# Patient Record
Sex: Male | Born: 1965 | Race: White | Marital: Single | Smoking: Former smoker
Health system: Southern US, Community
[De-identification: ages and names within clinical notes are randomized; demographics above are authoritative.]

## PROBLEM LIST (undated history)

## (undated) DIAGNOSIS — IMO0002 Reserved for concepts with insufficient information to code with codable children: Secondary | ICD-10-CM

## (undated) DIAGNOSIS — G43909 Migraine, unspecified, not intractable, without status migrainosus: Secondary | ICD-10-CM

## (undated) DIAGNOSIS — I10 Essential (primary) hypertension: Secondary | ICD-10-CM

## (undated) DIAGNOSIS — Z5189 Encounter for other specified aftercare: Secondary | ICD-10-CM

## (undated) DIAGNOSIS — K219 Gastro-esophageal reflux disease without esophagitis: Secondary | ICD-10-CM

## (undated) DIAGNOSIS — R55 Syncope and collapse: Secondary | ICD-10-CM

## (undated) DIAGNOSIS — Z973 Presence of spectacles and contact lenses: Secondary | ICD-10-CM

## (undated) DIAGNOSIS — E785 Hyperlipidemia, unspecified: Secondary | ICD-10-CM

## (undated) DIAGNOSIS — M1990 Unspecified osteoarthritis, unspecified site: Secondary | ICD-10-CM

## (undated) DIAGNOSIS — T7840XA Allergy, unspecified, initial encounter: Secondary | ICD-10-CM

## (undated) HISTORY — PX: BACK SURGERY: SHX140

## (undated) HISTORY — DX: Hyperlipidemia, unspecified: E78.5

## (undated) HISTORY — DX: Unspecified osteoarthritis, unspecified site: M19.90

## (undated) HISTORY — DX: Migraine, unspecified, not intractable, without status migrainosus: G43.909

## (undated) HISTORY — DX: Gastro-esophageal reflux disease without esophagitis: K21.9

## (undated) HISTORY — PX: COLONOSCOPY: SHX174

## (undated) HISTORY — DX: Encounter for other specified aftercare: Z51.89

## (undated) HISTORY — DX: Essential (primary) hypertension: I10

## (undated) HISTORY — DX: Syncope and collapse: R55

## (undated) HISTORY — PX: SPINE SURGERY: SHX786

## (undated) HISTORY — DX: Reserved for concepts with insufficient information to code with codable children: IMO0002

## (undated) HISTORY — PX: APPENDECTOMY: SHX54

## (undated) HISTORY — PX: KNEE SURGERY: SHX244

## (undated) HISTORY — DX: Allergy, unspecified, initial encounter: T78.40XA

## (undated) HISTORY — PX: RETINAL DETACHMENT SURGERY: SHX105

## (undated) HISTORY — PX: TOTAL SHOULDER ARTHROPLASTY: SHX126

## (undated) HISTORY — PX: EYE SURGERY: SHX253

## (undated) HISTORY — PX: JOINT REPLACEMENT: SHX530

## (undated) HISTORY — PX: HERNIA REPAIR: SHX51

## (undated) HISTORY — PX: SHOULDER SURGERY: SHX246

## (undated) HISTORY — PX: BRAIN SURGERY: SHX531

## (undated) HISTORY — PX: OTHER SURGICAL HISTORY: SHX169

---

## 2013-07-01 DIAGNOSIS — F909 Attention-deficit hyperactivity disorder, unspecified type: Secondary | ICD-10-CM | POA: Diagnosis not present

## 2013-07-01 DIAGNOSIS — Z981 Arthrodesis status: Secondary | ICD-10-CM | POA: Diagnosis not present

## 2013-07-01 DIAGNOSIS — I10 Essential (primary) hypertension: Secondary | ICD-10-CM | POA: Diagnosis not present

## 2013-07-01 DIAGNOSIS — G43909 Migraine, unspecified, not intractable, without status migrainosus: Secondary | ICD-10-CM | POA: Diagnosis present

## 2013-07-01 DIAGNOSIS — M47817 Spondylosis without myelopathy or radiculopathy, lumbosacral region: Secondary | ICD-10-CM | POA: Diagnosis present

## 2013-07-01 DIAGNOSIS — M545 Low back pain: Secondary | ICD-10-CM | POA: Diagnosis present

## 2013-07-01 DIAGNOSIS — M5126 Other intervertebral disc displacement, lumbar region: Secondary | ICD-10-CM | POA: Diagnosis present

## 2013-07-01 DIAGNOSIS — K219 Gastro-esophageal reflux disease without esophagitis: Secondary | ICD-10-CM | POA: Diagnosis present

## 2013-07-01 DIAGNOSIS — G8929 Other chronic pain: Secondary | ICD-10-CM | POA: Diagnosis not present

## 2013-10-04 DIAGNOSIS — R209 Unspecified disturbances of skin sensation: Secondary | ICD-10-CM | POA: Diagnosis not present

## 2013-10-04 DIAGNOSIS — M545 Low back pain: Secondary | ICD-10-CM | POA: Diagnosis not present

## 2013-10-04 DIAGNOSIS — M25519 Pain in unspecified shoulder: Secondary | ICD-10-CM | POA: Diagnosis not present

## 2013-10-04 DIAGNOSIS — IMO0001 Reserved for inherently not codable concepts without codable children: Secondary | ICD-10-CM | POA: Diagnosis not present

## 2013-10-04 DIAGNOSIS — R5381 Other malaise: Secondary | ICD-10-CM | POA: Diagnosis not present

## 2013-10-04 DIAGNOSIS — R6889 Other general symptoms and signs: Secondary | ICD-10-CM | POA: Diagnosis not present

## 2013-10-04 DIAGNOSIS — R262 Difficulty in walking, not elsewhere classified: Secondary | ICD-10-CM | POA: Diagnosis not present

## 2013-10-04 DIAGNOSIS — R439 Unspecified disturbances of smell and taste: Secondary | ICD-10-CM | POA: Diagnosis not present

## 2013-10-07 DIAGNOSIS — M25519 Pain in unspecified shoulder: Secondary | ICD-10-CM | POA: Diagnosis not present

## 2013-10-07 DIAGNOSIS — M545 Low back pain: Secondary | ICD-10-CM | POA: Diagnosis not present

## 2013-10-07 DIAGNOSIS — IMO0001 Reserved for inherently not codable concepts without codable children: Secondary | ICD-10-CM | POA: Diagnosis not present

## 2013-10-07 DIAGNOSIS — R262 Difficulty in walking, not elsewhere classified: Secondary | ICD-10-CM | POA: Diagnosis not present

## 2013-10-07 DIAGNOSIS — R5381 Other malaise: Secondary | ICD-10-CM | POA: Diagnosis not present

## 2013-10-07 DIAGNOSIS — R6889 Other general symptoms and signs: Secondary | ICD-10-CM | POA: Diagnosis not present

## 2013-10-09 DIAGNOSIS — R6889 Other general symptoms and signs: Secondary | ICD-10-CM | POA: Diagnosis not present

## 2013-10-09 DIAGNOSIS — M545 Low back pain: Secondary | ICD-10-CM | POA: Diagnosis not present

## 2013-10-09 DIAGNOSIS — IMO0001 Reserved for inherently not codable concepts without codable children: Secondary | ICD-10-CM | POA: Diagnosis not present

## 2013-10-09 DIAGNOSIS — R5381 Other malaise: Secondary | ICD-10-CM | POA: Diagnosis not present

## 2013-10-09 DIAGNOSIS — R262 Difficulty in walking, not elsewhere classified: Secondary | ICD-10-CM | POA: Diagnosis not present

## 2013-10-09 DIAGNOSIS — M25519 Pain in unspecified shoulder: Secondary | ICD-10-CM | POA: Diagnosis not present

## 2013-10-10 DIAGNOSIS — M545 Low back pain: Secondary | ICD-10-CM | POA: Diagnosis not present

## 2013-10-10 DIAGNOSIS — R262 Difficulty in walking, not elsewhere classified: Secondary | ICD-10-CM | POA: Diagnosis not present

## 2013-10-10 DIAGNOSIS — IMO0001 Reserved for inherently not codable concepts without codable children: Secondary | ICD-10-CM | POA: Diagnosis not present

## 2013-10-10 DIAGNOSIS — R5381 Other malaise: Secondary | ICD-10-CM | POA: Diagnosis not present

## 2013-10-10 DIAGNOSIS — R6889 Other general symptoms and signs: Secondary | ICD-10-CM | POA: Diagnosis not present

## 2013-10-10 DIAGNOSIS — M25519 Pain in unspecified shoulder: Secondary | ICD-10-CM | POA: Diagnosis not present

## 2013-10-24 DIAGNOSIS — T50902A Poisoning by unspecified drugs, medicaments and biological substances, intentional self-harm, initial encounter: Secondary | ICD-10-CM | POA: Diagnosis not present

## 2013-10-24 DIAGNOSIS — T50901A Poisoning by unspecified drugs, medicaments and biological substances, accidental (unintentional), initial encounter: Secondary | ICD-10-CM | POA: Diagnosis not present

## 2013-10-24 DIAGNOSIS — F329 Major depressive disorder, single episode, unspecified: Secondary | ICD-10-CM | POA: Diagnosis not present

## 2013-10-24 DIAGNOSIS — T50991A Poisoning by other drugs, medicaments and biological substances, accidental (unintentional), initial encounter: Secondary | ICD-10-CM | POA: Diagnosis not present

## 2013-10-24 DIAGNOSIS — Y9289 Other specified places as the place of occurrence of the external cause: Secondary | ICD-10-CM | POA: Diagnosis not present

## 2013-10-25 DIAGNOSIS — F3289 Other specified depressive episodes: Secondary | ICD-10-CM | POA: Diagnosis not present

## 2013-10-25 DIAGNOSIS — F329 Major depressive disorder, single episode, unspecified: Secondary | ICD-10-CM | POA: Diagnosis not present

## 2013-10-25 DIAGNOSIS — T50901A Poisoning by unspecified drugs, medicaments and biological substances, accidental (unintentional), initial encounter: Secondary | ICD-10-CM | POA: Diagnosis not present

## 2013-10-25 DIAGNOSIS — T6592XA Toxic effect of unspecified substance, intentional self-harm, initial encounter: Secondary | ICD-10-CM | POA: Diagnosis not present

## 2013-10-26 DIAGNOSIS — F329 Major depressive disorder, single episode, unspecified: Secondary | ICD-10-CM | POA: Diagnosis not present

## 2013-10-27 DIAGNOSIS — F329 Major depressive disorder, single episode, unspecified: Secondary | ICD-10-CM | POA: Diagnosis not present

## 2013-10-28 DIAGNOSIS — F329 Major depressive disorder, single episode, unspecified: Secondary | ICD-10-CM | POA: Diagnosis not present

## 2013-10-29 DIAGNOSIS — F319 Bipolar disorder, unspecified: Secondary | ICD-10-CM | POA: Diagnosis not present

## 2013-10-29 DIAGNOSIS — F332 Major depressive disorder, recurrent severe without psychotic features: Secondary | ICD-10-CM | POA: Diagnosis not present

## 2013-11-12 ENCOUNTER — Ambulatory Visit (INDEPENDENT_AMBULATORY_CARE_PROVIDER_SITE_OTHER): Payer: Medicare Other | Admitting: Licensed Clinical Social Worker

## 2013-11-12 DIAGNOSIS — F3189 Other bipolar disorder: Secondary | ICD-10-CM

## 2013-11-19 ENCOUNTER — Ambulatory Visit: Payer: Self-pay | Admitting: Licensed Clinical Social Worker

## 2013-11-26 ENCOUNTER — Ambulatory Visit (INDEPENDENT_AMBULATORY_CARE_PROVIDER_SITE_OTHER): Payer: Medicare Other | Admitting: Licensed Clinical Social Worker

## 2013-11-26 DIAGNOSIS — B9789 Other viral agents as the cause of diseases classified elsewhere: Secondary | ICD-10-CM | POA: Diagnosis not present

## 2013-11-26 DIAGNOSIS — F3189 Other bipolar disorder: Secondary | ICD-10-CM

## 2013-11-26 DIAGNOSIS — J019 Acute sinusitis, unspecified: Secondary | ICD-10-CM | POA: Diagnosis not present

## 2013-12-03 ENCOUNTER — Ambulatory Visit (INDEPENDENT_AMBULATORY_CARE_PROVIDER_SITE_OTHER): Payer: Medicare Other | Admitting: Licensed Clinical Social Worker

## 2013-12-03 DIAGNOSIS — F3189 Other bipolar disorder: Secondary | ICD-10-CM | POA: Diagnosis not present

## 2013-12-12 ENCOUNTER — Ambulatory Visit: Payer: BC Managed Care – PPO | Admitting: Licensed Clinical Social Worker

## 2013-12-12 ENCOUNTER — Ambulatory Visit: Payer: BC Managed Care – PPO | Admitting: Family

## 2013-12-13 DIAGNOSIS — M25519 Pain in unspecified shoulder: Secondary | ICD-10-CM | POA: Diagnosis not present

## 2013-12-17 DIAGNOSIS — L570 Actinic keratosis: Secondary | ICD-10-CM | POA: Diagnosis not present

## 2013-12-17 DIAGNOSIS — D235 Other benign neoplasm of skin of trunk: Secondary | ICD-10-CM | POA: Diagnosis not present

## 2013-12-17 DIAGNOSIS — L578 Other skin changes due to chronic exposure to nonionizing radiation: Secondary | ICD-10-CM | POA: Diagnosis not present

## 2013-12-17 DIAGNOSIS — L708 Other acne: Secondary | ICD-10-CM | POA: Diagnosis not present

## 2013-12-18 ENCOUNTER — Ambulatory Visit: Payer: BC Managed Care – PPO | Admitting: Family

## 2013-12-26 DIAGNOSIS — M1990 Unspecified osteoarthritis, unspecified site: Secondary | ICD-10-CM | POA: Diagnosis not present

## 2013-12-26 DIAGNOSIS — M549 Dorsalgia, unspecified: Secondary | ICD-10-CM | POA: Diagnosis not present

## 2014-01-05 DIAGNOSIS — M549 Dorsalgia, unspecified: Secondary | ICD-10-CM | POA: Diagnosis not present

## 2014-01-05 DIAGNOSIS — M1990 Unspecified osteoarthritis, unspecified site: Secondary | ICD-10-CM | POA: Diagnosis not present

## 2014-01-06 DIAGNOSIS — R5381 Other malaise: Secondary | ICD-10-CM | POA: Diagnosis not present

## 2014-01-06 DIAGNOSIS — R5383 Other fatigue: Secondary | ICD-10-CM | POA: Diagnosis not present

## 2014-01-06 DIAGNOSIS — Z125 Encounter for screening for malignant neoplasm of prostate: Secondary | ICD-10-CM | POA: Diagnosis not present

## 2014-01-06 DIAGNOSIS — R197 Diarrhea, unspecified: Secondary | ICD-10-CM | POA: Diagnosis not present

## 2014-01-06 DIAGNOSIS — E291 Testicular hypofunction: Secondary | ICD-10-CM | POA: Diagnosis not present

## 2014-01-06 DIAGNOSIS — E559 Vitamin D deficiency, unspecified: Secondary | ICD-10-CM | POA: Diagnosis not present

## 2014-01-06 DIAGNOSIS — Z0000 Encounter for general adult medical examination without abnormal findings: Secondary | ICD-10-CM | POA: Diagnosis not present

## 2014-01-11 DIAGNOSIS — M47817 Spondylosis without myelopathy or radiculopathy, lumbosacral region: Secondary | ICD-10-CM | POA: Diagnosis not present

## 2014-01-11 DIAGNOSIS — M4802 Spinal stenosis, cervical region: Secondary | ICD-10-CM | POA: Diagnosis not present

## 2014-01-11 DIAGNOSIS — M48061 Spinal stenosis, lumbar region without neurogenic claudication: Secondary | ICD-10-CM | POA: Diagnosis not present

## 2014-01-13 ENCOUNTER — Ambulatory Visit: Payer: BC Managed Care – PPO | Admitting: Family

## 2014-01-15 DIAGNOSIS — M5126 Other intervertebral disc displacement, lumbar region: Secondary | ICD-10-CM | POA: Diagnosis not present

## 2014-01-15 DIAGNOSIS — M48061 Spinal stenosis, lumbar region without neurogenic claudication: Secondary | ICD-10-CM | POA: Diagnosis not present

## 2014-01-22 DIAGNOSIS — IMO0002 Reserved for concepts with insufficient information to code with codable children: Secondary | ICD-10-CM | POA: Diagnosis not present

## 2014-01-22 DIAGNOSIS — M545 Low back pain, unspecified: Secondary | ICD-10-CM | POA: Diagnosis not present

## 2014-01-22 DIAGNOSIS — M961 Postlaminectomy syndrome, not elsewhere classified: Secondary | ICD-10-CM | POA: Diagnosis not present

## 2014-01-23 DIAGNOSIS — M25519 Pain in unspecified shoulder: Secondary | ICD-10-CM | POA: Diagnosis not present

## 2014-01-30 DIAGNOSIS — Z01810 Encounter for preprocedural cardiovascular examination: Secondary | ICD-10-CM | POA: Diagnosis not present

## 2014-01-30 DIAGNOSIS — M5126 Other intervertebral disc displacement, lumbar region: Secondary | ICD-10-CM | POA: Diagnosis not present

## 2014-01-30 DIAGNOSIS — Z9889 Other specified postprocedural states: Secondary | ICD-10-CM | POA: Diagnosis not present

## 2014-02-04 DIAGNOSIS — M5126 Other intervertebral disc displacement, lumbar region: Secondary | ICD-10-CM | POA: Diagnosis not present

## 2014-02-04 DIAGNOSIS — Z9889 Other specified postprocedural states: Secondary | ICD-10-CM | POA: Diagnosis not present

## 2014-02-04 DIAGNOSIS — M545 Low back pain, unspecified: Secondary | ICD-10-CM | POA: Diagnosis not present

## 2014-02-04 DIAGNOSIS — Z01810 Encounter for preprocedural cardiovascular examination: Secondary | ICD-10-CM | POA: Diagnosis not present

## 2014-02-04 DIAGNOSIS — M48061 Spinal stenosis, lumbar region without neurogenic claudication: Secondary | ICD-10-CM | POA: Diagnosis not present

## 2014-02-05 DIAGNOSIS — Z9889 Other specified postprocedural states: Secondary | ICD-10-CM | POA: Diagnosis not present

## 2014-02-05 DIAGNOSIS — Z01810 Encounter for preprocedural cardiovascular examination: Secondary | ICD-10-CM | POA: Diagnosis not present

## 2014-02-05 DIAGNOSIS — M5126 Other intervertebral disc displacement, lumbar region: Secondary | ICD-10-CM | POA: Diagnosis not present

## 2014-02-07 DIAGNOSIS — M545 Low back pain, unspecified: Secondary | ICD-10-CM | POA: Diagnosis not present

## 2014-02-07 DIAGNOSIS — M961 Postlaminectomy syndrome, not elsewhere classified: Secondary | ICD-10-CM | POA: Diagnosis not present

## 2014-02-07 DIAGNOSIS — M5126 Other intervertebral disc displacement, lumbar region: Secondary | ICD-10-CM | POA: Diagnosis not present

## 2014-02-07 DIAGNOSIS — IMO0002 Reserved for concepts with insufficient information to code with codable children: Secondary | ICD-10-CM | POA: Diagnosis not present

## 2014-02-21 DIAGNOSIS — M545 Low back pain, unspecified: Secondary | ICD-10-CM | POA: Diagnosis not present

## 2014-02-21 DIAGNOSIS — R45851 Suicidal ideations: Secondary | ICD-10-CM | POA: Diagnosis not present

## 2014-02-21 DIAGNOSIS — G8929 Other chronic pain: Secondary | ICD-10-CM | POA: Diagnosis not present

## 2014-02-21 DIAGNOSIS — IMO0002 Reserved for concepts with insufficient information to code with codable children: Secondary | ICD-10-CM | POA: Diagnosis not present

## 2014-02-21 DIAGNOSIS — M961 Postlaminectomy syndrome, not elsewhere classified: Secondary | ICD-10-CM | POA: Diagnosis not present

## 2014-02-21 DIAGNOSIS — M5126 Other intervertebral disc displacement, lumbar region: Secondary | ICD-10-CM | POA: Diagnosis not present

## 2014-03-12 DIAGNOSIS — M19019 Primary osteoarthritis, unspecified shoulder: Secondary | ICD-10-CM | POA: Diagnosis not present

## 2014-03-12 DIAGNOSIS — Z01812 Encounter for preprocedural laboratory examination: Secondary | ICD-10-CM | POA: Diagnosis not present

## 2014-03-17 DIAGNOSIS — T84019A Broken internal joint prosthesis, unspecified site, initial encounter: Secondary | ICD-10-CM | POA: Diagnosis not present

## 2014-03-17 DIAGNOSIS — Z471 Aftercare following joint replacement surgery: Secondary | ICD-10-CM | POA: Diagnosis not present

## 2014-03-17 DIAGNOSIS — Z96619 Presence of unspecified artificial shoulder joint: Secondary | ICD-10-CM | POA: Diagnosis not present

## 2014-03-17 DIAGNOSIS — T84039A Mechanical loosening of unspecified internal prosthetic joint, initial encounter: Secondary | ICD-10-CM | POA: Diagnosis present

## 2014-03-17 DIAGNOSIS — M19019 Primary osteoarthritis, unspecified shoulder: Secondary | ICD-10-CM | POA: Diagnosis present

## 2014-03-17 DIAGNOSIS — F17200 Nicotine dependence, unspecified, uncomplicated: Secondary | ICD-10-CM | POA: Diagnosis present

## 2014-03-17 DIAGNOSIS — T847XXA Infection and inflammatory reaction due to other internal orthopedic prosthetic devices, implants and grafts, initial encounter: Secondary | ICD-10-CM | POA: Diagnosis not present

## 2014-03-17 DIAGNOSIS — G8918 Other acute postprocedural pain: Secondary | ICD-10-CM | POA: Diagnosis not present

## 2014-03-17 DIAGNOSIS — M25519 Pain in unspecified shoulder: Secondary | ICD-10-CM | POA: Diagnosis not present

## 2014-03-20 DIAGNOSIS — M19019 Primary osteoarthritis, unspecified shoulder: Secondary | ICD-10-CM | POA: Diagnosis not present

## 2014-03-20 DIAGNOSIS — Z471 Aftercare following joint replacement surgery: Secondary | ICD-10-CM | POA: Diagnosis not present

## 2014-03-20 DIAGNOSIS — Z96619 Presence of unspecified artificial shoulder joint: Secondary | ICD-10-CM | POA: Diagnosis not present

## 2014-03-20 DIAGNOSIS — M25519 Pain in unspecified shoulder: Secondary | ICD-10-CM | POA: Diagnosis not present

## 2014-03-21 DIAGNOSIS — M19019 Primary osteoarthritis, unspecified shoulder: Secondary | ICD-10-CM | POA: Diagnosis not present

## 2014-03-21 DIAGNOSIS — Z471 Aftercare following joint replacement surgery: Secondary | ICD-10-CM | POA: Diagnosis not present

## 2014-03-21 DIAGNOSIS — Z96619 Presence of unspecified artificial shoulder joint: Secondary | ICD-10-CM | POA: Diagnosis not present

## 2014-03-21 DIAGNOSIS — M25519 Pain in unspecified shoulder: Secondary | ICD-10-CM | POA: Diagnosis not present

## 2014-03-25 DIAGNOSIS — M19019 Primary osteoarthritis, unspecified shoulder: Secondary | ICD-10-CM | POA: Diagnosis not present

## 2014-04-07 DIAGNOSIS — M961 Postlaminectomy syndrome, not elsewhere classified: Secondary | ICD-10-CM | POA: Diagnosis not present

## 2014-04-07 DIAGNOSIS — G894 Chronic pain syndrome: Secondary | ICD-10-CM | POA: Diagnosis not present

## 2014-04-07 DIAGNOSIS — M1710 Unilateral primary osteoarthritis, unspecified knee: Secondary | ICD-10-CM | POA: Diagnosis not present

## 2014-04-07 DIAGNOSIS — M25519 Pain in unspecified shoulder: Secondary | ICD-10-CM | POA: Diagnosis not present

## 2014-04-07 DIAGNOSIS — Z79899 Other long term (current) drug therapy: Secondary | ICD-10-CM | POA: Diagnosis not present

## 2014-04-16 DIAGNOSIS — M129 Arthropathy, unspecified: Secondary | ICD-10-CM | POA: Diagnosis not present

## 2014-04-16 DIAGNOSIS — M25519 Pain in unspecified shoulder: Secondary | ICD-10-CM | POA: Diagnosis not present

## 2014-04-16 DIAGNOSIS — M1710 Unilateral primary osteoarthritis, unspecified knee: Secondary | ICD-10-CM | POA: Diagnosis not present

## 2014-04-16 DIAGNOSIS — M961 Postlaminectomy syndrome, not elsewhere classified: Secondary | ICD-10-CM | POA: Diagnosis not present

## 2014-04-29 DIAGNOSIS — M19019 Primary osteoarthritis, unspecified shoulder: Secondary | ICD-10-CM | POA: Diagnosis not present

## 2014-05-01 DIAGNOSIS — M545 Low back pain, unspecified: Secondary | ICD-10-CM | POA: Diagnosis not present

## 2014-05-01 DIAGNOSIS — M48061 Spinal stenosis, lumbar region without neurogenic claudication: Secondary | ICD-10-CM | POA: Diagnosis not present

## 2014-05-01 DIAGNOSIS — M47817 Spondylosis without myelopathy or radiculopathy, lumbosacral region: Secondary | ICD-10-CM | POA: Diagnosis not present

## 2014-05-08 DIAGNOSIS — M5126 Other intervertebral disc displacement, lumbar region: Secondary | ICD-10-CM | POA: Diagnosis not present

## 2014-05-08 DIAGNOSIS — Z981 Arthrodesis status: Secondary | ICD-10-CM | POA: Diagnosis not present

## 2014-05-08 DIAGNOSIS — Z01818 Encounter for other preprocedural examination: Secondary | ICD-10-CM | POA: Diagnosis not present

## 2014-05-13 DIAGNOSIS — I10 Essential (primary) hypertension: Secondary | ICD-10-CM | POA: Diagnosis present

## 2014-05-13 DIAGNOSIS — Z01818 Encounter for other preprocedural examination: Secondary | ICD-10-CM | POA: Diagnosis not present

## 2014-05-13 DIAGNOSIS — Z981 Arthrodesis status: Secondary | ICD-10-CM | POA: Diagnosis not present

## 2014-05-13 DIAGNOSIS — M5126 Other intervertebral disc displacement, lumbar region: Secondary | ICD-10-CM | POA: Diagnosis not present

## 2014-05-13 DIAGNOSIS — K219 Gastro-esophageal reflux disease without esophagitis: Secondary | ICD-10-CM | POA: Diagnosis present

## 2014-05-13 DIAGNOSIS — M51379 Other intervertebral disc degeneration, lumbosacral region without mention of lumbar back pain or lower extremity pain: Secondary | ICD-10-CM | POA: Diagnosis present

## 2014-05-13 DIAGNOSIS — M4120 Other idiopathic scoliosis, site unspecified: Secondary | ICD-10-CM | POA: Diagnosis not present

## 2014-05-13 DIAGNOSIS — Z472 Encounter for removal of internal fixation device: Secondary | ICD-10-CM | POA: Diagnosis not present

## 2014-05-13 DIAGNOSIS — R110 Nausea: Secondary | ICD-10-CM | POA: Diagnosis not present

## 2014-05-13 DIAGNOSIS — H7190 Unspecified cholesteatoma, unspecified ear: Secondary | ICD-10-CM | POA: Diagnosis not present

## 2014-05-13 DIAGNOSIS — F909 Attention-deficit hyperactivity disorder, unspecified type: Secondary | ICD-10-CM | POA: Diagnosis present

## 2014-05-13 DIAGNOSIS — R339 Retention of urine, unspecified: Secondary | ICD-10-CM | POA: Diagnosis not present

## 2014-05-13 DIAGNOSIS — M5137 Other intervertebral disc degeneration, lumbosacral region: Secondary | ICD-10-CM | POA: Diagnosis not present

## 2014-05-13 DIAGNOSIS — H9209 Otalgia, unspecified ear: Secondary | ICD-10-CM | POA: Diagnosis not present

## 2014-05-13 DIAGNOSIS — M47817 Spondylosis without myelopathy or radiculopathy, lumbosacral region: Secondary | ICD-10-CM | POA: Diagnosis not present

## 2014-05-13 DIAGNOSIS — M4000 Postural kyphosis, site unspecified: Secondary | ICD-10-CM | POA: Diagnosis not present

## 2014-05-13 DIAGNOSIS — Z87891 Personal history of nicotine dependence: Secondary | ICD-10-CM | POA: Diagnosis not present

## 2014-05-13 DIAGNOSIS — M48061 Spinal stenosis, lumbar region without neurogenic claudication: Secondary | ICD-10-CM | POA: Diagnosis not present

## 2014-06-09 DIAGNOSIS — IMO0002 Reserved for concepts with insufficient information to code with codable children: Secondary | ICD-10-CM | POA: Diagnosis not present

## 2014-06-09 DIAGNOSIS — G894 Chronic pain syndrome: Secondary | ICD-10-CM | POA: Diagnosis not present

## 2014-06-09 DIAGNOSIS — M961 Postlaminectomy syndrome, not elsewhere classified: Secondary | ICD-10-CM | POA: Diagnosis not present

## 2014-06-17 DIAGNOSIS — M19019 Primary osteoarthritis, unspecified shoulder: Secondary | ICD-10-CM | POA: Diagnosis not present

## 2014-06-25 DIAGNOSIS — M5137 Other intervertebral disc degeneration, lumbosacral region: Secondary | ICD-10-CM | POA: Diagnosis not present

## 2014-06-25 DIAGNOSIS — M47817 Spondylosis without myelopathy or radiculopathy, lumbosacral region: Secondary | ICD-10-CM | POA: Diagnosis not present

## 2014-06-25 DIAGNOSIS — Z981 Arthrodesis status: Secondary | ICD-10-CM | POA: Diagnosis not present

## 2014-07-02 DIAGNOSIS — M545 Low back pain, unspecified: Secondary | ICD-10-CM | POA: Diagnosis not present

## 2014-07-02 DIAGNOSIS — F909 Attention-deficit hyperactivity disorder, unspecified type: Secondary | ICD-10-CM | POA: Diagnosis not present

## 2014-07-02 DIAGNOSIS — M539 Dorsopathy, unspecified: Secondary | ICD-10-CM | POA: Diagnosis not present

## 2014-07-02 DIAGNOSIS — Z96619 Presence of unspecified artificial shoulder joint: Secondary | ICD-10-CM | POA: Diagnosis not present

## 2014-07-07 DIAGNOSIS — M25519 Pain in unspecified shoulder: Secondary | ICD-10-CM | POA: Diagnosis not present

## 2014-07-07 DIAGNOSIS — M961 Postlaminectomy syndrome, not elsewhere classified: Secondary | ICD-10-CM | POA: Diagnosis not present

## 2014-07-07 DIAGNOSIS — IMO0002 Reserved for concepts with insufficient information to code with codable children: Secondary | ICD-10-CM | POA: Diagnosis not present

## 2014-07-07 DIAGNOSIS — M1710 Unilateral primary osteoarthritis, unspecified knee: Secondary | ICD-10-CM | POA: Diagnosis not present

## 2014-07-31 DIAGNOSIS — M2240 Chondromalacia patellae, unspecified knee: Secondary | ICD-10-CM | POA: Insufficient documentation

## 2014-07-31 DIAGNOSIS — M25469 Effusion, unspecified knee: Secondary | ICD-10-CM | POA: Diagnosis not present

## 2014-07-31 DIAGNOSIS — M25569 Pain in unspecified knee: Secondary | ICD-10-CM | POA: Diagnosis not present

## 2014-08-01 DIAGNOSIS — M5126 Other intervertebral disc displacement, lumbar region: Secondary | ICD-10-CM | POA: Diagnosis not present

## 2014-08-04 DIAGNOSIS — G894 Chronic pain syndrome: Secondary | ICD-10-CM | POA: Diagnosis not present

## 2014-08-04 DIAGNOSIS — M1710 Unilateral primary osteoarthritis, unspecified knee: Secondary | ICD-10-CM | POA: Diagnosis not present

## 2014-08-04 DIAGNOSIS — M961 Postlaminectomy syndrome, not elsewhere classified: Secondary | ICD-10-CM | POA: Diagnosis not present

## 2014-08-04 DIAGNOSIS — M25519 Pain in unspecified shoulder: Secondary | ICD-10-CM | POA: Diagnosis not present

## 2014-08-06 DIAGNOSIS — M5126 Other intervertebral disc displacement, lumbar region: Secondary | ICD-10-CM | POA: Diagnosis not present

## 2014-08-07 DIAGNOSIS — M7540 Impingement syndrome of unspecified shoulder: Secondary | ICD-10-CM | POA: Insufficient documentation

## 2014-08-07 DIAGNOSIS — M25819 Other specified joint disorders, unspecified shoulder: Secondary | ICD-10-CM | POA: Diagnosis not present

## 2014-08-07 DIAGNOSIS — M25519 Pain in unspecified shoulder: Secondary | ICD-10-CM | POA: Diagnosis not present

## 2014-08-07 DIAGNOSIS — M259 Joint disorder, unspecified: Secondary | ICD-10-CM | POA: Diagnosis not present

## 2014-08-08 DIAGNOSIS — M5126 Other intervertebral disc displacement, lumbar region: Secondary | ICD-10-CM | POA: Diagnosis not present

## 2014-08-11 DIAGNOSIS — M5126 Other intervertebral disc displacement, lumbar region: Secondary | ICD-10-CM | POA: Diagnosis not present

## 2014-08-13 DIAGNOSIS — M2240 Chondromalacia patellae, unspecified knee: Secondary | ICD-10-CM | POA: Diagnosis not present

## 2014-08-13 DIAGNOSIS — M961 Postlaminectomy syndrome, not elsewhere classified: Secondary | ICD-10-CM | POA: Diagnosis not present

## 2014-08-13 DIAGNOSIS — M25819 Other specified joint disorders, unspecified shoulder: Secondary | ICD-10-CM | POA: Diagnosis not present

## 2014-08-13 DIAGNOSIS — G894 Chronic pain syndrome: Secondary | ICD-10-CM | POA: Diagnosis not present

## 2014-08-13 DIAGNOSIS — M5126 Other intervertebral disc displacement, lumbar region: Secondary | ICD-10-CM | POA: Diagnosis not present

## 2014-08-18 DIAGNOSIS — M5126 Other intervertebral disc displacement, lumbar region: Secondary | ICD-10-CM | POA: Diagnosis not present

## 2014-08-18 DIAGNOSIS — M25819 Other specified joint disorders, unspecified shoulder: Secondary | ICD-10-CM | POA: Diagnosis not present

## 2014-08-18 DIAGNOSIS — M2240 Chondromalacia patellae, unspecified knee: Secondary | ICD-10-CM | POA: Diagnosis not present

## 2014-08-21 DIAGNOSIS — M7540 Impingement syndrome of unspecified shoulder: Secondary | ICD-10-CM | POA: Diagnosis not present

## 2014-08-21 DIAGNOSIS — M5126 Other intervertebral disc displacement, lumbar region: Secondary | ICD-10-CM | POA: Diagnosis not present

## 2014-08-21 DIAGNOSIS — M2240 Chondromalacia patellae, unspecified knee: Secondary | ICD-10-CM | POA: Diagnosis not present

## 2014-08-25 DIAGNOSIS — M2240 Chondromalacia patellae, unspecified knee: Secondary | ICD-10-CM | POA: Diagnosis not present

## 2014-08-25 DIAGNOSIS — M5126 Other intervertebral disc displacement, lumbar region: Secondary | ICD-10-CM | POA: Diagnosis not present

## 2014-08-25 DIAGNOSIS — M7540 Impingement syndrome of unspecified shoulder: Secondary | ICD-10-CM | POA: Diagnosis not present

## 2014-08-27 DIAGNOSIS — M25511 Pain in right shoulder: Secondary | ICD-10-CM | POA: Diagnosis not present

## 2014-08-27 DIAGNOSIS — M961 Postlaminectomy syndrome, not elsewhere classified: Secondary | ICD-10-CM | POA: Diagnosis not present

## 2014-08-27 DIAGNOSIS — Z79891 Long term (current) use of opiate analgesic: Secondary | ICD-10-CM | POA: Diagnosis not present

## 2014-08-27 DIAGNOSIS — M2240 Chondromalacia patellae, unspecified knee: Secondary | ICD-10-CM | POA: Diagnosis not present

## 2014-08-27 DIAGNOSIS — G894 Chronic pain syndrome: Secondary | ICD-10-CM | POA: Diagnosis not present

## 2014-08-27 DIAGNOSIS — M5126 Other intervertebral disc displacement, lumbar region: Secondary | ICD-10-CM | POA: Diagnosis not present

## 2014-08-27 DIAGNOSIS — M7540 Impingement syndrome of unspecified shoulder: Secondary | ICD-10-CM | POA: Diagnosis not present

## 2014-09-02 DIAGNOSIS — M19012 Primary osteoarthritis, left shoulder: Secondary | ICD-10-CM | POA: Diagnosis not present

## 2014-09-03 DIAGNOSIS — M5126 Other intervertebral disc displacement, lumbar region: Secondary | ICD-10-CM | POA: Diagnosis not present

## 2014-09-03 DIAGNOSIS — M47816 Spondylosis without myelopathy or radiculopathy, lumbar region: Secondary | ICD-10-CM | POA: Diagnosis not present

## 2014-09-10 DIAGNOSIS — Z981 Arthrodesis status: Secondary | ICD-10-CM | POA: Diagnosis not present

## 2014-09-10 DIAGNOSIS — M47896 Other spondylosis, lumbar region: Secondary | ICD-10-CM | POA: Diagnosis not present

## 2014-09-10 DIAGNOSIS — M5126 Other intervertebral disc displacement, lumbar region: Secondary | ICD-10-CM | POA: Diagnosis not present

## 2014-09-16 DIAGNOSIS — G894 Chronic pain syndrome: Secondary | ICD-10-CM | POA: Diagnosis not present

## 2014-09-16 DIAGNOSIS — M461 Sacroiliitis, not elsewhere classified: Secondary | ICD-10-CM | POA: Diagnosis not present

## 2014-09-16 DIAGNOSIS — M961 Postlaminectomy syndrome, not elsewhere classified: Secondary | ICD-10-CM | POA: Diagnosis not present

## 2014-09-16 DIAGNOSIS — M5126 Other intervertebral disc displacement, lumbar region: Secondary | ICD-10-CM | POA: Diagnosis not present

## 2014-09-16 DIAGNOSIS — M25511 Pain in right shoulder: Secondary | ICD-10-CM | POA: Diagnosis not present

## 2014-09-16 DIAGNOSIS — M2240 Chondromalacia patellae, unspecified knee: Secondary | ICD-10-CM | POA: Diagnosis not present

## 2014-09-16 DIAGNOSIS — M7540 Impingement syndrome of unspecified shoulder: Secondary | ICD-10-CM | POA: Diagnosis not present

## 2014-09-19 DIAGNOSIS — M2240 Chondromalacia patellae, unspecified knee: Secondary | ICD-10-CM | POA: Diagnosis not present

## 2014-09-19 DIAGNOSIS — M7540 Impingement syndrome of unspecified shoulder: Secondary | ICD-10-CM | POA: Diagnosis not present

## 2014-09-19 DIAGNOSIS — M5126 Other intervertebral disc displacement, lumbar region: Secondary | ICD-10-CM | POA: Diagnosis not present

## 2014-09-22 DIAGNOSIS — M5126 Other intervertebral disc displacement, lumbar region: Secondary | ICD-10-CM | POA: Diagnosis not present

## 2014-09-22 DIAGNOSIS — M7540 Impingement syndrome of unspecified shoulder: Secondary | ICD-10-CM | POA: Diagnosis not present

## 2014-09-22 DIAGNOSIS — M2240 Chondromalacia patellae, unspecified knee: Secondary | ICD-10-CM | POA: Diagnosis not present

## 2014-09-25 DIAGNOSIS — M5126 Other intervertebral disc displacement, lumbar region: Secondary | ICD-10-CM | POA: Diagnosis not present

## 2014-09-25 DIAGNOSIS — M7540 Impingement syndrome of unspecified shoulder: Secondary | ICD-10-CM | POA: Diagnosis not present

## 2014-09-25 DIAGNOSIS — M2240 Chondromalacia patellae, unspecified knee: Secondary | ICD-10-CM | POA: Diagnosis not present

## 2014-09-29 DIAGNOSIS — G894 Chronic pain syndrome: Secondary | ICD-10-CM | POA: Diagnosis not present

## 2014-09-29 DIAGNOSIS — M961 Postlaminectomy syndrome, not elsewhere classified: Secondary | ICD-10-CM | POA: Diagnosis not present

## 2014-09-29 DIAGNOSIS — Z79891 Long term (current) use of opiate analgesic: Secondary | ICD-10-CM | POA: Diagnosis not present

## 2014-09-29 DIAGNOSIS — M461 Sacroiliitis, not elsewhere classified: Secondary | ICD-10-CM | POA: Diagnosis not present

## 2014-09-30 DIAGNOSIS — M7540 Impingement syndrome of unspecified shoulder: Secondary | ICD-10-CM | POA: Diagnosis not present

## 2014-09-30 DIAGNOSIS — M5126 Other intervertebral disc displacement, lumbar region: Secondary | ICD-10-CM | POA: Diagnosis not present

## 2014-09-30 DIAGNOSIS — M2240 Chondromalacia patellae, unspecified knee: Secondary | ICD-10-CM | POA: Diagnosis not present

## 2014-10-08 DIAGNOSIS — M5126 Other intervertebral disc displacement, lumbar region: Secondary | ICD-10-CM | POA: Diagnosis not present

## 2014-10-08 DIAGNOSIS — M2240 Chondromalacia patellae, unspecified knee: Secondary | ICD-10-CM | POA: Diagnosis not present

## 2014-10-08 DIAGNOSIS — M7540 Impingement syndrome of unspecified shoulder: Secondary | ICD-10-CM | POA: Diagnosis not present

## 2014-10-10 DIAGNOSIS — M2240 Chondromalacia patellae, unspecified knee: Secondary | ICD-10-CM | POA: Diagnosis not present

## 2014-10-10 DIAGNOSIS — M5126 Other intervertebral disc displacement, lumbar region: Secondary | ICD-10-CM | POA: Diagnosis not present

## 2014-10-10 DIAGNOSIS — M7540 Impingement syndrome of unspecified shoulder: Secondary | ICD-10-CM | POA: Diagnosis not present

## 2014-10-11 DIAGNOSIS — H9209 Otalgia, unspecified ear: Secondary | ICD-10-CM | POA: Diagnosis not present

## 2014-10-20 DIAGNOSIS — M2240 Chondromalacia patellae, unspecified knee: Secondary | ICD-10-CM | POA: Diagnosis not present

## 2014-10-20 DIAGNOSIS — M7540 Impingement syndrome of unspecified shoulder: Secondary | ICD-10-CM | POA: Diagnosis not present

## 2014-10-20 DIAGNOSIS — M5126 Other intervertebral disc displacement, lumbar region: Secondary | ICD-10-CM | POA: Diagnosis not present

## 2014-10-22 DIAGNOSIS — M2240 Chondromalacia patellae, unspecified knee: Secondary | ICD-10-CM | POA: Diagnosis not present

## 2014-10-22 DIAGNOSIS — M7540 Impingement syndrome of unspecified shoulder: Secondary | ICD-10-CM | POA: Diagnosis not present

## 2014-10-22 DIAGNOSIS — M5126 Other intervertebral disc displacement, lumbar region: Secondary | ICD-10-CM | POA: Diagnosis not present

## 2014-10-27 DIAGNOSIS — M7540 Impingement syndrome of unspecified shoulder: Secondary | ICD-10-CM | POA: Diagnosis not present

## 2014-10-27 DIAGNOSIS — M5126 Other intervertebral disc displacement, lumbar region: Secondary | ICD-10-CM | POA: Diagnosis not present

## 2014-10-27 DIAGNOSIS — M2240 Chondromalacia patellae, unspecified knee: Secondary | ICD-10-CM | POA: Diagnosis not present

## 2014-10-28 DIAGNOSIS — G894 Chronic pain syndrome: Secondary | ICD-10-CM | POA: Diagnosis not present

## 2014-10-28 DIAGNOSIS — Z79891 Long term (current) use of opiate analgesic: Secondary | ICD-10-CM | POA: Diagnosis not present

## 2014-10-28 DIAGNOSIS — M961 Postlaminectomy syndrome, not elsewhere classified: Secondary | ICD-10-CM | POA: Diagnosis not present

## 2014-10-28 DIAGNOSIS — M461 Sacroiliitis, not elsewhere classified: Secondary | ICD-10-CM | POA: Diagnosis not present

## 2014-10-29 DIAGNOSIS — M2240 Chondromalacia patellae, unspecified knee: Secondary | ICD-10-CM | POA: Diagnosis not present

## 2014-10-29 DIAGNOSIS — M7540 Impingement syndrome of unspecified shoulder: Secondary | ICD-10-CM | POA: Diagnosis not present

## 2014-10-29 DIAGNOSIS — M5126 Other intervertebral disc displacement, lumbar region: Secondary | ICD-10-CM | POA: Diagnosis not present

## 2014-11-05 DIAGNOSIS — M5126 Other intervertebral disc displacement, lumbar region: Secondary | ICD-10-CM | POA: Diagnosis not present

## 2014-11-05 DIAGNOSIS — M2240 Chondromalacia patellae, unspecified knee: Secondary | ICD-10-CM | POA: Diagnosis not present

## 2014-11-05 DIAGNOSIS — M7540 Impingement syndrome of unspecified shoulder: Secondary | ICD-10-CM | POA: Diagnosis not present

## 2014-11-07 DIAGNOSIS — M7540 Impingement syndrome of unspecified shoulder: Secondary | ICD-10-CM | POA: Diagnosis not present

## 2014-11-07 DIAGNOSIS — M5126 Other intervertebral disc displacement, lumbar region: Secondary | ICD-10-CM | POA: Diagnosis not present

## 2014-11-07 DIAGNOSIS — M2240 Chondromalacia patellae, unspecified knee: Secondary | ICD-10-CM | POA: Diagnosis not present

## 2014-11-12 DIAGNOSIS — G894 Chronic pain syndrome: Secondary | ICD-10-CM | POA: Diagnosis not present

## 2014-11-12 DIAGNOSIS — Z79891 Long term (current) use of opiate analgesic: Secondary | ICD-10-CM | POA: Diagnosis not present

## 2014-11-12 DIAGNOSIS — M961 Postlaminectomy syndrome, not elsewhere classified: Secondary | ICD-10-CM | POA: Diagnosis not present

## 2014-11-12 DIAGNOSIS — M546 Pain in thoracic spine: Secondary | ICD-10-CM | POA: Diagnosis not present

## 2014-11-17 DIAGNOSIS — M2240 Chondromalacia patellae, unspecified knee: Secondary | ICD-10-CM | POA: Diagnosis not present

## 2014-11-17 DIAGNOSIS — M5126 Other intervertebral disc displacement, lumbar region: Secondary | ICD-10-CM | POA: Diagnosis not present

## 2014-11-17 DIAGNOSIS — M7540 Impingement syndrome of unspecified shoulder: Secondary | ICD-10-CM | POA: Diagnosis not present

## 2014-11-27 DIAGNOSIS — Z79891 Long term (current) use of opiate analgesic: Secondary | ICD-10-CM | POA: Diagnosis not present

## 2014-11-27 DIAGNOSIS — M546 Pain in thoracic spine: Secondary | ICD-10-CM | POA: Diagnosis not present

## 2014-11-27 DIAGNOSIS — G894 Chronic pain syndrome: Secondary | ICD-10-CM | POA: Diagnosis not present

## 2014-11-27 DIAGNOSIS — M961 Postlaminectomy syndrome, not elsewhere classified: Secondary | ICD-10-CM | POA: Diagnosis not present

## 2014-12-08 ENCOUNTER — Other Ambulatory Visit: Payer: Self-pay | Admitting: Anesthesiology

## 2014-12-08 DIAGNOSIS — M961 Postlaminectomy syndrome, not elsewhere classified: Secondary | ICD-10-CM | POA: Diagnosis not present

## 2014-12-08 DIAGNOSIS — M461 Sacroiliitis, not elsewhere classified: Secondary | ICD-10-CM | POA: Diagnosis not present

## 2014-12-08 DIAGNOSIS — L729 Follicular cyst of the skin and subcutaneous tissue, unspecified: Secondary | ICD-10-CM

## 2014-12-08 DIAGNOSIS — G894 Chronic pain syndrome: Secondary | ICD-10-CM | POA: Diagnosis not present

## 2014-12-08 DIAGNOSIS — Z79891 Long term (current) use of opiate analgesic: Secondary | ICD-10-CM | POA: Diagnosis not present

## 2014-12-09 ENCOUNTER — Ambulatory Visit
Admission: RE | Admit: 2014-12-09 | Discharge: 2014-12-09 | Disposition: A | Discharge status: Home / Self Care | Payer: Medicare Other | Source: Ambulatory Visit | Attending: Anesthesiology | Admitting: Anesthesiology

## 2014-12-09 DIAGNOSIS — L729 Follicular cyst of the skin and subcutaneous tissue, unspecified: Secondary | ICD-10-CM

## 2014-12-09 DIAGNOSIS — M799 Soft tissue disorder, unspecified: Secondary | ICD-10-CM | POA: Diagnosis not present

## 2014-12-18 DIAGNOSIS — G894 Chronic pain syndrome: Secondary | ICD-10-CM | POA: Diagnosis not present

## 2014-12-18 DIAGNOSIS — M961 Postlaminectomy syndrome, not elsewhere classified: Secondary | ICD-10-CM | POA: Diagnosis not present

## 2014-12-18 DIAGNOSIS — M461 Sacroiliitis, not elsewhere classified: Secondary | ICD-10-CM | POA: Diagnosis not present

## 2014-12-18 DIAGNOSIS — Z79891 Long term (current) use of opiate analgesic: Secondary | ICD-10-CM | POA: Diagnosis not present

## 2014-12-25 DIAGNOSIS — M7540 Impingement syndrome of unspecified shoulder: Secondary | ICD-10-CM | POA: Diagnosis not present

## 2014-12-25 DIAGNOSIS — M2240 Chondromalacia patellae, unspecified knee: Secondary | ICD-10-CM | POA: Diagnosis not present

## 2014-12-25 DIAGNOSIS — M5126 Other intervertebral disc displacement, lumbar region: Secondary | ICD-10-CM | POA: Diagnosis not present

## 2014-12-30 DIAGNOSIS — M7540 Impingement syndrome of unspecified shoulder: Secondary | ICD-10-CM | POA: Diagnosis not present

## 2014-12-30 DIAGNOSIS — M2240 Chondromalacia patellae, unspecified knee: Secondary | ICD-10-CM | POA: Diagnosis not present

## 2014-12-30 DIAGNOSIS — M5126 Other intervertebral disc displacement, lumbar region: Secondary | ICD-10-CM | POA: Diagnosis not present

## 2015-01-13 DIAGNOSIS — M461 Sacroiliitis, not elsewhere classified: Secondary | ICD-10-CM | POA: Diagnosis not present

## 2015-01-13 DIAGNOSIS — M961 Postlaminectomy syndrome, not elsewhere classified: Secondary | ICD-10-CM | POA: Diagnosis not present

## 2015-01-13 DIAGNOSIS — G894 Chronic pain syndrome: Secondary | ICD-10-CM | POA: Diagnosis not present

## 2015-01-13 DIAGNOSIS — Z79891 Long term (current) use of opiate analgesic: Secondary | ICD-10-CM | POA: Diagnosis not present

## 2015-01-19 DIAGNOSIS — G894 Chronic pain syndrome: Secondary | ICD-10-CM | POA: Diagnosis not present

## 2015-01-19 DIAGNOSIS — M961 Postlaminectomy syndrome, not elsewhere classified: Secondary | ICD-10-CM | POA: Diagnosis not present

## 2015-01-19 DIAGNOSIS — Z79891 Long term (current) use of opiate analgesic: Secondary | ICD-10-CM | POA: Diagnosis not present

## 2015-01-19 DIAGNOSIS — M461 Sacroiliitis, not elsewhere classified: Secondary | ICD-10-CM | POA: Diagnosis not present

## 2015-02-02 DIAGNOSIS — M961 Postlaminectomy syndrome, not elsewhere classified: Secondary | ICD-10-CM | POA: Diagnosis not present

## 2015-02-02 DIAGNOSIS — G894 Chronic pain syndrome: Secondary | ICD-10-CM | POA: Diagnosis not present

## 2015-02-02 DIAGNOSIS — M461 Sacroiliitis, not elsewhere classified: Secondary | ICD-10-CM | POA: Diagnosis not present

## 2015-02-02 DIAGNOSIS — Z79891 Long term (current) use of opiate analgesic: Secondary | ICD-10-CM | POA: Diagnosis not present

## 2015-02-16 ENCOUNTER — Ambulatory Visit (INDEPENDENT_AMBULATORY_CARE_PROVIDER_SITE_OTHER): Payer: Medicare Other | Admitting: Family

## 2015-02-16 ENCOUNTER — Ambulatory Visit (INDEPENDENT_AMBULATORY_CARE_PROVIDER_SITE_OTHER)
Admission: RE | Admit: 2015-02-16 | Discharge: 2015-02-16 | Disposition: A | Discharge status: Home / Self Care | Payer: Medicare Other | Source: Ambulatory Visit | Attending: Family | Admitting: Family

## 2015-02-16 ENCOUNTER — Encounter: Payer: Self-pay | Admitting: Family

## 2015-02-16 VITALS — BP 164/100 | HR 76 | Temp 98.2°F | Resp 18 | Wt 185.0 lb

## 2015-02-16 DIAGNOSIS — M545 Low back pain, unspecified: Secondary | ICD-10-CM | POA: Insufficient documentation

## 2015-02-16 DIAGNOSIS — S299XXA Unspecified injury of thorax, initial encounter: Secondary | ICD-10-CM | POA: Diagnosis not present

## 2015-02-16 DIAGNOSIS — M5441 Lumbago with sciatica, right side: Secondary | ICD-10-CM

## 2015-02-16 DIAGNOSIS — I10 Essential (primary) hypertension: Secondary | ICD-10-CM | POA: Insufficient documentation

## 2015-02-16 DIAGNOSIS — R0781 Pleurodynia: Secondary | ICD-10-CM | POA: Diagnosis not present

## 2015-02-16 MED ORDER — METOPROLOL TARTRATE 25 MG PO TABS: 25.0000 mg | ORAL_TABLET | Freq: Two times a day (BID) | ORAL | Status: DC

## 2015-02-16 NOTE — Progress Notes (Signed)
Pre visit review using our clinic review tool, if applicable. No additional management support is needed unless otherwise documented below in the visit note. 

## 2015-02-16 NOTE — Patient Instructions (Addendum)
Thank you for choosing [facility].  Summary/Instructions:  Your prescription(s) have been submitted to your pharmacy or been printed and provided for you. Please take as directed and contact our office if you believe you are having problem(s) with the medication(s) or have any questions.  Please stop by radiology on the basement level of the building for your x-rays. Your results will be released to [facility] (or called to you) after review, usually within 72 hours after test completion. If any treatments or changes are necessary, you will be notified at that same time.  If your symptoms worsen or fail to improve, please contact our office for further instruction, or in case of emergency go directly to the emergency room at the closest medical facility.   Rib Contusion A rib contusion (bruise) can occur by a blow to the chest or by a fall against a hard object. Usually these will be much better in a couple weeks. If X-rays were taken today and there are no broken bones (fractures), the diagnosis of bruising is made. However, broken ribs may not show up for several days, or may be discovered later on a routine X-ray when signs of healing show up. If this happens to you, it does not mean that something was missed on the X-ray, but simply that it did not show up on the first X-rays. Earlier diagnosis will not usually change the treatment. HOME CARE INSTRUCTIONS   Avoid strenuous activity. Be careful during activities and avoid bumping the injured ribs. Activities that pull on the injured ribs and cause pain should be avoided, if possible.  For the first day or two, an ice pack used every 20 minutes while awake may be helpful. Put ice in a plastic bag and put a towel between the bag and the skin.  Eat a normal, well-balanced diet. Drink plenty of fluids to avoid constipation.  Take deep breaths several times a day to keep lungs free of infection. Try to cough several times a day. Splint the  injured area with a pillow while coughing to ease pain. Coughing can help prevent pneumonia.  Wear a rib belt or binder only if told to do so by your caregiver. If you are wearing a rib belt or binder, you must do the breathing exercises as directed by your caregiver. If not used properly, rib belts or binders restrict breathing which can lead to pneumonia.  Only take over-the-counter or prescription medicines for pain, discomfort, or fever as directed by your caregiver. SEEK MEDICAL CARE IF:   You or your child has an oral temperature above 102 F (38.9 C).  Your baby is older than 3 months with a rectal temperature of 100.5 F (38.1 C) or higher for more than 1 day.  You develop a cough, with thick or bloody sputum. SEEK IMMEDIATE MEDICAL CARE IF:   You have difficulty breathing.  You feel sick to your stomach (nausea), have vomiting or belly (abdominal) pain.  You have worsening pain, not controlled with medications, or there is a change in the location of the pain.  You develop sweating or radiation of the pain into the arms, jaw or shoulders, or become light headed or faint.  You or your child has an oral temperature above 102 F (38.9 C), not controlled by medicine.  Your or your baby is older than 3 months with a rectal temperature of 102 F (38.9 C) or higher.  Your baby is 62 months old or younger with a rectal temperature  of 100.4 F (38 C) or higher. MAKE SURE YOU:   Understand these instructions.  Will watch your condition.  Will get help right away if you are not doing well or get worse. Document Released: 08/02/2001 Document Revised: 03/04/2013 Document Reviewed: 06/25/2008 [facility] Patient Information 2015 [place], [place]. This information is not intended to replace advice given to you by your health care provider. Make sure you discuss any questions you have with your health care provider.

## 2015-02-16 NOTE — Assessment & Plan Note (Signed)
Previously diagnosed with hypertension. Blood pressure is elevated today at 164/100. Start metoprolol. Follow-up in one month or sooner to determine effectiveness.
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
## 2015-02-16 NOTE — Assessment & Plan Note (Signed)
Stable and currently being managed by Dr. Hardin Negus of pain management. Continue previously prescribed Robaxin and morphine at their current dosages.
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
## 2015-02-17 ENCOUNTER — Telehealth: Payer: Self-pay | Admitting: Family

## 2015-02-17 NOTE — Telephone Encounter (Signed)
Pt aware of results 

## 2015-02-17 NOTE — Telephone Encounter (Signed)
Please inform the patient that his x-ray results for his ribs showed no fractures.

## 2015-03-02 DIAGNOSIS — G894 Chronic pain syndrome: Secondary | ICD-10-CM | POA: Diagnosis not present

## 2015-03-02 DIAGNOSIS — Z79891 Long term (current) use of opiate analgesic: Secondary | ICD-10-CM | POA: Diagnosis not present

## 2015-03-02 DIAGNOSIS — M461 Sacroiliitis, not elsewhere classified: Secondary | ICD-10-CM | POA: Diagnosis not present

## 2015-03-02 DIAGNOSIS — M961 Postlaminectomy syndrome, not elsewhere classified: Secondary | ICD-10-CM | POA: Diagnosis not present

## 2015-03-19 ENCOUNTER — Ambulatory Visit (INDEPENDENT_AMBULATORY_CARE_PROVIDER_SITE_OTHER): Payer: Medicare Other | Admitting: Family

## 2015-03-19 ENCOUNTER — Encounter: Payer: Self-pay | Admitting: Family

## 2015-03-19 VITALS — BP 132/88 | HR 76 | Temp 99.2°F | Resp 18 | Ht 71.0 in | Wt 184.8 lb

## 2015-03-19 DIAGNOSIS — F5221 Male erectile disorder: Secondary | ICD-10-CM

## 2015-03-19 DIAGNOSIS — N529 Male erectile dysfunction, unspecified: Secondary | ICD-10-CM | POA: Insufficient documentation

## 2015-03-19 DIAGNOSIS — I10 Essential (primary) hypertension: Secondary | ICD-10-CM

## 2015-03-19 DIAGNOSIS — R4586 Emotional lability: Secondary | ICD-10-CM | POA: Insufficient documentation

## 2015-03-19 DIAGNOSIS — F39 Unspecified mood [affective] disorder: Secondary | ICD-10-CM

## 2015-03-19 MED ORDER — SILDENAFIL CITRATE 50 MG PO TABS: 50.0000 mg | ORAL_TABLET | Freq: Every day | ORAL | Status: DC | PRN

## 2015-03-19 MED ORDER — AMLODIPINE BESYLATE 5 MG PO TABS: 5.0000 mg | ORAL_TABLET | Freq: Every day | ORAL | Status: DC

## 2015-03-19 NOTE — Patient Instructions (Addendum)
Thank you for choosing [facility].  Summary/Instructions:  Your prescription(s) have been submitted to your pharmacy or been printed and provided for you. Please take as directed and contact our office if you believe you are having problem(s) with the medication(s) or have any questions.  If your symptoms worsen or fail to improve, please contact our office for further instruction, or in case of emergency go directly to the emergency room at the closest medical facility.    Amlodipine tablets What is this medicine? AMLODIPINE (am LOE di peen) is a calcium-channel blocker. It affects the amount of calcium found in your heart and muscle cells. This relaxes your blood vessels, which can reduce the amount of work the heart has to do. This medicine is used to lower high blood pressure. It is also used to prevent chest pain. This medicine may be used for other purposes; ask your health care provider or pharmacist if you have questions. COMMON BRAND NAME(S): Norvasc What should I tell my health care provider before I take this medicine? They need to know if you have any of these conditions: -heart problems like heart failure or aortic stenosis -liver disease -an unusual or allergic reaction to amlodipine, other medicines, foods, dyes, or preservatives -pregnant or trying to get pregnant -breast-feeding How should I use this medicine? Take this medicine by mouth with a glass of water. Follow the directions on the prescription label. Take your medicine at regular intervals. Do not take more medicine than directed. Talk to your pediatrician regarding the use of this medicine in children. Special care may be needed. This medicine has been used in children as young as 6. Persons over 22 years old may have a stronger reaction to this medicine and need smaller doses. Overdosage: If you think you have taken too much of this medicine contact a poison control center or emergency room at once. NOTE:  This medicine is only for you. Do not share this medicine with others. What if I miss a dose? If you miss a dose, take it as soon as you can. If it is almost time for your next dose, take only that dose. Do not take double or extra doses. What may interact with this medicine? -herbal or dietary supplements -local or general anesthetics -medicines for high blood pressure -medicines for prostate problems -rifampin This list may not describe all possible interactions. Give your health care provider a list of all the medicines, herbs, non-prescription drugs, or dietary supplements you use. Also tell them if you smoke, drink alcohol, or use illegal drugs. Some items may interact with your medicine. What should I watch for while using this medicine? Visit your doctor or health care professional for regular check ups. Check your blood pressure and pulse rate regularly. Ask your health care professional what your blood pressure and pulse rate should be, and when you should contact him or her. This medicine may make you feel confused, dizzy or lightheaded. Do not drive, use machinery, or do anything that needs mental alertness until you know how this medicine affects you. To reduce the risk of dizzy or fainting spells, do not sit or stand up quickly, especially if you are an older patient. Avoid alcoholic drinks; they can make you more dizzy. Do not suddenly stop taking amlodipine. Ask your doctor or health care professional how you can gradually reduce the dose. What side effects may I notice from receiving this medicine? Side effects that you should report to your doctor or health care  professional as soon as possible: -allergic reactions like skin rash, itching or hives, swelling of the face, lips, or tongue -breathing problems -changes in vision or hearing -chest pain -fast, irregular heartbeat -swelling of legs or ankles Side effects that usually do not require medical attention (report to your  doctor or health care professional if they continue or are bothersome): -dry mouth -facial flushing -nausea, vomiting -stomach gas, pain -tired, weak -trouble sleeping This list may not describe all possible side effects. Call your doctor for medical advice about side effects. You may report side effects to FDA at 1-800-FDA-1088. Where should I keep my medicine? Keep out of the reach of children. Store at room temperature between 59 and 86 degrees F (15 and 30 degrees C). Protect from light. Keep container tightly closed. Throw away any unused medicine after the expiration date. NOTE: This sheet is a summary. It may not cover all possible information. If you have questions about this medicine, talk to your doctor, pharmacist, or health care provider.  2015, Elsevier/Gold Standard. (2012-10-05 11:40:58)   Sildenafil tablets (Viagra) What is this medicine? SILDENAFIL (sil DEN a fil) is used to treat erection problems in men. This medicine may be used for other purposes; ask your health care provider or pharmacist if you have questions. COMMON BRAND NAME(S): Viagra What should I tell my health care provider before I take this medicine? They need to know if you have any of these conditions: -bleeding disorders -eye or vision problems, including a rare inherited eye disease called retinitis pigmentosa -anatomical deformation of the penis, Peyronie's disease, or history of priapism (painful and prolonged erection) -heart disease, angina, a history of heart attack, irregular heart beats, or other heart problems -high or low blood pressure -history of blood diseases, like sickle cell anemia or leukemia -history of stomach bleeding -kidney disease -liver disease -stroke -an unusual or allergic reaction to sildenafil, other medicines, foods, dyes, or preservatives -pregnant or trying to get pregnant -breast-feeding How should I use this medicine? Take this medicine by mouth with a glass of  water. Follow the directions on the prescription label. The dose is usually taken 1 hour before sexual activity. You should not take the dose more than once per day. Do not take your medicine more often than directed. Talk to your pediatrician regarding the use of this medicine in children. This medicine is not used in children for this condition. Overdosage: If you think you have taken too much of this medicine contact a poison control center or emergency room at once. NOTE: This medicine is only for you. Do not share this medicine with others. What if I miss a dose? This does not apply. Do not take double or extra doses. What may interact with this medicine? Do not take this medicine with any of the following medications: -cisapride -methscopolamine nitrate -nitrates like amyl nitrite, isosorbide dinitrate, isosorbide mononitrate, nitroglycerin -nitroprusside -other medicines for erectile dysfunction like avanafil, tadalafil, vardenafil -other sildenafil products (Revatio) This medicine may also interact with the following medications: -certain drugs for high blood pressure -certain drugs for the treatment of HIV infection or AIDS -certain drugs used for fungal or yeast infections, like fluconazole, itraconazole, ketoconazole, and voriconazole -cimetidine -erythromycin -rifampin This list may not describe all possible interactions. Give your health care provider a list of all the medicines, herbs, non-prescription drugs, or dietary supplements you use. Also tell them if you smoke, drink alcohol, or use illegal drugs. Some items may interact with your medicine. What should  I watch for while using this medicine? If you notice any changes in your vision while taking this drug, call your doctor or health care professional as soon as possible. Stop using this medicine and call your health care provider right away if you have a loss of sight in one or both eyes. Contact your doctor or health care  professional right away if you have an erection that lasts longer than 4 hours or if it becomes painful. This may be a sign of a serious problem and must be treated right away to prevent permanent damage. If you experience symptoms of nausea, dizziness, chest pain or arm pain upon initiation of sexual activity after taking this medicine, you should refrain from further activity and call your doctor or health care professional as soon as possible. Do not drink alcohol to excess (examples, 5 glasses of wine or 5 shots of whiskey) when taking this medicine. When taken in excess, alcohol can increase your chances of getting a headache or getting dizzy, increasing your heart rate or lowering your blood pressure. Using this medicine does not protect you or your partner against HIV infection (the virus that causes AIDS) or other sexually transmitted diseases. What side effects may I notice from receiving this medicine? Side effects that you should report to your doctor or health care professional as soon as possible: -allergic reactions like skin rash, itching or hives, swelling of the face, lips, or tongue -breathing problems -changes in hearing -changes in vision -chest pain -fast, irregular heartbeat -prolonged or painful erection -seizures Side effects that usually do not require medical attention (report to your doctor or health care professional if they continue or are bothersome): -back pain -dizziness -flushing -headache -indigestion -muscle aches -nausea -stuffy or runny nose This list may not describe all possible side effects. Call your doctor for medical advice about side effects. You may report side effects to FDA at 1-800-FDA-1088. Where should I keep my medicine? Keep out of reach of children. Store at room temperature between 15 and 30 degrees C (59 and 86 degrees F). Throw away any unused medicine after the expiration date. NOTE: This sheet is a summary. It may not cover all  possible information. If you have questions about this medicine, talk to your doctor, pharmacist, or health care provider.  2015, Elsevier/Gold Standard. (2012-11-07 12:43:54)

## 2015-03-19 NOTE — Progress Notes (Signed)
Pre visit review using our clinic review tool, if applicable. No additional management support is needed unless otherwise documented below in the visit note. 

## 2015-03-19 NOTE — Assessment & Plan Note (Signed)
Recently expressed inability to obtain an erection. He would like to try Viagra. Discussed risks and benefits and possible causes of erectile dysfunction. Start Viagra. Obtain lab work with physical.

## 2015-03-19 NOTE — Assessment & Plan Note (Signed)
Family has noticed no changes in patient's behavior. Patient indicates that he has not had any mood changes and it is improved when he is not around his family. Continue to monitor at this time consider adding medication if symptoms worsen or fail to improve.

## 2015-03-19 NOTE — Progress Notes (Signed)
   Subjective:    Patient ID: Jeremiah Anderson, male    DOB: 01/17/1966, 49 y.o.   MRN: [id number]  Chief Complaint  Patient presents with  . Follow-up    would like to try a new BP medicine, the metoprolol was giving him "weird" side effects, rib pain is better    HPI:  Willmer Anderson is a 49 y.o. male with a PMH of hypertension who presents today for a follow up office visit.   1) Hypertension - Previously started on metoprolol. Patient has self-discontinued this medication because he indicates that it makes him feel weird. He has taken his blood pressure at home which reveal average blood pressures in the 130s/80-90s.   BP Readings from Last 3 Encounters:  03/19/15 132/88  02/16/15 164/100    2) Anger - This is a new problem. His mother tells him that he experiences the associated symptom of anger. He describes mood changes that are labile. Indicates that his mother takes celexa and that has helped her with her mood.   3) Erectile dysfunction - Indicates that he has recently experienced the inability to obtain an erection. This is a new problem that started about 2 weeks ago when he was with his girlfriend. He is interested in potential medication.    Allergies  Allergen Reactions  . Fentanyl     Hallucinations   . Lisinopril Swelling  . Lyrica [Pregabalin]     hallucinations  . Sulfa Antibiotics Swelling    Current Outpatient Prescriptions on File Prior to Visit  Medication Sig Dispense Refill  . butalbital-acetaminophen-caffeine (FIORICET, ESGIC) 50-325-40 MG per tablet Take by mouth 2 (two) times daily as needed for headache.    . methocarbamol (ROBAXIN) 750 MG tablet Take 750 mg by mouth 4 (four) times daily.    Marland Kitchen morphine (MSIR) 30 MG tablet Take 30 mg by mouth every 4 (four) hours as needed for severe pain.     No current facility-administered medications on file prior to visit.   Past Medical History  Diagnosis Date  . Arthritis   . Hypertension   . Migraines     . GERD (gastroesophageal reflux disease)   . Hyperlipidemia      Review of Systems  Eyes:       Negative for changes in vision.  Respiratory: Negative for chest tightness and shortness of breath.   Cardiovascular: Negative for chest pain, palpitations and leg swelling.  Neurological: Negative for headaches.      Objective:    BP 132/88 mmHg  Pulse 76  Temp(Src) 99.2 F (37.3 C) (Oral)  Resp 18  Ht 5\' 11"  (1.803 m)  Wt 184 lb 12.8 oz (83.825 kg)  BMI 25.79 kg/m2  SpO2 96% Nursing note and vital signs reviewed.  Physical Exam  Constitutional: He is oriented to person, place, and time. He appears well-developed and well-nourished. No distress.  Cardiovascular: Normal rate, regular rhythm, normal heart sounds and intact distal pulses.   Pulmonary/Chest: Effort normal and breath sounds normal.  Neurological: He is alert and oriented to person, place, and time.  Skin: Skin is warm and dry.  Psychiatric: Judgment and thought content normal. His mood appears anxious. He is hyperactive.       Assessment & Plan:

## 2015-03-19 NOTE — Assessment & Plan Note (Addendum)
Blood pressure is below goal of 140/90 and patient is not currently taking any medication as he self-discontinued his metoprolol secondary to "weird" feeling. Discontinue metoprolol. Start amlodipine. Follow up in one month.

## 2015-03-26 DIAGNOSIS — M961 Postlaminectomy syndrome, not elsewhere classified: Secondary | ICD-10-CM | POA: Diagnosis not present

## 2015-03-26 DIAGNOSIS — M461 Sacroiliitis, not elsewhere classified: Secondary | ICD-10-CM | POA: Diagnosis not present

## 2015-03-26 DIAGNOSIS — Z79891 Long term (current) use of opiate analgesic: Secondary | ICD-10-CM | POA: Diagnosis not present

## 2015-03-26 DIAGNOSIS — G894 Chronic pain syndrome: Secondary | ICD-10-CM | POA: Diagnosis not present

## 2015-03-27 ENCOUNTER — Telehealth: Payer: Self-pay | Admitting: Family

## 2015-03-27 MED ORDER — BUTALBITAL-APAP-CAFFEINE 50-325-40 MG PO TABS: 1.0000 | ORAL_TABLET | Freq: Two times a day (BID) | ORAL | Status: DC | PRN

## 2015-03-27 NOTE — Telephone Encounter (Signed)
Patient requesting a prescription for butalbital-acetaminophen-caffeine (FIORICET, ESGIC) 50-325-40 MG per tablet [[id number]] . He will be going out of town tomorrow and was hoping to get the prescription before then

## 2015-03-27 NOTE — Telephone Encounter (Signed)
Prescription printed and signed

## 2015-03-29 ENCOUNTER — Other Ambulatory Visit: Payer: Self-pay | Admitting: Family

## 2015-04-15 ENCOUNTER — Telehealth: Payer: Self-pay | Admitting: Family

## 2015-04-15 MED ORDER — AMLODIPINE BESYLATE 5 MG PO TABS: 5.0000 mg | ORAL_TABLET | Freq: Every day | ORAL | Status: DC

## 2015-04-15 NOTE — Telephone Encounter (Signed)
Rx filled. LVM making pt aware.

## 2015-04-15 NOTE — Telephone Encounter (Signed)
Pt called in and needs refill on his amLODipine (NORVASC) 5 MG tablet [[id number]]   [facility] on [address]

## 2015-04-21 ENCOUNTER — Ambulatory Visit (INDEPENDENT_AMBULATORY_CARE_PROVIDER_SITE_OTHER): Payer: Medicare Other | Admitting: Family

## 2015-04-21 ENCOUNTER — Encounter: Payer: Self-pay | Admitting: Family

## 2015-04-21 VITALS — BP 138/86 | HR 78 | Temp 98.2°F | Resp 18 | Ht 71.0 in | Wt 189.4 lb

## 2015-04-21 DIAGNOSIS — I10 Essential (primary) hypertension: Secondary | ICD-10-CM

## 2015-04-21 DIAGNOSIS — G43909 Migraine, unspecified, not intractable, without status migrainosus: Secondary | ICD-10-CM | POA: Insufficient documentation

## 2015-04-21 DIAGNOSIS — F4323 Adjustment disorder with mixed anxiety and depressed mood: Secondary | ICD-10-CM | POA: Diagnosis not present

## 2015-04-21 DIAGNOSIS — G43809 Other migraine, not intractable, without status migrainosus: Secondary | ICD-10-CM | POA: Diagnosis not present

## 2015-04-21 DIAGNOSIS — Z79891 Long term (current) use of opiate analgesic: Secondary | ICD-10-CM | POA: Diagnosis not present

## 2015-04-21 DIAGNOSIS — G894 Chronic pain syndrome: Secondary | ICD-10-CM | POA: Diagnosis not present

## 2015-04-21 DIAGNOSIS — M961 Postlaminectomy syndrome, not elsewhere classified: Secondary | ICD-10-CM | POA: Diagnosis not present

## 2015-04-21 MED ORDER — BUTALBITAL-APAP-CAFFEINE 50-325-40 MG PO TABS: 1.0000 | ORAL_TABLET | Freq: Two times a day (BID) | ORAL | Status: DC | PRN

## 2015-04-21 NOTE — Patient Instructions (Addendum)
Thank you for choosing [facility].  Summary/Instructions:  Continue to take you your blood pressure at home.  Continue to take your medication as prescribed.   If your symptoms worsen or fail to improve, please contact our office for further instruction, or in case of emergency go directly to the emergency room at the closest medical facility.

## 2015-04-21 NOTE — Assessment & Plan Note (Signed)
Stable with current regimen of Fioricet as needed for extreme headaches. Continue current dosage of Fioricet and refill medication. Follow up if no longer working or progressive worsening.

## 2015-04-21 NOTE — Assessment & Plan Note (Signed)
Blood pressure average remains below goal of 140/90. Continue current dosage of amlodipine. Continue to monitor blood pressure at home. Follow up in 3 months.

## 2015-04-21 NOTE — Progress Notes (Signed)
Pre visit review using our clinic review tool, if applicable. No additional management support is needed unless otherwise documented below in the visit note. 

## 2015-04-21 NOTE — Progress Notes (Signed)
   Subjective:    Patient ID: Jeremiah Anderson, male    DOB: [birth year], 49 y.o.   MRN: [id number]  Chief Complaint  Patient presents with  . Follow-up    Says his BP was running good and the last week it went up, had a stressful week    HPI:  Jeremiah Anderson is a 49 y.o. male with a PMH of essential hypertension who presents today for an office follow-up.  1.) Hypertension: Previously changed from metoprolol to amlodipine secondary to side effects with metoprolol. Continues to take the amlodipine as prescribed and denies adverse side effects. Indicates that his BP has been doing good and the last week increased secondary to a stressful week.   BP Readings from Last 3 Encounters:  04/21/15 138/86  03/19/15 132/88  02/16/15 164/100    2.) Migraines - Stable and maintained on Fioricet. Indicates he takes the medication as prescribed and denies adverse side effects. States the medication helps to control his worst headaches. Requests refill of medication.    Allergies  Allergen Reactions  . Fentanyl     Hallucinations   . Lisinopril Swelling  . Lyrica [Pregabalin]     hallucinations  . Sulfa Antibiotics Swelling    Current Outpatient Prescriptions on File Prior to Visit  Medication Sig Dispense Refill  . amLODipine (NORVASC) 5 MG tablet Take 1 tablet (5 mg total) by mouth daily. 30 tablet 2  . methocarbamol (ROBAXIN) 750 MG tablet Take 750 mg by mouth 4 (four) times daily.    Marland Kitchen morphine (MSIR) 30 MG tablet Take 30 mg by mouth every 4 (four) hours as needed for severe pain.     No current facility-administered medications on file prior to visit.    Review of Systems  Respiratory: Negative for chest tightness and shortness of breath.   Cardiovascular: Negative for chest pain, palpitations and leg swelling.  Neurological: Negative for headaches.      Objective:    BP 138/86 mmHg  Pulse 78  Temp(Src) 98.2 F (36.8 C) (Oral)  Resp 18  Ht 5\' 11"  (1.803 m)  Wt 189 lb 6.4 oz  (85.911 kg)  BMI 26.43 kg/m2  SpO2 98% Nursing note and vital signs reviewed.  Physical Exam  Constitutional: He is oriented to person, place, and time. He appears well-developed and well-nourished. No distress.  Cardiovascular: Normal rate, regular rhythm, normal heart sounds and intact distal pulses.   Pulmonary/Chest: Effort normal and breath sounds normal.  Neurological: He is alert and oriented to person, place, and time.  Skin: Skin is warm and dry.  Psychiatric: He has a normal mood and affect. His behavior is normal. Judgment and thought content normal.       Assessment & Plan:   Problem List Items Addressed This Visit      Cardiovascular and Mediastinum   Essential hypertension - Primary    Blood pressure average remains below goal of 140/90. Continue current dosage of amlodipine. Continue to monitor blood pressure at home. Follow up in 3 months.      Migraines    Stable with current regimen of Fioricet as needed for extreme headaches. Continue current dosage of Fioricet and refill medication. Follow up if no longer working or progressive worsening.       Relevant Medications   butalbital-acetaminophen-caffeine (FIORICET, ESGIC) 50-325-40 MG per tablet

## 2015-05-07 ENCOUNTER — Ambulatory Visit (INDEPENDENT_AMBULATORY_CARE_PROVIDER_SITE_OTHER): Payer: Medicare Other | Admitting: Internal Medicine

## 2015-05-07 ENCOUNTER — Encounter: Payer: Self-pay | Admitting: Internal Medicine

## 2015-05-07 VITALS — BP 134/84 | HR 96 | Temp 98.7°F | Ht 70.0 in | Wt 180.0 lb

## 2015-05-07 DIAGNOSIS — R21 Rash and other nonspecific skin eruption: Secondary | ICD-10-CM

## 2015-05-07 DIAGNOSIS — I10 Essential (primary) hypertension: Secondary | ICD-10-CM | POA: Diagnosis not present

## 2015-05-07 DIAGNOSIS — E785 Hyperlipidemia, unspecified: Secondary | ICD-10-CM | POA: Diagnosis not present

## 2015-05-07 DIAGNOSIS — N320 Bladder-neck obstruction: Secondary | ICD-10-CM

## 2015-05-07 MED ORDER — DOXYCYCLINE HYCLATE 100 MG PO TABS: 100.0000 mg | ORAL_TABLET | Freq: Two times a day (BID) | ORAL | Status: DC

## 2015-05-07 NOTE — Assessment & Plan Note (Signed)
stable overall by history and exam, recent data reviewed with pt, and pt to continue medical treatment as before,  to f/u any worsening symptoms or concerns BP Readings from Last 3 Encounters:  05/07/15 134/84  04/21/15 138/86  03/19/15 132/88

## 2015-05-07 NOTE — Progress Notes (Signed)
Subjective:    Patient ID: Jeremiah Anderson, male    DOB: [birth year], 49 y.o.   MRN: [id number]  HPI  Here with 1 wk onset fever, myalgias, general weakness and rash nonpruritic to torso as well as extremities, Pt denies chest pain, increased sob or doe, wheezing, orthopnea, PND, increased LE swelling, palpitations, dizziness or syncope.  Pt denies new neurological symptoms such as new headache, or facial or extremity weakness or numbness   Pt denies polydipsia, polyuria,    2 wks ago with tick bite to right neck, no local rash at the time  Also asks for labs to f/u lipids, trying to follow lower chol diet Past Medical History  Diagnosis Date  . Arthritis   . Hypertension   . Migraines   . GERD (gastroesophageal reflux disease)   . Hyperlipidemia    Past Surgical History  Procedure Laterality Date  . Spine surgery    . Appendectomy    . Spinal fusion    . Back surgery      8 surgeries total - fused from L1-S1  . 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   . Knee surgery      Multiple bilateral scopes    reports that he has been smoking Cigarettes.  He has never used smokeless tobacco. He reports that he drinks alcohol. He reports that he does not use illicit drugs. family history includes Arthritis in his mother; Hyperlipidemia in his mother; Hypertension in his father. Allergies  Allergen Reactions  . Fentanyl     Hallucinations   . Lisinopril Swelling  . Lyrica [Pregabalin]     hallucinations  . Sulfa Antibiotics Swelling   Current Outpatient Prescriptions on File Prior to Visit  Medication Sig Dispense Refill  . amLODipine (NORVASC) 5 MG tablet Take 1 tablet (5 mg total) by mouth daily. 30 tablet 2  . butalbital-acetaminophen-caffeine (FIORICET, ESGIC) 50-325-40 MG per tablet Take 1 tablet by mouth 2 (two) times daily as needed for headache. 14 tablet 0  . methocarbamol (ROBAXIN) 750 MG tablet Take 750 mg by mouth 4 (four) times daily.    Marland Kitchen  morphine (MSIR) 30 MG tablet Take 30 mg by mouth every 4 (four) hours as needed for severe pain.     No current facility-administered medications on file prior to visit.    Review of Systems  Constitutional: Negative for unusual diaphoresis or night sweats HENT: Negative for ringing in ear or discharge Eyes: Negative for double vision or worsening visual disturbance.  Respiratory: Negative for choking and stridor.   Gastrointestinal: Negative for vomiting or other signifcant bowel change Genitourinary: Negative for hematuria or change in urine volume.  Musculoskeletal: Negative for other MSK pain or swelling Skin: Negative for color change and worsening wound.  Neurological: Negative for tremors and numbness other than noted  Psychiatric/Behavioral: Negative for decreased concentration or agitation other than above       Objective:   Physical Exam BP 134/84 mmHg  Pulse 96  Temp(Src) 98.7 F (37.1 C) (Oral)  Ht 5\' 10"  (1.778 m)  Wt 180 lb (81.647 kg)  BMI 25.83 kg/m2  SpO2 96% VS noted, mild ill Constitutional: Pt appears in no significant distress HENT: Head: NCAT.  Right Ear: External ear normal.  Left Ear: External ear normal.  Eyes: . Pupils are equal, round, and reactive to light. Conjunctivae and EOM are normal Neck: Normal range of motion. Neck supple.  Cardiovascular: Normal rate and  regular rhythm.   Pulmonary/Chest: Effort normal and breath sounds without rales or wheezing.  Neurological: Pt is alert. Not confused , motor grossly intact Skin: Skin is warm., no LE edema, diffuse TNTC erthem 5 mm or less lesions nonitchy nonpainful nonvesicular or pustular Psychiatric: Pt behavior is normal. No agitation.      Assessment & Plan:

## 2015-05-07 NOTE — Assessment & Plan Note (Signed)
?   RMSF or similar, for serology, also doxy course asd,  to f/u any worsening symptoms or concerns

## 2015-05-07 NOTE — Patient Instructions (Signed)
Please take all new medication as prescribed - the antibiotic  Please continue all other medications as before, and refills have been done if requested.  Please have the pharmacy call with any other refills you may need.  Please continue your efforts at being more active, low cholesterol diet, and weight control.  Please keep your appointments with your specialists as you may have planned  Please go to the LAB in the Basement (turn left off the elevator) for the tests to be done today  You will be contacted by phone if any changes need to be made immediately.  Otherwise, you will receive a letter about your results with an explanation, but please check with MyChart first.

## 2015-05-07 NOTE — Assessment & Plan Note (Signed)
stable overall by history and exam, recent data reviewed with pt, and pt to continue medical treatment as before,  to f/u any worsening symptoms or concerns No results found for: LDLCALC For f/u labs 

## 2015-05-07 NOTE — Progress Notes (Signed)
Pre visit review using our clinic review tool, if applicable. No additional management support is needed unless otherwise documented below in the visit note. 

## 2015-05-08 ENCOUNTER — Other Ambulatory Visit (INDEPENDENT_AMBULATORY_CARE_PROVIDER_SITE_OTHER): Payer: Medicare Other

## 2015-05-08 ENCOUNTER — Encounter: Payer: Self-pay | Admitting: Internal Medicine

## 2015-05-08 ENCOUNTER — Telehealth: Payer: Self-pay | Admitting: Family

## 2015-05-08 DIAGNOSIS — I10 Essential (primary) hypertension: Secondary | ICD-10-CM | POA: Diagnosis not present

## 2015-05-08 DIAGNOSIS — R21 Rash and other nonspecific skin eruption: Secondary | ICD-10-CM

## 2015-05-08 LAB — URINALYSIS, ROUTINE W REFLEX MICROSCOPIC
Bilirubin Urine: NEGATIVE
Hgb urine dipstick: NEGATIVE
Leukocytes, UA: NEGATIVE
NITRITE: NEGATIVE
PH: 6 (ref 5.0–8.0)
Specific Gravity, Urine: 1.025 (ref 1.000–1.030)
UROBILINOGEN UA: 0.2 (ref 0.0–1.0)
Urine Glucose: NEGATIVE

## 2015-05-08 LAB — HEPATIC FUNCTION PANEL
ALT: 43 U/L (ref 0–53)
AST: 32 U/L (ref 0–37)
Albumin: 4 g/dL (ref 3.5–5.2)
Alkaline Phosphatase: 80 U/L (ref 39–117)
BILIRUBIN DIRECT: 0 mg/dL (ref 0.0–0.3)
BILIRUBIN TOTAL: 0.4 mg/dL (ref 0.2–1.2)
Total Protein: 6.5 g/dL (ref 6.0–8.3)

## 2015-05-08 LAB — LIPID PANEL
Cholesterol: 180 mg/dL (ref 0–200)
HDL: 32.8 mg/dL — ABNORMAL LOW (ref >39.00)
LDL Cholesterol: 112 mg/dL — ABNORMAL HIGH (ref 0–99)
NonHDL: 147.2
TRIGLYCERIDES: 178 mg/dL — AB (ref 0.0–149.0)
Total CHOL/HDL Ratio: 5
VLDL: 35.6 mg/dL (ref 0.0–40.0)

## 2015-05-08 LAB — CBC WITH DIFFERENTIAL/PLATELET
BASOS ABS: 0 10*3/uL (ref 0.0–0.1)
Basophils Relative: 0.2 % (ref 0.0–3.0)
EOS ABS: 0 10*3/uL (ref 0.0–0.7)
Eosinophils Relative: 0.2 % (ref 0.0–5.0)
HCT: 35.6 % — ABNORMAL LOW (ref 39.0–52.0)
Hemoglobin: 12.1 g/dL — ABNORMAL LOW (ref 13.0–17.0)
LYMPHS ABS: 1.8 10*3/uL (ref 0.7–4.0)
Lymphocytes Relative: 29.7 % (ref 12.0–46.0)
MCHC: 34.2 g/dL (ref 30.0–36.0)
MCV: 84.8 fl (ref 78.0–100.0)
MONO ABS: 0.7 10*3/uL (ref 0.1–1.0)
Monocytes Relative: 10.8 % (ref 3.0–12.0)
NEUTROS ABS: 3.6 10*3/uL (ref 1.4–7.7)
Neutrophils Relative %: 59.1 % (ref 43.0–77.0)
PLATELETS: 202 10*3/uL (ref 150.0–400.0)
RBC: 4.19 Mil/uL — AB (ref 4.22–5.81)
RDW: 12.9 % (ref 11.5–15.5)
WBC: 6.1 10*3/uL (ref 4.0–10.5)

## 2015-05-08 LAB — BASIC METABOLIC PANEL
BUN: 9 mg/dL (ref 6–23)
CALCIUM: 8.9 mg/dL (ref 8.4–10.5)
CO2: 29 mEq/L (ref 19–32)
Chloride: 95 mEq/L — ABNORMAL LOW (ref 96–112)
Creatinine, Ser: 0.86 mg/dL (ref 0.40–1.50)
GFR: 100.45 mL/min (ref >60.00)
Glucose, Bld: 105 mg/dL — ABNORMAL HIGH (ref 70–99)
Potassium: 3.9 mEq/L (ref 3.5–5.1)
SODIUM: 132 meq/L — AB (ref 135–145)

## 2015-05-08 LAB — TSH: TSH: 0.81 u[IU]/mL (ref 0.35–4.50)

## 2015-05-08 NOTE — Telephone Encounter (Signed)
psa will be paid using the code I used, this has never been a problem before.  OK to keep the PSA  Sorry this type of controlled substance would not be approp for this illness, we would normally recommend tylenol ro advil prn

## 2015-05-08 NOTE — Telephone Encounter (Signed)
Patient was seen yesterday by Dr. Jenny Reichmann and he has [facility] Spotted Fever and he's coming in for labs today and he would like you to take the order for PSA off the list. Medicare will not pay for it. Also, he has a pretty bad headache from his illness and he was hoping you can prescribe him butalbital-acetaminophen-caffeine (FIORICET, ESGIC) 50-325-40 MG per tablet [[id number]] DISCONTINUED for him. He was hoping to pick up the prescription this afternoon when he comes in for labs.

## 2015-05-08 NOTE — Telephone Encounter (Signed)
Pt advised via personal VM 

## 2015-05-11 LAB — ROCKY MTN SPOTTED FVR ABS PNL(IGG+IGM)
RMSF IgG: 0.2 IV
RMSF IgM: 0.48 IV

## 2015-05-12 ENCOUNTER — Telehealth: Payer: Self-pay | Admitting: Family

## 2015-05-12 MED ORDER — BUTALBITAL-APAP-CAFFEINE 50-325-40 MG PO TABS: 1.0000 | ORAL_TABLET | Freq: Two times a day (BID) | ORAL | Status: DC | PRN

## 2015-05-12 NOTE — Telephone Encounter (Signed)
Pt came in today thinking his appt was this afternoon but it is tomorrow afternoon.  He is wondering if he can get a refill on Firocet.  Pharmacy is [facility] [address].

## 2015-05-12 NOTE — Telephone Encounter (Signed)
Medication sent to pharmacy per patient request.  ?

## 2015-05-13 ENCOUNTER — Ambulatory Visit (INDEPENDENT_AMBULATORY_CARE_PROVIDER_SITE_OTHER): Payer: Medicare Other | Admitting: Family

## 2015-05-13 ENCOUNTER — Encounter: Payer: Self-pay | Admitting: Family

## 2015-05-13 VITALS — BP 160/110 | HR 89 | Temp 98.6°F | Resp 18 | Ht 70.0 in | Wt 179.1 lb

## 2015-05-13 DIAGNOSIS — R21 Rash and other nonspecific skin eruption: Secondary | ICD-10-CM | POA: Diagnosis not present

## 2015-05-13 MED ORDER — DOXYCYCLINE HYCLATE 100 MG PO TABS: 100.0000 mg | ORAL_TABLET | Freq: Two times a day (BID) | ORAL | Status: DC

## 2015-05-13 NOTE — Progress Notes (Signed)
   Subjective:    Patient ID: Jeremiah Anderson, male    DOB: [birth year], 49 y.o.   MRN: [id number]  Chief Complaint  Patient presents with  . Generalized Body Aches    headache, stomach pain and body aches, did find a tick on him and thinks it might have bit him, has been throwing up    HPI:  Jeremiah Anderson is a 49 y.o. male with a PMH of migraines, hyperlipidemia, who presents today for a follow up appointment.  Recently seen in the office for myalgias, generalized weakness and non-puritic rash which was thought to possible be related to [facility] Spotted Fever. Lab work was negative for RMSF and he was treated with doxycycline. Notes that the rash has improved since starting the medication, however he continues to experience headaches, stomach pain and body aches. He has 3 days remaining of the previously prescribed doxycycline.   Allergies  Allergen Reactions  . Fentanyl     Hallucinations   . Lisinopril Swelling  . Lyrica [Pregabalin]     hallucinations  . Sulfa Antibiotics Swelling    Current Outpatient Prescriptions on File Prior to Visit  Medication Sig Dispense Refill  . amLODipine (NORVASC) 5 MG tablet Take 1 tablet (5 mg total) by mouth daily. 30 tablet 2  . butalbital-acetaminophen-caffeine (FIORICET, ESGIC) 50-325-40 MG per tablet Take 1 tablet by mouth 2 (two) times daily as needed for headache. 14 tablet 0  . methocarbamol (ROBAXIN) 750 MG tablet Take 750 mg by mouth 4 (four) times daily.    Marland Kitchen morphine (MSIR) 30 MG tablet Take 30 mg by mouth every 4 (four) hours as needed for severe pain.     No current facility-administered medications on file prior to visit.    Review of Systems  Constitutional: Negative for fever and fatigue.  Gastrointestinal: Positive for nausea and vomiting.  Skin: Positive for rash.  Neurological: Positive for headaches.      Objective:    BP 160/110 mmHg  Pulse 89  Temp(Src) 98.6 F (37 C) (Oral)  Resp 18  Ht 5\' 10"  (1.778 m)   Wt 179 lb 1.9 oz (81.248 kg)  BMI 25.70 kg/m2  SpO2 93% Nursing note and vital signs reviewed.  Physical Exam  Constitutional: He is oriented to person, place, and time. He appears well-developed and well-nourished. No distress.  Cardiovascular: Normal rate, regular rhythm, normal heart sounds and intact distal pulses.   Pulmonary/Chest: Effort normal and breath sounds normal.  Neurological: He is alert and oriented to person, place, and time.  Skin: Skin is warm and dry.  Sporadic papules with erythemateous base in various areas including shoulders and abdomen.  Psychiatric: He has a normal mood and affect. His behavior is normal. Judgment and thought content normal.       Assessment & Plan:   Problem List Items Addressed This Visit      Musculoskeletal and Integument   Rash - Primary    [place] spotted fever blood work was negative. Unlikely Lyme disease. Question origin of rash as it is responsive to doxycycline. Continues to improve slowly. Continue doxycycline until completed. Cannot rule out nausea and abdominal symptoms related to possibly doxycycline. Follow-up if symptoms worsen or fail to improve.

## 2015-05-13 NOTE — Patient Instructions (Signed)
Thank you for choosing [facility].  Summary/Instructions:  Your prescription(s) have been submitted to your pharmacy or been printed and provided for you. Please take as directed and contact our office if you believe you are having problem(s) with the medication(s) or have any questions.  If your symptoms worsen or fail to improve, please contact our office for further instruction, or in case of emergency go directly to the emergency room at the closest medical facility.     Please continue the doxycycline to completion.   Follow up if symptoms worsen or fail to improve.   [place] Spotted Fever [place] Spotted Fever (RMSF) is the oldest known tick-borne disease of people in the [place]. This disease was named because it was first described among people in the [facility] area who had an illness characterized by a rash with red-purple-black spots. This disease is caused by a rickettsia (Rickettsia rickettsii), a bacteria carried by the tick. The [facility] wood tick and the American dog tick acquire and transmit the RMSF bacteria (pictures NOT actual size). When a larval, nymphal, or adult tick feeds on an infected rodent or larger animal, the tick can become infected. Infected adult ticks then feed on people who may then get RMSF. The tick transmits the disease to humans during a prolonged period of feeding that lasts many hours, days, or even a couple weeks. The bite is painless and frequently goes unnoticed. An infected male tick may also pass the rickettsial bacteria to her eggs that then may mature to be infected adult ticks. The rickettsia that causes RMSF can also get into a person's body through damaged skin. A tick bite is not necessary. People can get RMSF if they crush a tick and get its blood or body fluids on their skin through a small cut or sore.  DIAGNOSIS Diagnosis is made by laboratory tests.  TREATMENT Treatment is with antibiotics  (medications that kill rickettsia and other bacteria). Immediate treatment usually prevents death. GEOGRAPHIC RANGE This disease was reported only in the [facility] until 1931. RMSF has more recently been described among individuals in all states except [place], [place], and [place]. The highest reported incidences of RMSF now occur among residents of [place], [place], [place], and the [place]. TIME OF YEAR  Most cases are diagnosed during late spring and summer when ticks are most active. However, especially in the warmer [place] states, a few cases occur during the winter. SYMPTOMS   Symptoms of RMSF begin from 2 to 14 days after a tick bite. The most common early symptoms are fever, muscle aches, and headache followed by nausea (feeling sick to your stomach) or vomiting.  The RMSF rash is typically delayed until 3 or more days after symptom onset, and eventually develops in 9 of 10 infected patients by the fifth day of illness. If the disease is not treated it can cause death. If you get a fever, headache, muscle aches, rash, nausea, or vomiting within 2 weeks of a possible tick bite or exposure, you should see your caregiver immediately. PREVENTION Ticks prefer to hide in shady, moist ground litter. They can often be found above the ground clinging to tall grass, brush, shrubs and low tree branches. They also inhabit lawns and gardens, especially at the edges of woodlands and around old stone walls. Within the areas where ticks generally live, no naturally vegetated area can be considered completely free of infected ticks. The best precaution against RMSF is to avoid contact with soil,  leaf litter, and vegetation as much as possible in tick-infested areas. For those who enjoy gardening or walking in their yards, clear brush and mow tall grass around houses and at the edges of gardens. This may help reduce the tick population in the immediate area. Applications of chemical insecticides by a  licensed professional in the spring (late May) and fall (September) will also control ticks, especially in heavily infested areas. Treatment will never get rid of all the ticks. Getting rid of small animal populations that host ticks will also decrease the tick population. When working in the garden, [facility] [employer], or handling soil and vegetation, wear light-colored protective clothing and gloves. Spot-check often to prevent ticks from reaching the skin. Ticks cannot jump or fly. They will not drop from an above-ground perch onto a passing animal. Once a tick gains access to human skin it climbs upward until it reaches a more protected area. For example, the back of the knee, groin, navel, armpit, ears, or nape of the neck. It then begins the slow process of embedding itself in the skin. Campers, hikers, field workers, and others who spend time in wooded, brushy, or tall grassy areas can avoid exposure to ticks by using the following precautions:  Wear light-colored clothing with a tight weave to spot ticks more easily and prevent contact with the skin.  Wear long pants tucked into socks, long-sleeved shirts tucked into pants and enclosed shoes or boots along with insect repellent.  Spray clothes with insect repellent containing either DEET or Permethrin. Only DEET can be used on exposed skin. Follow the manufacturer's directions carefully.  Wear a hat and keep long hair pulled back.  Stay on cleared, well-worn trails whenever possible.  Spot-check yourself and others often for the presence of ticks on clothes. If you find one, there are likely to be others. Check thoroughly.  Remove clothes after leaving tick-infested areas. If possible, wash them to eliminate any unseen ticks. Check yourself, your children and any pets from head to toe for the presence of ticks.  Shower and shampoo. You can greatly reduce your chances of contracting RMSF if you remove attached ticks as soon as possible. Regular  checks of the body, including all body sites covered by hair (head, armpits, genitals), allow removal of the tick before rickettsial transmission. To remove an attached tick, use a forceps or tweezers to detach the intact tick without leaving mouth parts in the skin. The tick bite wound should be cleansed after tick removal. Remember the most common symptoms of RMSF are fever, muscle aches, headache, and nausea or vomiting with a later onset of rash. If you get these symptoms after a tick bite and while living in an area where RMSF is found, RMSF should be suspected. If the disease is not treated, it can cause death. See your caregiver immediately if you get these symptoms. Do this even if not aware of a tick bite. Document Released: 02/19/2001 Document Revised: 03/24/2014 Document Reviewed: 10/12/2009 [facility] Patient Information 2015 [place], [place]. This information is not intended to replace advice given to you by your health care provider. Make sure you discuss any questions you have with your health care provider.  Lyme Disease You may have been bitten by a tick and are to watch for the development of Lyme Disease. Lyme Disease is an infection that is caused by a bacteria The bacteria causing this disease is named Borreilia burgdorferi. If a tick is infected with this bacteria and then bites you,  then Lyme Disease may occur. These ticks are carried by deer and rodents such as rabbits and mice and infest grassy as well as forested areas. Fortunately most tick bites do not cause Lyme Disease.  Lyme Disease is easier to prevent than to treat. First, covering your legs with clothing when walking in areas where ticks are possibly abundant will prevent their attachment because ticks tend to stay within inches of the ground. Second, using insecticides containing DEET can be applied on skin or clothing. Last, because it takes about 12 to 24 hours for the tick to transmit the disease after attachment to the  human host, you should inspect your body for ticks twice a day when you are in areas where Lyme Disease is common. You must look thoroughly when searching for ticks. The Ixodes tick that carries Lyme Disease is very small. It is around the size of a sesame seed (picture of tick is not actual size). Removal is best done by grasping the tick by the head and pulling it out. Do not to squeeze the body of the tick. This could inject the infecting bacteria into the bite site. Wash the area of the bite with an antiseptic solution after removal.  Lyme Disease is a disease that may affect many body systems. Because of the small size of the biting tick, most people do not notice being bitten. The first sign of an infection is usually a round red rash that extends out from the center of the tick bite. The center of the lesion may be blood colored (hemorrhagic) or have tiny blisters (vesicular). Most lesions have bright red outer borders and partial central clearing. This rash may extend out many inches in diameter, and multiple lesions may be present. Other symptoms such as fatigue, headaches, chills and fever, general achiness and swelling of lymph glands may also occur. If this first stage of the disease is left untreated, these symptoms may gradually resolve by themselves, or progressive symptoms may occur because of spread of infection to other areas of the body.  Follow up with your caregiver to have testing and treatment if you have a tick bite and you develop any of the above complaints. Your caregiver may recommend preventative (prophylactic) medications which kill bacteria (antibiotics). Once a diagnosis of Lyme Disease is made, antibiotic treatment is highly likely to cure the disease. Effective treatment of late stage Lyme Disease may require longer courses of antibiotic therapy.  MAKE SURE YOU:   Understand these instructions.  Will watch your condition.  Will get help right away if you are not doing well  or get worse. Document Released: 02/13/2001 Document Revised: 01/30/2012 Document Reviewed: 04/17/2009 [facility] Patient Information 2015 [place], [place]. This information is not intended to replace advice given to you by your health care provider. Make sure you discuss any questions you have with your health care provider.

## 2015-05-13 NOTE — Progress Notes (Signed)
Pre visit review using our clinic review tool, if applicable. No additional management support is needed unless otherwise documented below in the visit note. 

## 2015-05-13 NOTE — Assessment & Plan Note (Signed)
[facility] spotted fever blood work was negative. Unlikely Lyme disease. Question origin of rash as it is responsive to doxycycline. Continues to improve slowly. Continue doxycycline until completed. Cannot rule out nausea and abdominal symptoms related to possibly doxycycline. Follow-up if symptoms worsen or fail to improve.

## 2015-05-15 DIAGNOSIS — Z79891 Long term (current) use of opiate analgesic: Secondary | ICD-10-CM | POA: Diagnosis not present

## 2015-05-15 DIAGNOSIS — M961 Postlaminectomy syndrome, not elsewhere classified: Secondary | ICD-10-CM | POA: Diagnosis not present

## 2015-05-15 DIAGNOSIS — G894 Chronic pain syndrome: Secondary | ICD-10-CM | POA: Diagnosis not present

## 2015-05-15 DIAGNOSIS — F4323 Adjustment disorder with mixed anxiety and depressed mood: Secondary | ICD-10-CM | POA: Diagnosis not present

## 2015-05-25 ENCOUNTER — Other Ambulatory Visit: Payer: Self-pay | Admitting: Family

## 2015-05-26 ENCOUNTER — Other Ambulatory Visit: Payer: Self-pay | Admitting: Family

## 2015-05-27 ENCOUNTER — Ambulatory Visit (INDEPENDENT_AMBULATORY_CARE_PROVIDER_SITE_OTHER): Payer: Medicare Other | Admitting: Family

## 2015-05-27 ENCOUNTER — Encounter: Payer: Self-pay | Admitting: Family

## 2015-05-27 ENCOUNTER — Encounter (INDEPENDENT_AMBULATORY_CARE_PROVIDER_SITE_OTHER): Payer: Self-pay

## 2015-05-27 VITALS — BP 152/108 | HR 69 | Temp 98.5°F | Resp 18 | Ht 70.0 in | Wt 184.0 lb

## 2015-05-27 DIAGNOSIS — R21 Rash and other nonspecific skin eruption: Secondary | ICD-10-CM | POA: Diagnosis not present

## 2015-05-27 DIAGNOSIS — R112 Nausea with vomiting, unspecified: Secondary | ICD-10-CM | POA: Diagnosis not present

## 2015-05-27 DIAGNOSIS — I10 Essential (primary) hypertension: Secondary | ICD-10-CM

## 2015-05-27 MED ORDER — ONDANSETRON HCL 4 MG PO TABS: 4.0000 mg | ORAL_TABLET | Freq: Three times a day (TID) | ORAL | Status: DC | PRN

## 2015-05-27 MED ORDER — AMLODIPINE BESYLATE 10 MG PO TABS: 5.0000 mg | ORAL_TABLET | Freq: Every day | ORAL | Status: DC

## 2015-05-27 MED ORDER — OMEPRAZOLE 20 MG PO CPDR: 20.0000 mg | DELAYED_RELEASE_CAPSULE | Freq: Every day | ORAL | Status: DC

## 2015-05-27 NOTE — Assessment & Plan Note (Signed)
Blood pressure remains elevated above goal 140/90. Cannot rule out blood pressure as cause of his current headaches. Increase amlodipine to 10 mg daily. Continue to monitor blood pressure at home. Follow up 1 month.

## 2015-05-27 NOTE — Progress Notes (Signed)
Pre visit review using our clinic review tool, if applicable. No additional management support is needed unless otherwise documented below in the visit note. 

## 2015-05-27 NOTE — Assessment & Plan Note (Addendum)
Symptoms of nausea, vomiting, and burning consistent with potential GERD, however cannot rule out underlying ulcer. Start omeprazole. If symptoms worsen or fail to improve with omeprazole, obtain blood work for H. pylori and possible referral to gastroenterology. Start Zofran as needed for nausea.

## 2015-05-27 NOTE — Patient Instructions (Addendum)
Please increase your amlodipine to 10 mg daily.  Please start taking the omeprazole daily.   Please start the Zofran as needed for nausea.   Please try Activia as needed for a probiotic.   Follow up if symptoms worsen or fail to improve.

## 2015-05-27 NOTE — Assessment & Plan Note (Signed)
Recently completed a round of doxycycline and rash is slowly fading with improvement since previous visit. Continue to monitor at this time.

## 2015-05-27 NOTE — Progress Notes (Signed)
Subjective:    Patient ID: Jeremiah Anderson, male    DOB: 01-Nov-1966, 49 y.o.   MRN: [id number]  Chief Complaint  Patient presents with  . Follow-up    would like to know if he can go up on the quanity of fioricet and also would like to see if he can get zofran, still having issues with stomach, vomitting, nausea, burning sensation all day long, also has had a headache every day that doesn't feel like a normal headache    HPI:  Jeremiah Anderson is a 49 y.o. male with a PMH of hypertension, migraines, low back pain, and hyperlipidemia who presents today for an acute office visit.   1.) Rash - Previously seen in the office for a rash that was tested negative for [facility] Spotted fever. Continues to experience several spots that are resolving slowly located on his trunk and left upper arm. Has completed treatment regimen of doxycycline. Also notes that he continues to experience stomach issues, vomiting, nausea and a burning sensation. Timing of the symptoms is all day.   2.) Headache - Associated symptom of headaches have been going on for a couple of weeks and described as dull aches. Denies pounding, pulsating, or sensitivity to light or sound. Has tried to treat it with the Fioricet, which does take off the edge. Does have nausea.  3.) Blood pressure - Currently maintained on amlodipine. Reports increased in headaches and elevated blood pressure. Takes his medication as prescribed and denies adverse side effects of the medication.   BP Readings from Last 3 Encounters:  05/27/15 152/108  05/13/15 160/110  05/07/15 134/84     Allergies  Allergen Reactions  . Fentanyl     Hallucinations   . Lisinopril Swelling  . Lyrica [Pregabalin]     hallucinations  . Sulfa Antibiotics Swelling    Current Outpatient Prescriptions on File Prior to Visit  Medication Sig Dispense Refill  . butalbital-acetaminophen-caffeine (FIORICET, ESGIC) 50-325-40 MG per tablet TAKE ONE TABLET BY MOUTH  TWICE DAILY AS NEEDED FOR HEADACHE 14 tablet 0  . methocarbamol (ROBAXIN) 750 MG tablet Take 750 mg by mouth 4 (four) times daily.    Marland Kitchen morphine (MSIR) 30 MG tablet Take 30 mg by mouth every 4 (four) hours as needed for severe pain.     No current facility-administered medications on file prior to visit.    Review of Systems  Constitutional: Negative for fever and chills.  Respiratory: Negative for chest tightness and shortness of breath.   Gastrointestinal: Positive for nausea, vomiting and abdominal pain (burning).  Skin: Positive for rash.  Neurological: Positive for headaches.  Psychiatric/Behavioral: The patient is nervous/anxious.       Objective:    BP 152/108 mmHg  Pulse 69  Temp(Src) 98.5 F (36.9 C) (Oral)  Resp 18  Ht 5\' 10"  (1.778 m)  Wt 184 lb (83.462 kg)  BMI 26.40 kg/m2  SpO2 99% Nursing note and vital signs reviewed.  Physical Exam  Constitutional: He is oriented to person, place, and time. He appears well-developed and well-nourished. No distress.  HENT:  Right Ear: Hearing, tympanic membrane, external ear and ear canal normal.  Left Ear: Hearing, tympanic membrane, external ear and ear canal normal.  Nose: Nose normal.  Mouth/Throat: Uvula is midline, oropharynx is clear and moist and mucous membranes are normal.  Cardiovascular: Normal rate, regular rhythm, normal heart sounds and intact distal pulses.   Pulmonary/Chest: Effort normal and breath sounds normal.  Neurological: He is alert and  oriented to person, place, and time. He has normal reflexes. No cranial nerve deficit. He exhibits normal muscle tone. Coordination normal.  Skin: Skin is warm and dry.  Previous papular rash is healing and dissipating.   Psychiatric: He has a normal mood and affect. His behavior is normal. Judgment and thought content normal.       Assessment & Plan:   Problem List Items Addressed This Visit      Cardiovascular and Mediastinum   Essential hypertension - Primary     Blood pressure remains elevated above goal 140/90. Cannot rule out blood pressure as cause of his current headaches. Increase amlodipine to 10 mg daily. Continue to monitor blood pressure at home. Follow up 1 month.      Relevant Medications   amLODipine (NORVASC) 10 MG tablet     Digestive   Nausea & vomiting    Symptoms of nausea, vomiting, and burning consistent with potential GERD, however cannot rule out underlying ulcer. Start omeprazole. If symptoms worsen or fail to improve with omeprazole, obtain blood work for H. pylori and possible referral to gastroenterology. Start Zofran as needed for nausea.      Relevant Medications   omeprazole (PRILOSEC) 20 MG capsule   ondansetron (ZOFRAN) 4 MG tablet     Musculoskeletal and Integument   Rash    Recently completed a round of doxycycline and rash is slowly fading with improvement since previous visit. Continue to monitor at this time.

## 2015-05-28 ENCOUNTER — Telehealth: Payer: Self-pay | Admitting: Family

## 2015-05-28 ENCOUNTER — Encounter: Payer: Self-pay | Admitting: Nurse Practitioner

## 2015-05-28 DIAGNOSIS — R112 Nausea with vomiting, unspecified: Secondary | ICD-10-CM

## 2015-05-28 NOTE — Telephone Encounter (Signed)
Patient has been throwing up a lot and he will be running out of the butalbital-acetaminophen-caffeine (FIORICET, ESGIC) 50-325-40 MG per tablet [[id number]. Maybe change dosage. He will be needing a new prescription. Pharmacy is [facility] on [address] Can you please call patient to discuss this. [phone]

## 2015-05-29 MED ORDER — BUTALBITAL-APAP-CAFFEINE 50-325-40 MG PO TABS: ORAL_TABLET | ORAL | Status: DC

## 2015-05-29 NOTE — Telephone Encounter (Signed)
Please inform patient that I will refill his fioricet and I have also placed a referral to GI for the stomach issues.

## 2015-05-29 NOTE — Telephone Encounter (Signed)
LVM letting pt know.  

## 2015-06-08 DIAGNOSIS — G894 Chronic pain syndrome: Secondary | ICD-10-CM | POA: Diagnosis not present

## 2015-06-08 DIAGNOSIS — M961 Postlaminectomy syndrome, not elsewhere classified: Secondary | ICD-10-CM | POA: Diagnosis not present

## 2015-06-08 DIAGNOSIS — Z79891 Long term (current) use of opiate analgesic: Secondary | ICD-10-CM | POA: Diagnosis not present

## 2015-06-08 DIAGNOSIS — F4323 Adjustment disorder with mixed anxiety and depressed mood: Secondary | ICD-10-CM | POA: Diagnosis not present

## 2015-06-09 ENCOUNTER — Observation Stay (HOSPITAL_COMMUNITY)
Admission: EM | Admit: 2015-06-09 | Discharge: 2015-06-12 | Disposition: A | Discharge status: Home / Self Care | Payer: Medicare Other | Attending: Internal Medicine | Admitting: Internal Medicine

## 2015-06-09 ENCOUNTER — Encounter (HOSPITAL_COMMUNITY): Payer: Self-pay | Admitting: Neurology

## 2015-06-09 ENCOUNTER — Emergency Department (HOSPITAL_COMMUNITY): Payer: Medicare Other

## 2015-06-09 DIAGNOSIS — G8929 Other chronic pain: Secondary | ICD-10-CM | POA: Diagnosis present

## 2015-06-09 DIAGNOSIS — M549 Dorsalgia, unspecified: Secondary | ICD-10-CM | POA: Diagnosis not present

## 2015-06-09 DIAGNOSIS — R1110 Vomiting, unspecified: Secondary | ICD-10-CM

## 2015-06-09 DIAGNOSIS — N281 Cyst of kidney, acquired: Secondary | ICD-10-CM | POA: Diagnosis not present

## 2015-06-09 DIAGNOSIS — K219 Gastro-esophageal reflux disease without esophagitis: Secondary | ICD-10-CM | POA: Insufficient documentation

## 2015-06-09 DIAGNOSIS — E785 Hyperlipidemia, unspecified: Secondary | ICD-10-CM | POA: Insufficient documentation

## 2015-06-09 DIAGNOSIS — K209 Esophagitis, unspecified: Secondary | ICD-10-CM | POA: Insufficient documentation

## 2015-06-09 DIAGNOSIS — Z96612 Presence of left artificial shoulder joint: Secondary | ICD-10-CM | POA: Diagnosis not present

## 2015-06-09 DIAGNOSIS — I10 Essential (primary) hypertension: Secondary | ICD-10-CM | POA: Diagnosis not present

## 2015-06-09 DIAGNOSIS — F17210 Nicotine dependence, cigarettes, uncomplicated: Secondary | ICD-10-CM | POA: Diagnosis not present

## 2015-06-09 DIAGNOSIS — K2950 Unspecified chronic gastritis without bleeding: Secondary | ICD-10-CM | POA: Diagnosis not present

## 2015-06-09 DIAGNOSIS — R112 Nausea with vomiting, unspecified: Secondary | ICD-10-CM | POA: Diagnosis present

## 2015-06-09 DIAGNOSIS — K2960 Other gastritis without bleeding: Secondary | ICD-10-CM

## 2015-06-09 DIAGNOSIS — K269 Duodenal ulcer, unspecified as acute or chronic, without hemorrhage or perforation: Secondary | ICD-10-CM

## 2015-06-09 DIAGNOSIS — T50905A Adverse effect of unspecified drugs, medicaments and biological substances, initial encounter: Secondary | ICD-10-CM | POA: Diagnosis present

## 2015-06-09 DIAGNOSIS — N3289 Other specified disorders of bladder: Secondary | ICD-10-CM | POA: Diagnosis not present

## 2015-06-09 DIAGNOSIS — K2981 Duodenitis with bleeding: Secondary | ICD-10-CM

## 2015-06-09 LAB — CBC
HEMATOCRIT: 41.3 % (ref 39.0–52.0)
HEMOGLOBIN: 14.7 g/dL (ref 13.0–17.0)
MCH: 29.5 pg (ref 26.0–34.0)
MCHC: 35.6 g/dL (ref 30.0–36.0)
MCV: 82.8 fL (ref 78.0–100.0)
PLATELETS: 309 10*3/uL (ref 150–400)
RBC: 4.99 MIL/uL (ref 4.22–5.81)
RDW: 12.6 % (ref 11.5–15.5)
WBC: 11.3 10*3/uL — ABNORMAL HIGH (ref 4.0–10.5)

## 2015-06-09 LAB — URINALYSIS, ROUTINE W REFLEX MICROSCOPIC
GLUCOSE, UA: NEGATIVE mg/dL
Hgb urine dipstick: NEGATIVE
Ketones, ur: >80 mg/dL — AB
LEUKOCYTES UA: NEGATIVE
Nitrite: NEGATIVE
Protein, ur: NEGATIVE mg/dL
Specific Gravity, Urine: 1.044 — ABNORMAL HIGH (ref 1.005–1.030)
Urobilinogen, UA: 0.2 mg/dL (ref 0.0–1.0)
pH: 6 (ref 5.0–8.0)

## 2015-06-09 LAB — COMPREHENSIVE METABOLIC PANEL
ALT: 20 U/L (ref 17–63)
ANION GAP: 13 (ref 5–15)
AST: 29 U/L (ref 15–41)
Albumin: 4.5 g/dL (ref 3.5–5.0)
Alkaline Phosphatase: 69 U/L (ref 38–126)
BUN: 10 mg/dL (ref 6–20)
CO2: 23 mmol/L (ref 22–32)
Calcium: 9.6 mg/dL (ref 8.9–10.3)
Chloride: 97 mmol/L — ABNORMAL LOW (ref 101–111)
Creatinine, Ser: 0.89 mg/dL (ref 0.61–1.24)
GFR calc Af Amer: >60 mL/min (ref >60)
GLUCOSE: 125 mg/dL — AB (ref 65–99)
Potassium: 3.6 mmol/L (ref 3.5–5.1)
SODIUM: 133 mmol/L — AB (ref 135–145)
TOTAL PROTEIN: 6.9 g/dL (ref 6.5–8.1)
Total Bilirubin: 1.1 mg/dL (ref 0.3–1.2)

## 2015-06-09 LAB — LIPASE, BLOOD: Lipase: 25 U/L (ref 22–51)

## 2015-06-09 MED ORDER — ONDANSETRON HCL 4 MG/2ML IJ SOLN
4.0000 mg | Freq: Once | INTRAMUSCULAR | Status: AC
  Administered 2015-06-09: 4 mg via INTRAVENOUS
  Filled 2015-06-09: qty 2

## 2015-06-09 MED ORDER — SODIUM CHLORIDE 0.9 % IV BOLUS (SEPSIS)
1000.0000 mL | Freq: Once | INTRAVENOUS | Status: AC
  Administered 2015-06-09: 1000 mL via INTRAVENOUS

## 2015-06-09 MED ORDER — IOHEXOL 300 MG/ML  SOLN
25.0000 mL | Freq: Once | INTRAMUSCULAR | Status: AC | PRN
  Administered 2015-06-09: 25 mL via ORAL

## 2015-06-09 MED ORDER — IOHEXOL 300 MG/ML  SOLN
100.0000 mL | Freq: Once | INTRAMUSCULAR | Status: AC | PRN
  Administered 2015-06-09: 100 mL via INTRAVENOUS

## 2015-06-09 MED ORDER — MORPHINE SULFATE 4 MG/ML IJ SOLN
4.0000 mg | Freq: Once | INTRAMUSCULAR | Status: AC
  Administered 2015-06-09: 4 mg via INTRAVENOUS
  Filled 2015-06-09: qty 1

## 2015-06-09 MED ORDER — MORPHINE SULFATE 4 MG/ML IJ SOLN
6.0000 mg | Freq: Once | INTRAMUSCULAR | Status: AC
  Administered 2015-06-09: 6 mg via INTRAVENOUS
  Filled 2015-06-09: qty 2

## 2015-06-09 MED ORDER — LORAZEPAM 2 MG/ML IJ SOLN
0.5000 mg | Freq: Once | INTRAMUSCULAR | Status: AC
  Administered 2015-06-09: 0.5 mg via INTRAVENOUS
  Filled 2015-06-09: qty 1

## 2015-06-09 NOTE — ED Notes (Signed)
Pt. Actively vomiting. Nurse and dr. Ree Kida.

## 2015-06-09 NOTE — ED Provider Notes (Signed)
CSN: [id number]     Arrival date & time 06/09/15  1501 History   First MD Initiated Contact with Patient 06/09/15 1744     Chief Complaint  Patient presents with  . Emesis  . Nausea   (Consider location/radiation/quality/duration/timing/severity/associated sxs/prior Treatment) HPI  Pt is a 49 yo male with hx of multiple orthopedic surgeries and abdominal surgeries presenting for increasing abdominal pain over the last several weeks.  Reports was diagnosed with RMSF 2-3 weeks ago and recently finished abx tx.  Since beginning abx has continued to have abdominal pain and cramping.  Pain radiating to his back and alleviated by laying on stomach.  Over the past three days has had increasing abdominal pain and nausea with no alleviation by zofran.  Unable to take home medications at this point.  Aggravated by palpation and movement.  Rated as 10/10 pain.   Past Medical History  Diagnosis Date  . Arthritis   . Hypertension   . Migraines   . GERD (gastroesophageal reflux disease)   . Hyperlipidemia    Past Surgical History  Procedure Laterality Date  . Spine surgery    . Appendectomy    . Spinal fusion    . Back surgery      8 surgeries total - fused from L1-S1  . 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   . Knee surgery      Multiple bilateral scopes   Family History  Problem Relation Age of Onset  . Arthritis Mother   . Hyperlipidemia Mother   . Hypertension Father    History  Substance Use Topics  . Smoking status: Current Some Day Smoker    Types: Cigarettes  . Smokeless tobacco: Never Used  . Alcohol Use: 0.0 oz/week    0 Standard drinks or equivalent per week     Comment: rarely    Review of Systems  Constitutional: Negative for fever and chills.  HENT: Negative for congestion and sore throat.   Eyes: Negative for pain.  Respiratory: Negative for cough and shortness of breath.   Cardiovascular: Negative for chest pain and palpitations.   Gastrointestinal: Positive for nausea, vomiting and abdominal pain. Negative for diarrhea.  Endocrine: Negative.   Genitourinary: Negative for flank pain.  Musculoskeletal: Negative for back pain and neck pain.  Skin: Negative for rash.  Allergic/Immunologic: Negative.   Neurological: Negative for dizziness, syncope and light-headedness.  Psychiatric/Behavioral: Negative for confusion.    Allergies  Fentanyl; Lisinopril; Lyrica; and Sulfa antibiotics  Home Medications   Prior to Admission medications   Medication Sig Start Date End Date Taking? Authorizing Provider  amLODipine (NORVASC) 10 MG tablet Take 0.5 tablets (5 mg total) by mouth daily. 05/27/15  Yes Golden Circle, FNP  butalbital-acetaminophen-caffeine (FIORICET, ESGIC) [phone] MG per tablet TAKE ONE TABLET BY MOUTH TWICE DAILY AS NEEDED FOR HEADACHE 05/29/15  Yes Golden Circle, FNP  methocarbamol (ROBAXIN) 750 MG tablet Take 750 mg by mouth 4 (four) times daily.   Yes Historical Provider, MD  morphine (MSIR) 30 MG tablet Take 30 mg by mouth every 4 (four) hours as needed for severe pain.   Yes Historical Provider, MD  omeprazole (PRILOSEC) 20 MG capsule Take 1 capsule (20 mg total) by mouth daily. 05/27/15  Yes Golden Circle, FNP  ondansetron (ZOFRAN) 4 MG tablet Take 1 tablet (4 mg total) by mouth every 8 (eight) hours as needed for nausea or vomiting. 05/27/15  Yes Belenda Cruise  D Calone, FNP   BP 149/89 mmHg  Pulse 101  Temp(Src) 98.4 F (36.9 C) (Oral)  Resp 18  SpO2 100% Physical Exam  Constitutional: He is oriented to person, place, and time. He appears well-developed and well-nourished. He appears distressed.  Pt belching and vomiting on exam.   HENT:  Head: Normocephalic and atraumatic.  Eyes: Conjunctivae and EOM are normal. Pupils are equal, round, and reactive to light.  Neck: Normal range of motion. Neck supple.  Cardiovascular: Normal rate, regular rhythm, normal heart sounds and intact distal pulses.    Pulmonary/Chest: Effort normal and breath sounds normal. No respiratory distress.  Abdominal: Soft. Bowel sounds are normal. There is tenderness in the right upper quadrant and epigastric area. There is positive Murphy's sign. There is no rigidity, no rebound, no guarding, no CVA tenderness and no tenderness at McBurney's point.  Musculoskeletal: Normal range of motion.  Neurological: He is alert and oriented to person, place, and time. He has normal reflexes. No cranial nerve deficit.  Skin: Skin is warm and dry.   ED Course  Procedures (including critical care time) Labs Review Labs Reviewed  COMPREHENSIVE METABOLIC PANEL - Abnormal; Notable for the following:    Sodium 133 (*)    Chloride 97 (*)    Glucose, Bld 125 (*)    All other components within normal limits  CBC - Abnormal; Notable for the following:    WBC 11.3 (*)    All other components within normal limits  LIPASE, BLOOD  URINALYSIS, ROUTINE W REFLEX MICROSCOPIC (NOT AT [facility])   Imaging Review No results found.   EKG Interpretation None      MDM   Final diagnoses:  None    On evaluation pt HDS in moderate distress.  Zofran and pain medication initially alleviated some pain but pt continued vomiting after 2 doses of pain medication and zofran.  Mild leukocytosis and hx of multiple abdominal surgeries concerning for possible partial obstruction.  CT performed showing no acute abnormalities.  No elevated Cr or BUN but large ketones in the urine and likely dehydrated.  Given fluids in the ED.  Lipase and LFTs wnl.  Attempted PO challenge in ED and failed.  Consulted Hospitalist for admission for observation, IV fluids, and pain management.    If performed, labs, EKGs, and imaging were reviewed/interpreted by myself and my attending and incorporated into medical decision making.  Discussed pertinent finding with patient or caregiver prior to admission with no further questions.  Pt care supervised by my attending Dr.  Janelle Floor, MD PGY-2  Emergency Medicine     Geronimo Boot, MD 06/10/15 9758  Varney Biles, MD 06/11/15 8325

## 2015-06-09 NOTE — ED Notes (Signed)
Patient transported to CT 

## 2015-06-09 NOTE — ED Notes (Addendum)
Pt. Unable to void at the moment. Will try again after pt. Returns from [place].

## 2015-06-09 NOTE — ED Notes (Addendum)
Pt reports has been taking doxycycline for [place] spotted fever but finished 2 weeks ago, 2 nights ago vomiting and can't keep anything down. Has been taking phenergan and zofran but can't keep it down. Is also chronic pain but can;t keep the meds down.

## 2015-06-10 ENCOUNTER — Encounter (HOSPITAL_COMMUNITY)
Admission: EM | Disposition: A | Discharge status: Home / Self Care | Payer: Self-pay | Source: Home / Self Care | Attending: Emergency Medicine

## 2015-06-10 ENCOUNTER — Encounter (HOSPITAL_COMMUNITY): Payer: Self-pay | Admitting: Internal Medicine

## 2015-06-10 DIAGNOSIS — F17210 Nicotine dependence, cigarettes, uncomplicated: Secondary | ICD-10-CM | POA: Diagnosis not present

## 2015-06-10 DIAGNOSIS — K2981 Duodenitis with bleeding: Secondary | ICD-10-CM | POA: Diagnosis not present

## 2015-06-10 DIAGNOSIS — K219 Gastro-esophageal reflux disease without esophagitis: Secondary | ICD-10-CM | POA: Diagnosis not present

## 2015-06-10 DIAGNOSIS — G8929 Other chronic pain: Secondary | ICD-10-CM | POA: Diagnosis present

## 2015-06-10 DIAGNOSIS — K2950 Unspecified chronic gastritis without bleeding: Secondary | ICD-10-CM | POA: Diagnosis not present

## 2015-06-10 DIAGNOSIS — M549 Dorsalgia, unspecified: Secondary | ICD-10-CM

## 2015-06-10 DIAGNOSIS — I10 Essential (primary) hypertension: Secondary | ICD-10-CM

## 2015-06-10 DIAGNOSIS — R1110 Vomiting, unspecified: Secondary | ICD-10-CM | POA: Diagnosis not present

## 2015-06-10 DIAGNOSIS — K269 Duodenal ulcer, unspecified as acute or chronic, without hemorrhage or perforation: Secondary | ICD-10-CM

## 2015-06-10 DIAGNOSIS — K2970 Gastritis, unspecified, without bleeding: Secondary | ICD-10-CM | POA: Diagnosis not present

## 2015-06-10 DIAGNOSIS — Z96612 Presence of left artificial shoulder joint: Secondary | ICD-10-CM | POA: Diagnosis not present

## 2015-06-10 DIAGNOSIS — T50905A Adverse effect of unspecified drugs, medicaments and biological substances, initial encounter: Secondary | ICD-10-CM | POA: Diagnosis present

## 2015-06-10 DIAGNOSIS — K2960 Other gastritis without bleeding: Secondary | ICD-10-CM | POA: Diagnosis present

## 2015-06-10 DIAGNOSIS — K209 Esophagitis, unspecified: Secondary | ICD-10-CM | POA: Diagnosis not present

## 2015-06-10 DIAGNOSIS — R112 Nausea with vomiting, unspecified: Secondary | ICD-10-CM | POA: Diagnosis present

## 2015-06-10 DIAGNOSIS — E785 Hyperlipidemia, unspecified: Secondary | ICD-10-CM | POA: Diagnosis not present

## 2015-06-10 DIAGNOSIS — R1013 Epigastric pain: Secondary | ICD-10-CM | POA: Diagnosis not present

## 2015-06-10 HISTORY — PX: ESOPHAGOGASTRODUODENOSCOPY: SHX5428

## 2015-06-10 LAB — COMPREHENSIVE METABOLIC PANEL
ALT: 20 U/L (ref 17–63)
AST: 27 U/L (ref 15–41)
Albumin: 3.8 g/dL (ref 3.5–5.0)
Alkaline Phosphatase: 57 U/L (ref 38–126)
Anion gap: 11 (ref 5–15)
BUN: 8 mg/dL (ref 6–20)
CHLORIDE: 99 mmol/L — AB (ref 101–111)
CO2: 23 mmol/L (ref 22–32)
Calcium: 8.6 mg/dL — ABNORMAL LOW (ref 8.9–10.3)
Creatinine, Ser: 0.71 mg/dL (ref 0.61–1.24)
GFR calc Af Amer: >60 mL/min (ref >60)
Glucose, Bld: 112 mg/dL — ABNORMAL HIGH (ref 65–99)
POTASSIUM: 3.6 mmol/L (ref 3.5–5.1)
Sodium: 133 mmol/L — ABNORMAL LOW (ref 135–145)
Total Bilirubin: 1.2 mg/dL (ref 0.3–1.2)
Total Protein: 6 g/dL — ABNORMAL LOW (ref 6.5–8.1)

## 2015-06-10 LAB — CBC
HEMATOCRIT: 38.3 % — AB (ref 39.0–52.0)
HEMOGLOBIN: 13.5 g/dL (ref 13.0–17.0)
MCH: 29.5 pg (ref 26.0–34.0)
MCHC: 35.2 g/dL (ref 30.0–36.0)
MCV: 83.6 fL (ref 78.0–100.0)
PLATELETS: 250 10*3/uL (ref 150–400)
RBC: 4.58 MIL/uL (ref 4.22–5.81)
RDW: 12.7 % (ref 11.5–15.5)
WBC: 11.8 10*3/uL — ABNORMAL HIGH (ref 4.0–10.5)

## 2015-06-10 LAB — PROTIME-INR
INR: 1.06 (ref 0.00–1.49)
PROTHROMBIN TIME: 14 s (ref 11.6–15.2)

## 2015-06-10 SURGERY — EGD (ESOPHAGOGASTRODUODENOSCOPY)
Anesthesia: Moderate Sedation

## 2015-06-10 MED ORDER — SODIUM CHLORIDE 0.9 % IV SOLN: INTRAVENOUS | Status: DC

## 2015-06-10 MED ORDER — MORPHINE SULFATE 2 MG/ML IJ SOLN
2.0000 mg | Freq: Three times a day (TID) | INTRAMUSCULAR | Status: DC | PRN
  Administered 2015-06-10: 2 mg via INTRAVENOUS

## 2015-06-10 MED ORDER — MIDAZOLAM HCL 10 MG/2ML IJ SOLN
INTRAMUSCULAR | Status: DC | PRN
  Administered 2015-06-10: 1 mg via INTRAVENOUS
  Administered 2015-06-10 (×3): 2 mg via INTRAVENOUS

## 2015-06-10 MED ORDER — PANTOPRAZOLE SODIUM 40 MG IV SOLR
40.0000 mg | Freq: Two times a day (BID) | INTRAVENOUS | Status: DC
  Administered 2015-06-10 – 2015-06-11 (×4): 40 mg via INTRAVENOUS
  Filled 2015-06-10 (×3): qty 40

## 2015-06-10 MED ORDER — FENTANYL CITRATE (PF) 100 MCG/2ML IJ SOLN
INTRAMUSCULAR | Status: DC | PRN
  Administered 2015-06-10 (×4): 25 ug via INTRAVENOUS

## 2015-06-10 MED ORDER — FENTANYL CITRATE (PF) 100 MCG/2ML IJ SOLN
INTRAMUSCULAR | Status: AC
  Filled 2015-06-10: qty 4

## 2015-06-10 MED ORDER — ACETAMINOPHEN 325 MG PO TABS: 650.0000 mg | ORAL_TABLET | Freq: Four times a day (QID) | ORAL | Status: DC | PRN

## 2015-06-10 MED ORDER — ONDANSETRON HCL 4 MG/2ML IJ SOLN
4.0000 mg | Freq: Four times a day (QID) | INTRAMUSCULAR | Status: DC | PRN
  Administered 2015-06-10: 4 mg via INTRAVENOUS
  Filled 2015-06-10: qty 2

## 2015-06-10 MED ORDER — ONDANSETRON HCL 4 MG PO TABS
4.0000 mg | ORAL_TABLET | Freq: Four times a day (QID) | ORAL | Status: DC | PRN
Start: 2015-06-10 — End: 2015-06-10

## 2015-06-10 MED ORDER — MORPHINE SULFATE 2 MG/ML IJ SOLN: 2.0000 mg | Freq: Three times a day (TID) | INTRAMUSCULAR | Status: DC | PRN

## 2015-06-10 MED ORDER — ENSURE ENLIVE PO LIQD: 237.0000 mL | Freq: Two times a day (BID) | ORAL | Status: DC

## 2015-06-10 MED ORDER — DIPHENHYDRAMINE HCL 50 MG/ML IJ SOLN
INTRAMUSCULAR | Status: AC
  Filled 2015-06-10: qty 1

## 2015-06-10 MED ORDER — SODIUM CHLORIDE 0.9 % IV SOLN
INTRAVENOUS | Status: DC
  Administered 2015-06-10 – 2015-06-12 (×4): via INTRAVENOUS

## 2015-06-10 MED ORDER — ONDANSETRON HCL 4 MG PO TABS: 4.0000 mg | ORAL_TABLET | Freq: Four times a day (QID) | ORAL | Status: DC | PRN

## 2015-06-10 MED ORDER — ENOXAPARIN SODIUM 40 MG/0.4ML ~~LOC~~ SOLN: 40.0000 mg | SUBCUTANEOUS | Status: DC

## 2015-06-10 MED ORDER — AMLODIPINE BESYLATE 10 MG PO TABS
10.0000 mg | ORAL_TABLET | Freq: Every day | ORAL | Status: DC
  Administered 2015-06-10 – 2015-06-11 (×2): 10 mg via ORAL
  Filled 2015-06-10 (×2): qty 1

## 2015-06-10 MED ORDER — PANTOPRAZOLE SODIUM 40 MG IV SOLR: 40.0000 mg | Freq: Two times a day (BID) | INTRAVENOUS | Status: DC

## 2015-06-10 MED ORDER — ONDANSETRON HCL 4 MG PO TABS
4.0000 mg | ORAL_TABLET | ORAL | Status: DC | PRN
  Administered 2015-06-12 (×2): 4 mg via ORAL
  Filled 2015-06-10 (×2): qty 1

## 2015-06-10 MED ORDER — PANTOPRAZOLE SODIUM 40 MG IV SOLR
INTRAVENOUS | Status: AC
  Filled 2015-06-10: qty 40

## 2015-06-10 MED ORDER — MORPHINE SULFATE 15 MG PO TABS: 30.0000 mg | ORAL_TABLET | ORAL | Status: DC | PRN

## 2015-06-10 MED ORDER — ONDANSETRON HCL 4 MG/2ML IJ SOLN: 4.0000 mg | Freq: Four times a day (QID) | INTRAMUSCULAR | Status: DC | PRN

## 2015-06-10 MED ORDER — DIPHENHYDRAMINE HCL 50 MG/ML IJ SOLN
INTRAMUSCULAR | Status: DC | PRN
  Administered 2015-06-10: 25 mg via INTRAVENOUS

## 2015-06-10 MED ORDER — MORPHINE SULFATE 2 MG/ML IJ SOLN
INTRAMUSCULAR | Status: AC
  Filled 2015-06-10: qty 1

## 2015-06-10 MED ORDER — SUCRALFATE 1 GM/10ML PO SUSP
1.0000 g | Freq: Three times a day (TID) | ORAL | Status: DC
  Filled 2015-06-10: qty 10

## 2015-06-10 MED ORDER — ONDANSETRON HCL 4 MG/2ML IJ SOLN
4.0000 mg | INTRAMUSCULAR | Status: DC | PRN
  Administered 2015-06-10 – 2015-06-11 (×3): 4 mg via INTRAVENOUS
  Filled 2015-06-10 (×3): qty 2

## 2015-06-10 MED ORDER — MORPHINE SULFATE 2 MG/ML IJ SOLN
0.5000 mg | Freq: Once | INTRAMUSCULAR | Status: DC
  Filled 2015-06-10: qty 1

## 2015-06-10 MED ORDER — SUCRALFATE 1 GM/10ML PO SUSP
1.0000 g | Freq: Four times a day (QID) | ORAL | Status: DC
  Administered 2015-06-10 – 2015-06-12 (×8): 1 g via ORAL
  Filled 2015-06-10 (×8): qty 10

## 2015-06-10 MED ORDER — BUTALBITAL-APAP-CAFFEINE 50-325-40 MG PO TABS
1.0000 | ORAL_TABLET | Freq: Four times a day (QID) | ORAL | Status: DC | PRN
  Administered 2015-06-10 – 2015-06-12 (×6): 1 via ORAL
  Filled 2015-06-10 (×6): qty 1

## 2015-06-10 MED ORDER — BUTAMBEN-TETRACAINE-BENZOCAINE 2-2-14 % EX AERO
INHALATION_SPRAY | CUTANEOUS | Status: DC | PRN
  Administered 2015-06-10: 2 via TOPICAL

## 2015-06-10 MED ORDER — BUTALBITAL-APAP-CAFFEINE 50-325-40 MG PO TABS: 1.0000 | ORAL_TABLET | Freq: Four times a day (QID) | ORAL | Status: DC | PRN

## 2015-06-10 MED ORDER — DIAZEPAM 5 MG PO TABS
5.0000 mg | ORAL_TABLET | Freq: Three times a day (TID) | ORAL | Status: DC | PRN
  Administered 2015-06-10 – 2015-06-12 (×6): 5 mg via ORAL
  Filled 2015-06-10 (×6): qty 1

## 2015-06-10 MED ORDER — MIDAZOLAM HCL 5 MG/ML IJ SOLN
INTRAMUSCULAR | Status: AC
  Filled 2015-06-10: qty 3

## 2015-06-10 MED ORDER — ENSURE ENLIVE PO LIQD
237.0000 mL | Freq: Two times a day (BID) | ORAL | Status: DC
  Administered 2015-06-11 – 2015-06-12 (×2): 237 mL via ORAL

## 2015-06-10 MED ORDER — ENOXAPARIN SODIUM 40 MG/0.4ML ~~LOC~~ SOLN
40.0000 mg | SUBCUTANEOUS | Status: DC
  Administered 2015-06-10 – 2015-06-12 (×3): 40 mg via SUBCUTANEOUS
  Filled 2015-06-10 (×3): qty 0.4

## 2015-06-10 MED ORDER — AMLODIPINE BESYLATE 10 MG PO TABS: 10.0000 mg | ORAL_TABLET | Freq: Every day | ORAL | Status: DC

## 2015-06-10 MED ORDER — MORPHINE SULFATE 15 MG PO TABS
30.0000 mg | ORAL_TABLET | ORAL | Status: DC | PRN
  Administered 2015-06-10 – 2015-06-12 (×14): 30 mg via ORAL
  Filled 2015-06-10 (×14): qty 2

## 2015-06-10 MED ORDER — DIAZEPAM 5 MG PO TABS: 5.0000 mg | ORAL_TABLET | Freq: Three times a day (TID) | ORAL | Status: DC | PRN

## 2015-06-10 NOTE — Op Note (Signed)
[facility] [place], [id number]   ENDOSCOPY PROCEDURE REPORT  PATIENT: Jeremiah Anderson, Jeremiah Anderson  MR#: [id number] BIRTHDATE: 01/17/1966 , 49  yrs. old GENDER: male ENDOSCOPIST: Lafayette Dragon, MD REFERRED BY:  Dr Eulogio Bear PROCEDURE DATE:  06/10/2015 PROCEDURE:  EGD w/ biopsy ASA CLASS:     Class III INDICATIONS:  epigastric pain, nausea, and vomiting. MEDICATIONS: Fentanyl 100 mcg IV, Versed 7 mg IV, and Benadryl 25 mg IV TOPICAL ANESTHETIC: Cetacaine Spray  DESCRIPTION OF PROCEDURE: After the risks benefits and alternatives of the procedure were thoroughly explained, informed consent was obtained.  The PENTAX GASTOROSCOPE [id number] endoscope was introduced through the mouth and advanced to the second portion of the duodenum , Without limitations.  The instrument was slowly withdrawn as the mucosa was fully examined.      ESOPHAGUS: There was LA Class A esophagitis (One or more mucosal breaks < 5 mm in maximal length) noted.,is no stricture or hiatal hernia  STOMACH: The mucosa of the stomach appeared normal.  Multiple biopsies were performed using cold forceps.  Sample obtained for helicobacter pylori testing.  DUODENUM: Three non-bleeding ranging between 5-75mm in size non-bleeding, clean-based, irregular shaped and shallow ulcers with surrounding edema were found in the 1st part of the duodenum. duodenal outlet was widely patent. Duodenal bulb was not deformed Retroflexed views revealed no abnormalities.     The scope was then withdrawn from the patient and the procedure completed.  COMPLICATIONS: There were no immediate complications.  ENDOSCOPIC IMPRESSION: 1.   There was LA Class A esophagitis noted 2.   The mucosa of the stomach appeared normal; multiple biopsies were performed to rule out H. pylori 3.   Three non-bleeding ulcers ranging between 5-51mm in size  were found in the 1st part of the duodenum ,no stigmata of  recent bleeding. No evidence of outlet obstruction, the ulcers appeared acute  RECOMMENDATIONS: 1.  Await pathology results 2.  Liquid diet for next 24 hours 3.Add Carafate slurry 10 cc by mouth 4 times a day 4. Await H.  pylori testing 5. Avoid anti-inflammatory agents  REPEAT EXAM: for EGD pending biopsy results.  eSigned:  Lafayette Dragon, MD 06/10/2015 4:03 PM    CC:  PATIENT NAME:  Benecio, Kluger MR#: [id number]

## 2015-06-10 NOTE — H&P (Signed)
[facility] History and Physical  Patient: Jeremiah Anderson  MRN: [id number]  DOB: 1966-08-28  DOS: the patient was seen and examined on 06/10/2015 PCP: [name], FNP  Referring physician: Dr Kathrynn Humble Chief Complaint: Nausea and vomiting  HPI: Jeremiah Anderson is a 49 y.o. male with Past medical history of chronic back pain with multiple back surgeries, hypertension, GERD, recent RMSF. The patient is presenting with complaints of nausea and vomiting ongoing since last 2 weeks The patient as been treated for RMSF with doxycycline and completed a course 2 weeks ago. Since then he has been having nausea and vomiting. He has been evaluated by PT is an outpatient and was started on omeprazole Carafate as well as Zofran despite which she did not improve and therefore he came to the hospital again. He denies any fever or chills and complains of some dizziness and lightheadedness as well as he Denies any diarrhea or constipation. Denies any chest pain shortness of breath or cough. Complains of some epigastric pain. Denies any other recent change in medications. Denies any alcohol abuse denies any other drug abuse. He mentions he occasionally smokes.  The patient is coming from home.  At his baseline ambulates without any support And is independent for most of his ADL manages her medication on his own.  Review of Systems: as mentioned in the history of present illness.  A comprehensive review of the other systems is negative.  Past Medical History  Diagnosis Date  . Arthritis   . Hypertension   . Migraines   . GERD (gastroesophageal reflux disease)   . Hyperlipidemia    Past Surgical History  Procedure Laterality Date  . Spine surgery    . Appendectomy    . Spinal fusion    . Back surgery      8 surgeries total - fused from L1-S1  . 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   . Knee surgery      Multiple bilateral scopes   Social History:   reports that he has been smoking Cigarettes.  He has never used smokeless tobacco. He reports that he drinks alcohol. He reports that he does not use illicit drugs.  Allergies  Allergen Reactions  . Fentanyl     Hallucinations   . Lisinopril Swelling  . Lyrica [Pregabalin]     hallucinations  . Sulfa Antibiotics Swelling    Family History  Problem Relation Age of Onset  . Arthritis Mother   . Hyperlipidemia Mother   . Hypertension Father     Prior to Admission medications   Medication Sig Start Date End Date Taking? Authorizing Provider  amLODipine (NORVASC) 10 MG tablet Take 0.5 tablets (5 mg total) by mouth daily. Patient taking differently: Take 10 mg by mouth daily.  05/27/15  Yes Golden Circle, FNP  butalbital-acetaminophen-caffeine (FIORICET, ESGIC) [phone] MG per tablet TAKE ONE TABLET BY MOUTH TWICE DAILY AS NEEDED FOR HEADACHE 05/29/15  Yes Golden Circle, FNP  methocarbamol (ROBAXIN) 750 MG tablet Take 750 mg by mouth 4 (four) times daily.   Yes Historical Provider, MD  morphine (MSIR) 30 MG tablet Take 30 mg by mouth every 4 (four) hours as needed for severe pain.   Yes Historical Provider, MD  omeprazole (PRILOSEC) 20 MG capsule Take 1 capsule (20 mg total) by mouth daily. 05/27/15  Yes Golden Circle, FNP  ondansetron (ZOFRAN) 4 MG tablet Take 1 tablet (4 mg total) by mouth every  8 (eight) hours as needed for nausea or vomiting. 05/27/15  Yes Golden Circle, FNP    Physical Exam: Filed Vitals:   06/10/15 0030 06/10/15 0037 06/10/15 0110 06/10/15 0528  BP: 150/85 150/85 160/93 142/88  Pulse: 56 51 58 80  Temp:   100.6 F (38.1 C) 100.6 F (38.1 C)  TempSrc:   Oral Oral  Resp:  18 20 20   SpO2: 100% 97% 100% 99%    General: Alert, Awake and Oriented to Time, Place and Person. Appear in mild distress Eyes: PERRL ENT: Oral Mucosa clear moist. Neck: no JVD Cardiovascular: S1 and S2 Present, no Murmur, Peripheral Pulses Present Respiratory: Bilateral Air  entry equal and Decreased,  Clear to Auscultation, no Crackles, no wheezes Abdomen: Bowel Sound present, Soft and epigastric tender Skin: no Rash Extremities: no Pedal edema, no calf tenderness Neurologic: Grossly no focal neuro deficit.  Labs on Admission:  CBC:  Recent Labs Lab 06/09/15 1542  WBC 11.3*  HGB 14.7  HCT 41.3  MCV 82.8  PLT 309    CMP     Component Value Date/Time   NA 133* 06/09/2015 1542   K 3.6 06/09/2015 1542   CL 97* 06/09/2015 1542   CO2 23 06/09/2015 1542   GLUCOSE 125* 06/09/2015 1542   BUN 10 06/09/2015 1542   CREATININE 0.89 06/09/2015 1542   CALCIUM 9.6 06/09/2015 1542   PROT 6.9 06/09/2015 1542   ALBUMIN 4.5 06/09/2015 1542   AST 29 06/09/2015 1542   ALT 20 06/09/2015 1542   ALKPHOS 69 06/09/2015 1542   BILITOT 1.1 06/09/2015 1542   GFRNONAA >60 06/09/2015 1542   GFRAA >60 06/09/2015 1542     Recent Labs Lab 06/09/15 1542  LIPASE 25    No results for input(s): CKTOTAL, CKMB, CKMBINDEX, TROPONINI in the last 168 hours. BNP (last 3 results) No results for input(s): BNP in the last 8760 hours.  ProBNP (last 3 results) No results for input(s): PROBNP in the last 8760 hours.   Radiological Exams on Admission: Ct Abdomen Pelvis W Contrast  06/09/2015   CLINICAL DATA:  Acute onset of epigastric abdominal pain and vomiting. Initial encounter.  EXAM: CT ABDOMEN AND PELVIS WITH CONTRAST  TECHNIQUE: Multidetector CT imaging of the abdomen and pelvis was performed using the standard protocol following bolus administration of intravenous contrast.  CONTRAST:  159mL OMNIPAQUE IOHEXOL 300 MG/ML  SOLN  COMPARISON:  None.  FINDINGS: The visualized lung bases are clear.  A few scattered small hypodensities within the liver measure up to 6 mm in size. The liver and spleen are otherwise unremarkable. The gallbladder is within normal limits. The pancreas and adrenal glands are unremarkable.  A few small right renal cysts are noted. The kidneys are  otherwise unremarkable. There is no evidence of hydronephrosis. No renal or ureteral stones are seen. No perinephric stranding is appreciated.  No free fluid is identified. The small bowel is unremarkable in appearance. The stomach is within normal limits. No acute vascular abnormalities are seen.  The patient is status post appendectomy. The colon is unremarkable in appearance.  The bladder is mildly distended and grossly remarkable. The prostate is normal in size. No inguinal lymphadenopathy is seen.  No acute osseous abnormalities are identified. The patient is status post lumbar spinal fusion at L1-S1. Underlying degenerative change and decompression are noted.  IMPRESSION: 1. No acute abnormality seen within the abdomen or pelvis. 2. Few small right renal cysts noted. Likely tiny hepatic cysts seen. 3. Mild  degenerative change along the lumbar spine, with underlying fusion at L1-S1.   Electronically Signed   By: Garald Balding M.D.   On: 06/09/2015 21:28   Assessment/Plan Principal Problem:   Intractable nausea and vomiting Active Problems:   Essential hypertension   Pill-induced gastritis   Chronic back pain   1. Intractable nausea and vomiting The patient is presenting with complaints of intractable nausea and vomiting associated with epigastric tenderness. CT scan of the abdomen does not show any acute intra-abdominal pathology. Although patient did fail by mouth challenge in the ER. With this the patient will be admitted in the hospital for observation. I will give him IV hydration. IV Zofran as needed for nausea. We will use a his regular home medications for chronic pain. I would also put him on twice a day PPI with Protonix. Also use Carafate. Most likely associated with pill-induced gastritis with doxycycline.  2. Chronic back pain. Patient is requiring significant amount of morphine for pain control. At present I'll continue home doses of morphine and will only use IV morphine  if the patient has severe pain not improving with oral morphine. Allergies muscle relaxants from Robaxin to Valium which would also help with anxiety as well as nausea.  Advance goals of care discussion: Full code   DVT Prophylaxis: subcutaneous Heparin Nutrition: Liquid diet  Family Communication: family was present at bedside, opportunity was given to ask question and all questions were answered satisfactorily at the time of interview. Disposition: Admitted as observation Author: Berle Mull, MD [facility] Pager: [phone] 06/10/2015  If 7PM-7AM, please contact night-coverage [email or web address] Password [email or web address]

## 2015-06-10 NOTE — Consult Note (Signed)
[facility] Consult: 12:06 PM 06/10/2015     Referring Provider: Dr Eliseo Squires  Primary Care Physician:  [name], [place] Primary Gastroenterologist:  None.  Had appt to see NP Chester Holstein in office 7/21    Reason for Consultation:  Abdominal pain, vomiting.    HPI: Jeremiah Anderson is a 49 y.o. male.  Disabled due to [place] issues.  S/p spinal/knee/shoulder surgeries, chronic back pain on narcotics.  S/p appendectomy complicated by post op bleeding requiring ex lap.  S/p ex lap with ligation of blood vessel "outside" his stomach after he had retched really hard.  Htn. Treated for ? RMSF with Doxycycline starting 6/16 (tick bite leading to rash 2 weeks prior).  Finished abx on ~ 6/25.  However serologies negative for RMSF. Papular rash improved; headaches, abdominal pain (epigastric burning), nausea body aches persisted.  Omeprazole and prn Zofran started on 7/6.  Same day Amlodipine dose increased for undercontrolled htn.  Set up for GI appt 7/21.   To ED last night due to persistent vomiting, unable to keep meds/po down. Admitted for obs.  Emesis is bilious.  Na 133, chloride 97,  BUN/creat ok. Glucose 125.  WBCs to 11.8. LFTs, lipase normal.  CT:  tiny hepatic hypodensities (likely cysts), small renal cysts, lumbar spinal DJD   Takes 800 mg Ibuprofen prn, used 4 to 5 times in last 3 weeks.  Tylenol migraine about 3 times in last few weeks.  Takes 6 to 7 of his MSIR daily. No ETOH.  Daily BMs: brown, last was yesterday.   Past Medical History  Diagnosis Date  . Arthritis   . Hypertension   . Migraines   . GERD (gastroesophageal reflux disease)   . Hyperlipidemia     Past Surgical History  Procedure Laterality Date  . Appendectomy  1980s    post op bleeding sent him back for ex lap and hemostatic  intervention.   . Back surgery      8 surgeries total - fused from L1-S1  . 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   . Knee surgery      Multiple bilateral scopes  . Ex lap with peri gastric vessel ligation  1990s    after retching, he rupture a blood vessel "outside" the stomach.  vessel was ligated per pt's descriiption.     Prior to Admission medications   Medication Sig Start Date End Date Taking? Authorizing Provider  amLODipine (NORVASC) 10 MG tablet Take 0.5 tablets (5 mg total) by mouth daily. Patient taking differently: Take 10 mg by mouth daily.  05/27/15  Yes Golden Circle, FNP  butalbital-acetaminophen-caffeine (FIORICET, ESGIC) [phone] MG per tablet TAKE ONE TABLET BY MOUTH TWICE DAILY AS NEEDED FOR HEADACHE 05/29/15  Yes Golden Circle, FNP  methocarbamol (ROBAXIN) 750 MG tablet Take 750 mg by mouth 4 (four) times daily.   Yes Historical Provider, MD  morphine (MSIR) 30 MG tablet Take 30 mg by mouth every 4 (four) hours as needed for severe pain.  Yes Historical Provider, MD  omeprazole (PRILOSEC) 20 MG capsule Take 1 capsule (20 mg total) by mouth daily. 05/27/15  Yes Golden Circle, FNP  ondansetron (ZOFRAN) 4 MG tablet Take 1 tablet (4 mg total) by mouth every 8 (eight) hours as needed for nausea or vomiting. 05/27/15  Yes Golden Circle, FNP    Scheduled Meds: . amLODipine  10 mg Oral Daily  . enoxaparin (LOVENOX) injection  40 mg Subcutaneous Q24H  . feeding supplement (ENSURE ENLIVE)  237 mL Oral BID BM  . pantoprazole (PROTONIX) IV  40 mg Intravenous Q12H  . sucralfate  1 g Oral TID WC & HS   Infusions: . sodium chloride 100 mL/hr at 06/10/15 0229   PRN Meds: acetaminophen, butalbital-acetaminophen-caffeine, diazepam, morphine, morphine injection, ondansetron **OR** ondansetron (ZOFRAN) IV   Allergies as of 06/09/2015 - Review Complete 06/09/2015  Allergen Reaction Noted  . Fentanyl  02/16/2015  . Lisinopril Swelling  02/16/2015  . Lyrica [pregabalin]  02/16/2015  . Sulfa antibiotics Swelling 02/16/2015    Family History  Problem Relation Age of Onset  . Arthritis Mother   . Hyperlipidemia Mother   . Hypertension Father     History   Social History  . Marital Status: Unknown    Spouse Name: N/A  . Number of Children: 0  . Years of Education: 16   Occupational History  . Disability     Former Therapist, sports   Social History Main Topics  . Smoking status: Current Some Day Smoker    Types: Cigarettes  . Smokeless tobacco: Never Used  . Alcohol Use: 0.0 oz/week    0 Standard drinks or equivalent per week     Comment: rarely  . Drug Use: No  . Sexual Activity: Not on file   Other Topics Concern  . Not on file   Social History Narrative   Fun: play guitar / limited funds      Denies any religious beliefs effecting health care.     REVIEW OF SYSTEMS: Constitutional:  Weight stable ENT:  No nose bleeds Pulm:  No cough or dyspnea CV:  No palpitations, no LE edema.  GU:  No hematuria, no frequency GI:  Per HPI Heme:  Anemia associated with surgeries, nothing chronic   Transfusions:  In past, with surgeries.  Neuro:  No headaches, no peripheral tingling or numbness Derm:  No itching, no rash or sores.  Endocrine:  No sweats or chills.  No polyuria or dysuria Immunization:  Not queried.    PHYSICAL EXAM: Vital signs in last 24 hours: Filed Vitals:   06/10/15 0941  BP: 150/58  Pulse: 53  Temp: 99.2 F (37.3 C)  Resp: 20   Wt Readings from Last 3 Encounters:  06/10/15 176 lb 9.4 oz (80.1 kg)  05/27/15 184 lb (83.462 kg)  05/13/15 179 lb 1.9 oz (81.248 kg)    General: anxious, pleasant, comfortable, alert.  Head:  No asymmetry or edema  Eyes:  No icterus or pallor Ears:  Not HOH  Nose:  No congestion or discharge Mouth:  Clear, somewhat dry mucosa. Neck:  No mass, no jvd, no tmg Lungs:  Clear bil.  Unlabored breathing.  Heart: RRR.  No MRG Abdomen:  Soft, NT, ND.  BS active.   No mass, hernia or bruit.   Rectal: deferred   Musc/Skeltl: no joint contractures or swelling Extremities:  No CCE  Neurologic:  Oriented x 3.  No limb weakness, no tremor.  Skin:  No telangectasia or  rash Nodes:  No cervical or inguinal adenopathy   Psych:  Anxious, cooperative, good historian.   Intake/Output from previous day: 07/19 0701 - 07/20 0700 In: -  Out: 403 [Urine:400; Emesis/NG output:3] Intake/Output this shift:    LAB RESULTS:  Recent Labs  06/09/15 1542 06/10/15 0540  WBC 11.3* 11.8*  HGB 14.7 13.5  HCT 41.3 38.3*  PLT 309 250   BMET Lab Results  Component Value Date   NA 133* 06/10/2015   NA 133* 06/09/2015   NA 132* 05/08/2015   K 3.6 06/10/2015   K 3.6 06/09/2015   K 3.9 05/08/2015   CL 99* 06/10/2015   CL 97* 06/09/2015   CL 95* 05/08/2015   CO2 23 06/10/2015   CO2 23 06/09/2015   CO2 29 05/08/2015   GLUCOSE 112* 06/10/2015   GLUCOSE 125* 06/09/2015   GLUCOSE 105* 05/08/2015   BUN 8 06/10/2015   BUN 10 06/09/2015   BUN 9 05/08/2015   CREATININE 0.71 06/10/2015   CREATININE 0.89 06/09/2015   CREATININE 0.86 05/08/2015   CALCIUM 8.6* 06/10/2015   CALCIUM 9.6 06/09/2015   CALCIUM 8.9 05/08/2015   LFT  Recent Labs  06/09/15 1542 06/10/15 0540  PROT 6.9 6.0*  ALBUMIN 4.5 3.8  AST 29 27  ALT 20 20  ALKPHOS 69 57  BILITOT 1.1 1.2   PT/INR Lab Results  Component Value Date   INR 1.06 06/10/2015   Hepatitis Panel No results for input(s): HEPBSAG, HCVAB, HEPAIGM, HEPBIGM in the last 72 hours. C-Diff No components found for: CDIFF Lipase     Component Value Date/Time   LIPASE 25 06/09/2015 1542    Drugs of Abuse  No results found for: LABOPIA, COCAINSCRNUR, LABBENZ, AMPHETMU, THCU, LABBARB   RADIOLOGY STUDIES: Ct Abdomen Pelvis W Contrast  06/09/2015   CLINICAL DATA:  Acute onset of epigastric abdominal pain and vomiting. Initial encounter.  EXAM: CT ABDOMEN AND PELVIS WITH CONTRAST  TECHNIQUE: Multidetector CT imaging  of the abdomen and pelvis was performed using the standard protocol following bolus administration of intravenous contrast.  CONTRAST:  163mL OMNIPAQUE IOHEXOL 300 MG/ML  SOLN  COMPARISON:  None.  FINDINGS: The visualized lung bases are clear.  A few scattered small hypodensities within the liver measure up to 6 mm in size. The liver and spleen are otherwise unremarkable. The gallbladder is within normal limits. The pancreas and adrenal glands are unremarkable.  A few small right renal cysts are noted. The kidneys are otherwise unremarkable. There is no evidence of hydronephrosis. No renal or ureteral stones are seen. No perinephric stranding is appreciated.  No free fluid is identified. The small bowel is unremarkable in appearance. The stomach is within normal limits. No acute vascular abnormalities are seen.  The patient is status post appendectomy. The colon is unremarkable in appearance.  The bladder is mildly distended and grossly remarkable. The prostate is normal in size. No inguinal lymphadenopathy is seen.  No acute osseous abnormalities are identified. The patient is status post lumbar spinal fusion at L1-S1. Underlying degenerative change and decompression are noted.  IMPRESSION: 1. No acute abnormality seen within the abdomen or pelvis. 2. Few small right renal cysts noted. Likely tiny hepatic cysts seen. 3. Mild degenerative change along the lumbar spine, with underlying fusion at L1-S1.   Electronically Signed   By: Garald Balding M.D.   On: 06/09/2015 21:28    ENDOSCOPIC STUDIES: EGD in his 80s, nothing found per pt Colonoscopy for BPR in his 18s,  nothing found per pt  IMPRESSION:   *  Epigastric pain with n/v.  Pain started during course of doxycycline.  ? Esophagitis vs gastritis vs ulcers.  Narcotics withdrawal may be at play in recent days as vomiting all po.   *  Narcotics dependent for chronic MS pain.  S/p multiple orthopedic/spinal surgeries.   *  Hx of ruptured blood vessel,  near stomach.  Occurred after retching.  Required ex lap and ligation of bleeder in 1990s.      PLAN:     *  EGD.  May be able to get this done today, o/w will plan for tomorrow.  Keep NPO for now.   Stop Carafate.    Azucena Freed  06/10/2015, 12:06 PM Pager: [phone] Attending MD note:   I have taken a history, examined the patient, and reviewed the chart. I agree with the Advanced Practitioner's impression and recommendations. Intractable vomiting, r/o GOO. Please see EGD report.  [facility] Pager # [phone]

## 2015-06-10 NOTE — Progress Notes (Signed)
Patient admitted after midnight- please see H&P.  Has appointment to see GI in AM- called them to see in the hospital.  Suspect viral or pill induced gastritis  Eulogio Bear DO

## 2015-06-10 NOTE — Progress Notes (Signed)
Pt arrived to room 4N26 from ED.  Pt states back pain 5/10, MD aware and new orders given. Pt is alert and oriented.  Safety measures in place.  Will continue to monitor.   Fredrich Romans, RN

## 2015-06-11 ENCOUNTER — Ambulatory Visit: Payer: Medicare Other | Admitting: Nurse Practitioner

## 2015-06-11 ENCOUNTER — Encounter (HOSPITAL_COMMUNITY): Payer: Self-pay | Admitting: Internal Medicine

## 2015-06-11 ENCOUNTER — Encounter: Payer: Self-pay | Admitting: Internal Medicine

## 2015-06-11 DIAGNOSIS — K2981 Duodenitis with bleeding: Secondary | ICD-10-CM | POA: Diagnosis not present

## 2015-06-11 DIAGNOSIS — K2950 Unspecified chronic gastritis without bleeding: Secondary | ICD-10-CM | POA: Diagnosis not present

## 2015-06-11 DIAGNOSIS — I10 Essential (primary) hypertension: Secondary | ICD-10-CM | POA: Diagnosis not present

## 2015-06-11 DIAGNOSIS — R1110 Vomiting, unspecified: Secondary | ICD-10-CM | POA: Diagnosis not present

## 2015-06-11 DIAGNOSIS — M549 Dorsalgia, unspecified: Secondary | ICD-10-CM | POA: Diagnosis not present

## 2015-06-11 MED ORDER — PANTOPRAZOLE SODIUM 40 MG PO TBEC
40.0000 mg | DELAYED_RELEASE_TABLET | Freq: Two times a day (BID) | ORAL | Status: DC
  Administered 2015-06-11 – 2015-06-12 (×2): 40 mg via ORAL
  Filled 2015-06-11 (×2): qty 1

## 2015-06-11 MED ORDER — MORPHINE SULFATE 2 MG/ML IJ SOLN: 1.0000 mg | Freq: Three times a day (TID) | INTRAMUSCULAR | Status: DC | PRN

## 2015-06-11 NOTE — Progress Notes (Signed)
Daily Rounding Note  06/11/2015, 8:37 AM    SUBJECTIVE:       Tolerating full liquid diet.  Pain and nausea better.   OBJECTIVE:         Vital signs in last 24 hours:    Temp:  [98.6 F (37 C)-100.4 F (38 C)] 100.4 F (38 C) (07/21 0536) Pulse Rate:  [49-127] 68 (07/21 0536) Resp:  [12-22] 18 (07/21 0536) BP: (118-178)/(58-111) 128/73 mmHg (07/21 0536) SpO2:  [96 %-100 %] 97 % (07/21 0536) Weight:  [175 lb 11.3 oz (79.7 kg)] 175 lb 11.3 oz (79.7 kg) (07/21 0500) Last BM Date: 06/08/15 Filed Weights   06/10/15 0500 06/11/15 0500  Weight: 176 lb 9.4 oz (80.1 kg) 175 lb 11.3 oz (79.7 kg)   General: looks well.  comfortable   Heart: RRR Chest: clear bil.  No cough or dyspnea Abdomen: soft, ND.  Active BS.  Slight, non focal tenderness Extremities: no CCE Neuro/Psych:  Pleasant, oriented x 3. Fully alert.   Intake/Output from previous day:    Intake/Output this shift:    Lab Results:  Recent Labs  06/09/15 1542 06/10/15 0540  WBC 11.3* 11.8*  HGB 14.7 13.5  HCT 41.3 38.3*  PLT 309 250   BMET  Recent Labs  06/09/15 1542 06/10/15 0540  NA 133* 133*  K 3.6 3.6  CL 97* 99*  CO2 23 23  GLUCOSE 125* 112*  BUN 10 8  CREATININE 0.89 0.71  CALCIUM 9.6 8.6*   LFT  Recent Labs  06/09/15 1542 06/10/15 0540  PROT 6.9 6.0*  ALBUMIN 4.5 3.8  AST 29 27  ALT 20 20  ALKPHOS 69 57  BILITOT 1.1 1.2   PT/INR  Recent Labs  06/10/15 0540  LABPROT 14.0  INR 1.06   Hepatitis Panel No results for input(s): HEPBSAG, HCVAB, HEPAIGM, HEPBIGM in the last 72 hours.  Studies/Results: Ct Abdomen Pelvis W Contrast  06/09/2015   CLINICAL DATA:  Acute onset of epigastric abdominal pain and vomiting. Initial encounter.  EXAM: CT ABDOMEN AND PELVIS WITH CONTRAST  TECHNIQUE: Multidetector CT imaging of the abdomen and pelvis was performed using the standard protocol following bolus administration of  intravenous contrast.  CONTRAST:  128mL OMNIPAQUE IOHEXOL 300 MG/ML  SOLN  COMPARISON:  None.  FINDINGS: The visualized lung bases are clear.  A few scattered small hypodensities within the liver measure up to 6 mm in size. The liver and spleen are otherwise unremarkable. The gallbladder is within normal limits. The pancreas and adrenal glands are unremarkable.  A few small right renal cysts are noted. The kidneys are otherwise unremarkable. There is no evidence of hydronephrosis. No renal or ureteral stones are seen. No perinephric stranding is appreciated.  No free fluid is identified. The small bowel is unremarkable in appearance. The stomach is within normal limits. No acute vascular abnormalities are seen.  The patient is status post appendectomy. The colon is unremarkable in appearance.  The bladder is mildly distended and grossly remarkable. The prostate is normal in size. No inguinal lymphadenopathy is seen.  No acute osseous abnormalities are identified. The patient is status post lumbar spinal fusion at L1-S1. Underlying degenerative change and decompression are noted.  IMPRESSION: 1. No acute abnormality seen within the abdomen or pelvis. 2. Few small right renal cysts noted. Likely tiny hepatic cysts seen. 3. Mild degenerative change along the lumbar spine, with underlying fusion at L1-S1.   Electronically Signed  By: Garald Balding M.D.   On: 06/09/2015 21:28   Scheduled Meds: . amLODipine  10 mg Oral Daily  . enoxaparin (LOVENOX) injection  40 mg Subcutaneous Q24H  . feeding supplement (ENSURE ENLIVE)  237 mL Oral BID BM  . pantoprazole (PROTONIX) IV  40 mg Intravenous Q12H  . sucralfate  1 g Oral 4 times per day   Continuous Infusions: . sodium chloride 100 mL/hr at 06/11/15 0231   PRN Meds:.acetaminophen, butalbital-acetaminophen-caffeine, diazepam, morphine, morphine injection, ondansetron **OR** ondansetron (ZOFRAN) IV   ASSESMENT:   *  N/V abdominal pain.  Non-bleeding duodenal  ulcers, mild esophagitis on EGD 7/20 On BID IV Protonix and qid Carafate.  sxs began after starting Doxycycline which is known to cause GI upset and esophageal ulcers but there are only rare case reports of gastric ulcer and none of duodenal ulcers.   *  Narcotics dependent, chronic pain.     PLAN   *  Await clotest results. Advance to solids. Decrease rate of IVF.  May be able to discharge home later today on BID PO PPI.     Azucena Freed  06/11/2015, 8:37 AM Pager: [phone]  Attending MD note:   I have taken a history, examined the patient, and reviewed the chart. I agree with the Advanced Practitioner's impression and recommendations. Feeling well today , less pain, no N&V, Duodenal ulcer. OK to DC today on a PPI and Carafate slurry ( about 3-4 days). No NSAIDs he does not need to see me unless abd pain persists.  [facility] Pager # [phone]

## 2015-06-11 NOTE — Progress Notes (Addendum)
PROGRESS NOTE  Jeremiah Anderson PPJ:[id number] DOB: 1966-05-10 DOA: 06/09/2015 PCP: [name], FNP  Assessment/Plan: N/v/epigastric pain due to duodenal ulcers found on EGD as well as esophagitis -GI following -liquid diet h pylori pending -avoid NSAIDs -protonix/carafate  Night fevers- low grade- no lymph nodes -recent RMSF -if fever continues- may need ID consult vs narcotic withdrawal fever -mildly elevated WBC-- trend  Chronic back pain. -continue home meds    Code Status: full Family Communication: patient Disposition Plan:    Consultants:  GI  Procedures:  EDG    HPI/Subjective: No SOB, no CP  Objective: Filed Vitals:   06/11/15 0536  BP: 128/73  Pulse: 68  Temp: 100.4 F (38 C)  Resp: 18   No intake or output data in the 24 hours ending 06/11/15 0929 Filed Weights   06/10/15 0500 06/11/15 0500  Weight: 80.1 kg (176 lb 9.4 oz) 79.7 kg (175 lb 11.3 oz)    Exam:   General:  A+Ox3, NAD  Cardiovascular: rrr  Respiratory: clear  Abdomen: +Bs, soft  Musculoskeletal: no edema   Data Reviewed: Basic Metabolic Panel:  Recent Labs Lab 06/09/15 1542 06/10/15 0540  NA 133* 133*  K 3.6 3.6  CL 97* 99*  CO2 23 23  GLUCOSE 125* 112*  BUN 10 8  CREATININE 0.89 0.71  CALCIUM 9.6 8.6*   Liver Function Tests:  Recent Labs Lab 06/09/15 1542 06/10/15 0540  AST 29 27  ALT 20 20  ALKPHOS 69 57  BILITOT 1.1 1.2  PROT 6.9 6.0*  ALBUMIN 4.5 3.8    Recent Labs Lab 06/09/15 1542  LIPASE 25   No results for input(s): AMMONIA in the last 168 hours. CBC:  Recent Labs Lab 06/09/15 1542 06/10/15 0540  WBC 11.3* 11.8*  HGB 14.7 13.5  HCT 41.3 38.3*  MCV 82.8 83.6  PLT 309 250   Cardiac Enzymes: No results for input(s): CKTOTAL, CKMB, CKMBINDEX, TROPONINI in the last 168 hours. BNP (last 3 results) No results for input(s): BNP in the last 8760 hours.  ProBNP (last 3 results) No results for input(s): PROBNP in the last  8760 hours.  CBG: No results for input(s): GLUCAP in the last 168 hours.  No results found for this or any previous visit (from the past 240 hour(s)).   Studies: Ct Abdomen Pelvis W Contrast  06/09/2015   CLINICAL DATA:  Acute onset of epigastric abdominal pain and vomiting. Initial encounter.  EXAM: CT ABDOMEN AND PELVIS WITH CONTRAST  TECHNIQUE: Multidetector CT imaging of the abdomen and pelvis was performed using the standard protocol following bolus administration of intravenous contrast.  CONTRAST:  143mL OMNIPAQUE IOHEXOL 300 MG/ML  SOLN  COMPARISON:  None.  FINDINGS: The visualized lung bases are clear.  A few scattered small hypodensities within the liver measure up to 6 mm in size. The liver and spleen are otherwise unremarkable. The gallbladder is within normal limits. The pancreas and adrenal glands are unremarkable.  A few small right renal cysts are noted. The kidneys are otherwise unremarkable. There is no evidence of hydronephrosis. No renal or ureteral stones are seen. No perinephric stranding is appreciated.  No free fluid is identified. The small bowel is unremarkable in appearance. The stomach is within normal limits. No acute vascular abnormalities are seen.  The patient is status post appendectomy. The colon is unremarkable in appearance.  The bladder is mildly distended and grossly remarkable. The prostate is normal in size. No inguinal lymphadenopathy is seen.  No acute osseous  abnormalities are identified. The patient is status post lumbar spinal fusion at L1-S1. Underlying degenerative change and decompression are noted.  IMPRESSION: 1. No acute abnormality seen within the abdomen or pelvis. 2. Few small right renal cysts noted. Likely tiny hepatic cysts seen. 3. Mild degenerative change along the lumbar spine, with underlying fusion at L1-S1.   Electronically Signed   By: Garald Balding M.D.   On: 06/09/2015 21:28    Scheduled Meds: . amLODipine  10 mg Oral Daily  .  enoxaparin (LOVENOX) injection  40 mg Subcutaneous Q24H  . feeding supplement (ENSURE ENLIVE)  237 mL Oral BID BM  . pantoprazole (PROTONIX) IV  40 mg Intravenous Q12H  . sucralfate  1 g Oral 4 times per day   Continuous Infusions: . sodium chloride 100 mL/hr at 06/11/15 0231   Antibiotics Given (last 72 hours)    None      Principal Problem:   Intractable nausea and vomiting Active Problems:   Essential hypertension   Pill-induced gastritis   Chronic back pain   Multiple duodenal ulcers    Time spent: 25 min    Corlene Sabia  [facility] Pager [phone]. If 7PM-7AM, please contact night-coverage at [email or web address], password [facility] 06/11/2015, 9:29 AM

## 2015-06-11 NOTE — Progress Notes (Signed)
Initial Nutrition Assessment  DOCUMENTATION CODES:   Not applicable  INTERVENTION:   Continue Ensure Enlive po BID, each supplement provides 350 kcal and 20 grams of protein.  Encourage adequate PO intake.   NUTRITION DIAGNOSIS:   Inadequate oral intake related to nausea as evidenced by  (varied meal completion 25-100%).  GOAL:   Patient will meet greater than or equal to 90% of their needs  MONITOR:   PO intake, Supplement acceptance, Weight trends, Labs, I & O's  REASON FOR ASSESSMENT:   Malnutrition Screening Tool    ASSESSMENT:   49 y.o. male with Past medical history of chronic back pain with multiple back surgeries, hypertension, GERD, recent RMSF. The patient is presenting with complaints of nausea and vomiting ongoing since last 2 weeks. N/v/epigastric pain due to duodenal ulcers found on EGD as well as esophagitis.  Pt reports having abdominal pains and a little nausea. Meal completion has been 25-100%. PTA pt reports eating well with no other difficulties. Per Epic weight records, pt with a 7.4% weight loss in 2 months. Pt currently has Ensure ordered. RD to continue with orders. Pt was encouraged to eat his food at meals and to drink his supplements.   Pt with mild muscle mass depletion in the clavicle region, however no other areas found to have significant fat or muscle mass loss.   Labs and medications reviewed.   Diet Order:  Diet regular Room service appropriate?: Yes; Fluid consistency:: Thin  Skin:  Reviewed, no issues  Last BM:  7/18  Height:   Ht Readings from Last 1 Encounters:  06/10/15 5\' 11"  (1.803 m)    Weight:   Wt Readings from Last 1 Encounters:  06/11/15 175 lb 11.3 oz (79.7 kg)    Ideal Body Weight:  78 kg  Wt Readings from Last 10 Encounters:  06/11/15 175 lb 11.3 oz (79.7 kg)  05/27/15 184 lb (83.462 kg)  05/13/15 179 lb 1.9 oz (81.248 kg)  05/07/15 180 lb (81.647 kg)  04/21/15 189 lb 6.4 oz (85.911 kg)  03/19/15 184 lb  12.8 oz (83.825 kg)  02/16/15 185 lb (83.915 kg)    BMI:  Body mass index is 24.52 kg/(m^2).  Estimated Nutritional Needs:   Kcal:  2000-2200  Protein:  95-110 grams  Fluid:  2- 2.2 L/day  EDUCATION NEEDS:   No education needs identified at this time  Corrin Parker, MS, RD, LDN Pager # [phone] After hours/ weekend pager # [phone]

## 2015-06-12 DIAGNOSIS — G43A1 Cyclical vomiting, intractable: Secondary | ICD-10-CM | POA: Diagnosis not present

## 2015-06-12 DIAGNOSIS — K2950 Unspecified chronic gastritis without bleeding: Secondary | ICD-10-CM | POA: Diagnosis not present

## 2015-06-12 LAB — CBC
HEMATOCRIT: 37.4 % — AB (ref 39.0–52.0)
HEMOGLOBIN: 12.9 g/dL — AB (ref 13.0–17.0)
MCH: 29 pg (ref 26.0–34.0)
MCHC: 34.5 g/dL (ref 30.0–36.0)
MCV: 84 fL (ref 78.0–100.0)
PLATELETS: 199 10*3/uL (ref 150–400)
RBC: 4.45 MIL/uL (ref 4.22–5.81)
RDW: 12.6 % (ref 11.5–15.5)
WBC: 6 10*3/uL (ref 4.0–10.5)

## 2015-06-12 LAB — BASIC METABOLIC PANEL
Anion gap: 7 (ref 5–15)
BUN: 11 mg/dL (ref 6–20)
CALCIUM: 8.6 mg/dL — AB (ref 8.9–10.3)
CO2: 31 mmol/L (ref 22–32)
CREATININE: 0.79 mg/dL (ref 0.61–1.24)
Chloride: 101 mmol/L (ref 101–111)
GFR calc non Af Amer: >60 mL/min (ref >60)
GLUCOSE: 104 mg/dL — AB (ref 65–99)
Potassium: 4.4 mmol/L (ref 3.5–5.1)
Sodium: 139 mmol/L (ref 135–145)

## 2015-06-12 MED ORDER — PANTOPRAZOLE SODIUM 40 MG PO TBEC: 40.0000 mg | DELAYED_RELEASE_TABLET | Freq: Two times a day (BID) | ORAL | Status: DC

## 2015-06-12 MED ORDER — ONDANSETRON HCL 4 MG PO TABS: 4.0000 mg | ORAL_TABLET | Freq: Three times a day (TID) | ORAL | Status: DC | PRN

## 2015-06-12 MED ORDER — SUCRALFATE 1 G PO TABS: 1.0000 g | ORAL_TABLET | Freq: Three times a day (TID) | ORAL | Status: DC

## 2015-06-12 NOTE — Progress Notes (Signed)
Discharge orders received. Pt educated on discharge instructions and verbalized understanding. Pt given discharge packet and prescriptions. Pt taken downstairs by staff via wheelchair.

## 2015-06-12 NOTE — Discharge Summary (Signed)
Physician Discharge Summary  Jeremiah Anderson WVP:[id number] DOB: 16-Oct-1966 DOA: 06/09/2015  PCP: [name], FNP  Admit date: 06/09/2015 Discharge date: 06/12/2015  Time spent: greater than 30 minutes  Recommendations for Outpatient Follow-up:   Discharge Diagnoses:  Principal Problem:   Intractable nausea and vomiting Active Problems:   Essential hypertension   Pill-induced gastritis   Chronic back pain   Multiple duodenal ulcers   Discharge Condition: stable  Diet recommendation: heart healthy  Filed Weights   06/10/15 0500 06/11/15 0500 06/12/15 0358  Weight: 80.1 kg (176 lb 9.4 oz) 79.7 kg (175 lb 11.3 oz) 81.5 kg (179 lb 10.8 oz)    History of present illness:  49 y.o. male with Past medical history of chronic back pain with multiple back surgeries, hypertension, GERD, recent RMSF. The patient is presenting with complaints of nausea and vomiting ongoing since last 2 weeks The patient as been treated for RMSF with doxycycline and completed a course 2 weeks ago. Since then he has been having nausea and vomiting. He has been evaluated by PT is an outpatient and was started on omeprazole Carafate as well as Zofran despite which she did not improve and therefore he came to the hospital again. He denies any fever or chills and complains of some dizziness and lightheadedness as well as he Denies any diarrhea or constipation. Denies any chest pain shortness of breath or cough. Complains of some epigastric pain. Denies any other recent change in medications. Denies any alcohol abuse denies any other drug abuse. He mentions he occasionally smokes.  Hospital Course:  N/v/epigastric pain due to duodenal ulcers found on EGD as well as esophagitis By discharge, tolerating solid diet CLO negative -avoid NSAIDs -protonix at discharge, a few more days carafate  Chronic back pain. -continue home meds  Consultants:  GI  Procedures:  EGD  Discharge Exam: Filed Vitals:    06/12/15 1019  BP: 106/67  Pulse: 69  Temp: 98.7 F (37.1 C)  Resp: 18    General: a and o  Cardiovascular: RRR Respiratory: CTA Abd: s, nt, nd  Discharge Instructions    Current Discharge Medication List    START taking these medications   Details  pantoprazole (PROTONIX) 40 MG tablet Take 1 tablet (40 mg total) by mouth 2 (two) times daily. Qty: 60 tablet, Refills: 0    sucralfate (CARAFATE) 1 G tablet Take 1 tablet (1 g total) by mouth 4 (four) times daily -  with meals and at bedtime. For 3 days Qty: 12 tablet, Refills: 0      CONTINUE these medications which have NOT CHANGED   Details  amLODipine (NORVASC) 10 MG tablet Take 0.5 tablets (5 mg total) by mouth daily. Qty: 30 tablet, Refills: 2   Associated Diagnoses: Essential hypertension    butalbital-acetaminophen-caffeine (FIORICET, ESGIC) 50-325-40 MG per tablet TAKE ONE TABLET BY MOUTH TWICE DAILY AS NEEDED FOR HEADACHE Qty: 30 tablet, Refills: 0    methocarbamol (ROBAXIN) 750 MG tablet Take 750 mg by mouth 4 (four) times daily.    morphine (MSIR) 30 MG tablet Take 30 mg by mouth every 4 (four) hours as needed for severe pain.    ondansetron (ZOFRAN) 4 MG tablet Take 1 tablet (4 mg total) by mouth every 8 (eight) hours as needed for nausea or vomiting. Qty: 20 tablet, Refills: 0   Associated Diagnoses: Non-intractable vomiting with nausea, vomiting of unspecified type      STOP taking these medications     omeprazole (PRILOSEC) 20 MG  capsule        Allergies  Allergen Reactions  . Fentanyl     Hallucinations   . Lisinopril Swelling  . Lyrica [Pregabalin]     hallucinations  . Sulfa Antibiotics Swelling   Follow-up Information    Follow up with [name], FNP.   Specialty:  Family Medicine   Why:  as previously scheduled   Contact information:   [place] [id number] [phone]       Follow up with Delfin Edis, MD.   Specialty:  Gastroenterology   Why:  As needed  if symptoms persist   Contact information:   [address] [place] [address] [phone]        The results of significant diagnostics from this hospitalization (including imaging, microbiology, ancillary and laboratory) are listed below for reference.    Significant Diagnostic Studies: Ct Abdomen Pelvis W Contrast  06/09/2015   CLINICAL DATA:  Acute onset of epigastric abdominal pain and vomiting. Initial encounter.  EXAM: CT ABDOMEN AND PELVIS WITH CONTRAST  TECHNIQUE: Multidetector CT imaging of the abdomen and pelvis was performed using the standard protocol following bolus administration of intravenous contrast.  CONTRAST:  149mL OMNIPAQUE IOHEXOL 300 MG/ML  SOLN  COMPARISON:  None.  FINDINGS: The visualized lung bases are clear.  A few scattered small hypodensities within the liver measure up to 6 mm in size. The liver and spleen are otherwise unremarkable. The gallbladder is within normal limits. The pancreas and adrenal glands are unremarkable.  A few small right renal cysts are noted. The kidneys are otherwise unremarkable. There is no evidence of hydronephrosis. No renal or ureteral stones are seen. No perinephric stranding is appreciated.  No free fluid is identified. The small bowel is unremarkable in appearance. The stomach is within normal limits. No acute vascular abnormalities are seen.  The patient is status post appendectomy. The colon is unremarkable in appearance.  The bladder is mildly distended and grossly remarkable. The prostate is normal in size. No inguinal lymphadenopathy is seen.  No acute osseous abnormalities are identified. The patient is status post lumbar spinal fusion at L1-S1. Underlying degenerative change and decompression are noted.  IMPRESSION: 1. No acute abnormality seen within the abdomen or pelvis. 2. Few small right renal cysts noted. Likely tiny hepatic cysts seen. 3. Mild degenerative change along the lumbar spine, with underlying fusion at L1-S1.    Electronically Signed   By: Garald Balding M.D.   On: 06/09/2015 21:28    Microbiology: No results found for this or any previous visit (from the past 240 hour(s)).   Labs: Basic Metabolic Panel:  Recent Labs Lab 06/09/15 1542 06/10/15 0540 06/12/15 0321  NA 133* 133* 139  K 3.6 3.6 4.4  CL 97* 99* 101  CO2 23 23 31   GLUCOSE 125* 112* 104*  BUN 10 8 11   CREATININE 0.89 0.71 0.79  CALCIUM 9.6 8.6* 8.6*   Liver Function Tests:  Recent Labs Lab 06/09/15 1542 06/10/15 0540  AST 29 27  ALT 20 20  ALKPHOS 69 57  BILITOT 1.1 1.2  PROT 6.9 6.0*  ALBUMIN 4.5 3.8    Recent Labs Lab 06/09/15 1542  LIPASE 25   No results for input(s): AMMONIA in the last 168 hours. CBC:  Recent Labs Lab 06/09/15 1542 06/10/15 0540 06/12/15 0321  WBC 11.3* 11.8* 6.0  HGB 14.7 13.5 12.9*  HCT 41.3 38.3* 37.4*  MCV 82.8 83.6 84.0  PLT 309 250 199  Cardiac Enzymes: No results for input(s): CKTOTAL, CKMB, CKMBINDEX, TROPONINI in the last 168 hours. BNP: BNP (last 3 results) No results for input(s): BNP in the last 8760 hours.  ProBNP (last 3 results) No results for input(s): PROBNP in the last 8760 hours.  CBG: No results for input(s): GLUCAP in the last 168 hours.     SignedDelfina Redwood  [facility] 06/12/2015, 11:55 AM

## 2015-06-15 ENCOUNTER — Telehealth: Payer: Self-pay | Admitting: Family

## 2015-06-15 ENCOUNTER — Other Ambulatory Visit: Payer: Self-pay | Admitting: Family

## 2015-06-15 NOTE — Telephone Encounter (Signed)
Patient has called in requesting refill for butalbital-acetaminophen-caffeine Emelda Brothers, ESGIC) 50-325-40 MG per tablet [[id number] Pharmacy is [facility] on [address]

## 2015-06-15 NOTE — Telephone Encounter (Signed)
Medication refilled

## 2015-06-16 NOTE — Telephone Encounter (Signed)
Informed pt .

## 2015-06-23 ENCOUNTER — Other Ambulatory Visit: Payer: Self-pay

## 2015-06-23 ENCOUNTER — Other Ambulatory Visit: Payer: Self-pay | Admitting: Family

## 2015-06-23 ENCOUNTER — Telehealth: Payer: Self-pay

## 2015-06-23 DIAGNOSIS — R112 Nausea with vomiting, unspecified: Secondary | ICD-10-CM

## 2015-06-23 MED ORDER — ONDANSETRON HCL 4 MG PO TABS: 4.0000 mg | ORAL_TABLET | Freq: Three times a day (TID) | ORAL | Status: DC | PRN

## 2015-06-23 NOTE — Telephone Encounter (Signed)
Medication sent to pharmacy  

## 2015-06-23 NOTE — Telephone Encounter (Signed)
Pt requesting refill of zofran. Please advise

## 2015-06-26 ENCOUNTER — Other Ambulatory Visit: Payer: Self-pay | Admitting: Family

## 2015-06-29 ENCOUNTER — Other Ambulatory Visit: Payer: Self-pay | Admitting: Family

## 2015-06-29 NOTE — Telephone Encounter (Signed)
Last refill 7/25

## 2015-07-06 ENCOUNTER — Encounter: Payer: Self-pay | Admitting: Family

## 2015-07-06 ENCOUNTER — Other Ambulatory Visit: Payer: Medicare Other

## 2015-07-06 ENCOUNTER — Ambulatory Visit (INDEPENDENT_AMBULATORY_CARE_PROVIDER_SITE_OTHER): Payer: Medicare Other | Admitting: Family

## 2015-07-06 VITALS — BP 136/88 | HR 73 | Temp 98.4°F | Resp 18 | Ht 70.0 in | Wt 181.0 lb

## 2015-07-06 DIAGNOSIS — M2550 Pain in unspecified joint: Secondary | ICD-10-CM | POA: Insufficient documentation

## 2015-07-06 DIAGNOSIS — I10 Essential (primary) hypertension: Secondary | ICD-10-CM | POA: Diagnosis not present

## 2015-07-06 DIAGNOSIS — Z79891 Long term (current) use of opiate analgesic: Secondary | ICD-10-CM | POA: Diagnosis not present

## 2015-07-06 DIAGNOSIS — M961 Postlaminectomy syndrome, not elsewhere classified: Secondary | ICD-10-CM | POA: Diagnosis not present

## 2015-07-06 DIAGNOSIS — G43809 Other migraine, not intractable, without status migrainosus: Secondary | ICD-10-CM

## 2015-07-06 DIAGNOSIS — G894 Chronic pain syndrome: Secondary | ICD-10-CM | POA: Diagnosis not present

## 2015-07-06 DIAGNOSIS — F4323 Adjustment disorder with mixed anxiety and depressed mood: Secondary | ICD-10-CM | POA: Diagnosis not present

## 2015-07-06 MED ORDER — BUTALBITAL-APAP-CAFFEINE 50-325-40 MG PO TABS: 1.0000 | ORAL_TABLET | Freq: Two times a day (BID) | ORAL | Status: DC | PRN

## 2015-07-06 NOTE — Assessment & Plan Note (Signed)
Blood pressure is stable and below goal 140/90 both today and at home. Continue current dosage of amlodipine 5 mg once daily. Continue to monitor blood pressure at home. Follow-up in 3 months.

## 2015-07-06 NOTE — Patient Instructions (Signed)
Thank you for choosing [facility].  Summary/Instructions:  Your prescription(s) have been submitted to your pharmacy or been printed and provided for you. Please take as directed and contact our office if you believe you are having problem(s) with the medication(s) or have any questions.  Please stop by the lab on the basement level of the building for your blood work. Your results will be released to MyChart (or called to you) after review, usually within 72 hours after test completion. If any changes need to be made, you will be notified at that same time.  Please stop by radiology on the basement level of the building for your x-rays. Your results will be released to MyChart (or called to you) after review, usually within 72 hours after test completion. If any treatments or changes are necessary, you will be notified at that same time.  Referrals have been made during this visit. You should expect to hear back from our schedulers in about 7-10 days in regards to establishing an appointment with the specialists we discussed.   If your symptoms worsen or fail to improve, please contact our office for further instruction, or in case of emergency go directly to the emergency room at the closest medical facility.     

## 2015-07-06 NOTE — Progress Notes (Signed)
Pre visit review using our clinic review tool, if applicable. No additional management support is needed unless otherwise documented below in the visit note. 

## 2015-07-06 NOTE — Assessment & Plan Note (Signed)
Continues to experience migraine headaches which are moderately controlled Fioricet. Continue current dosage of Fioricet. Refer to neurology for further assessment and recommendation for headache control.

## 2015-07-06 NOTE — Progress Notes (Signed)
Subjective:    Patient ID: Jeremiah Anderson, male    DOB: 18-Jun-1966, 49 y.o.   MRN: [id number]  Chief Complaint  Patient presents with  . Follow-up    has questions about amlodpine, has joint problems, had stomach ulcers    HPI:  Jeremiah Anderson is a 49 y.o. male with a PMH of hypertension, migraines, gastritis, hyperlipidemia, and chronic back pain who presents today for an office follow up.   1.) Blood pressure -Currently maintained on amlodipine 5 mg daily. Takes the medication as prescribed and denies adverse side effects. Takes his blood pressure at home and reports his average blood pressure is below goal of 140/90.  BP Readings from Last 3 Encounters:  07/06/15 136/88  06/12/15 122/72  05/27/15 152/108    2.) Joint problems - Continues to experience joint pain following his rash that he had previously that there was concern for RMSF. Blood work at the time was negative and he was treated with doxycycline. Questions if this is related or possible osteoarthritis.   3.) Headaches - Continues to experience the associated symptom of migraine headaches that is currently treated with Fioricet. Indicates that he is having to take the medication daily. He has a history of multiple medication failures including Imitrex, Topamax, propranolol, and Midrin.   Allergies  Allergen Reactions  . Fentanyl     Hallucinations   . Lisinopril Swelling  . Lyrica [Pregabalin]     hallucinations  . Sulfa Antibiotics Swelling    Current Outpatient Prescriptions on File Prior to Visit  Medication Sig Dispense Refill  . amLODipine (NORVASC) 10 MG tablet Take 0.5 tablets (5 mg total) by mouth daily. (Patient taking differently: Take 10 mg by mouth daily. ) 30 tablet 2  . methocarbamol (ROBAXIN) 750 MG tablet Take 750 mg by mouth 4 (four) times daily.    Marland Kitchen morphine (MSIR) 30 MG tablet Take 30 mg by mouth every 4 (four) hours as needed for severe pain.    Marland Kitchen ondansetron (ZOFRAN) 4 MG tablet Take 1  tablet (4 mg total) by mouth every 8 (eight) hours as needed for nausea or vomiting. 20 tablet 0  . pantoprazole (PROTONIX) 40 MG tablet Take 1 tablet (40 mg total) by mouth 2 (two) times daily. 60 tablet 0  . sucralfate (CARAFATE) 1 G tablet Take 1 tablet (1 g total) by mouth 4 (four) times daily -  with meals and at bedtime. For 3 days 12 tablet 0   No current facility-administered medications on file prior to visit.    Past Medical History  Diagnosis Date  . Arthritis   . Hypertension   . Migraines   . GERD (gastroesophageal reflux disease)   . Hyperlipidemia     Review of Systems  Constitutional: Negative for fever and chills.  Gastrointestinal: Negative for nausea, vomiting, diarrhea and constipation.  Musculoskeletal: Positive for back pain and arthralgias.  Neurological: Positive for headaches. Negative for seizures and weakness.      Objective:    BP 136/88 mmHg  Pulse 73  Temp(Src) 98.4 F (36.9 C) (Oral)  Resp 18  Ht 5\' 10"  (1.778 m)  Wt 181 lb (82.101 kg)  BMI 25.97 kg/m2  SpO2 97% Nursing note and vital signs reviewed.  Physical Exam  Constitutional: He is oriented to person, place, and time. He appears well-developed and well-nourished. No distress.  Cardiovascular: Normal rate, regular rhythm, normal heart sounds and intact distal pulses.   Pulmonary/Chest: Effort normal and breath sounds normal.  Musculoskeletal:  Left wrist - no obvious pulmonary discoloration noted. Mild edema located on the lateral aspect of the left wrist with no tenderness. Range of motion is complete and active and passive range of motion. Strength is normal no increase in discomfort. Pulses are intact and appropriate.  Neurological: He is alert and oriented to person, place, and time.  Skin: Skin is warm and dry.  Psychiatric: He has a normal mood and affect. His behavior is normal. Judgment and thought content normal.       Assessment & Plan:   Problem List Items Addressed This  Visit      Cardiovascular and Mediastinum   Essential hypertension    Blood pressure is stable and below goal 140/90 both today and at home. Continue current dosage of amlodipine 5 mg once daily. Continue to monitor blood pressure at home. Follow-up in 3 months.      Migraines - Primary    Continues to experience migraine headaches which are moderately controlled Fioricet. Continue current dosage of Fioricet. Refer to neurology for further assessment and recommendation for headache control.      Relevant Medications   butalbital-acetaminophen-caffeine (FIORICET, ESGIC) 50-325-40 MG per tablet   Other Relevant Orders   Ambulatory referral to Neurology     Other   Arthralgia    Arthralgias of undetermined cause, however cannot rule out chronic pain or osteoarthritis. Obtain [facility] spotted fever antibodies and Lyme disease antibodies to rule out underlying causes despite completion of doxycycline. Treat conservatively at this time with heat, stretching, and exercise. Follow up pending blood work.      Relevant Orders   [facility] spotted fvr ab, IgG-blood   [facility]. [facility] Antibodies

## 2015-07-06 NOTE — Assessment & Plan Note (Signed)
Arthralgias of undetermined cause, however cannot rule out chronic pain or osteoarthritis. Obtain [facility] spotted fever antibodies and Lyme disease antibodies to rule out underlying causes despite completion of doxycycline. Treat conservatively at this time with heat, stretching, and exercise. Follow up pending blood work.

## 2015-07-07 LAB — B. BURGDORFI ANTIBODIES: B BURGDORFERI AB IGG+ IGM: 0.15 {ISR}

## 2015-07-09 ENCOUNTER — Telehealth: Payer: Self-pay | Admitting: Family

## 2015-07-09 LAB — ROCKY MTN SPOTTED FVR AB, IGG-BLOOD: RMSF IGG: 0.42 IV

## 2015-07-09 NOTE — Telephone Encounter (Signed)
Please inform patient that his [facility] Spotted Fever and Lyme Disease tests are negative.

## 2015-07-10 NOTE — Telephone Encounter (Signed)
LVM stating test were negative, if any questions call back

## 2015-07-17 ENCOUNTER — Other Ambulatory Visit: Payer: Self-pay | Admitting: Family

## 2015-07-22 ENCOUNTER — Ambulatory Visit: Payer: Medicare Other | Admitting: Family

## 2015-07-30 DIAGNOSIS — M5412 Radiculopathy, cervical region: Secondary | ICD-10-CM | POA: Diagnosis not present

## 2015-07-30 DIAGNOSIS — Z79891 Long term (current) use of opiate analgesic: Secondary | ICD-10-CM | POA: Diagnosis not present

## 2015-07-30 DIAGNOSIS — M961 Postlaminectomy syndrome, not elsewhere classified: Secondary | ICD-10-CM | POA: Diagnosis not present

## 2015-07-30 DIAGNOSIS — G894 Chronic pain syndrome: Secondary | ICD-10-CM | POA: Diagnosis not present

## 2015-07-31 ENCOUNTER — Other Ambulatory Visit: Payer: Self-pay | Admitting: Family

## 2015-08-04 ENCOUNTER — Other Ambulatory Visit: Payer: Self-pay | Admitting: Anesthesiology

## 2015-08-04 DIAGNOSIS — M542 Cervicalgia: Secondary | ICD-10-CM

## 2015-08-07 ENCOUNTER — Other Ambulatory Visit: Payer: Self-pay

## 2015-08-07 ENCOUNTER — Other Ambulatory Visit: Payer: Self-pay | Admitting: Family

## 2015-08-07 ENCOUNTER — Ambulatory Visit
Admission: RE | Admit: 2015-08-07 | Discharge: 2015-08-07 | Disposition: A | Discharge status: Home / Self Care | Payer: Medicare Other | Source: Ambulatory Visit | Attending: Anesthesiology | Admitting: Anesthesiology

## 2015-08-07 DIAGNOSIS — M47812 Spondylosis without myelopathy or radiculopathy, cervical region: Secondary | ICD-10-CM | POA: Diagnosis not present

## 2015-08-07 DIAGNOSIS — M5022 Other cervical disc displacement, mid-cervical region: Secondary | ICD-10-CM | POA: Diagnosis not present

## 2015-08-07 DIAGNOSIS — M542 Cervicalgia: Secondary | ICD-10-CM

## 2015-08-07 MED ORDER — SILDENAFIL CITRATE 50 MG PO TABS: 50.0000 mg | ORAL_TABLET | Freq: Every day | ORAL | Status: DC | PRN

## 2015-08-10 DIAGNOSIS — G894 Chronic pain syndrome: Secondary | ICD-10-CM | POA: Diagnosis not present

## 2015-08-10 DIAGNOSIS — Z79891 Long term (current) use of opiate analgesic: Secondary | ICD-10-CM | POA: Diagnosis not present

## 2015-08-10 DIAGNOSIS — M961 Postlaminectomy syndrome, not elsewhere classified: Secondary | ICD-10-CM | POA: Diagnosis not present

## 2015-08-10 DIAGNOSIS — M5412 Radiculopathy, cervical region: Secondary | ICD-10-CM | POA: Diagnosis not present

## 2015-08-11 ENCOUNTER — Ambulatory Visit: Payer: Medicare Other | Admitting: Neurology

## 2015-08-12 ENCOUNTER — Encounter: Payer: Self-pay | Admitting: Family

## 2015-08-12 ENCOUNTER — Ambulatory Visit (INDEPENDENT_AMBULATORY_CARE_PROVIDER_SITE_OTHER): Payer: Medicare Other | Admitting: Family

## 2015-08-12 VITALS — BP 144/90 | HR 94 | Temp 98.4°F | Resp 18 | Ht 70.0 in | Wt 188.0 lb

## 2015-08-12 DIAGNOSIS — J321 Chronic frontal sinusitis: Secondary | ICD-10-CM

## 2015-08-12 DIAGNOSIS — J329 Chronic sinusitis, unspecified: Secondary | ICD-10-CM | POA: Insufficient documentation

## 2015-08-12 MED ORDER — PREDNISONE 10 MG PO TABS: ORAL_TABLET | ORAL | Status: DC

## 2015-08-12 MED ORDER — AMOXICILLIN-POT CLAVULANATE 875-125 MG PO TABS: 1.0000 | ORAL_TABLET | Freq: Two times a day (BID) | ORAL | Status: DC

## 2015-08-12 MED ORDER — FLUTICASONE PROPIONATE 50 MCG/ACT NA SUSP: 2.0000 | Freq: Every day | NASAL | Status: DC

## 2015-08-12 NOTE — Patient Instructions (Signed)
Thank you for choosing [facility].  Summary/Instructions:  Your prescription(s) have been submitted to your pharmacy or been printed and provided for you. Please take as directed and contact our office if you believe you are having problem(s) with the medication(s) or have any questions.  If your symptoms worsen or fail to improve, please contact our office for further instruction, or in case of emergency go directly to the emergency room at the closest medical facility.   Sinusitis Sinusitis is redness, soreness, and inflammation of the paranasal sinuses. Paranasal sinuses are air pockets within the bones of your face (beneath the eyes, the middle of the forehead, or above the eyes). In healthy paranasal sinuses, mucus is able to drain out, and air is able to circulate through them by way of your nose. However, when your paranasal sinuses are inflamed, mucus and air can become trapped. This can allow bacteria and other germs to grow and cause infection. Sinusitis can develop quickly and last only a short time (acute) or continue over a long period (chronic). Sinusitis that lasts for more than 12 weeks is considered chronic.  CAUSES  Causes of sinusitis include:  Allergies.  Structural abnormalities, such as displacement of the cartilage that separates your nostrils (deviated septum), which can decrease the air flow through your nose and sinuses and affect sinus drainage.  Functional abnormalities, such as when the small hairs (cilia) that line your sinuses and help remove mucus do not work properly or are not present. SIGNS AND SYMPTOMS  Symptoms of acute and chronic sinusitis are the same. The primary symptoms are pain and pressure around the affected sinuses. Other symptoms include:  Upper toothache.  Earache.  Headache.  Bad breath.  Decreased sense of smell and taste.  A cough, which worsens when you are lying flat.  Fatigue.  Fever.  Thick drainage from your nose,  which often is green and may contain pus (purulent).  Swelling and warmth over the affected sinuses. DIAGNOSIS  Your health care provider will perform a physical exam. During the exam, your health care provider may:  Look in your nose for signs of abnormal growths in your nostrils (nasal polyps).  Tap over the affected sinus to check for signs of infection.  View the inside of your sinuses (endoscopy) using an imaging device that has a light attached (endoscope). If your health care provider suspects that you have chronic sinusitis, one or more of the following tests may be recommended:  Allergy tests.  Nasal culture. A sample of mucus is taken from your nose, sent to a lab, and screened for bacteria.  Nasal cytology. A sample of mucus is taken from your nose and examined by your health care provider to determine if your sinusitis is related to an allergy. TREATMENT  Most cases of acute sinusitis are related to a viral infection and will resolve on their own within 10 days. Sometimes medicines are prescribed to help relieve symptoms (pain medicine, decongestants, nasal steroid sprays, or saline sprays).  However, for sinusitis related to a bacterial infection, your health care provider will prescribe antibiotic medicines. These are medicines that will help kill the bacteria causing the infection.  Rarely, sinusitis is caused by a fungal infection. In theses cases, your health care provider will prescribe antifungal medicine. For some cases of chronic sinusitis, surgery is needed. Generally, these are cases in which sinusitis recurs more than 3 times per year, despite other treatments. HOME CARE INSTRUCTIONS   Drink plenty of water. Water helps   thin the mucus so your sinuses can drain more easily.  Use a humidifier.  Inhale steam 3 to 4 times a day (for example, sit in the bathroom with the shower running).  Apply a warm, moist washcloth to your face 3 to 4 times a day, or as directed  by your health care provider.  Use saline nasal sprays to help moisten and clean your sinuses.  Take medicines only as directed by your health care provider.  If you were prescribed either an antibiotic or antifungal medicine, finish it all even if you start to feel better. SEEK IMMEDIATE MEDICAL CARE IF:  You have increasing pain or severe headaches.  You have nausea, vomiting, or drowsiness.  You have swelling around your face.  You have vision problems.  You have a stiff neck.  You have difficulty breathing. MAKE SURE YOU:   Understand these instructions.  Will watch your condition.  Will get help right away if you are not doing well or get worse. Document Released: 11/07/2005 Document Revised: 03/24/2014 Document Reviewed: 11/22/2011 Exit[employer] Patient Information 2015 [employer]. This information is not intended to replace advice given to you by your health care provider. Make sure you discuss any questions you have with your health care provider.   

## 2015-08-12 NOTE — Progress Notes (Signed)
Pre visit review using our clinic review tool, if applicable. No additional management support is needed unless otherwise documented below in the visit note. 

## 2015-08-12 NOTE — Progress Notes (Signed)
Subjective:    Patient ID: Jeremiah Anderson, male    DOB: 19-Apr-1966, 49 y.o.   MRN: [id number]  Chief Complaint  Patient presents with  . Headache    went to have a MRI done of his head bc of the headaches, was told he has chronic sinusitis and he would like to know where to go next    HPI:  Jeremiah Anderson is a 49 y.o. male who  has a past medical history of Arthritis; Hypertension; Migraines; GERD (gastroesophageal reflux disease); and Hyperlipidemia. and presents today for an office follow up.   1.) Headaches - Continues to experience the associated symptom of headaches and is maintained on fioricet. MRI completed shows evidence of cervical spondylosis and degenerative disc disease and impingment at C5-C6 and C3-C4, C6-C7 along with mild chronic bilateral ethmoid sinusitis. Denies any feelings of illness or symptoms of sinus infection but does express some occasional purulent nasal discharge. Also his right ear does have some drainge on occasion. Denies any additional modifying factors that improve the sinus symptoms when he has them.   Allergies  Allergen Reactions  . Fentanyl     Hallucinations   . Lisinopril Swelling  . Lyrica [Pregabalin]     hallucinations  . Sulfa Antibiotics Swelling     Current Outpatient Prescriptions on File Prior to Visit  Medication Sig Dispense Refill  . amLODipine (NORVASC) 10 MG tablet Take 0.5 tablets (5 mg total) by mouth daily. (Patient taking differently: Take 10 mg by mouth daily. ) 30 tablet 2  . butalbital-acetaminophen-caffeine (FIORICET, ESGIC) 50-325-40 MG per tablet TAKE ONE TABLET BY MOUTH TWICE DAILY AS NEEDED FOR HEADACHE 60 tablet 0  . methocarbamol (ROBAXIN) 750 MG tablet Take 750 mg by mouth 4 (four) times daily.    Marland Kitchen morphine (MSIR) 30 MG tablet Take 30 mg by mouth every 4 (four) hours as needed for severe pain.    Marland Kitchen ondansetron (ZOFRAN) 4 MG tablet TAKE ONE TABLET BY MOUTH EVERY 8 HOURS AS NEEDED FOR NAUSEA OR VOMITING 20 tablet 0    . sildenafil (VIAGRA) 50 MG tablet Take 1 tablet (50 mg total) by mouth daily as needed for erectile dysfunction. 10 tablet 0   No current facility-administered medications on file prior to visit.     Past Surgical History  Procedure Laterality Date  . Appendectomy  1980s    post op bleeding sent him back for ex lap and hemostatic intervention.   . Back surgery      8 surgeries total - fused from L1-S1  . 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   . Knee surgery      Multiple bilateral scopes  . Ex lap with peri gastric vessel ligation  1990s    after retching, he rupture a blood vessel "outside" the stomach.  vessel was ligated per pt's descriiption.   . Esophagogastroduodenoscopy N/A 06/10/2015    Procedure: ESOPHAGOGASTRODUODENOSCOPY (EGD);  Surgeon: Lafayette Dragon, MD;  Location: [facility] ENDOSCOPY;  Service: Endoscopy;  Laterality: N/A;     Review of Systems  Constitutional: Negative for fever and chills.  HENT: Positive for sinus pressure. Negative for congestion and ear pain.   Respiratory: Negative for cough.   Neurological: Positive for headaches.      Objective:    BP 144/90 mmHg  Pulse 94  Temp(Src) 98.4 F (36.9 C) (Oral)  Resp 18  Ht 5\' 10"  (1.778 m)  Wt 188 lb (85.276  kg)  BMI 26.98 kg/m2  SpO2 97% Nursing note and vital signs reviewed.  Physical Exam  Constitutional: He is oriented to person, place, and time. He appears well-developed and well-nourished. No distress.  HENT:  Right Ear: Hearing, tympanic membrane, external ear and ear canal normal.  Left Ear: Hearing, tympanic membrane, external ear and ear canal normal.  Nose: Right sinus exhibits frontal sinus tenderness. Right sinus exhibits no maxillary sinus tenderness. Left sinus exhibits no maxillary sinus tenderness and no frontal sinus tenderness.  Mouth/Throat: Uvula is midline, oropharynx is clear and moist and mucous membranes are normal.  Cardiovascular: Normal rate,  regular rhythm, normal heart sounds and intact distal pulses.   Pulmonary/Chest: Effort normal and breath sounds normal.  Neurological: He is alert and oriented to person, place, and time.  Skin: Skin is warm and dry.  Psychiatric: He has a normal mood and affect. His behavior is normal. Judgment and thought content normal.       Assessment & Plan:   Problem List Items Addressed This Visit      Respiratory   Sinusitis, chronic - Primary    Chronic sinusitis noted on recent MRI with mild waxing and weaning symptoms. Stat Augmentin and prednisone taper. Start Flonase and over the counter medication as needed for symptom relief and supportive care. Follow up in 10 days if not improvement will refer to ENT.       Relevant Medications   amoxicillin-clavulanate (AUGMENTIN) 875-125 MG per tablet   predniSONE (DELTASONE) 10 MG tablet   fluticasone (FLONASE) 50 MCG/ACT nasal spray

## 2015-08-12 NOTE — Assessment & Plan Note (Addendum)
Chronic sinusitis noted on recent MRI with mild waxing and weaning symptoms. Stat Augmentin and prednisone taper. Start Flonase and over the counter medication as needed for symptom relief and supportive care. Follow up in 10 days if not improvement will refer to ENT.

## 2015-08-13 DIAGNOSIS — M542 Cervicalgia: Secondary | ICD-10-CM | POA: Diagnosis not present

## 2015-08-17 DIAGNOSIS — M542 Cervicalgia: Secondary | ICD-10-CM | POA: Diagnosis not present

## 2015-08-19 DIAGNOSIS — M542 Cervicalgia: Secondary | ICD-10-CM | POA: Diagnosis not present

## 2015-08-24 DIAGNOSIS — M542 Cervicalgia: Secondary | ICD-10-CM | POA: Diagnosis not present

## 2015-08-26 ENCOUNTER — Other Ambulatory Visit: Payer: Self-pay | Admitting: Family

## 2015-08-26 DIAGNOSIS — M542 Cervicalgia: Secondary | ICD-10-CM | POA: Diagnosis not present

## 2015-08-26 NOTE — Telephone Encounter (Signed)
Last refill was 9/9. Please advise.

## 2015-08-27 DIAGNOSIS — M961 Postlaminectomy syndrome, not elsewhere classified: Secondary | ICD-10-CM | POA: Diagnosis not present

## 2015-08-27 DIAGNOSIS — M5412 Radiculopathy, cervical region: Secondary | ICD-10-CM | POA: Diagnosis not present

## 2015-08-27 DIAGNOSIS — Z79891 Long term (current) use of opiate analgesic: Secondary | ICD-10-CM | POA: Diagnosis not present

## 2015-08-27 DIAGNOSIS — G894 Chronic pain syndrome: Secondary | ICD-10-CM | POA: Diagnosis not present

## 2015-08-27 NOTE — Telephone Encounter (Signed)
OK X1 

## 2015-08-27 NOTE — Telephone Encounter (Signed)
Notified pt refill has been called into pharmacy...Jeremiah Anderson

## 2015-08-27 NOTE — Telephone Encounter (Signed)
Pt call requesting status on refilling his fioricet. Is this ok to refill...Jeremiah Anderson

## 2015-08-31 DIAGNOSIS — M542 Cervicalgia: Secondary | ICD-10-CM | POA: Diagnosis not present

## 2015-09-02 DIAGNOSIS — M542 Cervicalgia: Secondary | ICD-10-CM | POA: Diagnosis not present

## 2015-09-07 ENCOUNTER — Other Ambulatory Visit: Payer: Self-pay | Admitting: Family

## 2015-09-08 ENCOUNTER — Other Ambulatory Visit: Payer: Self-pay | Admitting: Family

## 2015-09-08 NOTE — Telephone Encounter (Signed)
Last refill of fioricet was 10/6

## 2015-09-09 NOTE — Telephone Encounter (Signed)
This prescription needs to last the rest of the month. There will be no refills prior to then.

## 2015-09-09 NOTE — Telephone Encounter (Signed)
Last refill of fioricet was 10/6

## 2015-09-14 DIAGNOSIS — M542 Cervicalgia: Secondary | ICD-10-CM | POA: Diagnosis not present

## 2015-09-15 DIAGNOSIS — J322 Chronic ethmoidal sinusitis: Secondary | ICD-10-CM | POA: Diagnosis not present

## 2015-09-15 DIAGNOSIS — J342 Deviated nasal septum: Secondary | ICD-10-CM | POA: Diagnosis not present

## 2015-09-15 DIAGNOSIS — J310 Chronic rhinitis: Secondary | ICD-10-CM | POA: Diagnosis not present

## 2015-09-18 ENCOUNTER — Other Ambulatory Visit (INDEPENDENT_AMBULATORY_CARE_PROVIDER_SITE_OTHER): Payer: Self-pay | Admitting: Otolaryngology

## 2015-09-18 DIAGNOSIS — J329 Chronic sinusitis, unspecified: Secondary | ICD-10-CM

## 2015-09-24 DIAGNOSIS — R51 Headache: Secondary | ICD-10-CM | POA: Diagnosis not present

## 2015-09-24 DIAGNOSIS — Z79891 Long term (current) use of opiate analgesic: Secondary | ICD-10-CM | POA: Diagnosis not present

## 2015-09-24 DIAGNOSIS — M961 Postlaminectomy syndrome, not elsewhere classified: Secondary | ICD-10-CM | POA: Diagnosis not present

## 2015-09-24 DIAGNOSIS — M791 Myalgia: Secondary | ICD-10-CM | POA: Diagnosis not present

## 2015-09-24 DIAGNOSIS — G894 Chronic pain syndrome: Secondary | ICD-10-CM | POA: Diagnosis not present

## 2015-09-30 ENCOUNTER — Ambulatory Visit
Admission: RE | Admit: 2015-09-30 | Discharge: 2015-09-30 | Disposition: A | Discharge status: Home / Self Care | Payer: Medicare Other | Source: Ambulatory Visit | Attending: Otolaryngology | Admitting: Otolaryngology

## 2015-09-30 ENCOUNTER — Telehealth: Payer: Self-pay | Admitting: Family

## 2015-09-30 DIAGNOSIS — J329 Chronic sinusitis, unspecified: Secondary | ICD-10-CM

## 2015-09-30 DIAGNOSIS — R51 Headache: Secondary | ICD-10-CM | POA: Diagnosis not present

## 2015-09-30 NOTE — Telephone Encounter (Signed)
Pt called in and said that he needs refill on his butalbital-acetaminophen-caffeine [employer], ESGIC) 50-325-40 MG tablet [[id number]] He lost his bottle of this med    Wants it called in to [facility] on [address]

## 2015-09-30 NOTE — Telephone Encounter (Signed)
Last refill was 10/19

## 2015-10-01 DIAGNOSIS — M791 Myalgia: Secondary | ICD-10-CM | POA: Diagnosis not present

## 2015-10-01 DIAGNOSIS — M961 Postlaminectomy syndrome, not elsewhere classified: Secondary | ICD-10-CM | POA: Diagnosis not present

## 2015-10-01 DIAGNOSIS — M5412 Radiculopathy, cervical region: Secondary | ICD-10-CM | POA: Diagnosis not present

## 2015-10-01 DIAGNOSIS — G894 Chronic pain syndrome: Secondary | ICD-10-CM | POA: Diagnosis not present

## 2015-10-01 DIAGNOSIS — R51 Headache: Secondary | ICD-10-CM | POA: Diagnosis not present

## 2015-10-06 DIAGNOSIS — J342 Deviated nasal septum: Secondary | ICD-10-CM | POA: Diagnosis not present

## 2015-10-06 DIAGNOSIS — R51 Headache: Secondary | ICD-10-CM | POA: Diagnosis not present

## 2015-10-06 DIAGNOSIS — J310 Chronic rhinitis: Secondary | ICD-10-CM | POA: Diagnosis not present

## 2015-10-06 MED ORDER — BUTALBITAL-APAP-CAFFEINE 50-325-40 MG PO TABS: 1.0000 | ORAL_TABLET | Freq: Three times a day (TID) | ORAL | Status: DC | PRN

## 2015-10-06 NOTE — Telephone Encounter (Signed)
Rx faxed

## 2015-10-06 NOTE — Addendum Note (Signed)
Addended by: [name] D on: 10/06/2015 08:03 AM   Modules accepted: Orders

## 2015-10-06 NOTE — Telephone Encounter (Signed)
Medication refilled

## 2015-10-08 DIAGNOSIS — G894 Chronic pain syndrome: Secondary | ICD-10-CM | POA: Diagnosis not present

## 2015-10-08 DIAGNOSIS — Z79891 Long term (current) use of opiate analgesic: Secondary | ICD-10-CM | POA: Diagnosis not present

## 2015-10-08 DIAGNOSIS — M791 Myalgia: Secondary | ICD-10-CM | POA: Diagnosis not present

## 2015-10-08 DIAGNOSIS — M961 Postlaminectomy syndrome, not elsewhere classified: Secondary | ICD-10-CM | POA: Diagnosis not present

## 2015-10-22 DIAGNOSIS — M961 Postlaminectomy syndrome, not elsewhere classified: Secondary | ICD-10-CM | POA: Diagnosis not present

## 2015-10-22 DIAGNOSIS — G894 Chronic pain syndrome: Secondary | ICD-10-CM | POA: Diagnosis not present

## 2015-10-22 DIAGNOSIS — Z79891 Long term (current) use of opiate analgesic: Secondary | ICD-10-CM | POA: Diagnosis not present

## 2015-10-22 DIAGNOSIS — M5412 Radiculopathy, cervical region: Secondary | ICD-10-CM | POA: Diagnosis not present

## 2015-11-02 ENCOUNTER — Other Ambulatory Visit: Payer: Self-pay | Admitting: Family

## 2015-11-02 NOTE — Telephone Encounter (Signed)
Last refill 11/15

## 2015-11-03 DIAGNOSIS — Z79899 Other long term (current) drug therapy: Secondary | ICD-10-CM | POA: Diagnosis not present

## 2015-11-03 DIAGNOSIS — M4722 Other spondylosis with radiculopathy, cervical region: Secondary | ICD-10-CM | POA: Diagnosis not present

## 2015-11-03 DIAGNOSIS — R51 Headache: Secondary | ICD-10-CM | POA: Diagnosis not present

## 2015-11-03 DIAGNOSIS — Z049 Encounter for examination and observation for unspecified reason: Secondary | ICD-10-CM | POA: Diagnosis not present

## 2015-11-03 DIAGNOSIS — G43719 Chronic migraine without aura, intractable, without status migrainosus: Secondary | ICD-10-CM | POA: Diagnosis not present

## 2015-11-04 ENCOUNTER — Other Ambulatory Visit: Payer: Self-pay | Admitting: Family

## 2015-11-09 DIAGNOSIS — G5600 Carpal tunnel syndrome, unspecified upper limb: Secondary | ICD-10-CM | POA: Diagnosis not present

## 2015-11-09 DIAGNOSIS — G629 Polyneuropathy, unspecified: Secondary | ICD-10-CM | POA: Diagnosis not present

## 2015-11-12 DIAGNOSIS — G43719 Chronic migraine without aura, intractable, without status migrainosus: Secondary | ICD-10-CM | POA: Diagnosis not present

## 2015-11-12 DIAGNOSIS — G518 Other disorders of facial nerve: Secondary | ICD-10-CM | POA: Diagnosis not present

## 2015-11-12 DIAGNOSIS — M791 Myalgia: Secondary | ICD-10-CM | POA: Diagnosis not present

## 2015-11-12 DIAGNOSIS — M542 Cervicalgia: Secondary | ICD-10-CM | POA: Diagnosis not present

## 2015-11-12 DIAGNOSIS — R51 Headache: Secondary | ICD-10-CM | POA: Diagnosis not present

## 2015-11-19 DIAGNOSIS — M961 Postlaminectomy syndrome, not elsewhere classified: Secondary | ICD-10-CM | POA: Diagnosis not present

## 2015-11-19 DIAGNOSIS — M5412 Radiculopathy, cervical region: Secondary | ICD-10-CM | POA: Diagnosis not present

## 2015-11-19 DIAGNOSIS — G894 Chronic pain syndrome: Secondary | ICD-10-CM | POA: Diagnosis not present

## 2015-11-19 DIAGNOSIS — Z79891 Long term (current) use of opiate analgesic: Secondary | ICD-10-CM | POA: Diagnosis not present

## 2015-11-20 ENCOUNTER — Other Ambulatory Visit: Payer: Self-pay | Admitting: Family

## 2015-11-26 DIAGNOSIS — G518 Other disorders of facial nerve: Secondary | ICD-10-CM | POA: Diagnosis not present

## 2015-11-26 DIAGNOSIS — M791 Myalgia: Secondary | ICD-10-CM | POA: Diagnosis not present

## 2015-11-26 DIAGNOSIS — R51 Headache: Secondary | ICD-10-CM | POA: Diagnosis not present

## 2015-11-26 DIAGNOSIS — G43719 Chronic migraine without aura, intractable, without status migrainosus: Secondary | ICD-10-CM | POA: Diagnosis not present

## 2015-11-26 DIAGNOSIS — M542 Cervicalgia: Secondary | ICD-10-CM | POA: Diagnosis not present

## 2015-11-27 ENCOUNTER — Other Ambulatory Visit: Payer: Self-pay | Admitting: Family

## 2015-11-27 NOTE — Telephone Encounter (Signed)
Last refill was 12/12

## 2015-12-07 DIAGNOSIS — Z79891 Long term (current) use of opiate analgesic: Secondary | ICD-10-CM | POA: Diagnosis not present

## 2015-12-07 DIAGNOSIS — G894 Chronic pain syndrome: Secondary | ICD-10-CM | POA: Diagnosis not present

## 2015-12-07 DIAGNOSIS — M533 Sacrococcygeal disorders, not elsewhere classified: Secondary | ICD-10-CM | POA: Diagnosis not present

## 2015-12-07 DIAGNOSIS — M961 Postlaminectomy syndrome, not elsewhere classified: Secondary | ICD-10-CM | POA: Diagnosis not present

## 2015-12-08 DIAGNOSIS — G43719 Chronic migraine without aura, intractable, without status migrainosus: Secondary | ICD-10-CM | POA: Diagnosis not present

## 2015-12-08 DIAGNOSIS — M791 Myalgia: Secondary | ICD-10-CM | POA: Diagnosis not present

## 2015-12-08 DIAGNOSIS — R51 Headache: Secondary | ICD-10-CM | POA: Diagnosis not present

## 2015-12-08 DIAGNOSIS — M542 Cervicalgia: Secondary | ICD-10-CM | POA: Diagnosis not present

## 2015-12-08 DIAGNOSIS — G518 Other disorders of facial nerve: Secondary | ICD-10-CM | POA: Diagnosis not present

## 2015-12-17 DIAGNOSIS — M533 Sacrococcygeal disorders, not elsewhere classified: Secondary | ICD-10-CM | POA: Diagnosis not present

## 2015-12-17 DIAGNOSIS — G894 Chronic pain syndrome: Secondary | ICD-10-CM | POA: Diagnosis not present

## 2015-12-17 DIAGNOSIS — M961 Postlaminectomy syndrome, not elsewhere classified: Secondary | ICD-10-CM | POA: Diagnosis not present

## 2015-12-17 DIAGNOSIS — Z79891 Long term (current) use of opiate analgesic: Secondary | ICD-10-CM | POA: Diagnosis not present

## 2015-12-18 ENCOUNTER — Encounter: Payer: Self-pay | Admitting: Family

## 2015-12-18 ENCOUNTER — Ambulatory Visit (INDEPENDENT_AMBULATORY_CARE_PROVIDER_SITE_OTHER): Payer: Medicare Other | Admitting: Family

## 2015-12-18 ENCOUNTER — Telehealth: Payer: Self-pay | Admitting: Family

## 2015-12-18 ENCOUNTER — Ambulatory Visit (INDEPENDENT_AMBULATORY_CARE_PROVIDER_SITE_OTHER)
Admission: RE | Admit: 2015-12-18 | Discharge: 2015-12-18 | Disposition: A | Discharge status: Home / Self Care | Payer: Medicare Other | Source: Ambulatory Visit | Attending: Family | Admitting: Family

## 2015-12-18 VITALS — BP 170/110 | HR 91 | Temp 98.0°F | Resp 16 | Ht 70.0 in | Wt 173.0 lb

## 2015-12-18 DIAGNOSIS — S300XXA Contusion of lower back and pelvis, initial encounter: Secondary | ICD-10-CM

## 2015-12-18 DIAGNOSIS — G43809 Other migraine, not intractable, without status migrainosus: Secondary | ICD-10-CM | POA: Diagnosis not present

## 2015-12-18 DIAGNOSIS — M533 Sacrococcygeal disorders, not elsewhere classified: Secondary | ICD-10-CM | POA: Diagnosis not present

## 2015-12-18 DIAGNOSIS — M545 Low back pain: Secondary | ICD-10-CM | POA: Diagnosis not present

## 2015-12-18 MED ORDER — BUTALBITAL-APAP-CAFFEINE 50-325-40 MG PO TABS: 1.0000 | ORAL_TABLET | Freq: Three times a day (TID) | ORAL | Status: DC | PRN

## 2015-12-18 NOTE — Progress Notes (Signed)
Subjective:    Patient ID: Jeremiah Anderson, male    DOB: 12/16/1965, 50 y.o.   MRN: [id number]  Chief Complaint  Patient presents with  . Fall    lumbar MRI with and without contrast, had a fall a couple of weeks ago and tailbone has been in pain since it has eased up alot but still has pain on and off    HPI:  Jeremiah Anderson is a 50 y.o. male who  has a past medical history of Arthritis; Hypertension; Migraines; GERD (gastroesophageal reflux disease); and Hyperlipidemia. and presents today for an acute office visit.   1.) Tailbone - This is a new problem. Associated symptom of pain located in his tailbone has been going on for a couple of weeks following a fall when was attempting to open a door. Indicates that his pain has improved since the initial incident, but continues to have pain that waxes and wanes. Modifying factors include the previously prescribed pain medication which does seem to help some.   2.) Lumbar spine - Previous history of lumbar back pain with multiple surgeries has been going on for about several years and is maintained on pain medication through pain management. Continues to experience pain in his lumbar spine and believes the pain may have worsened following the fall and also because of previous surgeries. He was seen by his pain management provider who thinks that this lumbar disc may be involved and recommends an MRI.   Allergies  Allergen Reactions  . Fentanyl     Hallucinations   . Lisinopril Swelling  . Lyrica [Pregabalin]     hallucinations  . Sulfa Antibiotics Swelling     Current Outpatient Prescriptions on File Prior to Visit  Medication Sig Dispense Refill  . amLODipine (NORVASC) 10 MG tablet TAKE ONE-HALF TABLET BY MOUTH ONCE DAILY 30 tablet 0  . fluticasone (FLONASE) 50 MCG/ACT nasal spray Place 2 sprays into both nostrils daily. 16 g 6  . methocarbamol (ROBAXIN) 750 MG tablet Take 750 mg by mouth 4 (four) times daily.    Marland Kitchen morphine (MSIR) 30  MG tablet Take 30 mg by mouth every 3 (three) hours.     . ondansetron (ZOFRAN) 4 MG tablet TAKE ONE TABLET BY MOUTH EVERY 8 HOURS AS NEEDED FOR NAUSEA OR VOMITING 20 tablet 0  . sildenafil (VIAGRA) 50 MG tablet Take 1 tablet (50 mg total) by mouth daily as needed for erectile dysfunction. 10 tablet 0   No current facility-administered medications on file prior to visit.     Past Surgical History  Procedure Laterality Date  . Appendectomy  1980s    post op bleeding sent him back for ex lap and hemostatic intervention.   . Back surgery      8 surgeries total - fused from L1-S1  . 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   . Knee surgery      Multiple bilateral scopes  . Ex lap with peri gastric vessel ligation  1990s    after retching, he rupture a blood vessel "outside" the stomach.  vessel was ligated per pt's descriiption.   . Esophagogastroduodenoscopy N/A 06/10/2015    Procedure: ESOPHAGOGASTRODUODENOSCOPY (EGD);  Surgeon: Lafayette Dragon, MD;  Location: [facility] ENDOSCOPY;  Service: Endoscopy;  Laterality: N/A;    Past Medical History  Diagnosis Date  . Arthritis   . Hypertension   . Migraines   . GERD (gastroesophageal reflux disease)   .  Hyperlipidemia      Review of Systems  Genitourinary: Negative for decreased urine volume and difficulty urinating.  Musculoskeletal:       Positive for pain in his sacrum.  Neurological: Negative for weakness and numbness.      Objective:    BP 170/110 mmHg  Pulse 91  Temp(Src) 98 F (36.7 C) (Oral)  Resp 16  Ht 5\' 10"  (1.778 m)  Wt 173 lb (78.472 kg)  BMI 24.82 kg/m2  SpO2 97% Nursing note and vital signs reviewed.   Physical Exam  Constitutional: He is oriented to person, place, and time. He appears well-developed and well-nourished. No distress.  Cardiovascular: Normal rate, regular rhythm, normal heart sounds and intact distal pulses.   Pulmonary/Chest: Effort normal and breath sounds normal.    Musculoskeletal:  Low back - no obvious deformity, discoloration, or edema. Well-healed vertical incisions noted throughout lumbar spine. Mild tenderness elicited on lumbar paraspinal musculature and lower thoracic and upper lumbar junction. Sacrum is also tender to palpation. Range of motion is normal in all directions with some discomfort noted in all directions. Distal pulses, sensation, and reflexes are intact and appropriate. Straight leg raise is negative. Faber's is negative.  Neurological: He is alert and oriented to person, place, and time.  Skin: Skin is warm and dry.  Psychiatric: He has a normal mood and affect. His behavior is normal. Judgment and thought content normal.       Assessment & Plan:   Problem List Items Addressed This Visit      Cardiovascular and Mediastinum   Migraines   Relevant Medications   butalbital-acetaminophen-caffeine (FIORICET, ESGIC) 50-325-40 MG tablet     Other   Low back pain    Worsening low back pain with concern for new disc pathology secondary to prior fusions. Obtain MRI for disc pathology. Continue previously prescribed pain medications per pain management. Continue home exercise therapy. Follow-up pending x-ray results.      Relevant Medications   butalbital-acetaminophen-caffeine (FIORICET, ESGIC) 50-325-40 MG tablet   Other Relevant Orders   MR Lumbar Spine Wo Contrast   Sacral contusion - Primary    Symptoms and exam consistent with sacral contusion with potential concern for fracture. Obtain sacral/coccyx x-ray. Continue pain management with previously prescribed medications. This will most likely heal on its own patient informed that it will take time. Follow-up pending x-ray results or if symptoms worsen or fail to improve.      Relevant Orders   DG Sacrum/Coccyx

## 2015-12-18 NOTE — Patient Instructions (Addendum)
Thank you for choosing [facility].  Summary/Instructions:  Your prescription(s) have been submitted to your pharmacy or been printed and provided for you. Please take as directed and contact our office if you believe you are having problem(s) with the medication(s) or have any questions.  Please stop by radiology on the basement level of the building for your x-rays. Your results will be released to [place] (or called to you) after review, usually within 72 hours after test completion. If any treatments or changes are necessary, you will be notified at that same time.  If your symptoms worsen or fail to improve, please contact our office for further instruction, or in case of emergency go directly to the emergency room at the closest medical facility.   Tailbone Injury The tailbone (coccyx) is the small bone at the lower end of the spine. A tailbone injury may involve stretched ligaments, bruising, or a broken bone (fracture). Tailbone injuries can be painful, and some may take a long time to heal. CAUSES This condition is often caused by falling and landing on the tailbone. Other causes include:  Repeated strain or friction from actions such as rowing and bicycling.  Childbirth. In some cases, the cause may not be known. RISK FACTORS This condition is more common in women than in men. SYMPTOMS Symptoms of this condition include:  Pain in the lower back, especially when sitting.  Pain or difficulty when standing up from a sitting position.  Bruising in the tailbone area.  Painful bowel movements.  In women, pain during intercourse. DIAGNOSIS This condition may be diagnosed based on your symptoms and a physical exam. X-rays may be taken if a fracture is suspected. You may also have other tests, such as a CT scan or MRI. TREATMENT This condition may be treated with medicines to help relieve your pain. Most tailbone injuries heal on their own in 4-6 weeks. However, recovery  time may be longer if the injury involves a fracture. HOME CARE INSTRUCTIONS  Take medicines only as directed by your health care provider.  If directed, apply ice to the injured area:  Put ice in a plastic bag.  Place a towel between your skin and the bag.  Leave the ice on for 20 minutes, 2-3 times per day for the first 1-2 days.  Sit on a large, rubber or inflated ring or cushion to ease your pain. Lean forward when you are sitting to help decrease discomfort.  Avoid sitting for long periods of time.  Increase your activity as the pain allows. Perform any exercises that are recommended by your health care provider or physical therapist.  If you have pain during bowel movements, use stool softeners as directed by your health care provider.  Eat a diet that includes plenty of fiber to help prevent constipation.  Keep all follow-up visits as directed by your health care provider. This is important. PREVENTION Wear appropriate padding and sports gear when bicycling and rowing. This can help to prevent developing an injury that is caused by repeated strain or friction. SEEK MEDICAL CARE IF:  Your pain becomes worse.  Your bowel movements cause a great deal of discomfort.  You are unable to have a bowel movement.  You have uncontrolled urine loss (urinary incontinence).  You have a fever.   This information is not intended to replace advice given to you by your health care provider. Make sure you discuss any questions you have with your health care provider.   Document Released: 11/04/2000  Document Revised: 03/24/2015 Document Reviewed: 11/03/2014 Elsevier Interactive Patient Education [employer].

## 2015-12-18 NOTE — Assessment & Plan Note (Signed)
Symptoms and exam consistent with sacral contusion with potential concern for fracture. Obtain sacral/coccyx x-ray. Continue pain management with previously prescribed medications. This will most likely heal on its own patient informed that it will take time. Follow-up pending x-ray results or if symptoms worsen or fail to improve.

## 2015-12-18 NOTE — Telephone Encounter (Signed)
Please inform patient that his x-rays were negative for fracture. Therefore this is likely a contusion as discussed.

## 2015-12-18 NOTE — Progress Notes (Signed)
Pre visit review using our clinic review tool, if applicable. No additional management support is needed unless otherwise documented below in the visit note. 

## 2015-12-18 NOTE — Assessment & Plan Note (Signed)
Worsening low back pain with concern for new disc pathology secondary to prior fusions. Obtain MRI for disc pathology. Continue previously prescribed pain medications per pain management. Continue home exercise therapy. Follow-up pending x-ray results.

## 2015-12-21 ENCOUNTER — Other Ambulatory Visit: Payer: Self-pay | Admitting: Family

## 2015-12-22 NOTE — Telephone Encounter (Signed)
Please advise, thanks.

## 2015-12-22 NOTE — Telephone Encounter (Signed)
Pt aware of results 

## 2015-12-23 ENCOUNTER — Telehealth: Payer: Self-pay | Admitting: Family Medicine

## 2015-12-23 NOTE — Telephone Encounter (Signed)
MRI was ordered on 1/27. I do not have any results if it was completed.

## 2015-12-23 NOTE — Telephone Encounter (Signed)
Patient is requesting a call back.  States he can get in with a surgeon soon but needs recent MRI.

## 2015-12-24 ENCOUNTER — Ambulatory Visit
Admission: RE | Admit: 2015-12-24 | Discharge: 2015-12-24 | Disposition: A | Discharge status: Home / Self Care | Payer: Medicare Other | Source: Ambulatory Visit | Attending: Family | Admitting: Family

## 2015-12-24 DIAGNOSIS — M5126 Other intervertebral disc displacement, lumbar region: Secondary | ICD-10-CM | POA: Diagnosis not present

## 2015-12-24 DIAGNOSIS — M545 Low back pain: Secondary | ICD-10-CM

## 2015-12-24 NOTE — Telephone Encounter (Signed)
How can we get the MRI?  Does patient need to call to get this?

## 2015-12-24 NOTE — Telephone Encounter (Signed)
MRI scheduled for today.

## 2015-12-25 ENCOUNTER — Telehealth: Payer: Self-pay | Admitting: Family

## 2015-12-25 DIAGNOSIS — M545 Low back pain: Secondary | ICD-10-CM

## 2015-12-25 NOTE — Telephone Encounter (Signed)
Patient called to advise that he is concerned about the results of his MRI being skewed. He mentioned that there is a concern about the MRI w/o contrast not working because of the metal in his back. Please give the patient a call.   i had a lot of trouble following him over the phone.Jeremiah KitchenMarland Anderson

## 2015-12-29 NOTE — Telephone Encounter (Signed)
I am note sure what exactly could be skewed or what he is looking for.

## 2015-12-29 NOTE — Telephone Encounter (Signed)
Please advise on what to tell pt. 

## 2015-12-30 NOTE — Telephone Encounter (Signed)
Please advise 

## 2015-12-30 NOTE — Telephone Encounter (Signed)
Pt states that he was supposed to have an MRI with and without contrast for his back and neck. It was only ordered without contrast. Was concerned that something is going to missed bc that is what the neuro surgeon wanted.

## 2015-12-30 NOTE — Telephone Encounter (Signed)
patient called back to advise that the neurosurgeon needs the MRI WITH contrast. W/o contrast is not needed because its been done at this point.

## 2015-12-31 NOTE — Telephone Encounter (Signed)
The only thing that I can do is put in an order for an MRI with contrast and apologize.

## 2016-01-04 DIAGNOSIS — M4037 Flatback syndrome, lumbosacral region: Secondary | ICD-10-CM | POA: Diagnosis not present

## 2016-01-04 DIAGNOSIS — M961 Postlaminectomy syndrome, not elsewhere classified: Secondary | ICD-10-CM | POA: Diagnosis not present

## 2016-01-04 DIAGNOSIS — M5481 Occipital neuralgia: Secondary | ICD-10-CM | POA: Diagnosis not present

## 2016-01-04 DIAGNOSIS — M4720 Other spondylosis with radiculopathy, site unspecified: Secondary | ICD-10-CM | POA: Diagnosis not present

## 2016-01-04 NOTE — Telephone Encounter (Signed)
Pt to be contacted directly by [facility] with appt info

## 2016-01-05 DIAGNOSIS — G43719 Chronic migraine without aura, intractable, without status migrainosus: Secondary | ICD-10-CM | POA: Diagnosis not present

## 2016-01-05 DIAGNOSIS — M791 Myalgia: Secondary | ICD-10-CM | POA: Diagnosis not present

## 2016-01-05 DIAGNOSIS — M542 Cervicalgia: Secondary | ICD-10-CM | POA: Diagnosis not present

## 2016-01-05 DIAGNOSIS — R51 Headache: Secondary | ICD-10-CM | POA: Diagnosis not present

## 2016-01-05 DIAGNOSIS — G518 Other disorders of facial nerve: Secondary | ICD-10-CM | POA: Diagnosis not present

## 2016-01-06 ENCOUNTER — Other Ambulatory Visit: Payer: Self-pay | Admitting: Family

## 2016-01-06 NOTE — Telephone Encounter (Signed)
Please advise on order being placed.

## 2016-01-06 NOTE — Telephone Encounter (Signed)
Patient states MRI was done without contrast.  Is requesting call back in regards.  Patient would like MRI with contrast.

## 2016-01-07 NOTE — Addendum Note (Signed)
Addended by: [name] D on: 01/07/2016 10:41 PM   Modules accepted: Orders

## 2016-01-07 NOTE — Telephone Encounter (Signed)
Order placed

## 2016-01-08 ENCOUNTER — Other Ambulatory Visit: Payer: Self-pay | Admitting: Family

## 2016-01-08 DIAGNOSIS — M519 Unspecified thoracic, thoracolumbar and lumbosacral intervertebral disc disorder: Secondary | ICD-10-CM

## 2016-01-08 NOTE — Telephone Encounter (Signed)
LVM letting pt know.  

## 2016-01-08 NOTE — Telephone Encounter (Signed)
Can you please call patient. I think there is some kind of confusion regarding the MRI with or without contrast with [facility]

## 2016-01-08 NOTE — Telephone Encounter (Signed)
Returned pt's call.

## 2016-01-11 ENCOUNTER — Ambulatory Visit
Admission: RE | Admit: 2016-01-11 | Discharge: 2016-01-11 | Disposition: A | Discharge status: Home / Self Care | Payer: Medicare Other | Source: Ambulatory Visit | Attending: Family | Admitting: Family

## 2016-01-11 DIAGNOSIS — M545 Low back pain: Secondary | ICD-10-CM | POA: Diagnosis not present

## 2016-01-11 DIAGNOSIS — M519 Unspecified thoracic, thoracolumbar and lumbosacral intervertebral disc disorder: Secondary | ICD-10-CM

## 2016-01-11 MED ORDER — GADOBENATE DIMEGLUMINE 529 MG/ML IV SOLN
15.0000 mL | Freq: Once | INTRAVENOUS | Status: AC | PRN
  Administered 2016-01-11: 15 mL via INTRAVENOUS

## 2016-01-12 ENCOUNTER — Telehealth: Payer: Self-pay | Admitting: Family

## 2016-01-12 NOTE — Telephone Encounter (Signed)
Please inform patient that his MRI shows no significant difference with contrast and the results are available.

## 2016-01-14 DIAGNOSIS — F4323 Adjustment disorder with mixed anxiety and depressed mood: Secondary | ICD-10-CM | POA: Diagnosis not present

## 2016-01-14 DIAGNOSIS — M961 Postlaminectomy syndrome, not elsewhere classified: Secondary | ICD-10-CM | POA: Diagnosis not present

## 2016-01-14 DIAGNOSIS — Z79891 Long term (current) use of opiate analgesic: Secondary | ICD-10-CM | POA: Diagnosis not present

## 2016-01-14 DIAGNOSIS — G894 Chronic pain syndrome: Secondary | ICD-10-CM | POA: Diagnosis not present

## 2016-01-15 NOTE — Telephone Encounter (Signed)
Pt aware.

## 2016-01-18 DIAGNOSIS — M4184 Other forms of scoliosis, thoracic region: Secondary | ICD-10-CM | POA: Diagnosis not present

## 2016-01-18 DIAGNOSIS — M47817 Spondylosis without myelopathy or radiculopathy, lumbosacral region: Secondary | ICD-10-CM | POA: Diagnosis not present

## 2016-01-19 DIAGNOSIS — L700 Acne vulgaris: Secondary | ICD-10-CM | POA: Diagnosis not present

## 2016-01-19 DIAGNOSIS — L570 Actinic keratosis: Secondary | ICD-10-CM | POA: Diagnosis not present

## 2016-01-19 DIAGNOSIS — G894 Chronic pain syndrome: Secondary | ICD-10-CM | POA: Diagnosis not present

## 2016-01-19 DIAGNOSIS — N529 Male erectile dysfunction, unspecified: Secondary | ICD-10-CM | POA: Diagnosis not present

## 2016-01-19 DIAGNOSIS — R51 Headache: Secondary | ICD-10-CM | POA: Diagnosis not present

## 2016-01-19 DIAGNOSIS — M1990 Unspecified osteoarthritis, unspecified site: Secondary | ICD-10-CM | POA: Diagnosis not present

## 2016-01-22 DIAGNOSIS — M4186 Other forms of scoliosis, lumbar region: Secondary | ICD-10-CM | POA: Diagnosis not present

## 2016-01-22 DIAGNOSIS — M4806 Spinal stenosis, lumbar region: Secondary | ICD-10-CM | POA: Diagnosis not present

## 2016-01-22 DIAGNOSIS — M4807 Spinal stenosis, lumbosacral region: Secondary | ICD-10-CM | POA: Diagnosis not present

## 2016-01-22 DIAGNOSIS — Z981 Arthrodesis status: Secondary | ICD-10-CM | POA: Diagnosis not present

## 2016-01-22 DIAGNOSIS — M47817 Spondylosis without myelopathy or radiculopathy, lumbosacral region: Secondary | ICD-10-CM | POA: Diagnosis not present

## 2016-01-25 ENCOUNTER — Telehealth: Payer: Self-pay | Admitting: Family

## 2016-01-25 DIAGNOSIS — G43809 Other migraine, not intractable, without status migrainosus: Secondary | ICD-10-CM

## 2016-01-25 NOTE — Telephone Encounter (Signed)
Patient is in [place] and does not have Jeremiah Anderson near him.  He is requesting that we call in butalbital-acetaminophen-caffeine Jeremiah Anderson, [name]) 50-325-40 MG tablet [id number] to the pharmacy below.   [employer], maintenance (IT) at [facility]: [facility], [address], [place] [address] Hours: Open today  7AM-10PM  See more hours Phone: [phone]

## 2016-01-25 NOTE — Telephone Encounter (Signed)
Last refill was 12/18/15 

## 2016-01-26 MED ORDER — BUTALBITAL-APAP-CAFFEINE 50-325-40 MG PO TABS: 1.0000 | ORAL_TABLET | Freq: Three times a day (TID) | ORAL | Status: DC | PRN

## 2016-01-26 NOTE — Telephone Encounter (Signed)
LVM letting pt know.  

## 2016-01-26 NOTE — Telephone Encounter (Signed)
Please fax to below. Thanks.

## 2016-01-26 NOTE — Telephone Encounter (Signed)
Rx faxed to pharmacy listed below

## 2016-02-03 ENCOUNTER — Other Ambulatory Visit: Payer: Self-pay | Admitting: Family

## 2016-02-04 DIAGNOSIS — G518 Other disorders of facial nerve: Secondary | ICD-10-CM | POA: Diagnosis not present

## 2016-02-04 DIAGNOSIS — M791 Myalgia: Secondary | ICD-10-CM | POA: Diagnosis not present

## 2016-02-04 DIAGNOSIS — M542 Cervicalgia: Secondary | ICD-10-CM | POA: Diagnosis not present

## 2016-02-04 DIAGNOSIS — R51 Headache: Secondary | ICD-10-CM | POA: Diagnosis not present

## 2016-02-04 DIAGNOSIS — G43719 Chronic migraine without aura, intractable, without status migrainosus: Secondary | ICD-10-CM | POA: Diagnosis not present

## 2016-02-05 DIAGNOSIS — F4323 Adjustment disorder with mixed anxiety and depressed mood: Secondary | ICD-10-CM | POA: Diagnosis not present

## 2016-02-05 DIAGNOSIS — M961 Postlaminectomy syndrome, not elsewhere classified: Secondary | ICD-10-CM | POA: Diagnosis not present

## 2016-02-05 DIAGNOSIS — Z79891 Long term (current) use of opiate analgesic: Secondary | ICD-10-CM | POA: Diagnosis not present

## 2016-02-05 DIAGNOSIS — G894 Chronic pain syndrome: Secondary | ICD-10-CM | POA: Diagnosis not present

## 2016-02-09 DIAGNOSIS — M542 Cervicalgia: Secondary | ICD-10-CM | POA: Diagnosis not present

## 2016-02-10 DIAGNOSIS — Z1211 Encounter for screening for malignant neoplasm of colon: Secondary | ICD-10-CM | POA: Diagnosis not present

## 2016-02-10 DIAGNOSIS — Z0000 Encounter for general adult medical examination without abnormal findings: Secondary | ICD-10-CM | POA: Diagnosis not present

## 2016-02-10 DIAGNOSIS — Z125 Encounter for screening for malignant neoplasm of prostate: Secondary | ICD-10-CM | POA: Diagnosis not present

## 2016-02-10 DIAGNOSIS — E785 Hyperlipidemia, unspecified: Secondary | ICD-10-CM | POA: Diagnosis not present

## 2016-02-11 DIAGNOSIS — M5030 Other cervical disc degeneration, unspecified cervical region: Secondary | ICD-10-CM | POA: Diagnosis not present

## 2016-02-11 DIAGNOSIS — M47892 Other spondylosis, cervical region: Secondary | ICD-10-CM | POA: Diagnosis not present

## 2016-02-11 DIAGNOSIS — M47812 Spondylosis without myelopathy or radiculopathy, cervical region: Secondary | ICD-10-CM | POA: Diagnosis not present

## 2016-02-16 ENCOUNTER — Other Ambulatory Visit: Payer: Self-pay | Admitting: Family

## 2016-02-16 DIAGNOSIS — R51 Headache: Secondary | ICD-10-CM | POA: Diagnosis not present

## 2016-02-16 DIAGNOSIS — M5481 Occipital neuralgia: Secondary | ICD-10-CM | POA: Diagnosis not present

## 2016-02-16 DIAGNOSIS — R5383 Other fatigue: Secondary | ICD-10-CM | POA: Diagnosis not present

## 2016-02-16 DIAGNOSIS — Z125 Encounter for screening for malignant neoplasm of prostate: Secondary | ICD-10-CM | POA: Diagnosis not present

## 2016-02-16 DIAGNOSIS — M961 Postlaminectomy syndrome, not elsewhere classified: Secondary | ICD-10-CM | POA: Diagnosis not present

## 2016-02-16 DIAGNOSIS — E785 Hyperlipidemia, unspecified: Secondary | ICD-10-CM | POA: Diagnosis not present

## 2016-02-16 DIAGNOSIS — I10 Essential (primary) hypertension: Secondary | ICD-10-CM | POA: Diagnosis not present

## 2016-02-16 DIAGNOSIS — M1288 Other specific arthropathies, not elsewhere classified, other specified site: Secondary | ICD-10-CM | POA: Diagnosis not present

## 2016-02-16 DIAGNOSIS — Z79891 Long term (current) use of opiate analgesic: Secondary | ICD-10-CM | POA: Diagnosis not present

## 2016-02-16 DIAGNOSIS — E291 Testicular hypofunction: Secondary | ICD-10-CM | POA: Diagnosis not present

## 2016-02-17 DIAGNOSIS — M47817 Spondylosis without myelopathy or radiculopathy, lumbosacral region: Secondary | ICD-10-CM | POA: Diagnosis not present

## 2016-02-17 DIAGNOSIS — M5020 Other cervical disc displacement, unspecified cervical region: Secondary | ICD-10-CM | POA: Diagnosis not present

## 2016-02-23 DIAGNOSIS — M4030 Flatback syndrome, site unspecified: Secondary | ICD-10-CM | POA: Diagnosis not present

## 2016-02-23 DIAGNOSIS — M4327 Fusion of spine, lumbosacral region: Secondary | ICD-10-CM | POA: Diagnosis not present

## 2016-02-25 DIAGNOSIS — G894 Chronic pain syndrome: Secondary | ICD-10-CM | POA: Diagnosis not present

## 2016-02-25 DIAGNOSIS — F4323 Adjustment disorder with mixed anxiety and depressed mood: Secondary | ICD-10-CM | POA: Diagnosis not present

## 2016-02-25 DIAGNOSIS — Z79891 Long term (current) use of opiate analgesic: Secondary | ICD-10-CM | POA: Diagnosis not present

## 2016-02-25 DIAGNOSIS — M961 Postlaminectomy syndrome, not elsewhere classified: Secondary | ICD-10-CM | POA: Diagnosis not present

## 2016-03-02 ENCOUNTER — Other Ambulatory Visit: Payer: Self-pay | Admitting: Family

## 2016-03-02 DIAGNOSIS — M5030 Other cervical disc degeneration, unspecified cervical region: Secondary | ICD-10-CM | POA: Diagnosis not present

## 2016-03-02 DIAGNOSIS — M47892 Other spondylosis, cervical region: Secondary | ICD-10-CM | POA: Diagnosis not present

## 2016-03-02 DIAGNOSIS — M47812 Spondylosis without myelopathy or radiculopathy, cervical region: Secondary | ICD-10-CM | POA: Diagnosis not present

## 2016-03-02 NOTE — Telephone Encounter (Signed)
Last refill was 01/26/16

## 2016-03-02 NOTE — Telephone Encounter (Signed)
Pt was wondering if Marya Amsler can send it in tomorrow morning, he is leaving town tomorrow for [employer], if we sent it in in the morning then he might be able to pick it up before he goes.

## 2016-03-24 DIAGNOSIS — M542 Cervicalgia: Secondary | ICD-10-CM | POA: Diagnosis not present

## 2016-03-28 ENCOUNTER — Other Ambulatory Visit: Payer: Self-pay | Admitting: Family

## 2016-03-29 NOTE — Telephone Encounter (Signed)
Last refill was 03/03/16

## 2016-04-01 DIAGNOSIS — G894 Chronic pain syndrome: Secondary | ICD-10-CM | POA: Diagnosis not present

## 2016-04-01 DIAGNOSIS — M961 Postlaminectomy syndrome, not elsewhere classified: Secondary | ICD-10-CM | POA: Diagnosis not present

## 2016-04-01 DIAGNOSIS — Z79891 Long term (current) use of opiate analgesic: Secondary | ICD-10-CM | POA: Diagnosis not present

## 2016-04-01 DIAGNOSIS — F4323 Adjustment disorder with mixed anxiety and depressed mood: Secondary | ICD-10-CM | POA: Diagnosis not present

## 2016-04-04 ENCOUNTER — Ambulatory Visit (INDEPENDENT_AMBULATORY_CARE_PROVIDER_SITE_OTHER): Payer: Medicare Other | Admitting: Family

## 2016-04-04 ENCOUNTER — Encounter: Payer: Self-pay | Admitting: Family

## 2016-04-04 VITALS — BP 152/96 | HR 90 | Temp 98.7°F | Resp 18 | Ht 70.0 in | Wt 182.0 lb

## 2016-04-04 DIAGNOSIS — E785 Hyperlipidemia, unspecified: Secondary | ICD-10-CM

## 2016-04-04 DIAGNOSIS — Z1283 Encounter for screening for malignant neoplasm of skin: Secondary | ICD-10-CM

## 2016-04-04 DIAGNOSIS — I10 Essential (primary) hypertension: Secondary | ICD-10-CM

## 2016-04-04 DIAGNOSIS — F39 Unspecified mood [affective] disorder: Secondary | ICD-10-CM | POA: Diagnosis not present

## 2016-04-04 DIAGNOSIS — R4586 Emotional lability: Secondary | ICD-10-CM

## 2016-04-04 MED ORDER — AMLODIPINE BESYLATE 10 MG PO TABS: 10.0000 mg | ORAL_TABLET | Freq: Every day | ORAL | Status: DC

## 2016-04-04 MED ORDER — CITALOPRAM HYDROBROMIDE 20 MG PO TABS: 20.0000 mg | ORAL_TABLET | Freq: Every day | ORAL | Status: DC

## 2016-04-04 NOTE — Progress Notes (Signed)
Pre visit review using our clinic review tool, if applicable. No additional management support is needed unless otherwise documented below in the visit note. 

## 2016-04-04 NOTE — Assessment & Plan Note (Signed)
Blood pressure remains above goal 140/90 with current regimen. Increase amlodipine. Denies symptoms of end organ damage. Continue to monitor blood pressure at home. Follow-up in one month or sooner.

## 2016-04-04 NOTE — Assessment & Plan Note (Signed)
Mood change consistent with exacerbation in anxiety. Start citalopram to assist with mood. Denies suicidal ideations. Follow up in 1 month or sooner.

## 2016-04-04 NOTE — Progress Notes (Signed)
Subjective:    Patient ID: Jeremiah Anderson, male    DOB: Dec 17, 1965, 50 y.o.   MRN: [id number]  Chief Complaint  Patient presents with  . Neck Pain    having neck and back pain, states has "flat back syndrome" referral to dermatology, citalopram, cholesterol    HPI:  Jeremiah Anderson is a 50 y.o. male who  has a past medical history of Arthritis; Hypertension; Migraines; GERD (gastroesophageal reflux disease); and Hyperlipidemia. and presents today for an office follow up.   1.) Skin changes - Indicates that he used to go the dermatologist on a yearly basis back in [place] and has not established with a dermatologist here and has several areas of skin that he would like checked. Denies any associated symptoms of changes in size and color.  2.) Cholesterol - Previously evaluated in [place] and was recommended that he start a a statin medication for his cholesterol. He is not comfortable with taking the statin medication secondary to potential muscular side effects. He is currently taking red yeast rice to help with his cholesterol.  3.) Mood changes - Associated symptom of being angry has been going on for several months and possibly fueled by his neck and back pain. Family has noted that he has been short most recently.    Allergies  Allergen Reactions  . Fentanyl     Hallucinations   . Lisinopril Swelling  . Lyrica [Pregabalin]     hallucinations  . Sulfa Antibiotics Swelling     Current Outpatient Prescriptions on File Prior to Visit  Medication Sig Dispense Refill  . butalbital-acetaminophen-caffeine (FIORICET, ESGIC) 50-325-40 MG tablet TAKE ONE TABLET BY MOUTH THREE TIMES A DAY AS NEEDED 90 tablet 0  . fluticasone (FLONASE) 50 MCG/ACT nasal spray Place 2 sprays into both nostrils daily. 16 g 6  . methocarbamol (ROBAXIN) 750 MG tablet Take 750 mg by mouth 4 (four) times daily.    Marland Kitchen morphine (MSIR) 30 MG tablet Take 30 mg by mouth every 3 (three) hours.     . ondansetron  (ZOFRAN) 4 MG tablet TAKE ONE TABLET BY MOUTH EVERY 8 HOURS AS NEEDED FOR NAUSEA OR VOMITING 20 tablet 0  . sildenafil (VIAGRA) 50 MG tablet Take 1 tablet (50 mg total) by mouth daily as needed for erectile dysfunction. 10 tablet 0   No current facility-administered medications on file prior to visit.     Past Surgical History  Procedure Laterality Date  . Appendectomy  1980s    post op bleeding sent him back for ex lap and hemostatic intervention.   . Back surgery      8 surgeries total - fused from L1-S1  . 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   . Knee surgery      Multiple bilateral scopes  . Ex lap with peri gastric vessel ligation  1990s    after retching, he rupture a blood vessel "outside" the stomach.  vessel was ligated per pt's descriiption.   . Esophagogastroduodenoscopy N/A 06/10/2015    Procedure: ESOPHAGOGASTRODUODENOSCOPY (EGD);  Surgeon: Lafayette Dragon, MD;  Location: [facility] ENDOSCOPY;  Service: Endoscopy;  Laterality: N/A;      Review of Systems  Constitutional: Negative for fever and chills.  Eyes:       Negative for changes in vision  Respiratory: Negative for cough, chest tightness and wheezing.   Cardiovascular: Negative for chest pain, palpitations and leg swelling.  Neurological: Negative for dizziness, weakness  and light-headedness.      Objective:    BP 152/96 mmHg  Pulse 90  Temp(Src) 98.7 F (37.1 C) (Oral)  Resp 18  Ht 5\' 10"  (1.778 m)  Wt 182 lb (82.555 kg)  BMI 26.11 kg/m2  SpO2 98% Nursing note and vital signs reviewed.  Physical Exam  Constitutional: He is oriented to person, place, and time. He appears well-developed and well-nourished. No distress.  Cardiovascular: Normal rate, regular rhythm, normal heart sounds and intact distal pulses.   Pulmonary/Chest: Effort normal and breath sounds normal.  Neurological: He is alert and oriented to person, place, and time.  Skin: Skin is warm and dry.  Psychiatric:  Thought content normal. His mood appears anxious. His affect is labile. His speech is rapid and/or pressured. He is hyperactive. He expresses impulsivity.       Assessment & Plan:   Problem List Items Addressed This Visit      Cardiovascular and Mediastinum   Essential hypertension - Primary    Blood pressure remains above goal 140/90 with current regimen. Increase amlodipine. Denies symptoms of end organ damage. Continue to monitor blood pressure at home. Follow-up in one month or sooner.      Relevant Medications   amLODipine (NORVASC) 10 MG tablet     Other   Mood change (HCC)    Mood change consistent with exacerbation in anxiety. Start citalopram to assist with mood. Denies suicidal ideations. Follow up in 1 month or sooner.      Relevant Medications   citalopram (CELEXA) 20 MG tablet   Hyperlipidemia    Previous a diagnosed with hyperlipidemia and not maintained on medication. Obtain lipid profile and fasting to determine current status. Continue dietary management pending lipid profile results.      Relevant Medications   amLODipine (NORVASC) 10 MG tablet   Other Relevant Orders   Lipid Profile   Skin exam, screening for cancer   Relevant Orders   Ambulatory referral to Dermatology       I have changed Mr. Bendall amLODipine. I am also having him start on citalopram. Additionally, I am having him maintain his morphine, methocarbamol, sildenafil, fluticasone, ondansetron, and butalbital-acetaminophen-caffeine.   Meds ordered this encounter  Medications  . citalopram (CELEXA) 20 MG tablet    Sig: Take 1 tablet (20 mg total) by mouth daily.    Dispense:  30 tablet    Refill:  1    Order Specific Question:  Supervising Provider    Answer:  Pricilla Holm A [id number]  . amLODipine (NORVASC) 10 MG tablet    Sig: Take 1 tablet (10 mg total) by mouth daily.    Dispense:  90 tablet    Refill:  0    Order Specific Question:  Supervising Provider    Answer:   Pricilla Holm A [id number]     Follow-up: Return in about 1 month (around 05/05/2016).  [name], FNP

## 2016-04-04 NOTE — Patient Instructions (Signed)
Thank you for choosing [facility].  Summary/Instructions:  Please continue to take your medications as prescribed.  Please increase your amlodipine to 10 mg daily (1 tablet)  Start the citalopram daily for your mood.   Please stop by the lab when fasting for 6-8 hours. No appointment needed and the lab is open from 7:30am-5:30pm.   Your prescription(s) have been submitted to your pharmacy or been printed and provided for you. Please take as directed and contact our office if you believe you are having problem(s) with the medication(s) or have any questions.  Please stop by the lab on the basement level of the building for your blood work. Your results will be released to [place] (or called to you) after review, usually within 72 hours after test completion. If any changes need to be made, you will be notified at that same time.  Please stop by radiology on the basement level of the building for your x-rays. Your results will be released to [place] (or called to you) after review, usually within 72 hours after test completion. If any treatments or changes are necessary, you will be notified at that same time.  Referrals have been made during this visit. You should expect to hear back from our schedulers in about 7-10 days in regards to establishing an appointment with the specialists we discussed.   If your symptoms worsen or fail to improve, please contact our office for further instruction, or in case of emergency go directly to the emergency room at the closest medical facility.

## 2016-04-04 NOTE — Assessment & Plan Note (Signed)
Previous a diagnosed with hyperlipidemia and not maintained on medication. Obtain lipid profile and fasting to determine current status. Continue dietary management pending lipid profile results.

## 2016-04-06 ENCOUNTER — Other Ambulatory Visit: Payer: Self-pay | Admitting: Family

## 2016-04-06 DIAGNOSIS — M5126 Other intervertebral disc displacement, lumbar region: Secondary | ICD-10-CM | POA: Diagnosis not present

## 2016-04-08 DIAGNOSIS — M5126 Other intervertebral disc displacement, lumbar region: Secondary | ICD-10-CM | POA: Diagnosis not present

## 2016-04-12 DIAGNOSIS — M5126 Other intervertebral disc displacement, lumbar region: Secondary | ICD-10-CM | POA: Diagnosis not present

## 2016-04-14 DIAGNOSIS — M4037 Flatback syndrome, lumbosacral region: Secondary | ICD-10-CM | POA: Diagnosis not present

## 2016-04-15 DIAGNOSIS — M5126 Other intervertebral disc displacement, lumbar region: Secondary | ICD-10-CM | POA: Diagnosis not present

## 2016-04-19 DIAGNOSIS — M5126 Other intervertebral disc displacement, lumbar region: Secondary | ICD-10-CM | POA: Diagnosis not present

## 2016-04-21 ENCOUNTER — Other Ambulatory Visit: Payer: Self-pay | Admitting: Family

## 2016-04-21 DIAGNOSIS — M5126 Other intervertebral disc displacement, lumbar region: Secondary | ICD-10-CM | POA: Diagnosis not present

## 2016-04-22 NOTE — Telephone Encounter (Signed)
Faxed script to [facility] pharmacy...Jeremiah Anderson

## 2016-04-26 DIAGNOSIS — M5126 Other intervertebral disc displacement, lumbar region: Secondary | ICD-10-CM | POA: Diagnosis not present

## 2016-04-28 DIAGNOSIS — M5126 Other intervertebral disc displacement, lumbar region: Secondary | ICD-10-CM | POA: Diagnosis not present

## 2016-04-29 ENCOUNTER — Other Ambulatory Visit: Payer: Self-pay | Admitting: Family

## 2016-04-29 DIAGNOSIS — F4323 Adjustment disorder with mixed anxiety and depressed mood: Secondary | ICD-10-CM | POA: Diagnosis not present

## 2016-04-29 DIAGNOSIS — G894 Chronic pain syndrome: Secondary | ICD-10-CM | POA: Diagnosis not present

## 2016-04-29 DIAGNOSIS — M961 Postlaminectomy syndrome, not elsewhere classified: Secondary | ICD-10-CM | POA: Diagnosis not present

## 2016-04-29 DIAGNOSIS — Z79891 Long term (current) use of opiate analgesic: Secondary | ICD-10-CM | POA: Diagnosis not present

## 2016-05-11 DIAGNOSIS — H43391 Other vitreous opacities, right eye: Secondary | ICD-10-CM | POA: Diagnosis not present

## 2016-05-11 DIAGNOSIS — H52223 Regular astigmatism, bilateral: Secondary | ICD-10-CM | POA: Diagnosis not present

## 2016-05-11 DIAGNOSIS — H5213 Myopia, bilateral: Secondary | ICD-10-CM | POA: Diagnosis not present

## 2016-05-11 DIAGNOSIS — H524 Presbyopia: Secondary | ICD-10-CM | POA: Diagnosis not present

## 2016-05-16 DIAGNOSIS — H43811 Vitreous degeneration, right eye: Secondary | ICD-10-CM | POA: Diagnosis not present

## 2016-05-17 DIAGNOSIS — M5126 Other intervertebral disc displacement, lumbar region: Secondary | ICD-10-CM | POA: Diagnosis not present

## 2016-05-19 DIAGNOSIS — L578 Other skin changes due to chronic exposure to nonionizing radiation: Secondary | ICD-10-CM | POA: Diagnosis not present

## 2016-05-19 DIAGNOSIS — D235 Other benign neoplasm of skin of trunk: Secondary | ICD-10-CM | POA: Diagnosis not present

## 2016-05-19 DIAGNOSIS — L821 Other seborrheic keratosis: Secondary | ICD-10-CM | POA: Diagnosis not present

## 2016-05-19 DIAGNOSIS — L814 Other melanin hyperpigmentation: Secondary | ICD-10-CM | POA: Diagnosis not present

## 2016-05-19 DIAGNOSIS — M5126 Other intervertebral disc displacement, lumbar region: Secondary | ICD-10-CM | POA: Diagnosis not present

## 2016-05-19 DIAGNOSIS — D1801 Hemangioma of skin and subcutaneous tissue: Secondary | ICD-10-CM | POA: Diagnosis not present

## 2016-05-23 ENCOUNTER — Other Ambulatory Visit: Payer: Self-pay | Admitting: Family

## 2016-05-23 NOTE — Telephone Encounter (Signed)
Last refill was 04/22/16

## 2016-05-27 ENCOUNTER — Other Ambulatory Visit (INDEPENDENT_AMBULATORY_CARE_PROVIDER_SITE_OTHER): Payer: Medicare Other

## 2016-05-27 DIAGNOSIS — Z79891 Long term (current) use of opiate analgesic: Secondary | ICD-10-CM | POA: Diagnosis not present

## 2016-05-27 DIAGNOSIS — E785 Hyperlipidemia, unspecified: Secondary | ICD-10-CM

## 2016-05-27 DIAGNOSIS — F4323 Adjustment disorder with mixed anxiety and depressed mood: Secondary | ICD-10-CM | POA: Diagnosis not present

## 2016-05-27 DIAGNOSIS — M961 Postlaminectomy syndrome, not elsewhere classified: Secondary | ICD-10-CM | POA: Diagnosis not present

## 2016-05-27 DIAGNOSIS — G894 Chronic pain syndrome: Secondary | ICD-10-CM | POA: Diagnosis not present

## 2016-05-27 LAB — LDL CHOLESTEROL, DIRECT: LDL DIRECT: 134 mg/dL

## 2016-05-27 LAB — LIPID PANEL
CHOL/HDL RATIO: 5
CHOLESTEROL: 249 mg/dL — AB (ref 0–200)
HDL: 46.4 mg/dL (ref >39.00)
NONHDL: 203.06
Triglycerides: 294 mg/dL — ABNORMAL HIGH (ref 0.0–149.0)
VLDL: 58.8 mg/dL — AB (ref 0.0–40.0)

## 2016-05-31 ENCOUNTER — Other Ambulatory Visit: Payer: Self-pay | Admitting: Family

## 2016-06-10 DIAGNOSIS — L089 Local infection of the skin and subcutaneous tissue, unspecified: Secondary | ICD-10-CM | POA: Diagnosis not present

## 2016-06-14 ENCOUNTER — Encounter: Payer: Self-pay | Admitting: Family

## 2016-06-14 ENCOUNTER — Ambulatory Visit (INDEPENDENT_AMBULATORY_CARE_PROVIDER_SITE_OTHER): Payer: Medicare Other | Admitting: Family

## 2016-06-14 DIAGNOSIS — R1900 Intra-abdominal and pelvic swelling, mass and lump, unspecified site: Secondary | ICD-10-CM | POA: Diagnosis not present

## 2016-06-14 DIAGNOSIS — F17200 Nicotine dependence, unspecified, uncomplicated: Secondary | ICD-10-CM | POA: Diagnosis not present

## 2016-06-14 DIAGNOSIS — J340 Abscess, furuncle and carbuncle of nose: Secondary | ICD-10-CM | POA: Diagnosis not present

## 2016-06-14 DIAGNOSIS — F39 Unspecified mood [affective] disorder: Secondary | ICD-10-CM | POA: Diagnosis not present

## 2016-06-14 DIAGNOSIS — E663 Overweight: Secondary | ICD-10-CM

## 2016-06-14 DIAGNOSIS — R4586 Emotional lability: Secondary | ICD-10-CM

## 2016-06-14 DIAGNOSIS — R1909 Other intra-abdominal and pelvic swelling, mass and lump: Secondary | ICD-10-CM | POA: Insufficient documentation

## 2016-06-14 MED ORDER — MUPIROCIN 2 % EX OINT: 1.0000 "application " | TOPICAL_OINTMENT | Freq: Two times a day (BID) | CUTANEOUS | 0 refills | Status: DC

## 2016-06-14 NOTE — Assessment & Plan Note (Signed)
Continues to smoke on a some day basis and is not ready to quit at this time. Encouraged quitting and the benefits associated with it in prevention of chronic diseases and malignancy in the future. Continue to monitor.

## 2016-06-14 NOTE — Assessment & Plan Note (Signed)
Small abscess of the nasal cavity appears to be healing well with no concern for worsening. Continue current dosage of mupirocin. Follow-up if symptoms worsen or do not improve.

## 2016-06-14 NOTE — Progress Notes (Signed)
Subjective:    Patient ID: Jeremiah Anderson, male    DOB: 02-22-1966, 50 y.o.   MRN: [id number]  Chief Complaint  Patient presents with  . nose pain    says that he was having nose pain that was so bad that it was going to his head, mom looked up his nose and saw a puss sore and said he had staph, referral to eye doctor    HPI:  Jeremiah Anderson is a 50 y.o. male who  has a past medical history of Arthritis; GERD (gastroesophageal reflux disease); Hyperlipidemia; Hypertension; and Migraines. and presents today for an office visit.   1.) Nose pain - This is a new problem. Associated symptom of pain located in his nose has been going on for about 1 week. He was concern for a potential staff infection. He was seen in an Urgent Care with no significant findings. He was instructed to continue his mother's prescription of mupirocin. Course of the symptoms have generally improved since initial onset.  No discharge or fevers.  2.) Mood changes - This is a chronic problem. Associated symptom of lack of motivation has been going on for several months. Currently maintained on citalopram for anger/depressive symptoms. Reports taking the medication as prescribed and denies adverse side effects.    3.) Lump on side - This is a new problem. Associated symptom of a mass located on his right flank has been going on for the last several months. Described as increasing in size with increased intrathoracic pressure, coughing, straining, or sneezing. There is no pain associated with it. Denies any modifying factors or attempted treatments. Did have previous back surgery through a portal located in that area.    Allergies  Allergen Reactions  . Fentanyl     Hallucinations   . Lisinopril Swelling  . Lyrica [Pregabalin]     hallucinations  . Sulfa Antibiotics Swelling     Current Outpatient Prescriptions on File Prior to Visit  Medication Sig Dispense Refill  . amLODipine (NORVASC) 10 MG tablet Take 1  tablet (10 mg total) by mouth daily. 90 tablet 0  . butalbital-acetaminophen-caffeine (FIORICET, ESGIC) 50-325-40 MG tablet TAKE ONE TABLET BY MOUTH THREE TIMES A DAY AS NEEDED 90 tablet 0  . citalopram (CELEXA) 20 MG tablet TAKE ONE TABLET BY MOUTH ONCE DAILY 30 tablet 0  . fluticasone (FLONASE) 50 MCG/ACT nasal spray Place 2 sprays into both nostrils daily. 16 g 6  . methocarbamol (ROBAXIN) 750 MG tablet Take 750 mg by mouth 4 (four) times daily.    Marland Kitchen morphine (MSIR) 30 MG tablet Take 30 mg by mouth every 3 (three) hours.     . ondansetron (ZOFRAN) 4 MG tablet TAKE ONE TABLET BY MOUTH EVERY 8 HOURS AS NEEDED FOR NAUSEA OR VOMITING 20 tablet 0  . sildenafil (VIAGRA) 50 MG tablet Take 1 tablet (50 mg total) by mouth daily as needed for erectile dysfunction. 10 tablet 0   No current facility-administered medications on file prior to visit.     Past Medical History:  Diagnosis Date  . Arthritis   . GERD (gastroesophageal reflux disease)   . Hyperlipidemia   . Hypertension   . Migraines     Review of Systems  Constitutional: Negative for chills and fever.  HENT: Negative for congestion, nosebleeds and postnasal drip.        Positive nose pain.   Genitourinary: Negative for discharge, dysuria, frequency, hematuria, penile pain and urgency.  Musculoskeletal:  Mass of right flank.  Psychiatric/Behavioral: Negative for agitation, confusion, dysphoric mood, self-injury, sleep disturbance and suicidal ideas. The patient is nervous/anxious.       Objective:    BP 136/86 (BP Location: Left Arm, Patient Position: Sitting, Cuff Size: Normal)   Pulse 88   Temp 98.4 F (36.9 C) (Oral)   Resp 16   Ht 5\' 10"  (1.778 m)   Wt 178 lb (80.7 kg)   SpO2 96%   BMI 25.54 kg/m  Nursing note and vital signs reviewed.  Physical Exam  Constitutional: He is oriented to person, place, and time. He appears well-developed and well-nourished. No distress.  HENT:  Small nasal lesion located in the  right nasal cavity. No significant inflammation or discharge present.   Cardiovascular: Normal rate, regular rhythm, normal heart sounds and intact distal pulses.   Pulmonary/Chest: Effort normal and breath sounds normal.  Musculoskeletal:  Right flank - Previous well healed surgical incision noted. There is a small bulge that is present that is soft and increased in size with valsalva maneuver. No evidence of strangulation or gangrene. No tenderness able to be elicited.   Neurological: He is alert and oriented to person, place, and time.  Skin: Skin is warm and dry.  Psychiatric: He has a normal mood and affect. His behavior is normal. Judgment and thought content normal.       Assessment & Plan:   Problem List Items Addressed This Visit      Respiratory   Abscess of nasal cavity    Small abscess of the nasal cavity appears to be healing well with no concern for worsening. Continue current dosage of mupirocin. Follow-up if symptoms worsen or do not improve.      Relevant Medications   mupirocin ointment (BACTROBAN) 2 %     Other   Mood change (HCC)    No changes appears stable with current dosage of citalopram and less agitation noted. No adverse side effects appreciated. Continue current dosage of citalopram.      Right flank mass    Right flank mass most likely associated with surgical herniation of musculature with no evidence of strangulation or gangrene. Given lack of pain/tenderness, recommend continue to monitor at this time and follow-up if symptoms worsen or become painful. Continue to monitor.      Tobacco use disorder    Continues to smoke on a some day basis and is not ready to quit at this time. Encouraged quitting and the benefits associated with it in prevention of chronic diseases and malignancy in the future. Continue to monitor.       Overweight (BMI 25.0-29.9)    BMI of 25.54. He is generally of good body weight. Encouraged to lose about 5 pounds to optimize  BMI through nutrition and physical activity. Continue to monitor.        Other Visit Diagnoses   None.      I am having Mr. Lanson start on mupirocin ointment. I am also having him maintain his morphine, methocarbamol, sildenafil, fluticasone, amLODipine, butalbital-acetaminophen-caffeine, ondansetron, and citalopram.   Meds ordered this encounter  Medications  . mupirocin ointment (BACTROBAN) 2 %    Sig: Place 1 application into the nose 2 (two) times daily.    Dispense:  22 g    Refill:  0    Order Specific Question:   Supervising Provider    Answer:   Pricilla Holm A [id number]     Follow-up: Return in about 3 months (around 09/14/2016),  or if symptoms worsen or fail to improve.  [name], FNP

## 2016-06-14 NOTE — Assessment & Plan Note (Signed)
Right flank mass most likely associated with surgical herniation of musculature with no evidence of strangulation or gangrene. Given lack of pain/tenderness, recommend continue to monitor at this time and follow-up if symptoms worsen or become painful. Continue to monitor.

## 2016-06-14 NOTE — Patient Instructions (Addendum)
Thank you for choosing [facility].  Summary/Instructions:  They will call to schedule your psychology appointment.   Your prescription(s) have been submitted to your pharmacy or been printed and provided for you. Please take as directed and contact our office if you believe you are having problem(s) with the medication(s) or have any questions.  If your symptoms worsen or fail to improve, please contact our office for further instruction, or in case of emergency go directly to the emergency room at the closest medical facility.    Abscess An abscess is an infected area that contains a collection of pus and debris.It can occur in almost any part of the body. An abscess is also known as a furuncle or boil. CAUSES  An abscess occurs when tissue gets infected. This can occur from blockage of oil or sweat glands, infection of hair follicles, or a minor injury to the skin. As the body tries to fight the infection, pus collects in the area and creates pressure under the skin. This pressure causes pain. People with weakened immune systems have difficulty fighting infections and get certain abscesses more often.  SYMPTOMS Usually an abscess develops on the skin and becomes a painful mass that is red, warm, and tender. If the abscess forms under the skin, you may feel a moveable soft area under the skin. Some abscesses break open (rupture) on their own, but most will continue to get worse without care. The infection can spread deeper into the body and eventually into the bloodstream, causing you to feel ill.  DIAGNOSIS  Your caregiver will take your medical history and perform a physical exam. A sample of fluid may also be taken from the abscess to determine what is causing your infection. TREATMENT  Your caregiver may prescribe antibiotic medicines to fight the infection. However, taking antibiotics alone usually does not cure an abscess. Your caregiver may need to make a small cut (incision) in  the abscess to drain the pus. In some cases, gauze is packed into the abscess to reduce pain and to continue draining the area. HOME CARE INSTRUCTIONS   Only take over-the-counter or prescription medicines for pain, discomfort, or fever as directed by your caregiver.  If you were prescribed antibiotics, take them as directed. Finish them even if you start to feel better.  If gauze is used, follow your caregiver's directions for changing the gauze.  To avoid spreading the infection:  Keep your draining abscess covered with a bandage.  Wash your hands well.  Do not share personal care items, towels, or whirlpools with others.  Avoid skin contact with others.  Keep your skin and clothes clean around the abscess.  Keep all follow-up appointments as directed by your caregiver. SEEK MEDICAL CARE IF:   You have increased pain, swelling, redness, fluid drainage, or bleeding.  You have muscle aches, chills, or a general ill feeling.  You have a fever. MAKE SURE YOU:   Understand these instructions.  Will watch your condition.  Will get help right away if you are not doing well or get worse.   This information is not intended to replace advice given to you by your health care provider. Make sure you discuss any questions you have with your health care provider.   Document Released: 08/17/2005 Document Revised: 05/08/2012 Document Reviewed: 01/20/2012 Elsevier Interactive Patient Education [employer].

## 2016-06-14 NOTE — Assessment & Plan Note (Signed)
BMI of 25.54. He is generally of good body weight. Encouraged to lose about 5 pounds to optimize BMI through nutrition and physical activity. Continue to monitor.

## 2016-06-14 NOTE — Assessment & Plan Note (Signed)
No changes appears stable with current dosage of citalopram and less agitation noted. No adverse side effects appreciated. Continue current dosage of citalopram.

## 2016-06-15 ENCOUNTER — Other Ambulatory Visit: Payer: Self-pay | Admitting: Family

## 2016-06-15 NOTE — Telephone Encounter (Signed)
Please advise. Last refill was 05/23/16

## 2016-06-21 DIAGNOSIS — F4323 Adjustment disorder with mixed anxiety and depressed mood: Secondary | ICD-10-CM | POA: Diagnosis not present

## 2016-06-21 DIAGNOSIS — Z79891 Long term (current) use of opiate analgesic: Secondary | ICD-10-CM | POA: Diagnosis not present

## 2016-06-21 DIAGNOSIS — M961 Postlaminectomy syndrome, not elsewhere classified: Secondary | ICD-10-CM | POA: Diagnosis not present

## 2016-06-21 DIAGNOSIS — G894 Chronic pain syndrome: Secondary | ICD-10-CM | POA: Diagnosis not present

## 2016-06-29 DIAGNOSIS — M4720 Other spondylosis with radiculopathy, site unspecified: Secondary | ICD-10-CM | POA: Diagnosis not present

## 2016-06-29 DIAGNOSIS — M961 Postlaminectomy syndrome, not elsewhere classified: Secondary | ICD-10-CM | POA: Diagnosis not present

## 2016-06-29 DIAGNOSIS — M5481 Occipital neuralgia: Secondary | ICD-10-CM | POA: Diagnosis not present

## 2016-06-29 DIAGNOSIS — M4037 Flatback syndrome, lumbosacral region: Secondary | ICD-10-CM | POA: Diagnosis not present

## 2016-07-06 DIAGNOSIS — M5126 Other intervertebral disc displacement, lumbar region: Secondary | ICD-10-CM | POA: Diagnosis not present

## 2016-07-09 ENCOUNTER — Other Ambulatory Visit: Payer: Self-pay | Admitting: Family

## 2016-07-12 ENCOUNTER — Encounter: Payer: Self-pay | Admitting: Family

## 2016-07-12 ENCOUNTER — Ambulatory Visit (INDEPENDENT_AMBULATORY_CARE_PROVIDER_SITE_OTHER): Payer: Medicare Other | Admitting: Family

## 2016-07-12 VITALS — BP 150/98 | HR 74 | Temp 98.2°F | Resp 16 | Ht 70.0 in | Wt 177.0 lb

## 2016-07-12 DIAGNOSIS — R109 Unspecified abdominal pain: Secondary | ICD-10-CM | POA: Insufficient documentation

## 2016-07-12 DIAGNOSIS — F39 Unspecified mood [affective] disorder: Secondary | ICD-10-CM | POA: Diagnosis not present

## 2016-07-12 DIAGNOSIS — R0781 Pleurodynia: Secondary | ICD-10-CM

## 2016-07-12 DIAGNOSIS — R0789 Other chest pain: Secondary | ICD-10-CM | POA: Diagnosis not present

## 2016-07-12 DIAGNOSIS — R4586 Emotional lability: Secondary | ICD-10-CM

## 2016-07-12 NOTE — Patient Instructions (Signed)
Thank you for choosing [facility].  Summary/Instructions:  Ice / heat as needed.  Recommend follow up with Dr. Danella Penton.  We will work to get an ultrasound cover.   If your symptoms worsen or fail to improve, please contact our office for further instruction, or in case of emergency go directly to the emergency room at the closest medical facility.

## 2016-07-12 NOTE — Progress Notes (Signed)
Subjective:    Patient ID: Annan Lemarr, male    DOB: 1966-04-11, 50 y.o.   MRN: [id number]  Chief Complaint  Patient presents with  . Flank Pain    having pain in his right side where he had surgery, feels like something is catching in his side and it is causing pain    HPI:  Brevan Dimattia is a 50 y.o. male who  has a past medical history of Arthritis; GERD (gastroesophageal reflux disease); Hyperlipidemia; Hypertension; and Migraines. and presents today for a follow up office visit.   1.) Flank pain - This is a new problem. Associated symptom of pain located on his right side where he had surgery about 2 years ago has been going on for about 1 week. Described as catching and causing pain. Denies any trauma or injury. Described as a constant pain with occasional increased intensity. Denies any urinary symptoms or hematuria. No fevers. Does feel a small mass at the site with straining.   Allergies  Allergen Reactions  . Fentanyl     Hallucinations   . Lisinopril Swelling  . Lyrica [Pregabalin]     hallucinations  . Sulfa Antibiotics Swelling     Current Outpatient Prescriptions on File Prior to Visit  Medication Sig Dispense Refill  . amLODipine (NORVASC) 10 MG tablet Take 1 tablet (10 mg total) by mouth daily. 90 tablet 0  . butalbital-acetaminophen-caffeine (FIORICET, ESGIC) 50-325-40 MG tablet TAKE ONE TABLET BY MOUTH THREE TIMES A DAY AS NEEDED 90 tablet 0  . citalopram (CELEXA) 20 MG tablet TAKE ONE TABLET BY MOUTH ONCE DAILY 30 tablet 0  . fluticasone (FLONASE) 50 MCG/ACT nasal spray Place 2 sprays into both nostrils daily. 16 g 6  . methocarbamol (ROBAXIN) 750 MG tablet Take 750 mg by mouth 4 (four) times daily.    Marland Kitchen morphine (MSIR) 30 MG tablet Take 30 mg by mouth every 3 (three) hours.     . mupirocin ointment (BACTROBAN) 2 % Place 1 application into the nose 2 (two) times daily. 22 g 0  . ondansetron (ZOFRAN) 4 MG tablet TAKE ONE TABLET BY MOUTH EVERY 8 HOURS AS  NEEDED FOR NAUSEA OR  VOMITING 20 tablet 0  . sildenafil (VIAGRA) 50 MG tablet Take 1 tablet (50 mg total) by mouth daily as needed for erectile dysfunction. 10 tablet 0   No current facility-administered medications on file prior to visit.     Review of Systems  Constitutional: Negative for chills and fever.  Respiratory: Negative for chest tightness and shortness of breath.   Cardiovascular: Negative for chest pain, palpitations and leg swelling.  Genitourinary: Positive for flank pain. Negative for difficulty urinating, frequency, hematuria and urgency.      Objective:    BP (!) 150/98 (BP Location: Left Arm, Patient Position: Sitting, Cuff Size: Normal)   Pulse 74   Temp 98.2 F (36.8 C) (Oral)   Resp 16   Ht 5\' 10"  (1.778 m)   Wt 177 lb (80.3 kg)   SpO2 97%   BMI 25.40 kg/m  Nursing note and vital signs reviewed.  Physical Exam  Constitutional: He is oriented to person, place, and time. He appears well-developed and well-nourished. No distress.  Cardiovascular: Normal rate, regular rhythm, normal heart sounds and intact distal pulses.   Pulmonary/Chest: Effort normal and breath sounds normal.      Neurological: He is alert and oriented to person, place, and time.  Skin: Skin is warm and dry.  Psychiatric: He has  a normal mood and affect. His behavior is normal. Judgment and thought content normal.       Assessment & Plan:   Problem List Items Addressed This Visit      Other   Rib pain on right side    Previously noted to have small mass with concern for possible incisional hernia from previous surgery with increased tenderness noted in the last week. No significant findings upon exam. Pain control remains difficult. Recommend non-pharmacological treatment including ice/heat. Severity of pain appears to be effecting his current functionality. Advised to see emergency care if conservative therapy is not successful or if symptoms worsen.       Mood change ([place]) -  Primary   Relevant Orders   Ambulatory referral to Psychology   Right-sided chest wall pain    Other Visit Diagnoses   None.      I am having Mr. Padberg maintain his morphine, methocarbamol, sildenafil, fluticasone, amLODipine, mupirocin ointment, butalbital-acetaminophen-caffeine, citalopram, and ondansetron.   Follow-up: Return if symptoms worsen or fail to improve.  [name], FNP

## 2016-07-12 NOTE — Assessment & Plan Note (Signed)
Previously noted to have small mass with concern for possible incisional hernia from previous surgery with increased tenderness noted in the last week. No significant findings upon exam. Pain control remains difficult. Recommend non-pharmacological treatment including ice/heat. Severity of pain appears to be effecting his current functionality. Advised to see emergency care if conservative therapy is not successful or if symptoms worsen.

## 2016-07-13 DIAGNOSIS — K439 Ventral hernia without obstruction or gangrene: Secondary | ICD-10-CM | POA: Diagnosis not present

## 2016-07-18 ENCOUNTER — Other Ambulatory Visit: Payer: Self-pay | Admitting: Family

## 2016-07-19 ENCOUNTER — Telehealth: Payer: Self-pay | Admitting: Family

## 2016-07-19 DIAGNOSIS — Z79891 Long term (current) use of opiate analgesic: Secondary | ICD-10-CM | POA: Diagnosis not present

## 2016-07-19 DIAGNOSIS — M961 Postlaminectomy syndrome, not elsewhere classified: Secondary | ICD-10-CM | POA: Diagnosis not present

## 2016-07-19 DIAGNOSIS — G894 Chronic pain syndrome: Secondary | ICD-10-CM | POA: Diagnosis not present

## 2016-07-19 DIAGNOSIS — K432 Incisional hernia without obstruction or gangrene: Secondary | ICD-10-CM | POA: Diagnosis not present

## 2016-07-19 NOTE — Telephone Encounter (Signed)
Patient has called in regard to this °

## 2016-07-19 NOTE — Telephone Encounter (Signed)
The request was sent to behavioral health about 1 week ago and they will contact patient. That is the latest that I have available.

## 2016-07-19 NOTE — Telephone Encounter (Signed)
Please follow up with patient in regard to psychiatrist referral.

## 2016-07-20 NOTE — Telephone Encounter (Signed)
Fioricet has been faxed to pharmacy. Pt aware.

## 2016-07-20 NOTE — Telephone Encounter (Signed)
Pt aware.

## 2016-07-20 NOTE — Telephone Encounter (Signed)
Last refill was 7/31

## 2016-07-29 ENCOUNTER — Ambulatory Visit (INDEPENDENT_AMBULATORY_CARE_PROVIDER_SITE_OTHER): Payer: Medicare Other | Admitting: Psychology

## 2016-07-29 DIAGNOSIS — F901 Attention-deficit hyperactivity disorder, predominantly hyperactive type: Secondary | ICD-10-CM

## 2016-08-03 ENCOUNTER — Other Ambulatory Visit: Payer: Self-pay | Admitting: Family

## 2016-08-08 DIAGNOSIS — K458 Other specified abdominal hernia without obstruction or gangrene: Secondary | ICD-10-CM | POA: Diagnosis not present

## 2016-08-10 ENCOUNTER — Ambulatory Visit (INDEPENDENT_AMBULATORY_CARE_PROVIDER_SITE_OTHER): Payer: Medicare Other | Admitting: Psychology

## 2016-08-10 ENCOUNTER — Other Ambulatory Visit: Payer: Self-pay | Admitting: Surgery

## 2016-08-10 DIAGNOSIS — F901 Attention-deficit hyperactivity disorder, predominantly hyperactive type: Secondary | ICD-10-CM | POA: Diagnosis not present

## 2016-08-10 DIAGNOSIS — K458 Other specified abdominal hernia without obstruction or gangrene: Secondary | ICD-10-CM

## 2016-08-12 ENCOUNTER — Other Ambulatory Visit: Payer: Self-pay | Admitting: Family

## 2016-08-15 NOTE — Telephone Encounter (Signed)
Last refill was 07/20/16

## 2016-08-16 ENCOUNTER — Ambulatory Visit
Admission: RE | Admit: 2016-08-16 | Discharge: 2016-08-16 | Disposition: A | Discharge status: Home / Self Care | Payer: Medicare Other | Source: Ambulatory Visit | Attending: Surgery | Admitting: Surgery

## 2016-08-16 DIAGNOSIS — K432 Incisional hernia without obstruction or gangrene: Secondary | ICD-10-CM | POA: Diagnosis not present

## 2016-08-16 DIAGNOSIS — G894 Chronic pain syndrome: Secondary | ICD-10-CM | POA: Diagnosis not present

## 2016-08-16 DIAGNOSIS — Z79891 Long term (current) use of opiate analgesic: Secondary | ICD-10-CM | POA: Diagnosis not present

## 2016-08-16 DIAGNOSIS — M961 Postlaminectomy syndrome, not elsewhere classified: Secondary | ICD-10-CM | POA: Diagnosis not present

## 2016-08-16 DIAGNOSIS — K458 Other specified abdominal hernia without obstruction or gangrene: Secondary | ICD-10-CM

## 2016-08-16 DIAGNOSIS — K460 Unspecified abdominal hernia with obstruction, without gangrene: Secondary | ICD-10-CM | POA: Diagnosis not present

## 2016-08-16 MED ORDER — IOPAMIDOL (ISOVUE-300) INJECTION 61%
100.0000 mL | Freq: Once | INTRAVENOUS | Status: AC | PRN
  Administered 2016-08-16: 100 mL via INTRAVENOUS

## 2016-08-16 NOTE — Telephone Encounter (Signed)
Left msg on triage stating pharmacy will be sending request to have his Fioricet refill. Needing med to be refill ASAP going out of town tomorrow...Jeremiah Anderson

## 2016-08-17 NOTE — Telephone Encounter (Signed)
Could not locate rx call refill into pharmacy left on pharmacy vm...Johny Chess

## 2016-08-19 ENCOUNTER — Ambulatory Visit (INDEPENDENT_AMBULATORY_CARE_PROVIDER_SITE_OTHER): Payer: Medicare Other | Admitting: Psychology

## 2016-08-19 DIAGNOSIS — F901 Attention-deficit hyperactivity disorder, predominantly hyperactive type: Secondary | ICD-10-CM | POA: Diagnosis not present

## 2016-08-22 ENCOUNTER — Telehealth: Payer: Self-pay | Admitting: Family

## 2016-08-22 ENCOUNTER — Ambulatory Visit: Payer: Medicare Other | Admitting: Podiatry

## 2016-08-22 NOTE — Telephone Encounter (Signed)
Patient Name: Jeremiah Anderson DOB: [birth year] Initial Comment Caller states her son is on Celexa, he's having trouble walking, speech slurred. Memory loss. Nurse Assessment Nurse: Wayne Sever, RN, Tillie Rung Date/Time (Eastern Time): 08/22/2016 10:41:06 AM Confirm and document reason for call. If symptomatic, describe symptoms. You must click the next button to save text entered. ---Mother states he has been feeling odd for a few months. Mother states she has narrowed it down to his Celexa. She states it's the only new medication he is on. Mother states she is not with him and is on the way home from [facility]. She states he is asleep at home so I cannot speak with him. Told mother to call back when she gets home or have him call us for triage. She states understanding. Has the patient traveled out of the country within the last 30 days? ---Not Applicable Does the patient have any new or worsening symptoms? ---Yes Will a triage be completed? ---No Select reason for no triage. ---Other Please document clinical information provided and list any resource used. ---See note as to why I cannot triage him Guidelines Guideline Title Affirmed Question Affirmed Notes Final Disposition User Clinical Call Wayne Sever, RN, Tillie Rung

## 2016-08-25 DIAGNOSIS — K458 Other specified abdominal hernia without obstruction or gangrene: Secondary | ICD-10-CM | POA: Diagnosis not present

## 2016-08-26 ENCOUNTER — Ambulatory Visit (INDEPENDENT_AMBULATORY_CARE_PROVIDER_SITE_OTHER): Payer: Medicare Other | Admitting: Psychology

## 2016-08-26 DIAGNOSIS — F901 Attention-deficit hyperactivity disorder, predominantly hyperactive type: Secondary | ICD-10-CM

## 2016-08-31 DIAGNOSIS — H43811 Vitreous degeneration, right eye: Secondary | ICD-10-CM | POA: Diagnosis not present

## 2016-08-31 DIAGNOSIS — H43392 Other vitreous opacities, left eye: Secondary | ICD-10-CM | POA: Diagnosis not present

## 2016-08-31 DIAGNOSIS — H31092 Other chorioretinal scars, left eye: Secondary | ICD-10-CM | POA: Diagnosis not present

## 2016-08-31 DIAGNOSIS — H33311 Horseshoe tear of retina without detachment, right eye: Secondary | ICD-10-CM | POA: Diagnosis not present

## 2016-08-31 DIAGNOSIS — H2513 Age-related nuclear cataract, bilateral: Secondary | ICD-10-CM | POA: Diagnosis not present

## 2016-09-01 ENCOUNTER — Other Ambulatory Visit: Payer: Self-pay | Admitting: Family

## 2016-09-01 DIAGNOSIS — H43813 Vitreous degeneration, bilateral: Secondary | ICD-10-CM | POA: Diagnosis not present

## 2016-09-01 DIAGNOSIS — H33311 Horseshoe tear of retina without detachment, right eye: Secondary | ICD-10-CM | POA: Diagnosis not present

## 2016-09-02 ENCOUNTER — Ambulatory Visit (INDEPENDENT_AMBULATORY_CARE_PROVIDER_SITE_OTHER): Payer: Medicare Other | Admitting: Psychology

## 2016-09-02 DIAGNOSIS — F901 Attention-deficit hyperactivity disorder, predominantly hyperactive type: Secondary | ICD-10-CM

## 2016-09-06 DIAGNOSIS — K458 Other specified abdominal hernia without obstruction or gangrene: Secondary | ICD-10-CM | POA: Diagnosis not present

## 2016-09-09 ENCOUNTER — Ambulatory Visit (INDEPENDENT_AMBULATORY_CARE_PROVIDER_SITE_OTHER): Payer: Medicare Other | Admitting: Psychology

## 2016-09-09 DIAGNOSIS — F901 Attention-deficit hyperactivity disorder, predominantly hyperactive type: Secondary | ICD-10-CM

## 2016-09-12 ENCOUNTER — Other Ambulatory Visit: Payer: Self-pay | Admitting: Family

## 2016-09-12 NOTE — Telephone Encounter (Signed)
Las refill was 08/16/16

## 2016-09-13 DIAGNOSIS — H33311 Horseshoe tear of retina without detachment, right eye: Secondary | ICD-10-CM | POA: Diagnosis not present

## 2016-09-13 NOTE — Telephone Encounter (Signed)
Rx faxed

## 2016-09-14 ENCOUNTER — Ambulatory Visit: Payer: Self-pay | Admitting: General Surgery

## 2016-09-14 DIAGNOSIS — G894 Chronic pain syndrome: Secondary | ICD-10-CM | POA: Diagnosis not present

## 2016-09-14 DIAGNOSIS — R51 Headache: Secondary | ICD-10-CM | POA: Diagnosis not present

## 2016-09-14 DIAGNOSIS — K432 Incisional hernia without obstruction or gangrene: Secondary | ICD-10-CM | POA: Diagnosis not present

## 2016-09-14 DIAGNOSIS — M4722 Other spondylosis with radiculopathy, cervical region: Secondary | ICD-10-CM | POA: Diagnosis not present

## 2016-09-14 DIAGNOSIS — F4323 Adjustment disorder with mixed anxiety and depressed mood: Secondary | ICD-10-CM | POA: Diagnosis not present

## 2016-09-14 DIAGNOSIS — M961 Postlaminectomy syndrome, not elsewhere classified: Secondary | ICD-10-CM | POA: Diagnosis not present

## 2016-09-14 DIAGNOSIS — Z79891 Long term (current) use of opiate analgesic: Secondary | ICD-10-CM | POA: Diagnosis not present

## 2016-09-14 NOTE — H&P (Signed)
History of Present Illness Ralene Ok MD; 09/06/2016 11:10 AM) The patient is a 50 year old male who presents for an evaluation of a hernia. Patient is a 50 year old male who is previously by Dr. Armandina Gemma for evaluation of a right flank hernia. Patient states that several years ago he had a back fusion via a retroperitoneal approach on the right side. He states that over the last several years he noticed a bulge in the right flank area. He also noticed some pain in that area. He was initially told that the pain was secondary to the back fusion. He states that the pain is sporadically on and off. Patient underwent CT scan which revealed a right flank hernia.  Patient also has had a previous laparotomy 2 for a AVM rupture and a appendectomy with postoperative bleeding.    Allergies (Sonya Bynum, CMA; 09/06/2016 10:42 AM) FentaNYL *ANALGESICS - OPIOID* Sulfa 10 *OPHTHALMIC AGENTS*  Medication History (Sonya Bynum, CMA; 09/06/2016 10:43 AM) AmLODIPine Besylate (10MG  Tablet, Oral half of an tablet) Active. Flonase (50MCG/ACT Suspension, Nasal) Active. Morphine Sulfate (30MG  Tablet, Oral every three hours) Active. Robaxin-750 (750MG  Tablet, Oral) Active. Mupirocin (2% Ointment, External) Active. Ondansetron HCl (4MG  Tablet, Oral) Active. Citalopram Hydrobromide (20MG  Tablet, Oral) Active. Butalbital-APAP-Caffeine (50-325-40MG  Tablet, Oral) Active. CeleXA (20MG  Tablet, Oral daily) Active. Medications Reconciled  Vitals (Sonya Bynum CMA; 09/06/2016 10:42 AM) 09/06/2016 10:42 AM Weight: 179 lb Height: 71in Body Surface Area: 2.01 m Body Mass Index: 24.97 kg/m  Temp.: 98.20F(Temporal)  Pulse: 78 (Regular)  BP: 126/76 (Sitting, Left Arm, Standard)       Physical Exam Ralene Ok MD; 09/06/2016 11:11 AM) General Mental Status-Alert. General Appearance-Consistent with stated age. Hydration-Well hydrated. Voice-Normal.  Chest and Lung  Exam Chest and lung exam reveals -quiet, even and easy respiratory effort with no use of accessory muscles and on auscultation, normal breath sounds, no adventitious sounds and normal vocal resonance. Inspection Chest Wall - Normal. Back - normal.  Cardiovascular Cardiovascular examination reveals -normal heart sounds, regular rate and rhythm with no murmurs and normal pedal pulses bilaterally.  Abdomen Note: Midline laparotomy incision, right retroperitoneal incision, with a bulge and Valsalva. Difficult to palpate the edges of the hernia.     Assessment & Plan Ralene Ok MD; 09/06/2016 11:12 AM) [place] HERNIA (K45.8) Impression: 50 year old male with a right incisional hernia, flank hernia  1. A long discussion the patient regards to the possible approach to fix the right flank hernia. I discussed with him secondary to his laparotomies in the past this would likely require an open repair. We discussed suturing the muscles to reapproximate the area as well as placing mesh. We discussed underlay versus inlay versus overlay. 2. The patient at this time would like to discuss possible proceeding with surgery with his family. I discussed with patient that if he decides over the next one to 2 months with a likely schedule the surgery thereafter over the phone. If globulin that. Time will have him follow back up to we discussed surgery. I did discuss with him the risks and benefits of the procedure to include not limited to: Infection, bleeding, damage to structures, possible wound healing issues, possible recurrence. The patient did understand these risks.

## 2016-09-15 ENCOUNTER — Ambulatory Visit: Payer: Self-pay | Admitting: General Surgery

## 2016-09-16 ENCOUNTER — Ambulatory Visit: Payer: Medicare Other | Admitting: Psychology

## 2016-09-19 ENCOUNTER — Ambulatory Visit (INDEPENDENT_AMBULATORY_CARE_PROVIDER_SITE_OTHER): Payer: Medicare Other | Admitting: Specialist

## 2016-09-19 ENCOUNTER — Encounter (INDEPENDENT_AMBULATORY_CARE_PROVIDER_SITE_OTHER): Payer: Self-pay | Admitting: Specialist

## 2016-09-19 VITALS — BP 130/96 | HR 106 | Ht 71.0 in | Wt 177.0 lb

## 2016-09-19 DIAGNOSIS — Z09 Encounter for follow-up examination after completed treatment for conditions other than malignant neoplasm: Secondary | ICD-10-CM

## 2016-09-19 NOTE — Progress Notes (Signed)
Office Visit Note   Patient: Jeremiah Anderson           Date of Birth: 1966/09/04           MRN: [id number] Visit Date: 09/19/2016              Requested by: [address], [place], [place] [address] PCP: [name], FNP   Assessment & Plan: Visit Diagnoses:  1. Status post right inguinal hernia repair, follow-up exam     Plan: Avoiding heavier lifting,  No lifitng greater than 10 lbs Consider hernia surgery to relieve your flank pain from..     Follow-Up Instructions: Return in about 3 months (around 12/20/2016).   Orders:  No orders of the defined types were placed in this encounter.  No orders of the defined types were placed in this encounter.     Procedures: No procedures performed   Clinical Data: No additional findings.   Subjective: Chief Complaint  Patient presents with  . Lower Back - Follow-up    Patient is returning today as 2 month follow up of low back pain. States his back pain is about the same. Notices he leans forward a lot. Also would like to discuss the hernia surgery that he is scheduling. The general surgeons are considering use of a mess to reconstruct the right abdomenal wall for a flank hernia. Seen by general surgeon at [facility] [place] recommended. An attempt at trussing the area not really of much benefit. Now wearing TLSO regular position.    Review of Systems   Objective: Vital Signs: BP (!) 130/96   Pulse (!) 106   Ht 5\' 11"  (1.803 m)   Wt 177 lb (80.3 kg)   BMI 24.69 kg/m   Physical Exam  Back Exam   Tenderness  The patient is experiencing tenderness in the thoracic and lumbar.  Range of Motion  Extension: normal  Flexion: normal  Lateral Bend Right: normal  Lateral Bend Left: normal  Rotation Right: normal  Rotation Left: normal   Muscle Strength  Right Quadriceps:  5/5  Left Quadriceps:  5/5  Right Hamstrings:  5/5  Left Hamstrings:  5/5   Tests  Straight leg raise  right: negative Straight leg raise left: negative  Reflexes  Patellar:  1/4 abnormal Achilles:  2/4 normal Biceps: 2/4 Babinski's sign: abnormal   Other  Toe Walk: normal Heel Walk: abnormal Sensation: normal Gait: drop-foot   Comments:  Right foot drop since 1991 or 1992.      Specialty Comments:  No specialty comments available.  Imaging: No results found.   PMFS History: Patient Active Problem List   Diagnosis Date Noted  . Flank pain 07/12/2016  . Right-sided chest wall pain 07/12/2016  . Abscess of nasal cavity 06/14/2016  . Right flank mass 06/14/2016  . Tobacco use disorder 06/14/2016  . Overweight (BMI 25.0-29.9) 06/14/2016  . Skin exam, screening for cancer 04/04/2016  . Sacral contusion 12/18/2015  . Sinusitis, chronic 08/12/2015  . Arthralgia 07/06/2015  . Intractable nausea and vomiting 06/10/2015  . Pill-induced gastritis 06/10/2015  . Chronic back pain 06/10/2015  . Multiple duodenal ulcers 06/10/2015  . Nausea & vomiting 05/27/2015  . Rash 05/07/2015  . Hyperlipidemia 05/07/2015  . Migraines 04/21/2015  . 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 Mood change ([place]) 03/19/2015  . Rib pain on right side 02/16/2015  . Essential hypertension 02/16/2015  . Low back pain 02/16/2015   Past Medical  History:  Diagnosis Date  . Arthritis   . GERD (gastroesophageal reflux disease)   . Hyperlipidemia   . Hypertension   . Migraines     Family History  Problem Relation Age of Onset  . Arthritis Mother   . Hyperlipidemia Mother   . Hypertension Father     Past Surgical History:  Procedure Laterality Date  . APPENDECTOMY  1980s   post op bleeding sent him back for ex lap and hemostatic intervention.   Marland Kitchen BACK SURGERY     8 surgeries total - fused from L1-S1  . ESOPHAGOGASTRODUODENOSCOPY N/A 06/10/2015   Procedure: ESOPHAGOGASTRODUODENOSCOPY (EGD);  Surgeon: Lafayette Dragon, MD;  Location: [facility] ENDOSCOPY;  Service: Endoscopy;  Laterality: N/A;  .  ex lap with peri gastric vessel ligation  1990s   after retching, he rupture a blood vessel "outside" the stomach.  vessel was ligated per pt's descriiption.   Marland Kitchen KNEE SURGERY     Multiple bilateral scopes  . SHOULDER SURGERY     4x on the left - total shoulder replacement.    Social History   Occupational History  . Disability     Former Therapist, sports   Social History Main Topics  . Smoking status: Current Some Day Smoker    Types: Cigarettes  . Smokeless tobacco: Never Used  . Alcohol use 0.0 oz/week     Comment: rarely  . Drug use: No  . Sexual activity: Not on file

## 2016-09-19 NOTE — Patient Instructions (Signed)
Avoiding heavier lifting,  No lifitng greater than 10 lbs Consider hernia surgery to relieve your flank pain from.Marland Kitchen

## 2016-09-21 ENCOUNTER — Other Ambulatory Visit: Payer: Self-pay | Admitting: Family

## 2016-09-23 ENCOUNTER — Ambulatory Visit (INDEPENDENT_AMBULATORY_CARE_PROVIDER_SITE_OTHER): Payer: Medicare Other | Admitting: Psychology

## 2016-09-23 DIAGNOSIS — F901 Attention-deficit hyperactivity disorder, predominantly hyperactive type: Secondary | ICD-10-CM | POA: Diagnosis not present

## 2016-09-26 ENCOUNTER — Encounter: Payer: Self-pay | Admitting: Podiatry

## 2016-09-26 ENCOUNTER — Ambulatory Visit (INDEPENDENT_AMBULATORY_CARE_PROVIDER_SITE_OTHER): Payer: Medicare Other | Admitting: Podiatry

## 2016-09-26 DIAGNOSIS — L600 Ingrowing nail: Secondary | ICD-10-CM

## 2016-09-26 NOTE — Progress Notes (Signed)
Subjective:    Patient ID: Sequoyah Clyatt, male    DOB: Aug 30, 1966, 50 y.o.   MRN: [id number]  HPI  50 year old male presents the operative or concerns of ingrown toenails the left big toe which is been ongoing for several years and has been worsening. He states that due to back surgeries and other issues she cannot trim his toenails and is cause an ingrown toenail the left big toe. He is previously removed in 2006 and since then has grown back ingrown again and he is requesting the nail corners removed. Denies any drainage or pus any surrounding redness or drainage. He has noticed some swelling around the nail border. No other complaints.   Review of Systems  Constitutional: Positive for activity change.  Eyes: Positive for pain and visual disturbance.  Musculoskeletal: Positive for back pain.       Joint and muscle pain  Neurological: Positive for weakness and headaches.  All other systems reviewed and are negative.      Objective:   Physical Exam General: AAO x3, NAD  Dermatological: Evidence of incurvation on the left hallux toenail both the medial and lateral nail borders. There is tenderness palpation. There is localized edema without any erythema, ascending cellulitis, fluctuance, crepitus, malodor. There is no other signs of infection. No significant antalgic to that her toenails. No open lesions or pre-ulcerative lesions.  Vascular: Dorsalis Pedis artery and Posterior Tibial artery pedal pulses are 2/4 bilateral with immedate capillary fill time. Pedal hair growth present. No varicosities and no lower extremity edema present bilateral. There is no pain with calf compression, swelling, warmth, erythema.   Neruologic: Grossly intact via light touch bilateral. Vibratory intact via tuning fork bilateral. Protective threshold with Semmes Wienstein monofilament intact to all pedal sites bilateral.  Musculoskeletal: No gross boney pedal deformities bilateral. No pain, crepitus, or  limitation noted with foot and ankle range of motion bilateral. Muscular strength 5/5 in all groups tested bilateral.  Gait: Unassisted, Nonantalgic.     Assessment & Plan:  50 year old male left hallux symptomatically ingrown toenail both the medial and lateral nail borders. -Treatment options discussed including all alternatives, risks, and complications -Etiology of symptoms were discussed -At this time, the patient is requesting partial nail removal with chemical matricectomy to the symptomatic portion of the nail. Risks and complications were discussed with the patient for which they understand and  verbally consent to the procedure. Under sterile conditions a total of 3 mL of a mixture of 2% lidocaine plain and 0.5% Marcaine plain was infiltrated in a hallux block fashion. Once anesthetized, the skin was prepped in sterile fashion. A tourniquet was then applied. Next the medial and lateral aspect of hallux nail border was then sharply excised making sure to remove the entire offending nail border. Once the nails were ensured to be removed area was debrided and the underlying skin was intact. There is no purulence identified in the procedure. Next phenol was then applied under standard conditions and copiously irrigated. Silvadene was applied. A dry sterile dressing was applied. After application of the dressing the tourniquet was removed and there is found to be an immediate capillary refill time to the digit. The patient tolerated the procedure well any complications. Post procedure instructions were discussed the patient for which he verbally understood. Follow-up in one week for nail check or sooner if any problems are to arise. Discussed signs/symptoms of infection and directed to call the office immediately should any occur or go directly to the emergency  room. In the meantime, encouraged to call the office with any questions, concerns, changes symptoms.  Celesta Gentile, DPM

## 2016-09-26 NOTE — Patient Instructions (Signed)

## 2016-09-28 ENCOUNTER — Other Ambulatory Visit: Payer: Self-pay | Admitting: Family

## 2016-09-28 DIAGNOSIS — I10 Essential (primary) hypertension: Secondary | ICD-10-CM

## 2016-09-30 ENCOUNTER — Ambulatory Visit: Payer: Medicare Other | Admitting: Psychology

## 2016-10-06 ENCOUNTER — Encounter: Payer: Self-pay | Admitting: Family

## 2016-10-06 ENCOUNTER — Ambulatory Visit (INDEPENDENT_AMBULATORY_CARE_PROVIDER_SITE_OTHER): Payer: Medicare Other | Admitting: Family

## 2016-10-06 DIAGNOSIS — N4830 Priapism, unspecified: Secondary | ICD-10-CM | POA: Diagnosis not present

## 2016-10-06 NOTE — Assessment & Plan Note (Addendum)
Painful penile erection follow potential episode of injury following intercourse. There are no significant findings upon GU exam. Does not appear to be consistent fracture. Refer to urology for possible cystoscopy and further evaluation. No currently experiencing pain and maintained on pain medication for other conditions.

## 2016-10-06 NOTE — Patient Instructions (Addendum)
Thank you for choosing [facility].  SUMMARY AND INSTRUCTIONS:  Medication:  Your prescription(s) have been submitted to your pharmacy or been printed and provided for you. Please take as directed and contact our office if you believe you are having problem(s) with the medication(s) or have any questions.  Follow up:  They will call to schedule your appointment with urology.   If your symptoms worsen or fail to improve, please contact our office for further instruction, or in case of emergency go directly to the emergency room at the closest medical facility.    Penile Fracture Introduction A penile fracture is a break (rupture) in a layer of tissue that lines the penis when the penis is hardened (erect). The penis becomes erect when certain structures inside the penis (corpora cavernosum) fill with blood. These structures are surrounded by a tight membrane (tunica albuginea). This membrane can rupture during forceful sexual intercourse. The corpora cavernosum may also rupture. In rare cases, the rupture may involve the tube that passes urine through the penis (urethra). If the tunica albuginea ruptures, blood leaks from the corpus cavernosum into the surrounding tissues (hematoma). A rupture of the urethra may cause bleeding through the opening of the urethra. Or, you may not be able to pass urine. Penile fracture is a medical emergency. What are the causes? A penile fracture usually results from forceful sexual intercourse or other causes of abrupt force on an erect penis. What increases the risk? This condition is more likely to develop in men who have:  Any kind of forceful sexual activity.  Intercourse with the sexual partner on top. This position makes it more likely for the penis to slip out. The partner's weight on the erect penis may cause a fracture. What are the signs or symptoms? The first signs of penile fracture include:  A snapping or cracking sound.  Severe  penile pain.  Loss of erection. Within a short period of time, other signs may develop, including:  Swelling of the penis.  Bruising and discoloration of the penis.  A bend in the penis away from the fracture (deformity). How is this diagnosed? Your health care provider can diagnose penile fracture based on your signs and symptoms and the description of the injury. Your health care provider will also do a physical exam. This may include imaging studies, such as:  An ultrasound. This determines the location of the rupture and the extent of the injury.  An X-ray of blood flow in the penis (cavernosography). This checks which blood vessels are damaged.  An X-ray of the urethra (urethrogram). This checks whether the urethra is ruptured.  An MRI. This gets more detailed information about the extent of tissue damage involved in the fracture. How is this treated? Penile fracture is a serious injury that requires emergency surgery to:  Remove hematomas.  Repair ruptured tissue and membranes.  Repair the urethra, if necessary. This information is not intended to replace advice given to you by your health care provider. Make sure you discuss any questions you have with your health care provider. Document Released: 09/20/2004 Document Revised: 04/14/2016 Document Reviewed: 09/11/2014  2017 Elsevier

## 2016-10-06 NOTE — Progress Notes (Signed)
Subjective:    Patient ID: Jeremiah Anderson, male    DOB: 1966-11-16, 50 y.o.   MRN: [id number]  Chief Complaint  Patient presents with  . Referral    referral to urology    HPI:  Jeremiah Anderson is a 50 y.o. male who  has a past medical history of Arthritis; GERD (gastroesophageal reflux disease); Hyperlipidemia; Hypertension; and Migraines. and presents today for an office visit.   This is a new problem. Associated symptom of pain with an erection happened about 1 month ago. Most recently he experience pain located near the head of the penis and describes that it seemed like the distal aspect of penis was more flaccid then the rest. Aggravated with erections. Urinating well. Describes there was an episode of intercourse that he may have had brief pain. There is no pain when flaccid. He has concern about blood flow to the penis. Urinating well with no blood, dysuria, flank pain, fevers or penile discharge.   Allergies  Allergen Reactions  . Fentanyl     Hallucinations   . Lisinopril Swelling  . Lyrica [Pregabalin]     hallucinations  . Sulfa Antibiotics Swelling      Outpatient Medications Prior to Visit  Medication Sig Dispense Refill  . amLODipine (NORVASC) 10 MG tablet TAKE ONE TABLET BY MOUTH ONCE DAILY 90 tablet 0  . butalbital-acetaminophen-caffeine (FIORICET, ESGIC) 50-325-40 MG tablet TAKE ONE TABLET BY MOUTH THREE TIMES A DAY AS NEEDED 90 tablet 0  . fluticasone (FLONASE) 50 MCG/ACT nasal spray Place 2 sprays into both nostrils daily. 16 g 6  . methocarbamol (ROBAXIN) 750 MG tablet Take 750 mg by mouth 4 (four) times daily.    Marland Kitchen morphine (MSIR) 30 MG tablet Take 30 mg by mouth every 3 (three) hours.     . ondansetron (ZOFRAN) 4 MG tablet TAKE ONE TABLET BY MOUTH EVERY 8 HOURS AS NEEDED FOR NAUSEA OR  VOMITING 20 tablet 0  . citalopram (CELEXA) 20 MG tablet TAKE ONE TABLET BY MOUTH ONCE DAILY (Patient not taking: Reported on 09/19/2016) 30 tablet 5  . mupirocin ointment  (BACTROBAN) 2 % Place 1 application into the nose 2 (two) times daily. (Patient not taking: Reported on 09/19/2016) 22 g 0  . sildenafil (VIAGRA) 50 MG tablet Take 1 tablet (50 mg total) by mouth daily as needed for erectile dysfunction. (Patient not taking: Reported on 09/19/2016) 10 tablet 0   No facility-administered medications prior to visit.     Review of Systems  Constitutional: Negative for chills and fever.  Genitourinary: Negative for dysuria, flank pain, frequency, hematuria, penile pain, penile swelling and urgency.      Objective:    BP (!) 142/94 (BP Location: Left Arm, Patient Position: Sitting, Cuff Size: Normal)   Pulse 84   Temp 98.4 F (36.9 C) (Oral)   Resp 16   Ht 5\' 11"  (1.803 m)   Wt 183 lb (83 kg)   SpO2 98%   BMI 25.52 kg/m  Nursing note and vital signs reviewed.  Physical Exam  Constitutional: He is oriented to person, place, and time. He appears well-developed and well-nourished. No distress.  Cardiovascular: Normal rate, regular rhythm, normal heart sounds and intact distal pulses.   Pulmonary/Chest: Effort normal and breath sounds normal.  Genitourinary: Penis normal. Circumcised. No phimosis, paraphimosis, hypospadias, penile erythema or penile tenderness. No discharge found.  Neurological: He is alert and oriented to person, place, and time.  Skin: Skin is warm and dry.  Psychiatric: He  has a normal mood and affect. His behavior is normal. Judgment and thought content normal.       Assessment & Plan:   Problem List Items Addressed This Visit      Other   Painful penile erection    Painful penile erection follow potential episode of injury following intercourse. There are no significant findings upon GU exam. Refer to urology for possible cystoscopy and further evaluation. No currently experiencing pain and maintained on pain medication for other conditions.       Relevant Orders   Ambulatory referral to Urology      I have discontinued  Mr. Nick sildenafil, mupirocin ointment, and citalopram. I am also having him maintain his morphine, methocarbamol, fluticasone, butalbital-acetaminophen-caffeine, ondansetron, and amLODipine.   Follow-up: Return if symptoms worsen or fail to improve.  [name], FNP

## 2016-10-07 ENCOUNTER — Ambulatory Visit (INDEPENDENT_AMBULATORY_CARE_PROVIDER_SITE_OTHER): Payer: Medicare Other | Admitting: Psychology

## 2016-10-07 DIAGNOSIS — F901 Attention-deficit hyperactivity disorder, predominantly hyperactive type: Secondary | ICD-10-CM | POA: Diagnosis not present

## 2016-10-10 ENCOUNTER — Ambulatory Visit (INDEPENDENT_AMBULATORY_CARE_PROVIDER_SITE_OTHER): Payer: Medicare Other | Admitting: Podiatry

## 2016-10-10 ENCOUNTER — Other Ambulatory Visit: Payer: Self-pay | Admitting: Family

## 2016-10-10 ENCOUNTER — Encounter: Payer: Self-pay | Admitting: Podiatry

## 2016-10-10 DIAGNOSIS — L600 Ingrowing nail: Secondary | ICD-10-CM | POA: Diagnosis not present

## 2016-10-10 DIAGNOSIS — Z9889 Other specified postprocedural states: Secondary | ICD-10-CM

## 2016-10-10 NOTE — Telephone Encounter (Signed)
Last refill was 09/13/16

## 2016-10-10 NOTE — Patient Instructions (Signed)

## 2016-10-11 NOTE — Progress Notes (Signed)
Subjective: Jeremiah Anderson is a 50 y.o.  male returns to office today for follow up evaluation after having left Hallux medial and lateral nail avulsion performed. Patient has been soaking using epsom satls and applying topical antibiotic covered with bandaid but not regularly. Patient denies fevers, chills, nausea, vomiting. Denies any calf pain, chest pain, SOB.   Objective:  Vitals: Reviewed  General: Well developed, nourished, in no acute distress, alert and oriented x3   Dermatology: Skin is warm, dry and supple bilateral. Medial and lateral hallux nail border appears to be clean, dry, with mild granular tissue and surrounding scab. There is no surrounding erythema, edema, drainage/purulence. The remaining nails appear unremarkable at this time. There are no other lesions or other signs of infection present.  Neurovascular status: Intact. No lower extremity swelling; No pain with calf compression bilateral.  Musculoskeletal: Decreased tenderness to palpation of the left hallux nail folds. Muscular strength within normal limits bilateral.   Assesement and Plan: S/p partial nail avulsion, doing well.   -Continue soaking in epsom salts twice a day followed by antibiotic ointment and a band-aid. Can leave uncovered at night. Continue this until completely healed.  -If the area has not healed in 2 weeks, call the office for follow-up appointment, or sooner if any problems arise.  -Monitor for any signs/symptoms of infection. Call the office immediately if any occur or go directly to the emergency room. Call with any questions/concerns.  Celesta Gentile, DPM

## 2016-10-11 NOTE — Telephone Encounter (Signed)
Rx sent 

## 2016-10-12 ENCOUNTER — Other Ambulatory Visit: Payer: Self-pay | Admitting: Family

## 2016-10-14 ENCOUNTER — Ambulatory Visit (INDEPENDENT_AMBULATORY_CARE_PROVIDER_SITE_OTHER): Payer: Medicare Other | Admitting: Psychology

## 2016-10-14 DIAGNOSIS — F901 Attention-deficit hyperactivity disorder, predominantly hyperactive type: Secondary | ICD-10-CM | POA: Diagnosis not present

## 2016-10-18 ENCOUNTER — Encounter (HOSPITAL_COMMUNITY)
Admission: RE | Admit: 2016-10-18 | Discharge: 2016-10-18 | Disposition: A | Discharge status: Home / Self Care | Payer: Medicare Other | Source: Ambulatory Visit | Attending: General Surgery | Admitting: General Surgery

## 2016-10-18 ENCOUNTER — Other Ambulatory Visit: Payer: Self-pay | Admitting: Family

## 2016-10-18 ENCOUNTER — Encounter (HOSPITAL_COMMUNITY): Payer: Self-pay

## 2016-10-18 DIAGNOSIS — M549 Dorsalgia, unspecified: Secondary | ICD-10-CM | POA: Diagnosis not present

## 2016-10-18 DIAGNOSIS — G8929 Other chronic pain: Secondary | ICD-10-CM | POA: Diagnosis not present

## 2016-10-18 DIAGNOSIS — I10 Essential (primary) hypertension: Secondary | ICD-10-CM

## 2016-10-18 DIAGNOSIS — Z01818 Encounter for other preprocedural examination: Secondary | ICD-10-CM | POA: Insufficient documentation

## 2016-10-18 DIAGNOSIS — K432 Incisional hernia without obstruction or gangrene: Secondary | ICD-10-CM | POA: Diagnosis not present

## 2016-10-18 DIAGNOSIS — K219 Gastro-esophageal reflux disease without esophagitis: Secondary | ICD-10-CM | POA: Diagnosis not present

## 2016-10-18 DIAGNOSIS — M1990 Unspecified osteoarthritis, unspecified site: Secondary | ICD-10-CM | POA: Diagnosis not present

## 2016-10-18 HISTORY — DX: Presence of spectacles and contact lenses: Z97.3

## 2016-10-18 LAB — CBC
HEMATOCRIT: 35.5 % — AB (ref 39.0–52.0)
Hemoglobin: 12.8 g/dL — ABNORMAL LOW (ref 13.0–17.0)
MCH: 30.1 pg (ref 26.0–34.0)
MCHC: 36.1 g/dL — AB (ref 30.0–36.0)
MCV: 83.5 fL (ref 78.0–100.0)
PLATELETS: 275 10*3/uL (ref 150–400)
RBC: 4.25 MIL/uL (ref 4.22–5.81)
RDW: 12.5 % (ref 11.5–15.5)
WBC: 8.5 10*3/uL (ref 4.0–10.5)

## 2016-10-18 LAB — BASIC METABOLIC PANEL
Anion gap: 8 (ref 5–15)
BUN: 9 mg/dL (ref 6–20)
CHLORIDE: 99 mmol/L — AB (ref 101–111)
CO2: 24 mmol/L (ref 22–32)
CREATININE: 0.91 mg/dL (ref 0.61–1.24)
Calcium: 9.4 mg/dL (ref 8.9–10.3)
GFR calc Af Amer: >60 mL/min (ref >60)
GFR calc non Af Amer: >60 mL/min (ref >60)
GLUCOSE: 98 mg/dL (ref 65–99)
POTASSIUM: 3.9 mmol/L (ref 3.5–5.1)
SODIUM: 131 mmol/L — AB (ref 135–145)

## 2016-10-18 NOTE — Pre-Procedure Instructions (Addendum)
   Jeremiah Anderson  10/18/2016        Your procedure is scheduled on Friday, December 1st, 2017.  Report to [facility] Admitting at 5:30 A.M.   Call this number if you have problems the morning of surgery:  [phone]   Remember:  Do not eat food or drink liquids after midnight.   Take these medicines the morning of surgery with A SIP OF WATER: Amlodipine (Norvasc), Flonase nasal spray if needed, Methocarbamol (Robaxin) if needed, Morphine (MSIR), Zofran if needed, Fioricet if needed.  Stop taking: Aspirin, NSAIDS, Aleve, Naproxen, Ibuprofen, Advil, Motrin, BC's, Goody's, Fish oil, all herbal medications, and all vitamins.    Do not wear jewelry.  Do not wear lotions, powders, or colognes, or deoderant.  Men may shave face and neck.  Do not bring valuables to the hospital.  [facility] is not responsible for any belongings or valuables.  Contacts, dentures or bridgework may not be worn into surgery.  Leave your suitcase in the car.  After surgery it may be brought to your room.  For patients admitted to the hospital, discharge time will be determined by your treatment team.  Patients discharged the day of surgery will not be allowed to drive home.   Special instructions:  Preparing for Surgery.   [facility]- Preparing For Surgery  Before surgery, you can play an important role. Because skin is not sterile, your skin needs to be as free of germs as possible. You can reduce the number of germs on your skin by washing with CHG (chlorahexidine gluconate) Soap before surgery.  CHG is an antiseptic cleaner which kills germs and bonds with the skin to continue killing germs even after washing.  Please do not use if you have an allergy to CHG or antibacterial soaps. If your skin becomes reddened/irritated stop using the CHG.  Do not shave (including legs and underarms) for at least 48 hours prior to first CHG shower. It is OK to shave your face.  Please follow these  instructions carefully.   1. Shower the NIGHT BEFORE SURGERY and the MORNING OF SURGERY with CHG.   2. If you chose to wash your hair, wash your hair first as usual with your normal shampoo.  3. After you shampoo, rinse your hair and body thoroughly to remove the shampoo.  4. Use CHG as you would any other liquid soap. You can apply CHG directly to the skin and wash gently with a scrungie or a clean washcloth.   5. Apply the CHG Soap to your body ONLY FROM THE NECK DOWN.  Do not use on open wounds or open sores. Avoid contact with your eyes, ears, mouth and genitals (private parts). Wash genitals (private parts) with your normal soap.  6. Wash thoroughly, paying special attention to the area where your surgery will be performed.  7. Thoroughly rinse your body with warm water from the neck down.  8. DO NOT shower/wash with your normal soap after using and rinsing off the CHG Soap.  9. Pat yourself dry with a CLEAN TOWEL.   10. Wear CLEAN PAJAMAS   11. Place CLEAN SHEETS on your bed the night of your first shower and DO NOT SLEEP WITH PETS.  Day of Surgery: Do not apply any deodorants/lotions. Please wear clean clothes to the hospital/surgery center.    Please read over the following fact sheets that you were given. Coughing and Deep Breathing.

## 2016-10-18 NOTE — Progress Notes (Signed)
PCP - Gaetano Hawthorne, FNP Cardiologist - denies  EKG - 10/18/16 CXR - denies  Echo/stress test/cardiac cath - patient denies  Patient denies chest pain and shortness of breath at PAT appointment.

## 2016-10-19 DIAGNOSIS — M961 Postlaminectomy syndrome, not elsewhere classified: Secondary | ICD-10-CM | POA: Diagnosis not present

## 2016-10-19 DIAGNOSIS — Z79891 Long term (current) use of opiate analgesic: Secondary | ICD-10-CM | POA: Diagnosis not present

## 2016-10-19 DIAGNOSIS — K432 Incisional hernia without obstruction or gangrene: Secondary | ICD-10-CM | POA: Diagnosis not present

## 2016-10-19 DIAGNOSIS — G894 Chronic pain syndrome: Secondary | ICD-10-CM | POA: Diagnosis not present

## 2016-10-20 NOTE — Anesthesia Preprocedure Evaluation (Addendum)
Anesthesia Evaluation  Patient identified by MRN, date of birth, ID band Patient awake    Reviewed: Allergy & Precautions, NPO status , Patient's Chart, lab work & pertinent test results  Airway Mallampati: II  TM Distance: >3 FB Neck ROM: Full    Dental  (+) Teeth Intact, Dental Advisory Given   Pulmonary Current Smoker,    breath sounds clear to auscultation       Cardiovascular hypertension, Pt. on medications  Rhythm:Regular Rate:Normal     Neuro/Psych  Headaches, negative psych ROS   GI/Hepatic Neg liver ROS, GERD  Medicated and Controlled,  Endo/Other  negative endocrine ROS  Renal/GU negative Renal ROS  negative genitourinary   Musculoskeletal  (+) Arthritis , Osteoarthritis,    Abdominal   Peds negative pediatric ROS (+)  Hematology negative hematology ROS (+)   Anesthesia Other Findings   Reproductive/Obstetrics negative OB ROS                           Anesthesia Physical Anesthesia Plan  ASA: II  Anesthesia Plan: General   Post-op Pain Management:    Induction: Intravenous  Airway Management Planned: Oral ETT  Additional Equipment:   Intra-op Plan:   Post-operative Plan: Extubation in OR  Informed Consent: I have reviewed the patients History and Physical, chart, labs and discussed the procedure including the risks, benefits and alternatives for the proposed anesthesia with the patient or authorized representative who has indicated his/her understanding and acceptance.   Dental advisory given  Plan Discussed with:   Anesthesia Plan Comments:         Anesthesia Quick Evaluation

## 2016-10-21 ENCOUNTER — Inpatient Hospital Stay (HOSPITAL_COMMUNITY): Payer: Medicare Other | Admitting: Anesthesiology

## 2016-10-21 ENCOUNTER — Encounter (HOSPITAL_COMMUNITY): Payer: Self-pay | Admitting: *Deleted

## 2016-10-21 ENCOUNTER — Observation Stay (HOSPITAL_COMMUNITY)
Admission: RE | Admit: 2016-10-21 | Discharge: 2016-10-23 | Disposition: A | Discharge status: Home / Self Care | Payer: Medicare Other | Source: Ambulatory Visit | Attending: General Surgery | Admitting: General Surgery

## 2016-10-21 ENCOUNTER — Encounter (HOSPITAL_COMMUNITY)
Admission: RE | Disposition: A | Discharge status: Home / Self Care | Payer: Self-pay | Source: Ambulatory Visit | Attending: General Surgery

## 2016-10-21 DIAGNOSIS — M1990 Unspecified osteoarthritis, unspecified site: Secondary | ICD-10-CM | POA: Insufficient documentation

## 2016-10-21 DIAGNOSIS — G8929 Other chronic pain: Secondary | ICD-10-CM | POA: Insufficient documentation

## 2016-10-21 DIAGNOSIS — K219 Gastro-esophageal reflux disease without esophagitis: Secondary | ICD-10-CM | POA: Diagnosis not present

## 2016-10-21 DIAGNOSIS — E785 Hyperlipidemia, unspecified: Secondary | ICD-10-CM | POA: Diagnosis not present

## 2016-10-21 DIAGNOSIS — J329 Chronic sinusitis, unspecified: Secondary | ICD-10-CM | POA: Diagnosis not present

## 2016-10-21 DIAGNOSIS — M549 Dorsalgia, unspecified: Secondary | ICD-10-CM | POA: Diagnosis not present

## 2016-10-21 DIAGNOSIS — Z9889 Other specified postprocedural states: Secondary | ICD-10-CM

## 2016-10-21 DIAGNOSIS — K432 Incisional hernia without obstruction or gangrene: Principal | ICD-10-CM | POA: Insufficient documentation

## 2016-10-21 DIAGNOSIS — Z8719 Personal history of other diseases of the digestive system: Secondary | ICD-10-CM

## 2016-10-21 DIAGNOSIS — I10 Essential (primary) hypertension: Secondary | ICD-10-CM | POA: Insufficient documentation

## 2016-10-21 HISTORY — PX: INSERTION OF MESH: SHX5868

## 2016-10-21 HISTORY — PX: INCISIONAL HERNIA REPAIR: SHX193

## 2016-10-21 SURGERY — REPAIR, HERNIA, INCISIONAL
Anesthesia: General | Site: Flank | Laterality: Right

## 2016-10-21 MED ORDER — SUGAMMADEX SODIUM 200 MG/2ML IV SOLN
INTRAVENOUS | Status: AC
  Filled 2016-10-21: qty 2

## 2016-10-21 MED ORDER — FENTANYL CITRATE (PF) 100 MCG/2ML IJ SOLN
INTRAMUSCULAR | Status: AC
  Filled 2016-10-21: qty 2

## 2016-10-21 MED ORDER — EPHEDRINE SULFATE-NACL 50-0.9 MG/10ML-% IV SOSY
PREFILLED_SYRINGE | INTRAVENOUS | Status: DC | PRN
  Administered 2016-10-21 (×2): 2.5 mg via INTRAVENOUS
  Administered 2016-10-21: 5 mg via INTRAVENOUS
  Administered 2016-10-21: 10 mg via INTRAVENOUS

## 2016-10-21 MED ORDER — ACETAMINOPHEN 10 MG/ML IV SOLN
INTRAVENOUS | Status: DC | PRN
Start: 2016-10-21 — End: 2016-10-21
  Administered 2016-10-21: 1000 mg via INTRAVENOUS

## 2016-10-21 MED ORDER — SODIUM CHLORIDE 0.9% FLUSH: 9.0000 mL | INTRAVENOUS | Status: DC | PRN

## 2016-10-21 MED ORDER — ONDANSETRON HCL 4 MG/2ML IJ SOLN
INTRAMUSCULAR | Status: DC | PRN
  Administered 2016-10-21: 4 mg via INTRAVENOUS

## 2016-10-21 MED ORDER — EPHEDRINE 5 MG/ML INJ
INTRAVENOUS | Status: AC
  Filled 2016-10-21: qty 10

## 2016-10-21 MED ORDER — BUTALBITAL-APAP-CAFFEINE 50-325-40 MG PO TABS
1.0000 | ORAL_TABLET | Freq: Three times a day (TID) | ORAL | Status: DC | PRN
  Administered 2016-10-21 – 2016-10-23 (×7): 1 via ORAL
  Filled 2016-10-21 (×6): qty 1

## 2016-10-21 MED ORDER — HYDROMORPHONE 1 MG/ML IV SOLN
INTRAVENOUS | Status: AC
  Filled 2016-10-21: qty 25

## 2016-10-21 MED ORDER — DEXAMETHASONE SODIUM PHOSPHATE 10 MG/ML IJ SOLN
INTRAMUSCULAR | Status: DC | PRN
  Administered 2016-10-21: 10 mg via INTRAVENOUS

## 2016-10-21 MED ORDER — ADULT MULTIVITAMIN W/MINERALS CH
1.0000 | ORAL_TABLET | Freq: Every day | ORAL | Status: DC
  Administered 2016-10-21 – 2016-10-23 (×3): 1 via ORAL
  Filled 2016-10-21 (×3): qty 1

## 2016-10-21 MED ORDER — PROPOFOL 10 MG/ML IV BOLUS
INTRAVENOUS | Status: AC
  Filled 2016-10-21: qty 40

## 2016-10-21 MED ORDER — CHLORHEXIDINE GLUCONATE CLOTH 2 % EX PADS: 6.0000 | MEDICATED_PAD | Freq: Once | CUTANEOUS | Status: DC

## 2016-10-21 MED ORDER — MIDAZOLAM HCL 2 MG/2ML IJ SOLN
INTRAMUSCULAR | Status: AC
  Filled 2016-10-21: qty 2

## 2016-10-21 MED ORDER — INFLUENZA VAC SPLIT QUAD 0.5 ML IM SUSY
0.5000 mL | PREFILLED_SYRINGE | INTRAMUSCULAR | Status: DC
  Filled 2016-10-21: qty 0.5

## 2016-10-21 MED ORDER — PROPOFOL 10 MG/ML IV BOLUS
INTRAVENOUS | Status: DC | PRN
  Administered 2016-10-21: 200 mg via INTRAVENOUS

## 2016-10-21 MED ORDER — LIDOCAINE 2% (20 MG/ML) 5 ML SYRINGE
INTRAMUSCULAR | Status: AC
  Filled 2016-10-21: qty 5

## 2016-10-21 MED ORDER — ENOXAPARIN SODIUM 40 MG/0.4ML ~~LOC~~ SOLN
40.0000 mg | SUBCUTANEOUS | Status: DC
  Administered 2016-10-22 – 2016-10-23 (×2): 40 mg via SUBCUTANEOUS
  Filled 2016-10-21 (×2): qty 0.4

## 2016-10-21 MED ORDER — 0.9 % SODIUM CHLORIDE (POUR BTL) OPTIME
TOPICAL | Status: DC | PRN
  Administered 2016-10-21: 1000 mL

## 2016-10-21 MED ORDER — HYDROMORPHONE HCL 1 MG/ML IJ SOLN
INTRAMUSCULAR | Status: AC
  Filled 2016-10-21: qty 0.5

## 2016-10-21 MED ORDER — HYDROMORPHONE HCL 1 MG/ML IJ SOLN
0.2500 mg | INTRAMUSCULAR | Status: DC | PRN
  Administered 2016-10-21 (×4): 0.5 mg via INTRAVENOUS

## 2016-10-21 MED ORDER — KETAMINE HCL 10 MG/ML IJ SOLN
INTRAMUSCULAR | Status: DC | PRN
  Administered 2016-10-21: 40 mg via INTRAVENOUS
  Administered 2016-10-21 (×3): 20 mg via INTRAVENOUS

## 2016-10-21 MED ORDER — ONDANSETRON HCL 4 MG/2ML IJ SOLN
INTRAMUSCULAR | Status: AC
  Filled 2016-10-21: qty 2

## 2016-10-21 MED ORDER — FENTANYL CITRATE (PF) 100 MCG/2ML IJ SOLN
INTRAMUSCULAR | Status: DC | PRN
  Administered 2016-10-21 (×3): 100 ug via INTRAVENOUS

## 2016-10-21 MED ORDER — PHENYLEPHRINE 40 MCG/ML (10ML) SYRINGE FOR IV PUSH (FOR BLOOD PRESSURE SUPPORT)
PREFILLED_SYRINGE | INTRAVENOUS | Status: AC
  Filled 2016-10-21: qty 10

## 2016-10-21 MED ORDER — CEFAZOLIN SODIUM-DEXTROSE 2-4 GM/100ML-% IV SOLN: 2.0000 g | INTRAVENOUS | Status: DC

## 2016-10-21 MED ORDER — HYDROMORPHONE 1 MG/ML IV SOLN
INTRAVENOUS | Status: DC
  Administered 2016-10-21: 0.3 mg via INTRAVENOUS
  Administered 2016-10-21: 4.2 mg via INTRAVENOUS
  Administered 2016-10-22: 3.3 mg via INTRAVENOUS
  Administered 2016-10-22: 1 mg via INTRAVENOUS

## 2016-10-21 MED ORDER — ROCURONIUM BROMIDE 10 MG/ML (PF) SYRINGE
PREFILLED_SYRINGE | INTRAVENOUS | Status: AC
  Filled 2016-10-21: qty 10

## 2016-10-21 MED ORDER — KETOROLAC TROMETHAMINE 30 MG/ML IJ SOLN
INTRAMUSCULAR | Status: AC
  Filled 2016-10-21: qty 1

## 2016-10-21 MED ORDER — EVICEL 5 ML EX KIT
PACK | CUTANEOUS | Status: DC | PRN
  Administered 2016-10-21: 5 mL

## 2016-10-21 MED ORDER — LIDOCAINE 4 % EX PTCH: 1.0000 | MEDICATED_PATCH | Freq: Two times a day (BID) | CUTANEOUS | Status: DC | PRN

## 2016-10-21 MED ORDER — DIPHENHYDRAMINE HCL 50 MG/ML IJ SOLN: 12.5000 mg | Freq: Four times a day (QID) | INTRAMUSCULAR | Status: DC | PRN

## 2016-10-21 MED ORDER — AMLODIPINE BESYLATE 10 MG PO TABS
10.0000 mg | ORAL_TABLET | Freq: Every day | ORAL | Status: DC
  Administered 2016-10-22 – 2016-10-23 (×2): 10 mg via ORAL
  Filled 2016-10-21 (×2): qty 1

## 2016-10-21 MED ORDER — ONDANSETRON HCL 4 MG/2ML IJ SOLN: 4.0000 mg | Freq: Four times a day (QID) | INTRAMUSCULAR | Status: DC | PRN

## 2016-10-21 MED ORDER — MIDAZOLAM HCL 2 MG/2ML IJ SOLN
INTRAMUSCULAR | Status: DC | PRN
  Administered 2016-10-21: 2 mg via INTRAVENOUS

## 2016-10-21 MED ORDER — FENTANYL CITRATE (PF) 100 MCG/2ML IJ SOLN
INTRAMUSCULAR | Status: AC
  Filled 2016-10-21: qty 4

## 2016-10-21 MED ORDER — DEXAMETHASONE SODIUM PHOSPHATE 10 MG/ML IJ SOLN
INTRAMUSCULAR | Status: AC
  Filled 2016-10-21: qty 1

## 2016-10-21 MED ORDER — KETOROLAC TROMETHAMINE 30 MG/ML IJ SOLN
INTRAMUSCULAR | Status: DC | PRN
  Administered 2016-10-21: 30 mg via INTRAVENOUS

## 2016-10-21 MED ORDER — FLUTICASONE PROPIONATE 50 MCG/ACT NA SUSP
2.0000 | Freq: Every day | NASAL | Status: DC | PRN
  Filled 2016-10-21: qty 16

## 2016-10-21 MED ORDER — EVICEL 5 ML EX KIT
PACK | CUTANEOUS | Status: AC
  Filled 2016-10-21: qty 2

## 2016-10-21 MED ORDER — METHOCARBAMOL 750 MG PO TABS
750.0000 mg | ORAL_TABLET | Freq: Four times a day (QID) | ORAL | Status: DC | PRN
  Administered 2016-10-22: 750 mg via ORAL
  Filled 2016-10-21 (×2): qty 1

## 2016-10-21 MED ORDER — DIPHENHYDRAMINE HCL 12.5 MG/5ML PO ELIX: 12.5000 mg | ORAL_SOLUTION | Freq: Four times a day (QID) | ORAL | Status: DC | PRN

## 2016-10-21 MED ORDER — MORPHINE SULFATE 15 MG PO TABS
30.0000 mg | ORAL_TABLET | ORAL | Status: DC
  Administered 2016-10-21 – 2016-10-23 (×18): 30 mg via ORAL
  Filled 2016-10-21 (×18): qty 2

## 2016-10-21 MED ORDER — COQ10 100 MG PO CAPS: 100.0000 mg | ORAL_CAPSULE | Freq: Every day | ORAL | Status: DC

## 2016-10-21 MED ORDER — KETAMINE HCL-SODIUM CHLORIDE 100-0.9 MG/10ML-% IV SOSY
PREFILLED_SYRINGE | INTRAVENOUS | Status: AC
  Filled 2016-10-21: qty 10

## 2016-10-21 MED ORDER — DEXTROSE-NACL 5-0.9 % IV SOLN
INTRAVENOUS | Status: DC
  Administered 2016-10-21 – 2016-10-23 (×5): via INTRAVENOUS

## 2016-10-21 MED ORDER — CEFAZOLIN SODIUM-DEXTROSE 2-4 GM/100ML-% IV SOLN
2.0000 g | INTRAVENOUS | Status: AC
  Administered 2016-10-21: 2 g via INTRAVENOUS
  Filled 2016-10-21: qty 100

## 2016-10-21 MED ORDER — SUCCINYLCHOLINE CHLORIDE 200 MG/10ML IV SOSY
PREFILLED_SYRINGE | INTRAVENOUS | Status: AC
  Filled 2016-10-21: qty 10

## 2016-10-21 MED ORDER — HYDROMORPHONE HCL 2 MG/ML IJ SOLN
1.0000 mg | INTRAMUSCULAR | Status: DC | PRN
  Administered 2016-10-22: 2 mg via INTRAVENOUS
  Administered 2016-10-22: 1 mg via INTRAVENOUS
  Administered 2016-10-22 – 2016-10-23 (×5): 2 mg via INTRAVENOUS
  Filled 2016-10-21 (×7): qty 1

## 2016-10-21 MED ORDER — ROCURONIUM BROMIDE 100 MG/10ML IV SOLN
INTRAVENOUS | Status: DC | PRN
  Administered 2016-10-21 (×2): 10 mg via INTRAVENOUS
  Administered 2016-10-21: 30 mg via INTRAVENOUS
  Administered 2016-10-21: 40 mg via INTRAVENOUS
  Administered 2016-10-21: 50 mg via INTRAVENOUS

## 2016-10-21 MED ORDER — SODIUM CHLORIDE 0.9 % IV SOLN
INTRAVENOUS | Status: DC | PRN
  Administered 2016-10-21: 40 mL

## 2016-10-21 MED ORDER — BUPIVACAINE LIPOSOME 1.3 % IJ SUSP
20.0000 mL | INTRAMUSCULAR | Status: DC
  Filled 2016-10-21: qty 20

## 2016-10-21 MED ORDER — LACTATED RINGERS IV SOLN
INTRAVENOUS | Status: DC | PRN
  Administered 2016-10-21 (×2): via INTRAVENOUS

## 2016-10-21 MED ORDER — SUGAMMADEX SODIUM 200 MG/2ML IV SOLN
INTRAVENOUS | Status: DC | PRN
  Administered 2016-10-21: 165 mg via INTRAVENOUS

## 2016-10-21 MED ORDER — ONDANSETRON 4 MG PO TBDP: 4.0000 mg | ORAL_TABLET | Freq: Four times a day (QID) | ORAL | Status: DC | PRN

## 2016-10-21 MED ORDER — PNEUMOCOCCAL VAC POLYVALENT 25 MCG/0.5ML IJ INJ
0.5000 mL | INJECTION | INTRAMUSCULAR | Status: DC
  Filled 2016-10-21: qty 0.5

## 2016-10-21 MED ORDER — LIDOCAINE 5 % EX PTCH: 1.0000 | MEDICATED_PATCH | Freq: Every day | CUTANEOUS | Status: DC | PRN

## 2016-10-21 MED ORDER — LIDOCAINE 2% (20 MG/ML) 5 ML SYRINGE
INTRAMUSCULAR | Status: DC | PRN
  Administered 2016-10-21: 60 mg via INTRAVENOUS

## 2016-10-21 MED ORDER — ACETAMINOPHEN 10 MG/ML IV SOLN
INTRAVENOUS | Status: AC
  Filled 2016-10-21: qty 100

## 2016-10-21 MED ORDER — NALOXONE HCL 0.4 MG/ML IJ SOLN: 0.4000 mg | INTRAMUSCULAR | Status: DC | PRN

## 2016-10-21 SURGICAL SUPPLY — 51 items
BENZOIN TINCTURE PRP APPL 2/3 (GAUZE/BANDAGES/DRESSINGS) ×3 IMPLANT
BLADE SURG ROTATE 9660 (MISCELLANEOUS) IMPLANT
CLOSURE STERI-STRIP 1/2X4 (GAUZE/BANDAGES/DRESSINGS) ×1
CLOSURE WOUND 1/2 X4 (GAUZE/BANDAGES/DRESSINGS) ×1
CLSR STERI-STRIP ANTIMIC 1/2X4 (GAUZE/BANDAGES/DRESSINGS) ×2 IMPLANT
COVER SURGICAL LIGHT HANDLE (MISCELLANEOUS) ×3 IMPLANT
DRAIN CHANNEL 19F RND (DRAIN) IMPLANT
DRAPE INCISE IOBAN 66X45 STRL (DRAPES) IMPLANT
DRAPE LAPAROSCOPIC ABDOMINAL (DRAPES) ×3 IMPLANT
ELECT CAUTERY BLADE 6.4 (BLADE) ×3 IMPLANT
ELECT REM PT RETURN 9FT ADLT (ELECTROSURGICAL) ×3
ELECTRODE REM PT RTRN 9FT ADLT (ELECTROSURGICAL) ×1 IMPLANT
EVACUATOR SILICONE 100CC (DRAIN) IMPLANT
GAUZE SPONGE 4X4 12PLY STRL (GAUZE/BANDAGES/DRESSINGS) ×3 IMPLANT
GAUZE SPONGE 4X4 16PLY XRAY LF (GAUZE/BANDAGES/DRESSINGS) ×3 IMPLANT
GLOVE BIO SURGEON STRL SZ7 (GLOVE) ×6 IMPLANT
GLOVE BIO SURGEON STRL SZ7.5 (GLOVE) ×6 IMPLANT
GLOVE BIOGEL PI IND STRL 7.5 (GLOVE) ×1 IMPLANT
GLOVE BIOGEL PI IND STRL 8 (GLOVE) ×2 IMPLANT
GLOVE BIOGEL PI INDICATOR 7.5 (GLOVE) ×2
GLOVE BIOGEL PI INDICATOR 8 (GLOVE) ×4
GLOVE SURG SS PI 8.0 STRL IVOR (GLOVE) ×3 IMPLANT
GOWN STRL REUS W/ TWL LRG LVL3 (GOWN DISPOSABLE) ×1 IMPLANT
GOWN STRL REUS W/ TWL XL LVL3 (GOWN DISPOSABLE) ×1 IMPLANT
GOWN STRL REUS W/TWL LRG LVL3 (GOWN DISPOSABLE) ×2
GOWN STRL REUS W/TWL XL LVL3 (GOWN DISPOSABLE) ×2
KIT BASIN OR (CUSTOM PROCEDURE TRAY) ×3 IMPLANT
KIT ROOM TURNOVER OR (KITS) ×3 IMPLANT
MARKER SKIN DUAL TIP RULER LAB (MISCELLANEOUS) ×3 IMPLANT
MESH PROLENE PML 12X12 (Mesh General) ×3 IMPLANT
NEEDLE HYPO 25GX1X1/2 BEV (NEEDLE) ×3 IMPLANT
NS IRRIG 1000ML POUR BTL (IV SOLUTION) ×3 IMPLANT
PACK GENERAL/GYN (CUSTOM PROCEDURE TRAY) ×3 IMPLANT
PAD ARMBOARD 7.5X6 YLW CONV (MISCELLANEOUS) ×6 IMPLANT
STAPLER VISISTAT 35W (STAPLE) IMPLANT
STRIP CLOSURE SKIN 1/2X4 (GAUZE/BANDAGES/DRESSINGS) ×2 IMPLANT
SUT ETHILON 3 0 FSL (SUTURE) IMPLANT
SUT MNCRL AB 3-0 PS2 18 (SUTURE) ×3 IMPLANT
SUT NOVA NAB DX-16 0-1 5-0 T12 (SUTURE) ×6 IMPLANT
SUT NOVA NAB GS-21 0 18 T12 DT (SUTURE) IMPLANT
SUT PDS AB 0 CT 36 (SUTURE) IMPLANT
SUT VIC AB 2-0 SH 27 (SUTURE) ×2
SUT VIC AB 2-0 SH 27X BRD (SUTURE) ×1 IMPLANT
SUT VIC AB 3-0 SH 18 (SUTURE) ×3 IMPLANT
SUT VICRYL AB 2 0 TIES (SUTURE) ×3 IMPLANT
SYR CONTROL 10ML LL (SYRINGE) ×3 IMPLANT
TOWEL OR 17X24 6PK STRL BLUE (TOWEL DISPOSABLE) ×3 IMPLANT
TOWEL OR 17X26 10 PK STRL BLUE (TOWEL DISPOSABLE) ×3 IMPLANT
TRAY FOLEY CATH 16FR SILVER (SET/KITS/TRAYS/PACK) IMPLANT
TUBE CONNECTING 12'X1/4 (SUCTIONS) ×1
TUBE CONNECTING 12X1/4 (SUCTIONS) ×2 IMPLANT

## 2016-10-21 NOTE — Anesthesia Procedure Notes (Signed)
Procedure Name: Intubation Date/Time: 10/21/2016 7:46 AM Performed by: Trixie Deis A Pre-anesthesia Checklist: Patient identified, Emergency Drugs available, Suction available and Patient being monitored Patient Re-evaluated:Patient Re-evaluated prior to inductionOxygen Delivery Method: Circle System Utilized Preoxygenation: Pre-oxygenation with 100% oxygen Intubation Type: IV induction Ventilation: Mask ventilation without difficulty Laryngoscope Size: Mac and 4 Grade View: Grade I Tube type: Oral Tube size: 7.5 mm Number of attempts: 1 Airway Equipment and Method: Stylet and Oral airway Placement Confirmation: ETT inserted through vocal cords under direct vision,  positive ETCO2 and breath sounds checked- equal and bilateral Secured at: 23 cm Tube secured with: Tape Dental Injury: Teeth and Oropharynx as per pre-operative assessment

## 2016-10-21 NOTE — Op Note (Signed)
10/21/2016  9:19 AM  PATIENT:  Jeremiah Anderson  50 y.o. male  PRE-OPERATIVE DIAGNOSIS:  Incisional flank hernia  POST-OPERATIVE DIAGNOSIS:  Incisional flank hernia  PROCEDURE:  Procedure(s): OPEN FLANK HERNIA REPAIR WITH MESH (Right) INSERTION OF MESH (Right)  SURGEON:  Surgeon(s) and Role:    * Ralene Ok, MD - Primary  ASSISTANTS: Algis Greenhouse, RNFA   ANESTHESIA:   local, regional and general  EBL:  Total I/O In: 1600 [I.V.:1600] Out: 10 [Blood:10]  BLOOD ADMINISTERED:none  DRAINS: none   LOCAL MEDICATIONS USED:  BUPIVICAINE  and OTHER Exparil, 40 mL. (20 mL, 20 mL saline).  SPECIMEN:  No Specimen  DISPOSITION OF SPECIMEN:  N/A  COUNTS:  YES  TOURNIQUET:  * No tourniquets in log *  DICTATION: .Dragon Dictation  Indication for procedure: Patient is a 50 year old male with a previous history of multiple back surgeries. Patient did have a retroperitoneal approach in the right lateral abdominal wall. Subsequent patient had a bulging hernia seen on CT scan at that incision. Patient decided for repair of the hernia secondary to pain and discomfort.  Details of procedure: After the patient was consented he was taken back to the OR and placed in supine position with bilateral disease in place. He underwent general endotracheal anesthesia. Patient was in placed in left lateral decubitus position. Patient was prepped and draped in standard fashion.  Timeout was called all facts verified.  A 4 cm incision was made just approximately 2 fingerbreadths above the ASIS. Cautery was used to maintain hemostasis and dissection was taken down to the muscle layer. The external oblique was retracted posteriorly. At this point the hernia was palpable. The surrounding adipose tissue was dissected away from the fascial hernia was easily raised to 2 Coker's. This time I was able to dissect in the preperitoneal space. This was partially dissected away to approximately 6 cm  circumferentially.  Once this was partially dissected away a piece of Prolene mesh, medium point, in the 8 x 10 cm oval. This introduced and the preperitoneal space with a longitudinal portion of the mesh in a transverse fashion. At this time of Eviseal was used over the mesh to secure it in place.   At this time 20 mL of Exparil were mixed with 20 mL of saline. This was then injected circumferentially around the fascial layer.  At this time #1 Novafil using interrupted fashion to reapproximate the fascial layer. The external oblique was reapproximated anteriorly using 2-0 Vicryl's in interrupted fashion. The deep dermal layer was reapproximated using interrupted 2-0 Vicryl's in interrupted fashion. The skin was reapproximated using 3-0 Monocryl subcutaneous fashion. The skin was dressed with Steri-Strips gauze and tape. The patient was awakened general anesthesia and taken to the recovery room in stable condition.   PLAN OF CARE: Admit for overnight observation  PATIENT DISPOSITION:  PACU - hemodynamically stable.   Delay start of Pharmacological VTE agent (>24hrs) due to surgical blood loss or risk of bleeding: not applicable

## 2016-10-21 NOTE — H&P (Signed)
History of Present Illness The patient is a 50 year old male who presents for an evaluation of a hernia. Patient is a 50 year old male who is previously by Dr. Armandina Gemma for evaluation of a right flank hernia. Patient states that several years ago he had a back fusion via a retroperitoneal approach on the right side. He states that over the last several years he noticed a bulge in the right flank area. He also noticed some pain in that area. He was initially told that the pain was secondary to the back fusion. He states that the pain is sporadically on and off. Patient underwent CT scan which revealed a right flank hernia.  Patient also has had a previous laparotomy 2 for a AVM rupture and a appendectomy with postoperative bleeding.    Allergies  FentaNYL *ANALGESICS - OPIOID* Sulfa 10 *OPHTHALMIC AGENTS*  Medication History AmLODIPine Besylate (10MG  Tablet, Oral half of an tablet) Active. Flonase (50MCG/ACT Suspension, Nasal) Active. Morphine Sulfate (30MG  Tablet, Oral every three hours) Active. Robaxin-750 (750MG  Tablet, Oral) Active. Mupirocin (2% Ointment, External) Active. Ondansetron HCl (4MG  Tablet, Oral) Active. Citalopram Hydrobromide (20MG  Tablet, Oral) Active. Butalbital-APAP-Caffeine (50-325-40MG  Tablet, Oral) Active. CeleXA (20MG  Tablet, Oral daily) Active. Medications Reconciled  BP 139/83   Pulse 68   Temp 99.3 F (37.4 C) (Oral)   Resp 18   Wt 82.6 kg (182 lb)   SpO2 99%   BMI 25.38 kg/m     Physical Exam  General Mental Status-Alert. General Appearance-Consistent with stated age. Hydration-Well hydrated. Voice-Normal.  Chest and Lung Exam Chest and lung exam reveals -quiet, even and easy respiratory effort with no use of accessory muscles and on auscultation, normal breath sounds, no adventitious sounds and normal vocal resonance. Inspection Chest Wall - Normal. Back - normal.  Cardiovascular Cardiovascular  examination reveals -normal heart sounds, regular rate and rhythm with no murmurs and normal pedal pulses bilaterally.  Abdomen Note: Midline laparotomy incision, right retroperitoneal incision, with a bulge and Valsalva. Difficult to palpate the edges of the hernia.     Assessment & Plan  FLANK HERNIA (K45.8) Impression: 50 year old male with a right incisional hernia, flank hernia  1. A long discussion the patient regards to the possible approach to fix the right flank hernia. I discussed with him secondary to his laparotomies in the past this would likely require an open repair. We discussed suturing the muscles to reapproximate the area as well as placing mesh. We discussed underlay versus inlay versus overlay. 2. The patient at this time would like to discuss possible proceeding with surgery with his family. I discussed with patient that if he decides over the next one to 2 months with a likely schedule the surgery thereafter over the phone. If globulin that. Time will have him follow back up to we discussed surgery. I did discuss with him the risks and benefits of the procedure to include not limited to: Infection, bleeding, damage to structures, possible wound healing issues, possible recurrence. The patient did understand these risks.

## 2016-10-21 NOTE — Transfer of Care (Signed)
Immediate Anesthesia Transfer of Care Note  Patient: Jeremiah Anderson  Procedure(s) Performed: Procedure(s): OPEN FLANK HERNIA REPAIR WITH MESH (Right) INSERTION OF MESH (Right)  Patient Location: PACU  Anesthesia Type:General  Level of Consciousness: awake, alert  and oriented  Airway & Oxygen Therapy: Patient Spontanous Breathing and Patient connected to nasal cannula oxygen  Post-op Assessment: Report given to RN, Post -op Vital signs reviewed and stable and Patient moving all extremities  Post vital signs: Reviewed and stable  Last Vitals:  Vitals:   10/21/16 0615 10/21/16 0943  BP: 139/83   Pulse: 68   Resp: 18   Temp: 37.4 C 36.6 C    Last Pain:  Vitals:   10/21/16 0638  TempSrc:   PainSc: 4       Patients Stated Pain Goal: 3 ([id number])  Complications: No apparent anesthesia complications

## 2016-10-21 NOTE — Anesthesia Postprocedure Evaluation (Signed)
Anesthesia Post Note  Patient: Jeremiah Anderson  Procedure(s) Performed: Procedure(s) (LRB): OPEN FLANK HERNIA REPAIR WITH MESH (Right) INSERTION OF MESH (Right)  Patient location during evaluation: PACU Anesthesia Type: General Level of consciousness: awake and alert Pain management: pain level controlled Vital Signs Assessment: post-procedure vital signs reviewed and stable Respiratory status: spontaneous breathing, nonlabored ventilation, respiratory function stable and patient connected to nasal cannula oxygen Cardiovascular status: blood pressure returned to baseline and stable Postop Assessment: no signs of nausea or vomiting Anesthetic complications: no    Last Vitals:  Vitals:   10/21/16 1113 10/21/16 1115  BP: 132/85   Pulse: 71 71  Resp: (!) 9 14  Temp:  36.5 C    Last Pain:  Vitals:   10/21/16 1045  TempSrc:   PainSc: 6                  Endi Lagman,W. EDMOND

## 2016-10-21 NOTE — Discharge Instructions (Signed)
[facility] _______[facility] ° °HERNIA REPAIR: POST OP INSTRUCTIONS ° °Always review your discharge instruction sheet given to you by the facility where your surgery was performed. °IF YOU HAVE DISABILITY OR FAMILY LEAVE FORMS, YOU MUST BRING THEM TO THE OFFICE FOR PROCESSING.   °DO NOT GIVE THEM TO YOUR DOCTOR. ° °1. A  prescription for pain medication may be given to you upon discharge.  Take your pain medication as prescribed, if needed.  If narcotic pain medicine is not needed, then you may take acetaminophen (Tylenol) or ibuprofen (Advil) as needed. °2. Take your usually prescribed medications unless otherwise directed. °If you need a refill on your pain medication, please contact your pharmacy.  They will contact our office to request authorization. Prescriptions will not be filled after 5 pm or on week-ends. °3. You should follow a light diet the first 24 hours after arrival home, such as soup and crackers, etc.  Be sure to include lots of fluids daily.  Resume your normal diet the day after surgery. °4.Most patients will experience some swelling and bruising around the umbilicus or in the groin and scrotum.  Ice packs and reclining will help.  Swelling and bruising can take several days to resolve.  °6. It is common to experience some constipation if taking pain medication after surgery.  Increasing fluid intake and taking a stool softener (such as Colace) will usually help or prevent this problem from occurring.  A mild laxative (Milk of Magnesia or Miralax) should be taken according to package directions if there are no bowel movements after 48 hours. °7. Unless discharge instructions indicate otherwise, you may remove your bandages 24-48 hours after surgery, and you may shower at that time.  You may have steri-strips (small skin tapes) in place directly over the incision.  These strips should be left on the skin for 7-10 days.  If your surgeon used skin glue on the incision, you may shower in  24 hours.  The glue will flake off over the next 2-3 weeks.  Any sutures or staples will be removed at the office during your follow-up visit. °8. ACTIVITIES:  You may resume regular (light) daily activities beginning the next day--such as daily self-care, walking, climbing stairs--gradually increasing activities as tolerated.  You may have sexual intercourse when it is comfortable.  Refrain from any heavy lifting or straining until approved by your doctor. ° °a.You may drive when you are no longer taking prescription pain medication, you can comfortably wear a seatbelt, and you can safely maneuver your car and apply brakes. °b.RETURN TO WORK:   °_____________________________________________ ° °9.You should see your doctor in the office for a follow-up appointment approximately 2-3 weeks after your surgery.  Make sure that you call for this appointment within a day or two after you arrive home to insure a convenient appointment time. °10.OTHER INSTRUCTIONS: _________________________ °   _____________________________________ ° °WHEN TO CALL YOUR DOCTOR: °1. Fever over 101.0 °2. Inability to urinate °3. Nausea and/or vomiting °4. Extreme swelling or bruising °5. Continued bleeding from incision. °6. Increased pain, redness, or drainage from the incision ° °The clinic staff is available to answer your questions during regular business hours.  Please don’t hesitate to call and ask to speak to one of the nurses for clinical concerns.  If you have a medical emergency, go to the nearest emergency room or call 911.  A surgeon from [facility] is always on call at the hospital ° ° °[address], [address], [place], [place]  [address] ? ° [address] [address], [place], [place]   [address] °[phone] ? [phone] ? FAX [phone] °Web site: [email or web address] ° °

## 2016-10-22 DIAGNOSIS — M1990 Unspecified osteoarthritis, unspecified site: Secondary | ICD-10-CM | POA: Diagnosis not present

## 2016-10-22 DIAGNOSIS — I10 Essential (primary) hypertension: Secondary | ICD-10-CM | POA: Diagnosis not present

## 2016-10-22 DIAGNOSIS — K219 Gastro-esophageal reflux disease without esophagitis: Secondary | ICD-10-CM | POA: Diagnosis not present

## 2016-10-22 DIAGNOSIS — K432 Incisional hernia without obstruction or gangrene: Secondary | ICD-10-CM | POA: Diagnosis not present

## 2016-10-22 DIAGNOSIS — M549 Dorsalgia, unspecified: Secondary | ICD-10-CM | POA: Diagnosis not present

## 2016-10-22 DIAGNOSIS — G8929 Other chronic pain: Secondary | ICD-10-CM | POA: Diagnosis not present

## 2016-10-22 MED ORDER — TRAMADOL HCL 50 MG PO TABS: 50.0000 mg | ORAL_TABLET | Freq: Four times a day (QID) | ORAL | 0 refills | Status: DC | PRN

## 2016-10-22 NOTE — Progress Notes (Signed)
1 Day Post-Op  Subjective: PT doing well today. Pain well controlled.   Objective: Vital signs in last 24 hours: Temp:  [97.5 F (36.4 C)-99.1 F (37.3 C)] 98.5 F (36.9 C) (12/02 0628) Pulse Rate:  [61-82] 69 (12/02 0628) Resp:  [9-25] 11 (12/02 0628) BP: (118-155)/(79-97) 129/89 (12/02 0255) SpO2:  [98 %-100 %] 98 % (12/02 0628) Last BM Date: 10/20/16  Intake/Output from previous day: 12/01 0701 - 12/02 0700 In: 3581.3 [P.O.:360; I.V.:3221.3] Out: 4085 [Urine:4075; Blood:10] Intake/Output this shift: Total I/O In: 825 [P.O.:360; I.V.:465] Out: 1950 [Urine:1950]  General appearance: alert and cooperative GI: soft, non-tender; bowel sounds normal; no masses,  no organomegaly and incision c/d/i  Lab Results:  No results for input(s): WBC, HGB, HCT, PLT in the last 72 hours. BMET No results for input(s): NA, K, CL, CO2, GLUCOSE, BUN, CREATININE, CALCIUM in the last 72 hours. PT/INR No results for input(s): LABPROT, INR in the last 72 hours. ABG No results for input(s): PHART, HCO3 in the last 72 hours.  Invalid input(s): PCO2, PO2  Studies/Results: No results found.  Anti-infectives: Anti-infectives    Start     Dose/Rate Route Frequency Ordered Stop   10/21/16 [id number]  ceFAZolin (ANCEF) IVPB 2g/100 mL premix  Status:  Discontinued     2 g 200 mL/hr over 30 Minutes Intravenous On call to O.R. 10/21/16 [id number] 10/21/16 1130   10/21/16 0521  ceFAZolin (ANCEF) IVPB 2g/100 mL premix     2 g 200 mL/hr over 30 Minutes Intravenous On call to O.R. 10/21/16 0521 10/21/16 0731      Assessment/Plan: s/p Procedure(s): OPEN FLANK HERNIA REPAIR WITH MESH (Right) INSERTION OF MESH (Right) Dc PCA Mobilize as tol Plan for home later today/tomorrow if tol pain OK   LOS: 1 day    Rosario Jacks., Lenardo Westwood 10/22/2016

## 2016-10-23 DIAGNOSIS — G8929 Other chronic pain: Secondary | ICD-10-CM | POA: Diagnosis not present

## 2016-10-23 DIAGNOSIS — M549 Dorsalgia, unspecified: Secondary | ICD-10-CM | POA: Diagnosis not present

## 2016-10-23 DIAGNOSIS — M1990 Unspecified osteoarthritis, unspecified site: Secondary | ICD-10-CM | POA: Diagnosis not present

## 2016-10-23 DIAGNOSIS — K219 Gastro-esophageal reflux disease without esophagitis: Secondary | ICD-10-CM | POA: Diagnosis not present

## 2016-10-23 DIAGNOSIS — I10 Essential (primary) hypertension: Secondary | ICD-10-CM | POA: Diagnosis not present

## 2016-10-23 DIAGNOSIS — K432 Incisional hernia without obstruction or gangrene: Secondary | ICD-10-CM | POA: Diagnosis not present

## 2016-10-23 NOTE — Discharge Summary (Signed)
Physician Discharge Summary  Patient ID: Jeremiah Anderson MRN: [id number] DOB/AGE: 1965-12-21 50 y.o.  Admit date: 10/21/2016 Discharge date: 10/23/2016  Admission Diagnoses: s/p hernia repair, chronic back pain  Discharge Diagnoses:  Active Problems:   S/P hernia repair   Discharged Condition: good  Hospital Course: 50 y/o M s/p Open flank hernia repair with mesh.  Post op pt was admitted to the floor.  He was started on his home pain Rx. He was started on a CLD and adv to a reg diet as tol and tolerated that well.  He was ambulating on his own.  He had good pain control.  He was deemed stable for DC and dc'd home.  Consults: None  Significant Diagnostic Studies: none  Treatments: surgery: as above  Discharge Exam: Blood pressure (!) 145/85, pulse 85, temperature 99.4 F (37.4 C), resp. rate 19, weight 82.6 kg (182 lb), SpO2 98 %. General appearance: alert and cooperative Cardio: regular rate and rhythm, S1, S2 normal, no murmur, click, rub or gallop GI: incision c/d/i  Disposition: 01-Home or Self Care  Discharge Instructions    Diet - low sodium heart healthy    Complete by:  As directed    Increase activity slowly    Complete by:  As directed        Medication List    TAKE these medications   amLODipine 10 MG tablet Commonly known as:  NORVASC TAKE ONE TABLET BY MOUTH ONCE DAILY What changed:  See the new instructions.   butalbital-acetaminophen-caffeine 50-325-40 MG tablet Commonly known as:  FIORICET, ESGIC TAKE ONE TABLET BY MOUTH THREE TIMES A DAY AS NEEDED   CoQ10 100 MG Caps Take 100 mg by mouth daily.   fluticasone 50 MCG/ACT nasal spray Commonly known as:  FLONASE Place 2 sprays into both nostrils daily. What changed:  when to take this  reasons to take this   Lidocaine 4 % Ptch Place 1-2 patches onto the skin 2 (two) times daily as needed (for pain.). Salonpas Maximum Strength Pain Relieving Gel-Patch   methocarbamol 750 MG tablet Commonly  known as:  ROBAXIN Take 750 mg by mouth every 6 (six) hours as needed for muscle spasms.   morphine 30 MG tablet Commonly known as:  MSIR Take 30 mg by mouth every 3 (three) hours. 8 TIMES A DAY   multivitamin with minerals Tabs tablet Take 1 tablet by mouth daily.   OMEGA 3 PO Take 120 mg by mouth daily.   ondansetron 4 MG tablet Commonly known as:  ZOFRAN TAKE ONE TABLET BY MOUTH EVERY 8 HOURS AS NEEDED FOR NAUSEA OR  VOMITING   RED YEAST RICE PO Take 1 capsule by mouth daily.   traMADol 50 MG tablet Commonly known as:  ULTRAM Take 1 tablet (50 mg total) by mouth every 6 (six) hours as needed.      Follow-up Information    Schedule an appointment as soon as possible for a visit with Reyes Ivan, MD.   Specialty:  General Surgery Why:  For wound re-check Contact information: [place]  [phone]           Signed: Rosario Jacks., Anne Hahn 10/23/2016, 8:10 AM

## 2016-10-23 NOTE — Progress Notes (Signed)
Patient discharged to home with instructions. 

## 2016-10-23 NOTE — Care Management (Signed)
Reviewed for possible discharge needs. Presenter, broadcasting BSN CCM

## 2016-10-24 ENCOUNTER — Encounter (HOSPITAL_COMMUNITY): Payer: Self-pay | Admitting: General Surgery

## 2016-10-28 ENCOUNTER — Ambulatory Visit (INDEPENDENT_AMBULATORY_CARE_PROVIDER_SITE_OTHER): Payer: Medicare Other | Admitting: Psychology

## 2016-10-28 DIAGNOSIS — F901 Attention-deficit hyperactivity disorder, predominantly hyperactive type: Secondary | ICD-10-CM | POA: Diagnosis not present

## 2016-11-03 ENCOUNTER — Other Ambulatory Visit: Payer: Self-pay | Admitting: Family

## 2016-11-03 DIAGNOSIS — J340 Abscess, furuncle and carbuncle of nose: Secondary | ICD-10-CM

## 2016-11-04 ENCOUNTER — Ambulatory Visit (INDEPENDENT_AMBULATORY_CARE_PROVIDER_SITE_OTHER): Payer: Medicare Other | Admitting: Psychology

## 2016-11-04 DIAGNOSIS — F901 Attention-deficit hyperactivity disorder, predominantly hyperactive type: Secondary | ICD-10-CM

## 2016-11-04 IMAGING — MR MR LUMBAR SPINE W/O CM
4 of 5 series · 25 of 48 positions shown · non-contrast
Comparison: Sacrum/coccyx radiographs 12/18/2015. CT abdomen and
pelvis 06/09/2015.

CLINICAL DATA: Low back pain, right worse than left and present for
3 months. Increased pain in the past 2 weeks. Numerous prior lumbar
spine surgeries, most recently 4165.

EXAM:
MRI LUMBAR SPINE WITHOUT CONTRAST
TECHNIQUE: Multiplanar, multisequence MR imaging of the lumbar spine was
performed. No intravenous contrast was administered.

[Series 3: T2 · sagittal · 4.0mm · 0.55mm/px · 6 of 15 slices shown (1 of 2)]
[im 1/15]
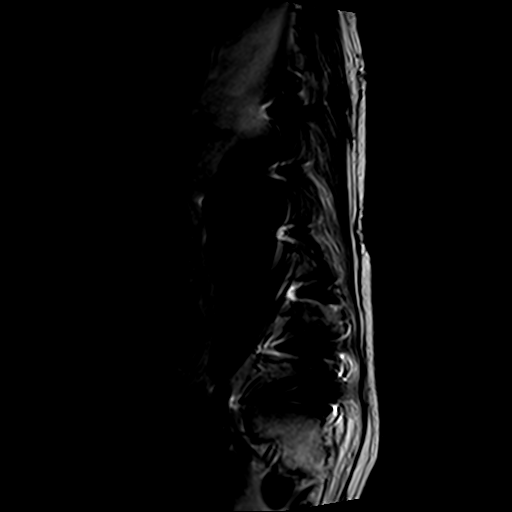
[im 3/15]
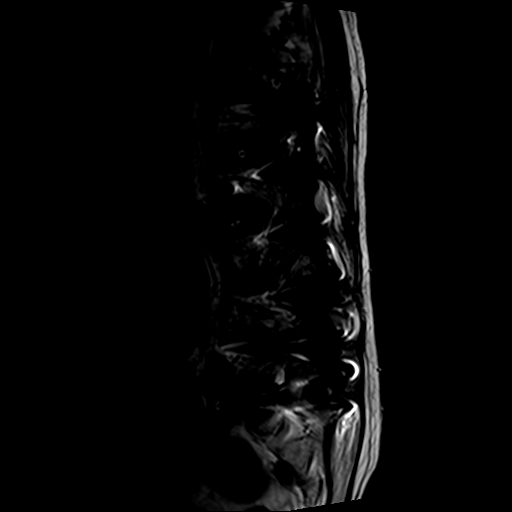
[im 6/15]
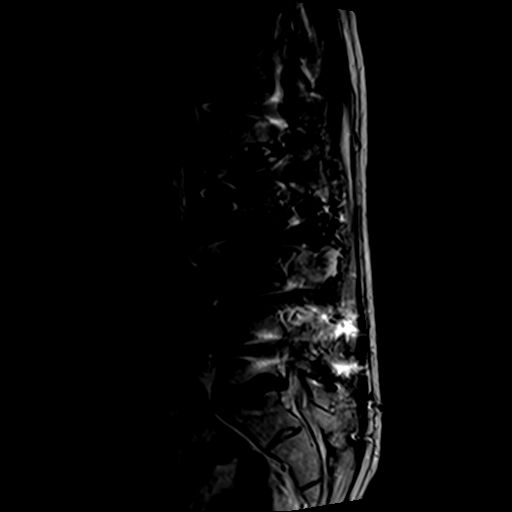
[im 9/15]
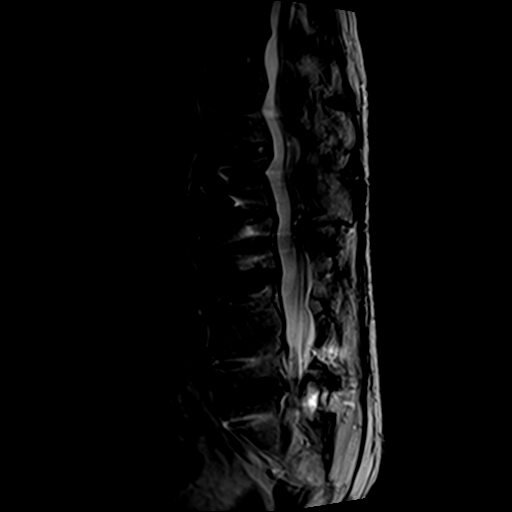
[im 12/15]
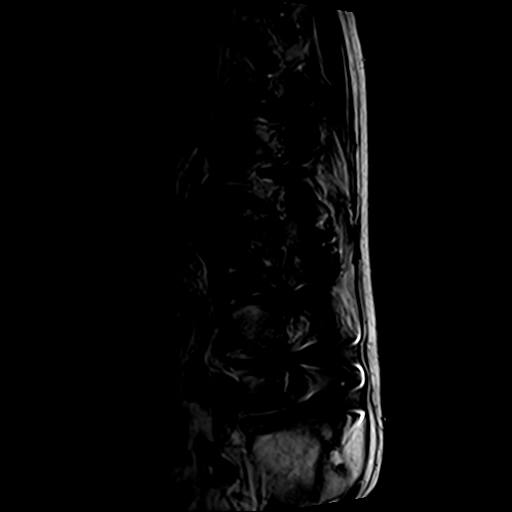
[im 15/15]
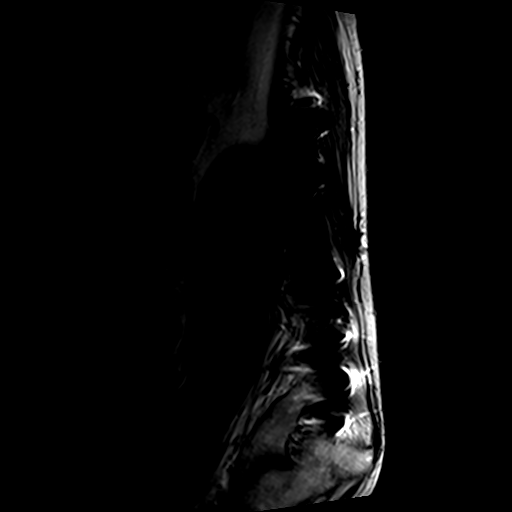

[Series 4: T1 · sagittal · 4.0mm · 0.55mm/px · 6 of 15 slices shown (1 of 2)]
[im 1/15]
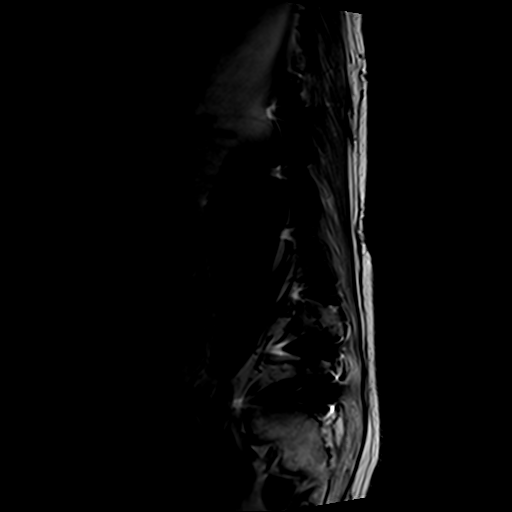
[im 3/15]
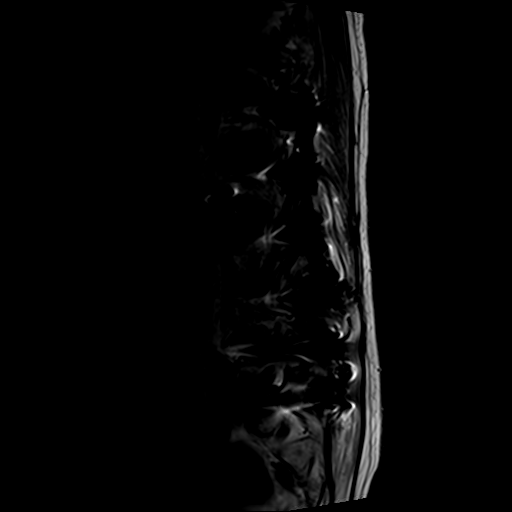
[im 6/15]
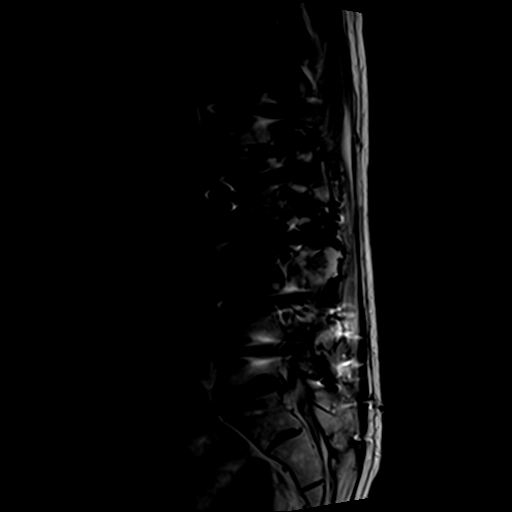
[im 9/15]
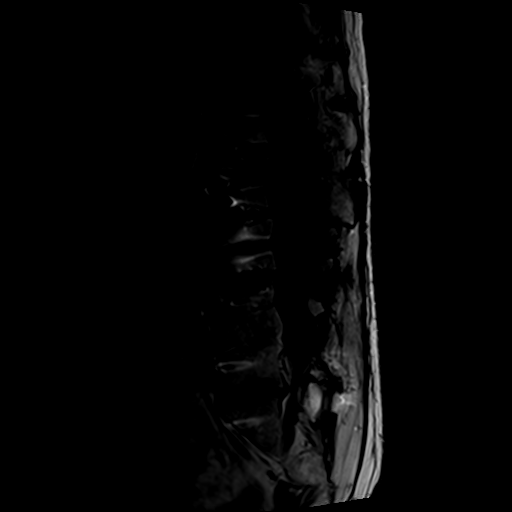
[im 12/15]
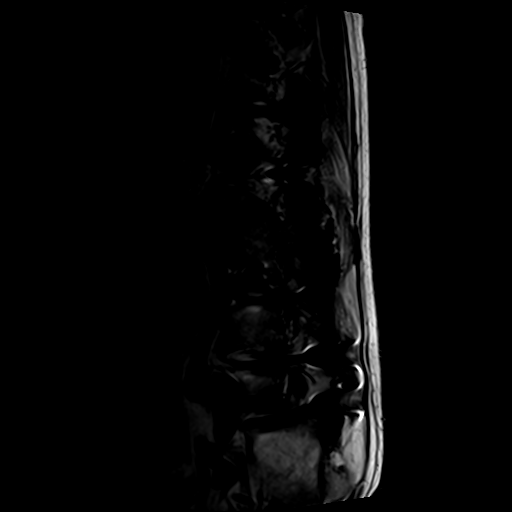
[im 15/15]
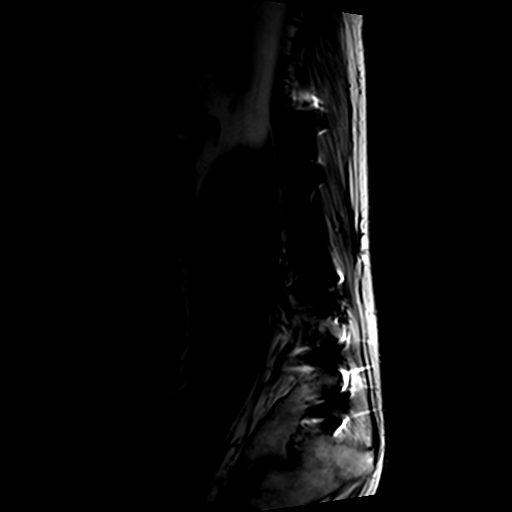

[Series 6: T2 · axial · 4.0mm · 0.70mm/px · z∈[-78,+119]mm · 9 of 39 slices shown (2 of 2)]
[im 1/39]
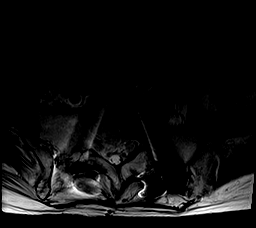
[im 6/39]
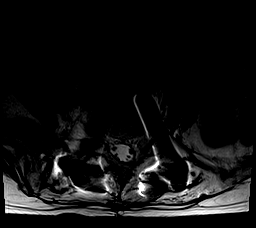
[im 11/39]
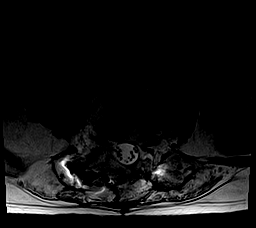
[im 17/39]
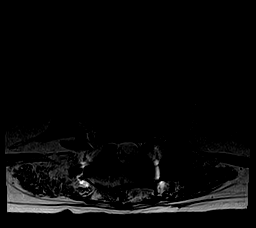
[im 20/39]
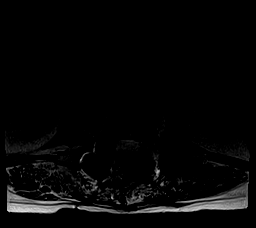
[im 22/39]
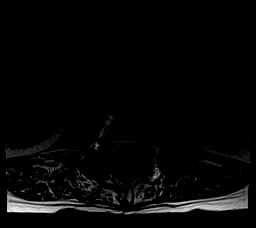
[im 28/39]
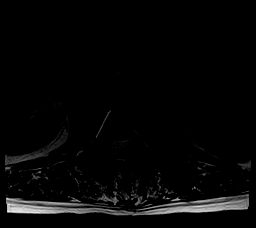
[im 33/39]
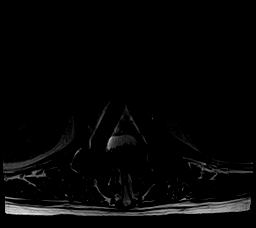
[im 39/39]
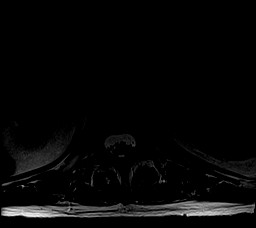

[Series 7: T1 · axial · 4.0mm · 0.35mm/px · z∈[-78,+88]mm · 4 of 39 slices shown (2 of 2)]
[im 1/39]
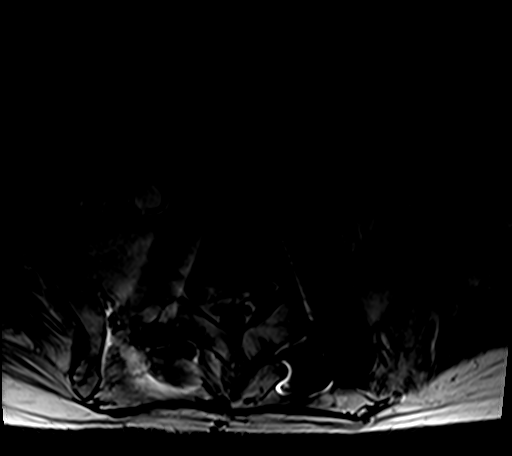
[im 6/39]
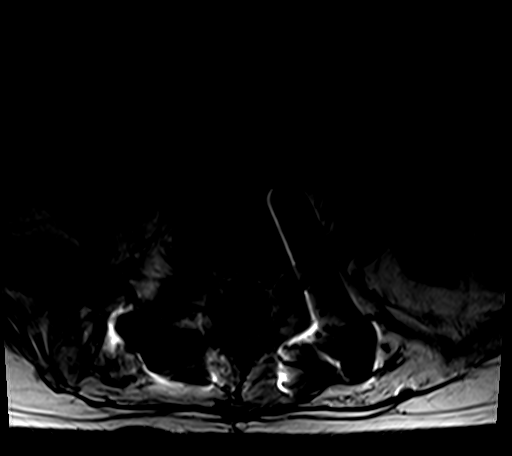
[im 20/39]
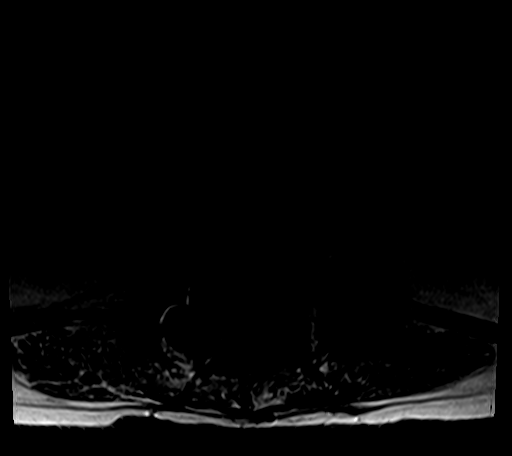
[im 33/39]
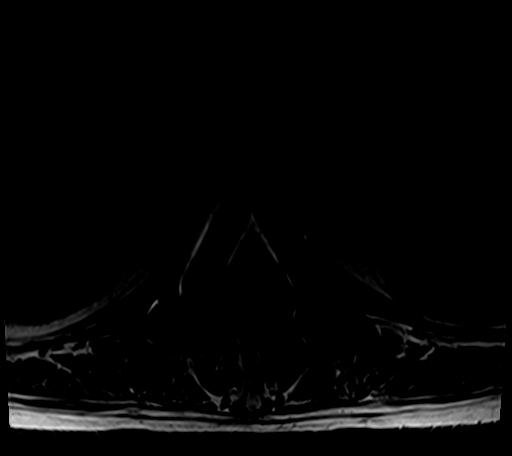

[25 of 48 positions shown; findings below may reference images not displayed]

FINDINGS: Vertebral alignment is unchanged with straightening of the normal
lumbar lordosis again seen. Sequelae of L1-S1 posterior fusion are
again identified with intervertebral fusion from L1-L5. Interbody
spacer subsidence is again seen into the L3 superior endplate.
Pedicle screws are present on the right at L3 and bilaterally at the
other levels. No significant vertebral marrow edema is seen.

The conus medullaris is normal in signal and terminates at the
superior aspect of L1. A 6 mm T2 hyperintense lesion in the upper
pole the right kidney likely represents a cyst.

T11-12: Only imaged sagittally. Mild disc bulging and mild facet
arthrosis result in mild-to-moderate left neural foraminal stenosis.
No significant spinal stenosis.

T12-L1: Mild disc bulging and mild facet arthrosis without stenosis.

L1-2: Prior posterior decompression and fusion. Spinal canal and
left neural foramen are widely patent. There may be mild right
neural foraminal narrowing.

L2-3: Prior posterior decompression and fusion.  No stenosis.

L3-4:  Prior posterior decompression and fusion.  No stenosis.

L4-5:  Prior posterior decompression and fusion.  No stenosis.

L5-S1: Prior posterior fusion. Mild disc bulging and endplate
spurring result in mild bilateral neural foraminal stenosis. No
spinal stenosis.
IMPRESSION: 1. Prior L1-S1 fusion.  No spinal stenosis.
2. Mild bilateral neural foraminal stenosis at L5-S1.
3. Mild-to-moderate left foraminal stenosis at T11-12.

## 2016-11-07 ENCOUNTER — Other Ambulatory Visit: Payer: Self-pay | Admitting: Family

## 2016-11-07 NOTE — Telephone Encounter (Signed)
Last refill was 10/11/16

## 2016-11-09 NOTE — Telephone Encounter (Signed)
Faxed

## 2016-11-11 ENCOUNTER — Ambulatory Visit (INDEPENDENT_AMBULATORY_CARE_PROVIDER_SITE_OTHER): Payer: Medicare Other | Admitting: Psychology

## 2016-11-11 DIAGNOSIS — F901 Attention-deficit hyperactivity disorder, predominantly hyperactive type: Secondary | ICD-10-CM | POA: Diagnosis not present

## 2016-11-17 DIAGNOSIS — R1031 Right lower quadrant pain: Secondary | ICD-10-CM | POA: Diagnosis not present

## 2016-11-18 ENCOUNTER — Ambulatory Visit (INDEPENDENT_AMBULATORY_CARE_PROVIDER_SITE_OTHER): Payer: Medicare Other | Admitting: Psychology

## 2016-11-18 DIAGNOSIS — F901 Attention-deficit hyperactivity disorder, predominantly hyperactive type: Secondary | ICD-10-CM | POA: Diagnosis not present

## 2016-11-23 ENCOUNTER — Other Ambulatory Visit: Payer: Self-pay | Admitting: Family

## 2016-11-23 DIAGNOSIS — R1031 Right lower quadrant pain: Secondary | ICD-10-CM | POA: Diagnosis not present

## 2016-11-25 ENCOUNTER — Ambulatory Visit (INDEPENDENT_AMBULATORY_CARE_PROVIDER_SITE_OTHER): Payer: Medicare Other | Admitting: Psychology

## 2016-11-25 DIAGNOSIS — F900 Attention-deficit hyperactivity disorder, predominantly inattentive type: Secondary | ICD-10-CM | POA: Diagnosis not present

## 2016-11-30 DIAGNOSIS — N486 Induration penis plastica: Secondary | ICD-10-CM | POA: Diagnosis not present

## 2016-11-30 DIAGNOSIS — E291 Testicular hypofunction: Secondary | ICD-10-CM | POA: Diagnosis not present

## 2016-12-05 DIAGNOSIS — R1031 Right lower quadrant pain: Secondary | ICD-10-CM | POA: Diagnosis not present

## 2016-12-06 DIAGNOSIS — E291 Testicular hypofunction: Secondary | ICD-10-CM | POA: Diagnosis not present

## 2016-12-07 ENCOUNTER — Ambulatory Visit: Payer: Medicare Other | Admitting: Psychology

## 2016-12-09 ENCOUNTER — Other Ambulatory Visit: Payer: Self-pay | Admitting: Family

## 2016-12-09 NOTE — Telephone Encounter (Signed)
Last refill was 11/08/16

## 2016-12-09 NOTE — Telephone Encounter (Signed)
Pt left msg on triage stating he has a terrible migraine pharmacy sent request wanting refill to be done ASAP...Jeremiah Anderson

## 2016-12-09 NOTE — Telephone Encounter (Signed)
Faxed back to [facility]...Johny Chess

## 2016-12-09 NOTE — Telephone Encounter (Signed)
Medication printed to be faxed.  

## 2016-12-14 ENCOUNTER — Ambulatory Visit: Payer: Medicare Other | Admitting: Psychology

## 2016-12-15 ENCOUNTER — Other Ambulatory Visit: Payer: Self-pay | Admitting: Family

## 2016-12-15 DIAGNOSIS — G894 Chronic pain syndrome: Secondary | ICD-10-CM | POA: Diagnosis not present

## 2016-12-15 DIAGNOSIS — F4323 Adjustment disorder with mixed anxiety and depressed mood: Secondary | ICD-10-CM | POA: Diagnosis not present

## 2016-12-15 DIAGNOSIS — M961 Postlaminectomy syndrome, not elsewhere classified: Secondary | ICD-10-CM | POA: Diagnosis not present

## 2016-12-15 DIAGNOSIS — Z79891 Long term (current) use of opiate analgesic: Secondary | ICD-10-CM | POA: Diagnosis not present

## 2016-12-16 ENCOUNTER — Ambulatory Visit (INDEPENDENT_AMBULATORY_CARE_PROVIDER_SITE_OTHER): Payer: Medicare Other | Admitting: Psychology

## 2016-12-16 DIAGNOSIS — F901 Attention-deficit hyperactivity disorder, predominantly hyperactive type: Secondary | ICD-10-CM | POA: Diagnosis not present

## 2016-12-19 ENCOUNTER — Encounter (INDEPENDENT_AMBULATORY_CARE_PROVIDER_SITE_OTHER): Payer: Self-pay | Admitting: Specialist

## 2016-12-19 ENCOUNTER — Ambulatory Visit (INDEPENDENT_AMBULATORY_CARE_PROVIDER_SITE_OTHER): Payer: Medicare Other | Admitting: Specialist

## 2016-12-19 ENCOUNTER — Ambulatory Visit (INDEPENDENT_AMBULATORY_CARE_PROVIDER_SITE_OTHER): Payer: Medicare Other

## 2016-12-19 VITALS — BP 134/91 | HR 82 | Ht 71.0 in | Wt 177.0 lb

## 2016-12-19 DIAGNOSIS — M47814 Spondylosis without myelopathy or radiculopathy, thoracic region: Secondary | ICD-10-CM

## 2016-12-19 DIAGNOSIS — Z09 Encounter for follow-up examination after completed treatment for conditions other than malignant neoplasm: Secondary | ICD-10-CM

## 2016-12-19 NOTE — Patient Instructions (Signed)
Avoid frequent bending and stooping  No lifting greater than 10 lbs. May use ice or moist heat for pain. Weight loss is of benefit. Handicap license is approved.   

## 2016-12-19 NOTE — Progress Notes (Signed)
Office Visit Note   Patient: Jeremiah Anderson           Date of Birth: February 27, 1966           MRN: Jeremiah Anderson Visit Date: 12/19/2016              Requested by: Jeremiah Anderson, [place],  [id number] PCP: Jeremiah Po, FNP   Assessment & Plan: Visit Diagnoses:  1. Status post right inguinal hernia repair, follow-up exam   2. Spondylosis of thoracic region without myelopathy or radiculopathy     Plan: Avoid frequent bending and stooping  No lifting greater than 10 lbs. May use ice or moist heat for pain. Weight loss is of benefit. Handicap license is approved.    Follow-Up Instructions: Return in about 3 months (around 03/19/2017).   Orders:  Orders Placed This Encounter  Procedures  . XR Lumbar Spine 2-3 Views   No orders of the defined types were placed in this encounter.     Procedures: No procedures performed   Clinical Data: No additional findings.   Subjective: Chief Complaint  Patient presents with  . Lower Back - Pain, Follow-up    Jeremiah Anderson is here for follow up on his back pain. States that on Friday he went to bed fine then woke up Saturday feeling like he just came out of surgery.  He states that he had pain at the top of the incisionand the pain, comes along the right lower ribs into the abdomen. Has pain over the right flank, much better since hernia repair right flank.Chronic weakness right foot dorsiflexion, occasional pain right anterior distal leg just above the right ankle.     Review of Systems  Constitutional: Negative.   HENT: Negative.   Eyes: Negative.   Respiratory: Negative.   Cardiovascular: Negative.   Gastrointestinal: Negative.   Endocrine: Negative.   Genitourinary: Negative.   Musculoskeletal: Negative.   Skin: Negative.   Allergic/Immunologic: Negative.   Neurological: Negative.   Hematological: Negative.   Psychiatric/Behavioral: Negative.      Objective: Vital Signs: BP (!) 134/91 (BP  Location: Left Arm, Patient Position: Sitting)   Pulse 82   Ht 5\' 11"  (1.803 m)   Wt 177 lb (80.3 kg)   BMI 24.69 kg/m   Physical Exam  Constitutional: He is oriented to person, place, and time. He appears well-developed and well-nourished.  HENT:  Head: Normocephalic and atraumatic.  Eyes: EOM are normal. Pupils are equal, round, and reactive to light.  Neck: Normal range of motion. Neck supple.  Pulmonary/Chest: Effort normal and breath sounds normal.  Abdominal: Soft. Bowel sounds are normal.  Neurological: He is alert and oriented to person, place, and time.  Skin: Skin is warm and dry.  Psychiatric: He has a normal mood and affect. His behavior is normal. Judgment and thought content normal.    Back Exam   Tenderness  The patient is experiencing tenderness in the lumbar.  Range of Motion  Extension:  10 abnormal  Flexion: normal  Lateral Bend Right: abnormal  Lateral Bend Left: abnormal  Rotation Right: abnormal  Rotation Left: abnormal   Muscle Strength  Right Quadriceps:  5/5  Left Quadriceps:  5/5  Right Hamstrings:  5/5  Left Hamstrings:  5/5   Tests  Straight leg raise right: negative Straight leg raise left: negative  Reflexes  Patellar:  2/4 normal Achilles:  0/4 abnormal Babinski's sign: normal   Other  Toe Walk: normal Heel  Walk: abnormal Sensation: normal Gait: normal  Erythema: no back redness Scars: present  Comments:  Absent right ankle jerk, weakness right foot dorsiflex.      Specialty Comments:  No specialty comments available.  Imaging: Xr Lumbar Spine 2-3 Views  Result Date: 12/19/2016 AP and lateral flexion and extension radiographs of the lumbar spine show Fusion L1 to S1 with interbody fusions L1-2, L2-3, L3-4 and L4-5. Posterior fusion L5-S1. Hypolordosis of the lumbar spine, hypo kyphosis of the dorsal spine. Minimal spur formation anterior T12-L1, minimal degenerative disc narrowing T11-12, DDD T8-9, T9-10 and  T10-11.    PMFS History: Patient Active Problem List   Diagnosis Date Noted  . S/P hernia repair 10/21/2016  . Painful penile erection 10/06/2016  . Ingrown toenail 09/26/2016  . Flank pain 07/12/2016  . Right-sided chest wall pain 07/12/2016  . Abscess of nasal cavity 06/14/2016  . Right flank mass 06/14/2016  . Tobacco use disorder 06/14/2016  . Overweight (BMI 25.0-29.9) 06/14/2016  . Skin exam, screening for cancer 04/04/2016  . Sacral contusion 12/18/2015  . Sinusitis, chronic 08/12/2015  . Arthralgia 07/06/2015  . Intractable nausea and vomiting 06/10/2015  . Pill-induced gastritis 06/10/2015  . Chronic back pain 06/10/2015  . Multiple duodenal ulcers 06/10/2015  . Nausea & vomiting 05/27/2015  . Rash 05/07/2015  . Hyperlipidemia 05/07/2015  . Migraines 04/21/2015  . 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 Mood change ([place]) 03/19/2015  . Rib pain on right side 02/16/2015  . Essential hypertension 02/16/2015  . Low back pain 02/16/2015   Past Medical History:  Diagnosis Date  . Arthritis   . GERD (gastroesophageal reflux disease)   . Hyperlipidemia   . Hypertension   . Migraines   . Wears glasses     Family History  Problem Relation Age of Onset  . Arthritis Mother   . Hyperlipidemia Mother   . Hypertension Father     Past Surgical History:  Procedure Laterality Date  . APPENDECTOMY  1980s   post op bleeding sent him back for ex lap and hemostatic intervention.   Marland Kitchen BACK SURGERY     8 surgeries total - fused from L1-S1  . COLONOSCOPY    . ESOPHAGOGASTRODUODENOSCOPY N/A 06/10/2015   Procedure: ESOPHAGOGASTRODUODENOSCOPY (EGD);  Surgeon: Jeremiah Dragon, MD;  Location[facility] ENDOSCOPY;  Service: Endoscopy;  Laterality: N/A;  . ex lap with peri gastric vessel ligation  1990s   after retching, he rupture a blood vessel "outside" the stomach.  vessel was ligated per pt's descriiption.   Jeremiah Anderson HERNIA REPAIR Right 10/21/2016   Procedure: OPEN FLANK HERNIA  REPAIR WITH MESH;  Surgeon: Jeremiah Ok, MD;  Location: [facility];  Service: General;  Laterality: Right;  . INSERTION OF MESH Right 10/21/2016   Procedure: INSERTION OF MESH;  Surgeon: Jeremiah Ok, MD;  Location: [place];  Service: General;  Laterality: Right;  . KNEE SURGERY     Multiple bilateral scopes  . RETINAL DETACHMENT SURGERY Right   . SHOULDER SURGERY     4x on the left - total shoulder replacement.   Marland Kitchen SHOULDER SURGERY Left    x2  . TOTAL SHOULDER ARTHROPLASTY Left    x2   Social History   Occupational History  . Disability     Former Therapist, sports   Social History Main Topics  . Smoking status: Current Every Day Smoker    Packs/day: 1.00    Types: Cigarettes  . Smokeless tobacco: Never Used  . Alcohol use 0.0  oz/week     Comment: rarely  . Drug use: No  . Sexual activity: Not on file

## 2016-12-20 DIAGNOSIS — R531 Weakness: Secondary | ICD-10-CM | POA: Diagnosis not present

## 2016-12-21 ENCOUNTER — Ambulatory Visit (INDEPENDENT_AMBULATORY_CARE_PROVIDER_SITE_OTHER): Payer: Medicare Other | Admitting: Psychology

## 2016-12-21 DIAGNOSIS — F901 Attention-deficit hyperactivity disorder, predominantly hyperactive type: Secondary | ICD-10-CM | POA: Diagnosis not present

## 2016-12-22 DIAGNOSIS — R531 Weakness: Secondary | ICD-10-CM | POA: Diagnosis not present

## 2016-12-26 DIAGNOSIS — M5412 Radiculopathy, cervical region: Secondary | ICD-10-CM | POA: Diagnosis not present

## 2016-12-26 DIAGNOSIS — M545 Low back pain: Secondary | ICD-10-CM | POA: Diagnosis not present

## 2016-12-26 DIAGNOSIS — M47892 Other spondylosis, cervical region: Secondary | ICD-10-CM | POA: Diagnosis not present

## 2016-12-26 DIAGNOSIS — M47812 Spondylosis without myelopathy or radiculopathy, cervical region: Secondary | ICD-10-CM | POA: Diagnosis not present

## 2016-12-28 ENCOUNTER — Ambulatory Visit: Payer: Medicare Other | Admitting: Psychology

## 2017-01-03 ENCOUNTER — Other Ambulatory Visit: Payer: Self-pay | Admitting: Family

## 2017-01-04 ENCOUNTER — Ambulatory Visit (INDEPENDENT_AMBULATORY_CARE_PROVIDER_SITE_OTHER): Payer: Medicare Other | Admitting: Psychology

## 2017-01-04 DIAGNOSIS — F901 Attention-deficit hyperactivity disorder, predominantly hyperactive type: Secondary | ICD-10-CM | POA: Diagnosis not present

## 2017-01-04 NOTE — Telephone Encounter (Signed)
Last refill was 12/09/16

## 2017-01-05 ENCOUNTER — Other Ambulatory Visit: Payer: Self-pay | Admitting: Family

## 2017-01-05 DIAGNOSIS — R531 Weakness: Secondary | ICD-10-CM | POA: Diagnosis not present

## 2017-01-05 NOTE — Telephone Encounter (Signed)
Script faxed to [facility]...Jeremiah Anderson

## 2017-01-06 ENCOUNTER — Ambulatory Visit (INDEPENDENT_AMBULATORY_CARE_PROVIDER_SITE_OTHER): Payer: Medicare Other | Admitting: Psychology

## 2017-01-06 DIAGNOSIS — F901 Attention-deficit hyperactivity disorder, predominantly hyperactive type: Secondary | ICD-10-CM

## 2017-01-09 ENCOUNTER — Other Ambulatory Visit: Payer: Self-pay | Admitting: Family

## 2017-01-09 DIAGNOSIS — I10 Essential (primary) hypertension: Secondary | ICD-10-CM

## 2017-01-10 ENCOUNTER — Encounter: Payer: Medicare Other | Admitting: Family

## 2017-01-11 ENCOUNTER — Ambulatory Visit (INDEPENDENT_AMBULATORY_CARE_PROVIDER_SITE_OTHER): Payer: Medicare Other | Admitting: Psychology

## 2017-01-11 DIAGNOSIS — F901 Attention-deficit hyperactivity disorder, predominantly hyperactive type: Secondary | ICD-10-CM

## 2017-01-12 DIAGNOSIS — R1031 Right lower quadrant pain: Secondary | ICD-10-CM | POA: Diagnosis not present

## 2017-01-12 DIAGNOSIS — G894 Chronic pain syndrome: Secondary | ICD-10-CM | POA: Diagnosis not present

## 2017-01-12 DIAGNOSIS — F4323 Adjustment disorder with mixed anxiety and depressed mood: Secondary | ICD-10-CM | POA: Diagnosis not present

## 2017-01-12 DIAGNOSIS — M961 Postlaminectomy syndrome, not elsewhere classified: Secondary | ICD-10-CM | POA: Diagnosis not present

## 2017-01-12 DIAGNOSIS — Z79891 Long term (current) use of opiate analgesic: Secondary | ICD-10-CM | POA: Diagnosis not present

## 2017-01-18 ENCOUNTER — Ambulatory Visit: Payer: Medicare Other | Admitting: Psychology

## 2017-01-18 DIAGNOSIS — R112 Nausea with vomiting, unspecified: Secondary | ICD-10-CM | POA: Diagnosis not present

## 2017-01-18 DIAGNOSIS — K219 Gastro-esophageal reflux disease without esophagitis: Secondary | ICD-10-CM | POA: Diagnosis not present

## 2017-01-18 DIAGNOSIS — R51 Headache: Secondary | ICD-10-CM | POA: Diagnosis not present

## 2017-01-19 DIAGNOSIS — M5030 Other cervical disc degeneration, unspecified cervical region: Secondary | ICD-10-CM | POA: Diagnosis not present

## 2017-01-19 DIAGNOSIS — M47892 Other spondylosis, cervical region: Secondary | ICD-10-CM | POA: Diagnosis not present

## 2017-01-19 DIAGNOSIS — M47812 Spondylosis without myelopathy or radiculopathy, cervical region: Secondary | ICD-10-CM | POA: Diagnosis not present

## 2017-01-23 DIAGNOSIS — E291 Testicular hypofunction: Secondary | ICD-10-CM | POA: Diagnosis not present

## 2017-01-25 ENCOUNTER — Ambulatory Visit: Payer: Medicare Other | Admitting: Psychology

## 2017-01-27 ENCOUNTER — Encounter: Payer: Self-pay | Admitting: Family

## 2017-01-27 ENCOUNTER — Ambulatory Visit (INDEPENDENT_AMBULATORY_CARE_PROVIDER_SITE_OTHER): Payer: Medicare Other | Admitting: Family

## 2017-01-27 VITALS — BP 136/82 | HR 79 | Temp 99.0°F | Resp 16 | Ht 71.0 in | Wt 169.0 lb

## 2017-01-27 DIAGNOSIS — E782 Mixed hyperlipidemia: Secondary | ICD-10-CM

## 2017-01-27 DIAGNOSIS — F17200 Nicotine dependence, unspecified, uncomplicated: Secondary | ICD-10-CM

## 2017-01-27 DIAGNOSIS — Z1211 Encounter for screening for malignant neoplasm of colon: Secondary | ICD-10-CM | POA: Diagnosis not present

## 2017-01-27 DIAGNOSIS — Z125 Encounter for screening for malignant neoplasm of prostate: Secondary | ICD-10-CM | POA: Diagnosis not present

## 2017-01-27 DIAGNOSIS — Z0000 Encounter for general adult medical examination without abnormal findings: Secondary | ICD-10-CM | POA: Diagnosis not present

## 2017-01-27 DIAGNOSIS — I10 Essential (primary) hypertension: Secondary | ICD-10-CM | POA: Diagnosis not present

## 2017-01-27 MED ORDER — BUTALBITAL-APAP-CAFFEINE 50-325-40 MG PO TABS: 1.0000 | ORAL_TABLET | Freq: Three times a day (TID) | ORAL | 0 refills | Status: DC | PRN

## 2017-01-27 NOTE — Patient Instructions (Addendum)
Thank you for choosing [facility].  SUMMARY AND INSTRUCTIONS:  Labs:  Please stop by the lab on the lower level of the building for your blood work. Your results will be released to [place] (or called to you) after review, usually within 72 hours after test completion. If any changes need to be made, you will be notified at that same time.  1.) The lab is open from 7:30am to 5:30 pm Monday-Friday 2.) No appointment is necessary 3.) Fasting (if needed) is 6-8 hours after food and drink; black coffee and water are okay   Referrals:  Referrals have been made during this visit. You should expect to hear back from our schedulers in about 7-10 days in regards to establishing an appointment with the specialists we discussed.   Follow up:  If your symptoms worsen or fail to improve, please contact our office for further instruction, or in case of emergency go directly to the emergency room at the closest medical facility.    Health Maintenance  Topic Date Due  . HIV Screening  04/30/1981  . TETANUS/TDAP  04/30/1985  . COLONOSCOPY  04/30/2016  . INFLUENZA VACCINE  02/18/2017 (Originally 06/21/2016)    Health Maintenance, Male A healthy lifestyle and preventive care is important for your health and wellness. Ask your health care provider about what schedule of regular examinations is right for you. What should I know about weight and diet?  Eat a Healthy Diet  Eat plenty of vegetables, fruits, whole grains, low-fat dairy products, and lean protein.  Do not eat a lot of foods high in solid fats, added sugars, or salt. Maintain a Healthy Weight  Regular exercise can help you achieve or maintain a healthy weight. You should:  Do at least 150 minutes of exercise each week. The exercise should increase your heart rate and make you sweat (moderate-intensity exercise).  Do strength-training exercises at least twice a week. Watch Your Levels of Cholesterol and Blood Lipids  Have your  blood tested for lipids and cholesterol every 5 years starting at 51 years of age. If you are at high risk for heart disease, you should start having your blood tested when you are 51 years old. You may need to have your cholesterol levels checked more often if:  Your lipid or cholesterol levels are high.  You are older than 51 years of age.  You are at high risk for heart disease. What should I know about cancer screening? Many types of cancers can be detected early and may often be prevented. Lung Cancer  You should be screened every year for lung cancer if:  You are a current smoker who has smoked for at least 30 years.  You are a former smoker who has quit within the past 15 years.  Talk to your health care provider about your screening options, when you should start screening, and how often you should be screened. Colorectal Cancer  Routine colorectal cancer screening usually begins at 51 years of age and should be repeated every 5-10 years until you are 51 years old. You may need to be screened more often if early forms of precancerous polyps or small growths are found. Your health care provider may recommend screening at an earlier age if you have risk factors for colon cancer.  Your health care provider may recommend using home test kits to check for hidden blood in the stool.  A small camera at the end of a tube can be used to examine your  colon (sigmoidoscopy or colonoscopy). This checks for the earliest forms of colorectal cancer. Prostate and Testicular Cancer  Depending on your age and overall health, your health care provider may do certain tests to screen for prostate and testicular cancer.  Talk to your health care provider about any symptoms or concerns you have about testicular or prostate cancer. Skin Cancer  Check your skin from head to toe regularly.  Tell your health care provider about any new moles or changes in moles, especially if:  There is a change in a  mole's size, shape, or color.  You have a mole that is larger than a pencil eraser.  Always use sunscreen. Apply sunscreen liberally and repeat throughout the day.  Protect yourself by wearing long sleeves, pants, a wide-brimmed hat, and sunglasses when outside. What should I know about heart disease, diabetes, and high blood pressure?  If you are 70-33 years of age, have your blood pressure checked every 3-5 years. If you are 64 years of age or older, have your blood pressure checked every year. You should have your blood pressure measured twice-once when you are at a hospital or clinic, and once when you are not at a hospital or clinic. Record the average of the two measurements. To check your blood pressure when you are not at a hospital or clinic, you can use:  An automated blood pressure machine at a pharmacy.  A home blood pressure monitor.  Talk to your health care provider about your target blood pressure.  If you are between 53-87 years old, ask your health care provider if you should take aspirin to prevent heart disease.  Have regular diabetes screenings by checking your fasting blood sugar level.  If you are at a normal weight and have a low risk for diabetes, have this test once every three years after the age of 86.  If you are overweight and have a high risk for diabetes, consider being tested at a younger age or more often.  A one-time screening for abdominal aortic aneurysm (AAA) by ultrasound is recommended for men aged 45-75 years who are current or former smokers. What should I know about preventing infection? Hepatitis B  If you have a higher risk for hepatitis B, you should be screened for this virus. Talk with your health care provider to find out if you are at risk for hepatitis B infection. Hepatitis C  Blood testing is recommended for:  Everyone born from [birth year] through 1965.  Anyone with known risk factors for hepatitis C. Sexually Transmitted Diseases  (STDs)  You should be screened each year for STDs including gonorrhea and chlamydia if:  You are sexually active and are younger than 51 years of age.  You are older than 51 years of age and your health care provider tells you that you are at risk for this type of infection.  Your sexual activity has changed since you were last screened and you are at an increased risk for chlamydia or gonorrhea. Ask your health care provider if you are at risk.  Talk with your health care provider about whether you are at high risk of being infected with HIV. Your health care provider may recommend a prescription medicine to help prevent HIV infection. What else can I do?  Schedule regular health, dental, and eye exams.  Stay current with your vaccines (immunizations).  Do not use any tobacco products, such as cigarettes, chewing tobacco, and e-cigarettes. If you need help quitting, ask your health  care provider.  Limit alcohol intake to no more than 2 drinks per day. One drink equals 12 ounces of beer, 5 ounces of wine, or 1 ounces of hard liquor.  Do not use street drugs.  Do not share needles.  Ask your health care provider for help if you need support or information about quitting drugs.  Tell your health care provider if you often feel depressed.  Tell your health care provider if you have ever been abused or do not feel safe at home. This information is not intended to replace advice given to you by your health care provider. Make sure you discuss any questions you have with your health care provider. Document Released: 05/05/2008 Document Revised: 07/06/2016 Document Reviewed: 08/11/2015 Elsevier Interactive Patient Education  2017 Elsevier Inc.   Smoking Hazards Smoking cigarettes is extremely bad for your health. Tobacco smoke has over 200 known poisons in it. It contains the poisonous gases nitrogen oxide and carbon monoxide. There are over 60 chemicals in tobacco smoke that cause  cancer. Some of the chemicals found in cigarette smoke include:   Cyanide.   Benzene.   Formaldehyde.   Methanol (wood alcohol).   Acetylene (fuel used in welding torches).   Ammonia.  Even smoking lightly shortens your life expectancy by several years. You can greatly reduce the risk of medical problems for you and your family by stopping now. Smoking is the most preventable cause of death and disease in our society. Within days of quitting smoking, your circulation improves, you decrease the risk of having a heart attack, and your lung capacity improves. There may be some increased phlegm in the first few days after quitting, and it may take months for your lungs to clear up completely. Quitting for 10 years reduces your risk of developing lung cancer to almost that of a nonsmoker.  WHAT ARE THE RISKS OF SMOKING? Cigarette smokers have an increased risk of many serious medical problems, including:  Lung cancer.   Lung disease (such as pneumonia, bronchitis, and emphysema).   Heart attack and chest pain due to the heart not getting enough oxygen (angina).   Heart disease and peripheral blood vessel disease.   Hypertension.   Stroke.   Oral cancer (cancer of the lip, mouth, or voice box).   Bladder cancer.   Pancreatic cancer.   Cervical cancer.   Pregnancy complications, including premature birth.   Stillbirths and smaller newborn babies, birth defects, and genetic damage to sperm.   Early menopause.   Lower estrogen level for women.   Infertility.   Facial wrinkles.   Blindness.   Increased risk of broken bones (fractures).   Senile dementia.   Stomach ulcers and internal bleeding.   Delayed wound healing and increased risk of complications during surgery. Because of secondhand smoke exposure, children of smokers have an increased risk of the following:   Sudden infant death syndrome (SIDS).   Respiratory infections.   Lung  cancer.   Heart disease.   Ear infections.  WHY IS SMOKING ADDICTIVE? Nicotine is the chemical agent in tobacco that is capable of causing addiction or dependence. When you smoke and inhale, nicotine is absorbed rapidly into the bloodstream through your lungs. Both inhaled and noninhaled nicotine may be addictive.  WHAT ARE THE BENEFITS OF QUITTING?  There are many health benefits to quitting smoking. Some are:   The likelihood of developing cancer and heart disease decreases. Health improvements are seen almost immediately.   Blood pressure, pulse rate,  and breathing patterns start returning to normal soon after quitting.   People who quit may see an improvement in their overall quality of life.  HOW DO YOU QUIT SMOKING? Smoking is an addiction with both physical and psychological effects, and longtime habits can be hard to change. Your health care provider can recommend:  Programs and community resources, which may include group support, education, or therapy.  Replacement products, such as patches, gum, and nasal sprays. Use these products only as directed. Do not replace cigarette smoking with electronic cigarettes (commonly called e-cigarettes). The safety of e-cigarettes is unknown, and some may contain harmful chemicals. FOR MORE INFORMATION  American Lung Association: www.lung.org  American Cancer Society: www.cancer.org   This information is not intended to replace advice given to you by your health care provider. Make sure you discuss any questions you have with your health care provider.   Document Released: 12/15/2004 Document Revised: 08/28/2013 Document Reviewed: 04/29/2013 Elsevier Interactive Patient Education 2016 [employer].  Steps to Quit Smoking  Smoking tobacco can be harmful to your health and can affect almost every organ in your body. Smoking puts you, and those around you, at risk for developing many serious chronic diseases. Quitting smoking is  difficult, but it is one of the best things that you can do for your health. It is never too late to quit. WHAT ARE THE BENEFITS OF QUITTING SMOKING? When you quit smoking, you lower your risk of developing serious diseases and conditions, such as:  Lung cancer or lung disease, such as COPD.  Heart disease.  Stroke.  Heart attack.  Infertility.  Osteoporosis and bone fractures. Additionally, symptoms such as coughing, wheezing, and shortness of breath may get better when you quit. You may also find that you get sick less often because your body is stronger at fighting off colds and infections. If you are pregnant, quitting smoking can help to reduce your chances of having a baby of low birth weight. HOW DO I GET READY TO QUIT? When you decide to quit smoking, create a plan to make sure that you are successful. Before you quit:  Pick a date to quit. Set a date within the next two weeks to give you time to prepare.  Write down the reasons why you are quitting. Keep this list in places where you will see it often, such as on your bathroom mirror or in your car or wallet.  Identify the people, places, things, and activities that make you want to smoke (triggers) and avoid them. Make sure to take these actions:  Throw away all cigarettes at home, at work, and in your car.  Throw away smoking accessories, such as Scientist, research (medical).  Clean your car and make sure to empty the ashtray.  Clean your home, including curtains and carpets.  Tell your family, friends, and coworkers that you are quitting. Support from your loved ones can make quitting easier.  Talk with your health care provider about your options for quitting smoking.  Find out what treatment options are covered by your health insurance. WHAT STRATEGIES CAN I USE TO QUIT SMOKING?  Talk with your healthcare provider about different strategies to quit smoking. Some strategies include:  Quitting smoking altogether instead  of gradually lessening how much you smoke over a period of time. Research shows that quitting "cold [place]" is more successful than gradually quitting.  Attending in-person counseling to help you build problem-solving skills. You are more likely to have success in quitting if  you attend several counseling sessions. Even short sessions of 10 minutes can be effective.  Finding resources and support systems that can help you to quit smoking and remain smoke-free after you quit. These resources are most helpful when you use them often. They can include:  Online chats with a Social worker.  Telephone quitlines.  Printed Furniture conservator/restorer.  Support groups or group counseling.  Text messaging programs.  Mobile phone applications.  Taking medicines to help you quit smoking. (If you are pregnant or breastfeeding, talk with your health care provider first.) Some medicines contain nicotine and some do not. Both types of medicines help with cravings, but the medicines that include nicotine help to relieve withdrawal symptoms. Your health care provider may recommend:  Nicotine patches, gum, or lozenges.  Nicotine inhalers or sprays.  Non-nicotine medicine that is taken by mouth. Talk with your health care provider about combining strategies, such as taking medicines while you are also receiving in-person counseling. Using these two strategies together makes you more likely to succeed in quitting than if you used either strategy on its own. If you are pregnant or breastfeeding, talk with your health care provider about finding counseling or other support strategies to quit smoking. Do not take medicine to help you quit smoking unless told to do so by your health care provider. WHAT THINGS CAN I DO TO MAKE IT EASIER TO QUIT? Quitting smoking might feel overwhelming at first, but there is a lot that you can do to make it easier. Take these important actions:  Reach out to your family and friends and ask  that they support and encourage you during this time. Call telephone quitlines, reach out to support groups, or work with a counselor for support.  Ask people who smoke to avoid smoking around you.  Avoid places that trigger you to smoke, such as bars, parties, or smoke-break areas at work.  Spend time around people who do not smoke.  Lessen stress in your life, because stress can be a smoking trigger for some people. To lessen stress, try:  Exercising regularly.  Deep-breathing exercises.  Yoga.  Meditating.  Performing a body scan. This involves closing your eyes, scanning your body from head to toe, and noticing which parts of your body are particularly tense. Purposefully relax the muscles in those areas.  Download or purchase mobile phone or tablet apps (applications) that can help you stick to your quit plan by providing reminders, tips, and encouragement. There are many free apps, such as QuitGuide from the [employer] Office manager for Disease Control and Prevention). You can find other support for quitting smoking (smoking cessation) through smokefree.gov and other websites. HOW WILL I FEEL WHEN I QUIT SMOKING? Within the first 24 hours of quitting smoking, you may start to feel some withdrawal symptoms. These symptoms are usually most noticeable 2-3 days after quitting, but they usually do not last beyond 2-3 weeks. Changes or symptoms that you might experience include:  Mood swings.  Restlessness, anxiety, or irritation.  Difficulty concentrating.  Dizziness.  Strong cravings for sugary foods in addition to nicotine.  Mild weight gain.  Constipation.  Nausea.  Coughing or a sore throat.  Changes in how your medicines work in your body.  A depressed mood.  Difficulty sleeping (insomnia). After the first 2-3 weeks of quitting, you may start to notice more positive results, such as:  Improved sense of smell and taste.  Decreased coughing and sore throat.  Slower heart  rate.  Lower blood pressure.  Clearer skin.  The ability to breathe more easily.  Fewer sick days. Quitting smoking is very challenging for most people. Do not get discouraged if you are not successful the first time. Some people need to make many attempts to quit before they achieve long-term success. Do your best to stick to your quit plan, and talk with your health care provider if you have any questions or concerns.   This information is not intended to replace advice given to you by your health care provider. Make sure you discuss any questions you have with your health care provider.   Document Released: 11/01/2001 Document Revised: 03/24/2015 Document Reviewed: 03/24/2015 Elsevier Interactive Patient Education 2016 [employer].  Smoking Cessation, Tips for Success If you are ready to quit smoking, congratulations! You have chosen to help yourself be healthier. Cigarettes bring nicotine, tar, carbon monoxide, and other irritants into your body. Your lungs, heart, and blood vessels will be able to work better without these poisons. There are many different ways to quit smoking. Nicotine gum, nicotine patches, a nicotine inhaler, or nicotine nasal spray can help with physical craving. Hypnosis, support groups, and medicines help break the habit of smoking. WHAT THINGS CAN I DO TO MAKE QUITTING EASIER?  Here are some tips to help you quit for good:  Pick a date when you will quit smoking completely. Tell all of your friends and family about your plan to quit on that date.  Do not try to slowly cut down on the number of cigarettes you are smoking. Pick a quit date and quit smoking completely starting on that day.  Throw away all cigarettes.   Clean and remove all ashtrays from your home, work, and car.  On a card, write down your reasons for quitting. Carry the card with you and read it when you get the urge to smoke.  Cleanse your body of nicotine. Drink enough water and fluids  to keep your urine clear or pale yellow. Do this after quitting to flush the nicotine from your body.  Learn to predict your moods. Do not let a bad situation be your excuse to have a cigarette. Some situations in your life might tempt you into wanting a cigarette.  Never have "just one" cigarette. It leads to wanting another and another. Remind yourself of your decision to quit.  Change habits associated with smoking. If you smoked while driving or when feeling stressed, try other activities to replace smoking. Stand up when drinking your coffee. Brush your teeth after eating. Sit in a different chair when you read the paper. Avoid alcohol while trying to quit, and try to drink fewer caffeinated beverages. Alcohol and caffeine may urge you to smoke.  Avoid foods and drinks that can trigger a desire to smoke, such as sugary or spicy foods and alcohol.  Ask people who smoke not to smoke around you.  Have something planned to do right after eating or having a cup of coffee. For example, plan to take a walk or exercise.  Try a relaxation exercise to calm you down and decrease your stress. Remember, you may be tense and nervous for the first 2 weeks after you quit, but this will pass.  Find new activities to keep your hands busy. Play with a pen, coin, or rubber band. Doodle or draw things on paper.  Brush your teeth right after eating. This will help cut down on the craving for the taste of tobacco after meals. You can also try mouthwash.  Use oral substitutes in place of cigarettes. Try using lemon drops, carrots, cinnamon sticks, or chewing gum. Keep them handy so they are available when you have the urge to smoke.  When you have the urge to smoke, try deep breathing.  Designate your home as a nonsmoking area.  If you are a heavy smoker, ask your health care provider about a prescription for nicotine chewing gum. It can ease your withdrawal from nicotine.  Reward yourself. Set aside the  cigarette money you save and buy yourself something nice.  Look for support from others. Join a support group or smoking cessation program. Ask someone at home or at work to help you with your plan to quit smoking.  Always ask yourself, "Do I need this cigarette or is this just a reflex?" Tell yourself, "Today, I choose not to smoke," or "I do not want to smoke." You are reminding yourself of your decision to quit.  Do not replace cigarette smoking with electronic cigarettes (commonly called e-cigarettes). The safety of e-cigarettes is unknown, and some may contain harmful chemicals.  If you relapse, do not give up! Plan ahead and think about what you will do the next time you get the urge to smoke. HOW WILL I FEEL WHEN I QUIT SMOKING? You may have symptoms of withdrawal because your body is used to nicotine (the addictive substance in cigarettes). You may crave cigarettes, be irritable, feel very hungry, cough often, get headaches, or have difficulty concentrating. The withdrawal symptoms are only temporary. They are strongest when you first quit but will go away within 10-14 days. When withdrawal symptoms occur, stay in control. Think about your reasons for quitting. Remind yourself that these are signs that your body is healing and getting used to being without cigarettes. Remember that withdrawal symptoms are easier to treat than the major diseases that smoking can cause.  Even after the withdrawal is over, expect periodic urges to smoke. However, these cravings are generally short lived and will go away whether you smoke or not. Do not smoke! WHAT RESOURCES ARE AVAILABLE TO HELP ME QUIT SMOKING? Your health care provider can direct you to community resources or hospitals for support, which may include:  Group support.  Education.  Hypnosis.  Therapy.   This information is not intended to replace advice given to you by your health care provider. Make sure you discuss any questions you have  with your health care provider.   Document Released: 08/05/2004 Document Revised: 11/28/2014 Document Reviewed: 04/25/2013 Elsevier Interactive Patient Education [employer].

## 2017-01-27 NOTE — Assessment & Plan Note (Addendum)
Reviewed and updated patient's medical, surgical, family and social history. Medications and allergies were also reviewed. Basic screenings for depression, activities of daily living, hearing, cognition and safety were performed. Provider list was updated and health plan was provided to the patient.   Declines tetanus. All other immunizations are up-to-date per recommendations. Due for colon cancer screening with referral placed to gastroenterology. Obtain PSA for prostate cancer screening. All other screenings are up-to-date per recommendations.  Overall well exam with risk factors for cardiovascular disease including hyperlipidemia, tobacco use, and hypertension. Chronic conditions appear adequately managed through medication regimen. Encouraged tobacco cessation and not related quit at this time. Working to increase physical activity. Continue other healthy lifestyle behaviors and choices. Follow up prevention exam in 1 year. Follow up office visit for chronic conditions.

## 2017-01-27 NOTE — Assessment & Plan Note (Signed)
Continues tobacco use and is in the precontemplation stages of change with no desire to quit smoking at this time. Discussed importance of tobacco cessation to decrease risk for cardiovascular, respiratory, or malignancy. Continue to monitor.

## 2017-01-27 NOTE — Progress Notes (Signed)
Subjective:    Patient ID: Jeremiah Anderson, male    DOB: 11-Nov-1966, 51 y.o.   MRN: [id number]  Chief Complaint  Patient presents with  . CPE    not fasting    HPI:  Jeremiah Anderson is a 51 y.o. male who presents today for a Medicare Annual Wellness/Physical exam.    1) Health Maintenance -   Diet - Averaging about 2-3 meals per day consisting of a regular diet; Caffeine intake of 6-8 cups per day  Exercise - No structured exercise; working on increasing it   2) Preventative Exams / Immunizations:  Dental -- Due for exam  Vision -- Up to date    Health Maintenance  Topic Date Due  . HIV Screening  04/30/1981  . TETANUS/TDAP  04/30/1985  . COLONOSCOPY  04/30/2016  . INFLUENZA VACCINE  02/18/2017 (Originally 06/21/2016)     There is no immunization history for the selected administration types on file for this patient.  RISK FACTORS  Tobacco History  Smoking Status  . Current Every Day Smoker  . Packs/day: 1.00  . Years: 24.00  . Types: Cigarettes  Smokeless Tobacco  . Never Used     Cardiac risk factors: dyslipidemia, hypertension, male gender and smoking/ tobacco exposure.  Depression Screen  No flowsheet data found.   Activities of Daily Living In your present state of health, do you have any difficulty performing the following activities?:  Driving? No Managing money?  No Feeding yourself? No Getting from bed to chair? No Climbing a flight of stairs? No Preparing food and eating?: No Bathing or showering? No Getting dressed: No Getting to the toilet? No Using the toilet: No Moving around from place to place: No In the past year have you fallen or had a near fall?:No   Home Safety Has smoke detector and wears seat belts. No firearms. No excess sun exposure. Are there smokers in your home (other than you)?  No Do you feel safe at home?  Yes  Hearing Difficulties: No Do you often ask people to speak up or repeat themselves? No Do you  experience ringing or noises in your ears? No  Do you have difficulty understanding soft or whispered voices? No    Cognitive Testing  Alert? Yes   Normal Appearance? Yes  Oriented to person? Yes  Place? Yes   Time? Yes  Recall of three objects?  Yes  Can perform simple calculations? Yes  Displays appropriate judgment? Yes  Can read the correct time from a watch face? Yes  Do you feel that you have a problem with memory? No  Do you often misplace items? No   Advanced Directives have been discussed with the patient? Yes   Current Physicians/Providers and Suppliers  1. Terri Piedra, FNP - Internal Medicine 2. Nicholaus Bloom, MD - Pain Management 3. Irine Seal, MD - Urologist  4. Talbot Grumbling, MD - General Surgery 5. Celesta Gentile, DPM - Podiatry  Indicate any recent Medical Services you may have received from other than [facility] providers in the past year (date may be approximate).  All answers were reviewed with the patient and necessary referrals were made:  [name], FNP   01/27/2017    Allergies  Allergen Reactions  . Fentanyl Other (See Comments)    Hallucinations (with the patch)   . Lisinopril Swelling  . Lyrica [Pregabalin]     hallucinations  . Sulfa Antibiotics Swelling     Outpatient Medications Prior to Visit  Medication Sig  Dispense Refill  . amLODipine (NORVASC) 10 MG tablet TAKE ONE TABLET BY MOUTH ONCE DAILY 90 tablet 1  . Coenzyme Q10 (COQ10) 100 MG CAPS Take 100 mg by mouth daily.    . fluticasone (FLONASE) 50 MCG/ACT nasal spray Place 2 sprays into both nostrils daily. (Patient taking differently: Place 2 sprays into both nostrils daily as needed for allergies. ) 16 g 6  . Lidocaine 4 % PTCH Place 1-2 patches onto the skin 2 (two) times daily as needed (for pain.). Salonpas Maximum Strength Pain Relieving Gel-Patch    . methocarbamol (ROBAXIN) 750 MG tablet Take 750 mg by mouth every 6 (six) hours as needed for muscle spasms.     Marland Kitchen morphine  (MSIR) 30 MG tablet Take 30 mg by mouth every 3 (three) hours. 8 TIMES A DAY    . Multiple Vitamin (MULTIVITAMIN WITH MINERALS) TABS tablet Take 1 tablet by mouth daily.    . Omega-3 Fatty Acids (OMEGA 3 PO) Take 120 mg by mouth daily.    . ondansetron (ZOFRAN) 4 MG tablet TAKE ONE TABLET BY MOUTH EVERY 8 HOURS AS NEEDED FOR NAUSEA OR VOMITING 20 tablet 0  . Red Yeast Rice Extract (RED YEAST RICE PO) Take 1 capsule by mouth daily.    . butalbital-acetaminophen-caffeine (FIORICET, ESGIC) 50-325-40 MG tablet TAKE ONE TABLET BY MOUTH THREE TIMES A DAY AS NEEDED 90 tablet 0  . mupirocin ointment (BACTROBAN) 2 % APPLY OINTMENT INTO THE NOSE TWO TIMES DAILY 22 g 0  . traMADol (ULTRAM) 50 MG tablet Take 1 tablet (50 mg total) by mouth every 6 (six) hours as needed. 30 tablet 0   No facility-administered medications prior to visit.      Past Medical History:  Diagnosis Date  . Arthritis   . GERD (gastroesophageal reflux disease)   . Hyperlipidemia   . Hypertension   . Migraines   . Wears glasses      Past Surgical History:  Procedure Laterality Date  . APPENDECTOMY  1980s   post op bleeding sent him back for ex lap and hemostatic intervention.   Marland Kitchen BACK SURGERY     8 surgeries total - fused from L1-S1  . COLONOSCOPY    . ESOPHAGOGASTRODUODENOSCOPY N/A 06/10/2015   Procedure: ESOPHAGOGASTRODUODENOSCOPY (EGD);  Surgeon: Lafayette Dragon, MD;  Location: [facility] ENDOSCOPY;  Service: Endoscopy;  Laterality: N/A;  . ex lap with peri gastric vessel ligation  1990s   after retching, he rupture a blood vessel "outside" the stomach.  vessel was ligated per pt's descriiption.   Fatima Blank HERNIA REPAIR Right 10/21/2016   Procedure: OPEN FLANK HERNIA REPAIR WITH MESH;  Surgeon: Ralene Ok, MD;  Location: [place];  Service: General;  Laterality: Right;  . INSERTION OF MESH Right 10/21/2016   Procedure: INSERTION OF MESH;  Surgeon: Ralene Ok, MD;  Location: [place];  Service: General;  Laterality:  Right;  . KNEE SURGERY     Multiple bilateral scopes  . RETINAL DETACHMENT SURGERY Right   . SHOULDER SURGERY     4x on the left - total shoulder replacement.   Marland Kitchen SHOULDER SURGERY Left    x2  . TOTAL SHOULDER ARTHROPLASTY Left    x2     Family History  Problem Relation Age of Onset  . Arthritis Mother   . Hyperlipidemia Mother   . Hypertension Father      Social History   Social History  . Marital status: Unknown    Spouse name: N/A  .  Number of children: 0  . Years of education: 31   Occupational History  . Disability     Former Therapist, sports   Social History Main Topics  . Smoking status: Current Every Day Smoker    Packs/day: 1.00    Years: 24.00    Types: Cigarettes  . Smokeless tobacco: Never Used  . Alcohol use 0.0 oz/week     Comment: rarely  . Drug use: No  . Sexual activity: Not on file   Other Topics Concern  . Not on file   Social History Narrative   Fun: play guitar / limited funds      Denies any religious beliefs effecting health care.      Review of Systems  Constitutional: Denies fever, chills, fatigue, or significant weight gain/loss. HENT: Head: Denies headache or neck pain Ears: Denies changes in hearing, ringing in ears, earache, drainage Nose: Denies discharge, stuffiness, itching, nosebleed, sinus pain Throat: Denies sore throat, hoarseness, dry mouth, sores, thrush Eyes: Denies loss/changes in vision, pain, redness, blurry/double vision, flashing lights Cardiovascular: Denies chest pain/discomfort, tightness, palpitations, shortness of breath with activity, difficulty lying down, swelling, sudden awakening with shortness of breath Respiratory: Denies shortness of breath, cough, sputum production, wheezing Gastrointestinal: Denies dysphasia, heartburn, change in appetite, nausea, change in bowel habits, rectal bleeding, constipation, diarrhea, yellow skin or eyes Genitourinary: Denies frequency, urgency, burning/pain, blood in urine,  incontinence, change in urinary strength. Musculoskeletal: Denies muscle/joint pain, stiffness, back pain, redness or swelling of joints, trauma Skin: Denies rashes, lumps, itching, dryness, color changes, or hair/nail changes Neurological: Denies dizziness, fainting, seizures, weakness, numbness, tingling, tremor Psychiatric - Denies nervousness, stress, depression or memory loss Endocrine: Denies heat or cold intolerance, sweating, frequent urination, excessive thirst, changes in appetite Hematologic: Denies ease of bruising or bleeding    Objective:     BP 136/82 (BP Location: Left Arm, Patient Position: Sitting, Cuff Size: Large)   Pulse 79   Temp 99 F (37.2 C) (Oral)   Resp 16   Ht 5\' 11"  (1.803 m)   Wt 169 lb (76.7 kg)   SpO2 96%   BMI 23.57 kg/m  Nursing note and vital signs reviewed.  Physical Exam  Constitutional: He is oriented to person, place, and time. He appears well-developed and well-nourished.  HENT:  Head: Normocephalic.  Right Ear: Hearing, tympanic membrane, external ear and ear canal normal.  Left Ear: Hearing, tympanic membrane, external ear and ear canal normal.  Nose: Nose normal.  Mouth/Throat: Uvula is midline, oropharynx is clear and moist and mucous membranes are normal.  Eyes: Conjunctivae and EOM are normal. Pupils are equal, round, and reactive to light.  Neck: Neck supple. No JVD present. No tracheal deviation present. No thyromegaly present.  Cardiovascular: Normal rate, regular rhythm, normal heart sounds and intact distal pulses.   Pulmonary/Chest: Effort normal and breath sounds normal.  Abdominal: Soft. Bowel sounds are normal. He exhibits no distension and no mass. There is no tenderness. There is no rebound and no guarding.  Musculoskeletal: Normal range of motion. He exhibits no edema or tenderness.  Lymphadenopathy:    He has no cervical adenopathy.  Neurological: He is alert and oriented to person, place, and time. He has normal  reflexes. No cranial nerve deficit. He exhibits normal muscle tone. Coordination normal.  Skin: Skin is warm and dry.  Psychiatric: He has a normal mood and affect. His behavior is normal. Judgment and thought content normal.       Assessment & Plan:  During the course of the visit the patient was educated and counseled about appropriate screening and preventive services including:    Pneumococcal vaccine   Influenza vaccine  Prostate cancer screening  Colorectal cancer screening  Diabetes screening  Glaucoma screening  Nutrition counseling   Smoking cessation counseling  Diet review for nutrition referral? Yes ____  Not Indicated _X___   Patient Instructions (the written plan) was given to the patient.  Medicare Attestation I have personally reviewed: The patient's medical and social history Their use of alcohol, tobacco or illicit drugs Their current medications and supplements The patient's functional ability including ADLs,fall risks, home safety risks, cognitive, and hearing and visual impairment Diet and physical activities Evidence for depression or mood disorders  The patient's weight, height, BMI,  have been recorded in the chart.  I have made referrals, counseling, and provided education to the patient based on review of the above and I have provided the patient with a written personalized care plan for preventive services.     Problem List Items Addressed This Visit      Cardiovascular and Mediastinum   Essential hypertension    Blood pressure below goal 140/90 with current medication regimen and no adverse side effects. Denies worse headache of life with no new symptoms of end organ damage noted on physical exam. Continue current dosage of amlodipine. Encouraged to monitor blood pressure at home and follow low-sodium diet.        Other   Hyperlipidemia    Not currently maintained on medication. Obtain lipid profile. Continue lifestyle management  pending lipid profile results.      Relevant Orders   CBC   Comprehensive metabolic panel   Lipid panel   Tobacco use disorder    Continues tobacco use and is in the precontemplation stages of change with no desire to quit smoking at this time. Discussed importance of tobacco cessation to decrease risk for cardiovascular, respiratory, or malignancy. Continue to monitor.      Medicare annual wellness visit, subsequent - Primary    Reviewed and updated patient's medical, surgical, family and social history. Medications and allergies were also reviewed. Basic screenings for depression, activities of daily living, hearing, cognition and safety were performed. Provider list was updated and health plan was provided to the patient.   Declines tetanus. All other immunizations are up-to-date per recommendations. Due for colon cancer screening with referral placed to gastroenterology. Obtain PSA for prostate cancer screening. All other screenings are up-to-date per recommendations.  Overall well exam with risk factors for cardiovascular disease including hyperlipidemia, tobacco use, and hypertension. Chronic conditions appear adequately managed through medication regimen. Encouraged tobacco cessation and not related quit at this time. Working to increase physical activity. Continue other healthy lifestyle behaviors and choices. Follow up prevention exam in 1 year. Follow up office visit for chronic conditions.        Other Visit Diagnoses    Screening for prostate cancer       Relevant Orders   PSA, Medicare   Special screening for malignant neoplasm of colon       Relevant Orders   Ambulatory referral to Gastroenterology       I have discontinued Mr. Krantz traMADol and mupirocin ointment. I have also changed his butalbital-acetaminophen-caffeine. Additionally, I am having him maintain his morphine, methocarbamol, fluticasone, Lidocaine, CoQ10, Red Yeast Rice Extract (RED YEAST RICE PO),  Omega-3 Fatty Acids (OMEGA 3 PO), multivitamin with minerals, ondansetron, and amLODipine.   Meds ordered this encounter  Medications  . butalbital-acetaminophen-caffeine (FIORICET, ESGIC) 50-325-40 MG tablet    Sig: Take 1 tablet by mouth 3 (three) times daily as needed.    Dispense:  90 tablet    Refill:  0    Order Specific Question:   Supervising Provider    Answer:   Pricilla Holm A [[id number]]     Follow-up: Return if symptoms worsen or fail to improve.   [name], FNP

## 2017-01-27 NOTE — Assessment & Plan Note (Signed)
Blood pressure below goal 140/90 with current medication regimen and no adverse side effects. Denies worse headache of life with no new symptoms of end organ damage noted on physical exam. Continue current dosage of amlodipine. Encouraged to monitor blood pressure at home and follow low-sodium diet.

## 2017-01-27 NOTE — Assessment & Plan Note (Signed)
Not currently maintained on medication. Obtain lipid profile. Continue lifestyle management pending lipid profile results.

## 2017-01-31 ENCOUNTER — Encounter: Payer: Self-pay | Admitting: Gastroenterology

## 2017-01-31 DIAGNOSIS — L309 Dermatitis, unspecified: Secondary | ICD-10-CM | POA: Diagnosis not present

## 2017-01-31 DIAGNOSIS — I780 Hereditary hemorrhagic telangiectasia: Secondary | ICD-10-CM | POA: Diagnosis not present

## 2017-01-31 DIAGNOSIS — L578 Other skin changes due to chronic exposure to nonionizing radiation: Secondary | ICD-10-CM | POA: Diagnosis not present

## 2017-01-31 DIAGNOSIS — L814 Other melanin hyperpigmentation: Secondary | ICD-10-CM | POA: Diagnosis not present

## 2017-02-01 ENCOUNTER — Ambulatory Visit (INDEPENDENT_AMBULATORY_CARE_PROVIDER_SITE_OTHER): Payer: Medicare Other | Admitting: Psychology

## 2017-02-01 DIAGNOSIS — F901 Attention-deficit hyperactivity disorder, predominantly hyperactive type: Secondary | ICD-10-CM | POA: Diagnosis not present

## 2017-02-08 ENCOUNTER — Ambulatory Visit (INDEPENDENT_AMBULATORY_CARE_PROVIDER_SITE_OTHER): Payer: Medicare Other | Admitting: Psychology

## 2017-02-08 DIAGNOSIS — F901 Attention-deficit hyperactivity disorder, predominantly hyperactive type: Secondary | ICD-10-CM

## 2017-02-10 DIAGNOSIS — M961 Postlaminectomy syndrome, not elsewhere classified: Secondary | ICD-10-CM | POA: Diagnosis not present

## 2017-02-10 DIAGNOSIS — Z79891 Long term (current) use of opiate analgesic: Secondary | ICD-10-CM | POA: Diagnosis not present

## 2017-02-10 DIAGNOSIS — F4323 Adjustment disorder with mixed anxiety and depressed mood: Secondary | ICD-10-CM | POA: Diagnosis not present

## 2017-02-10 DIAGNOSIS — G894 Chronic pain syndrome: Secondary | ICD-10-CM | POA: Diagnosis not present

## 2017-02-15 ENCOUNTER — Ambulatory Visit (INDEPENDENT_AMBULATORY_CARE_PROVIDER_SITE_OTHER): Payer: Medicare Other | Admitting: Psychology

## 2017-02-15 DIAGNOSIS — F901 Attention-deficit hyperactivity disorder, predominantly hyperactive type: Secondary | ICD-10-CM | POA: Diagnosis not present

## 2017-02-21 ENCOUNTER — Other Ambulatory Visit: Payer: Self-pay | Admitting: Family

## 2017-02-22 ENCOUNTER — Ambulatory Visit (INDEPENDENT_AMBULATORY_CARE_PROVIDER_SITE_OTHER): Payer: Medicare Other | Admitting: Psychology

## 2017-02-22 DIAGNOSIS — F901 Attention-deficit hyperactivity disorder, predominantly hyperactive type: Secondary | ICD-10-CM | POA: Diagnosis not present

## 2017-02-23 ENCOUNTER — Other Ambulatory Visit (INDEPENDENT_AMBULATORY_CARE_PROVIDER_SITE_OTHER): Payer: Medicare Other

## 2017-02-23 ENCOUNTER — Other Ambulatory Visit: Payer: Self-pay | Admitting: Family

## 2017-02-23 DIAGNOSIS — E782 Mixed hyperlipidemia: Secondary | ICD-10-CM

## 2017-02-23 DIAGNOSIS — Z125 Encounter for screening for malignant neoplasm of prostate: Secondary | ICD-10-CM

## 2017-02-23 DIAGNOSIS — E291 Testicular hypofunction: Secondary | ICD-10-CM | POA: Diagnosis not present

## 2017-02-23 LAB — COMPREHENSIVE METABOLIC PANEL
ALBUMIN: 4.5 g/dL (ref 3.5–5.2)
ALK PHOS: 70 U/L (ref 39–117)
ALT: 16 U/L (ref 0–53)
AST: 16 U/L (ref 0–37)
BILIRUBIN TOTAL: 0.5 mg/dL (ref 0.2–1.2)
BUN: 10 mg/dL (ref 6–23)
CALCIUM: 9.6 mg/dL (ref 8.4–10.5)
CO2: 32 mEq/L (ref 19–32)
CREATININE: 0.74 mg/dL (ref 0.40–1.50)
Chloride: 103 mEq/L (ref 96–112)
GFR: 118.6 mL/min (ref >60.00)
Glucose, Bld: 114 mg/dL — ABNORMAL HIGH (ref 70–99)
Potassium: 5.1 mEq/L (ref 3.5–5.1)
Sodium: 139 mEq/L (ref 135–145)
TOTAL PROTEIN: 6.8 g/dL (ref 6.0–8.3)

## 2017-02-23 LAB — LIPID PANEL
CHOLESTEROL: 298 mg/dL — AB (ref 0–200)
HDL: 55.7 mg/dL (ref >39.00)
NonHDL: 241.93
TRIGLYCERIDES: 202 mg/dL — AB (ref 0.0–149.0)
Total CHOL/HDL Ratio: 5
VLDL: 40.4 mg/dL — ABNORMAL HIGH (ref 0.0–40.0)

## 2017-02-23 LAB — CBC
HCT: 41.6 % (ref 39.0–52.0)
HEMOGLOBIN: 14.1 g/dL (ref 13.0–17.0)
MCHC: 34 g/dL (ref 30.0–36.0)
MCV: 88 fl (ref 78.0–100.0)
PLATELETS: 279 10*3/uL (ref 150.0–400.0)
RBC: 4.73 Mil/uL (ref 4.22–5.81)
RDW: 13.4 % (ref 11.5–15.5)
WBC: 8.7 10*3/uL (ref 4.0–10.5)

## 2017-02-23 LAB — PSA, MEDICARE: PSA: 0.35 ng/ml (ref 0.10–4.00)

## 2017-02-23 LAB — LDL CHOLESTEROL, DIRECT: Direct LDL: 180 mg/dL

## 2017-02-27 NOTE — Telephone Encounter (Addendum)
Patient is calling back to get refill.  [facility] in [facility]

## 2017-02-28 DIAGNOSIS — L251 Unspecified contact dermatitis due to drugs in contact with skin: Secondary | ICD-10-CM | POA: Diagnosis not present

## 2017-02-28 MED ORDER — BUTALBITAL-APAP-CAFFEINE 50-325-40 MG PO TABS: 1.0000 | ORAL_TABLET | Freq: Three times a day (TID) | ORAL | 0 refills | Status: DC | PRN

## 2017-02-28 NOTE — Telephone Encounter (Signed)
Please advise. Last refill was 01/27/17

## 2017-02-28 NOTE — Addendum Note (Signed)
Addended by: [name] D on: 02/28/2017 09:49 AM   Modules accepted: Orders

## 2017-02-28 NOTE — Telephone Encounter (Signed)
Medication refilled

## 2017-03-01 ENCOUNTER — Ambulatory Visit (INDEPENDENT_AMBULATORY_CARE_PROVIDER_SITE_OTHER): Payer: Medicare Other | Admitting: Psychology

## 2017-03-01 DIAGNOSIS — F901 Attention-deficit hyperactivity disorder, predominantly hyperactive type: Secondary | ICD-10-CM | POA: Diagnosis not present

## 2017-03-02 DIAGNOSIS — E291 Testicular hypofunction: Secondary | ICD-10-CM | POA: Diagnosis not present

## 2017-03-08 ENCOUNTER — Ambulatory Visit (INDEPENDENT_AMBULATORY_CARE_PROVIDER_SITE_OTHER): Payer: Medicare Other | Admitting: Psychology

## 2017-03-08 DIAGNOSIS — F901 Attention-deficit hyperactivity disorder, predominantly hyperactive type: Secondary | ICD-10-CM | POA: Diagnosis not present

## 2017-03-10 DIAGNOSIS — L0100 Impetigo, unspecified: Secondary | ICD-10-CM | POA: Diagnosis not present

## 2017-03-10 DIAGNOSIS — M961 Postlaminectomy syndrome, not elsewhere classified: Secondary | ICD-10-CM | POA: Diagnosis not present

## 2017-03-10 DIAGNOSIS — G894 Chronic pain syndrome: Secondary | ICD-10-CM | POA: Diagnosis not present

## 2017-03-10 DIAGNOSIS — R51 Headache: Secondary | ICD-10-CM | POA: Diagnosis not present

## 2017-03-10 DIAGNOSIS — Z79891 Long term (current) use of opiate analgesic: Secondary | ICD-10-CM | POA: Diagnosis not present

## 2017-03-15 ENCOUNTER — Ambulatory Visit (INDEPENDENT_AMBULATORY_CARE_PROVIDER_SITE_OTHER): Payer: Medicare Other

## 2017-03-15 ENCOUNTER — Ambulatory Visit (INDEPENDENT_AMBULATORY_CARE_PROVIDER_SITE_OTHER): Payer: Medicare Other | Admitting: Specialist

## 2017-03-15 ENCOUNTER — Encounter (INDEPENDENT_AMBULATORY_CARE_PROVIDER_SITE_OTHER): Payer: Self-pay | Admitting: Specialist

## 2017-03-15 ENCOUNTER — Ambulatory Visit (INDEPENDENT_AMBULATORY_CARE_PROVIDER_SITE_OTHER): Payer: Medicare Other | Admitting: Psychology

## 2017-03-15 VITALS — BP 134/84 | HR 79 | Ht 71.0 in | Wt 177.0 lb

## 2017-03-15 DIAGNOSIS — M4000 Postural kyphosis, site unspecified: Secondary | ICD-10-CM

## 2017-03-15 DIAGNOSIS — M47814 Spondylosis without myelopathy or radiculopathy, thoracic region: Secondary | ICD-10-CM

## 2017-03-15 DIAGNOSIS — F901 Attention-deficit hyperactivity disorder, predominantly hyperactive type: Secondary | ICD-10-CM | POA: Diagnosis not present

## 2017-03-15 NOTE — Progress Notes (Addendum)
Office Visit Note   Patient: Jeremiah Anderson           Date of Birth: Sep 02, 1966           MRN: [id number] Visit Date: 03/15/2017              Requested by: [address], [place], [place] [address] PCP: [name], FNP   Assessment & Plan: Visit Diagnoses:  1. Spondylosis of thoracic region without myelopathy or radiculopathy   2. Kyphosis (acquired) (postural)   Multiple complains of thoracolumbar and right flank pain but clinically no focal deficit. Plain radiographs with L1-S1 lumbar fusion with no lordosis, But also has a significantly hypokyphotic dorsal spine so perhaps his over all sagitall alignment balances. I would need scoliosis long cassette views to tell if balance is a problem. In either case he wonders if he could do part time work. He has significant education. Notes that his mother returned to work helping other people with their appointments I think he might start with some volunteer work and build a resume and consider part time employment realizing that [employer] will only allow a certain income beyond that he recieves before he loses his benefits of  Disabiliy income. He is totally disabled and as fas as I'm concerned if  He wants to try part time employment it may improve his overall status. For now with have him reenter PT with Art [facility] At [facility] to work on core strengthening post reconstructive right flank hernia repair that was successful in improving his right upper quadrant abdomenal wall Integrity and may now improve his core support structures.  Plan: Avoid frequent bending and stooping  No lifting greater than 10 lbs. May use ice or moist heat for pain. Weight loss is of benefit. Handicap license is approved.     Follow-Up Instructions: Return in about 6 months (around 09/14/2017).   Orders:  Orders Placed This Encounter  Procedures  . XR Thoracic Spine 2 View   No orders of the defined types were placed in this  encounter.     Procedures: No procedures performed   Clinical Data: No additional findings.   Subjective: Chief Complaint  Patient presents with  . Middle Back - Follow-up    51 year old male with long history of back pain status post L1-S1 fusion with multiple level anterior lumbar fusions and posterior instrumentation. He underwent an open right flank Hernia repair in 10/2016 and this has healed. Now is wanting to restart physical therapy. He has been to [place] with Art S. And wishing to restart a program of core strength. Seeing Dr. Hardin Negus in [facility] for chronic pain management. He reports that his general surgeon has okayed a return to PT.    Review of Systems  Constitutional: Negative.   HENT: Negative.   Eyes: Negative.   Respiratory: Negative.   Cardiovascular: Negative.   Gastrointestinal: Negative.   Endocrine: Negative.   Genitourinary: Negative.   Musculoskeletal: Negative.   Skin: Negative.   Allergic/Immunologic: Negative.   Neurological: Negative.   Hematological: Negative.   Psychiatric/Behavioral: Negative.      Objective: Vital Signs: BP 134/84 (BP Location: Left Arm, Patient Position: Sitting)   Pulse 79   Ht 5\' 11"  (1.803 m)   Wt 177 lb (80.3 kg)   BMI 24.69 kg/m   Physical Exam  Constitutional: He is oriented to person, place, and time. He appears well-developed and well-nourished.  HENT:  Head:  Normocephalic and atraumatic.  Eyes: EOM are normal. Pupils are equal, round, and reactive to light.  Neck: Normal range of motion. Neck supple.  Pulmonary/Chest: Effort normal and breath sounds normal.  Abdominal: Soft. Bowel sounds are normal.  Neurological: He is alert and oriented to person, place, and time.  Skin: Skin is warm and dry.  Psychiatric: He has a normal mood and affect. His behavior is normal. Judgment and thought content normal.    Back Exam   Tenderness  The patient is experiencing tenderness in  the lumbar.  Range of Motion  Extension: abnormal  Flexion: normal  Lateral Bend Right: abnormal  Lateral Bend Left: abnormal  Rotation Right: abnormal  Rotation Left: abnormal   Muscle Strength  Right Quadriceps:  5/5  Left Quadriceps:  5/5  Right Hamstrings:  5/5  Left Hamstrings:  5/5   Tests  Straight leg raise right: negative Straight leg raise left: negative  Reflexes  Patellar: normal Achilles: normal Babinski's sign: normal   Other  Toe Walk: normal Heel Walk: normal Sensation: normal Gait: normal  Erythema: no back redness Scars: present  Comments:  Relative hypolordosis of the lumbar spine, No right flank hernia even with valsalva, no clonus or hyperreflexia, not spasticity.      Specialty Comments:  No specialty comments available.  Imaging: Xr Thoracic Spine 2 View  Result Date: 03/15/2017 AP and lateral Lumbar spine with decreased lordosis generally, has had long segment fusion L1 to ,, The fusions appears solid. AP with assymetric narrowing of the T12-L1 , There is wedging of multiple dorsal vertebrae, there is degenerative disc disease with disc space narrowing and endplate sclerosis. No acute finding.    PMFS History: Patient Active Problem List   Diagnosis Date Noted  . Medicare annual wellness visit, subsequent 01/27/2017  . S/P hernia repair 10/21/2016  . Painful penile erection 10/06/2016  . Ingrown toenail 09/26/2016  . Flank pain 07/12/2016  . Right-sided chest wall pain 07/12/2016  . Abscess of nasal cavity 06/14/2016  . Right flank mass 06/14/2016  . Tobacco use disorder 06/14/2016  . Overweight (BMI 25.0-29.9) 06/14/2016  . Skin exam, screening for cancer 04/04/2016  . Sacral contusion 12/18/2015  . Sinusitis, chronic 08/12/2015  . Arthralgia 07/06/2015  . Intractable nausea and vomiting 06/10/2015  . Pill-induced gastritis 06/10/2015  . Chronic back pain 06/10/2015  . Multiple duodenal ulcers 06/10/2015  . Nausea &  vomiting 05/27/2015  . Rash 05/07/2015  . Hyperlipidemia 05/07/2015  . Migraines 04/21/2015  . 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 Mood change ([place]) 03/19/2015  . Rib pain on right side 02/16/2015  . Essential hypertension 02/16/2015  . Low back pain 02/16/2015   Past Medical History:  Diagnosis Date  . Arthritis   . GERD (gastroesophageal reflux disease)   . Hyperlipidemia   . Hypertension   . Migraines   . Wears glasses     Family History  Problem Relation Age of Onset  . Arthritis Mother   . Hyperlipidemia Mother   . Hypertension Father     Past Surgical History:  Procedure Laterality Date  . APPENDECTOMY  1980s   post op bleeding sent him back for ex lap and hemostatic intervention.   Marland Kitchen BACK SURGERY     8 surgeries total - fused from L1-S1  . COLONOSCOPY    . ESOPHAGOGASTRODUODENOSCOPY N/A 06/10/2015   Procedure: ESOPHAGOGASTRODUODENOSCOPY (EGD);  Surgeon: Lafayette Dragon, MD;  Location: [facility] ENDOSCOPY;  Service: Endoscopy;  Laterality: N/A;  .  ex lap with peri gastric vessel ligation  1990s   after retching, he rupture a blood vessel "outside" the stomach.  vessel was ligated per pt's descriiption.   Fatima Blank HERNIA REPAIR Right 10/21/2016   Procedure: OPEN FLANK HERNIA REPAIR WITH MESH;  Surgeon: Ralene Ok, MD;  Location: [place];  Service: General;  Laterality: Right;  . INSERTION OF MESH Right 10/21/2016   Procedure: INSERTION OF MESH;  Surgeon: Ralene Ok, MD;  Location: [place];  Service: General;  Laterality: Right;  . KNEE SURGERY     Multiple bilateral scopes  . RETINAL DETACHMENT SURGERY Right   . SHOULDER SURGERY     4x on the left - total shoulder replacement.   Marland Kitchen SHOULDER SURGERY Left    x2  . TOTAL SHOULDER ARTHROPLASTY Left    x2   Social History   Occupational History  . Disability     Former Therapist, sports   Social History Main Topics  . Smoking status: Current Every Day Smoker    Packs/day: 1.00    Years: 24.00    Types: Cigarettes  .  Smokeless tobacco: Never Used  . Alcohol use 0.0 oz/week     Comment: rarely  . Drug use: No  . Sexual activity: Not on file

## 2017-03-15 NOTE — Patient Instructions (Signed)
Avoid frequent bending and stooping  No lifting greater than 10 lbs. May use ice or moist heat for pain. Weight loss is of benefit. Handicap license is approved.   

## 2017-03-16 ENCOUNTER — Encounter: Payer: Self-pay | Admitting: Gastroenterology

## 2017-03-16 ENCOUNTER — Ambulatory Visit (INDEPENDENT_AMBULATORY_CARE_PROVIDER_SITE_OTHER): Payer: Medicare Other | Admitting: Gastroenterology

## 2017-03-16 VITALS — BP 110/74 | Ht 71.0 in | Wt 177.4 lb

## 2017-03-16 DIAGNOSIS — Z1211 Encounter for screening for malignant neoplasm of colon: Secondary | ICD-10-CM | POA: Diagnosis not present

## 2017-03-16 DIAGNOSIS — R112 Nausea with vomiting, unspecified: Secondary | ICD-10-CM

## 2017-03-16 DIAGNOSIS — K2981 Duodenitis with bleeding: Secondary | ICD-10-CM

## 2017-03-16 DIAGNOSIS — K269 Duodenal ulcer, unspecified as acute or chronic, without hemorrhage or perforation: Secondary | ICD-10-CM

## 2017-03-16 MED ORDER — NA SULFATE-K SULFATE-MG SULF 17.5-3.13-1.6 GM/177ML PO SOLN: 1.0000 | Freq: Once | ORAL | 0 refills | Status: DC

## 2017-03-16 MED ORDER — NA SULFATE-K SULFATE-MG SULF 17.5-3.13-1.6 GM/177ML PO SOLN: 1.0000 | Freq: Once | ORAL | 0 refills | Status: AC

## 2017-03-16 NOTE — Progress Notes (Signed)
     [facility] Progress Note  Chief Complaint: Vomiting and colon cancer screening  Subjective  History:  This is a 51 year old man previously seen by Dr. Olevia Perches in 2016. He has had many years of frequent vomiting that is of unclear cause. He has taken morphine for many years due to chronic back pain requiring multiple surgeries. He is a very tangential historian, and it is difficult to follow his symptom pattern. There was a time when he would vomit 5 or 6 times a day, sometimes undigested food. He says he has had little or no vomiting in the last 2 weeks. He takes Zofran as needed for control, and cannot recall any other meds they have taken in the past. He recalls having a workup and treatment for vomiting as far back as his 20s. His EGD with Dr. Olevia Perches in July 2016 showed esophagitis and shallow duodenal ulcers felt likely secondary to NSAIDs. H. pylori biopsy was negative.  He has also never had a screening colonoscopy. His bowel habits are regular without rectal bleeding, and he denies a family history of colorectal cancer.  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 He is unable to work due to his chronic back pain. The patient's Past Medical, Family and Social History were reviewed and are on file in the EMR.  Objective:  Med list reviewed  Vital signs in last 24 hrs: Vitals:   03/16/17 1054  BP: 110/74    Physical Exam  He is pleasant, anxious appearing, fidgety and very animated with an odd affect.  HEENT: sclera anicteric, oral mucosa moist without lesions  Neck: supple, no thyromegaly, JVD or lymphadenopathy  Cardiac: RRR without murmurs, S1S2 heard, no peripheral edema  Pulm: clear to auscultation bilaterally, normal RR and effort noted  Abdomen: soft, no tenderness, with active bowel sounds. No guarding or palpable hepatosplenomegaly.  Skin; warm and dry, no jaundice or  rash   @ASSESSMENTPLANBEGIN @ Assessment: Encounter Diagnoses  Name Primary?  . Non-intractable vomiting with nausea, unspecified vomiting type Yes  . Multiple duodenal ulcers   . Special screening for malignant neoplasms, colon   He is not likely to have persistent ulcers at this point since he does not use NSAIDs anymore. His vomiting seems to be a side effect of his chronic narcotic therapy. I am not planning to try metoclopramide because I'm concerned about the possibility of side effects and this man.  He has lately had good control of symptoms and his appetite is good and weight stable. Plan: Screening colonoscopy.  He is agreeable after discussion of the procedure and risks.  Total time 20 minutes, over half spent in counseling and coordination of care.   Nelida Meuse III

## 2017-03-16 NOTE — Patient Instructions (Signed)
If you are age 51 or older, your body mass index should be between 23-30. Your Body mass index is 24.74 kg/m. If this is out of the aforementioned range listed, please consider follow up with your Primary Care Provider.  If you are age 13 or younger, your body mass index should be between 19-25. Your Body mass index is 24.74 kg/m. If this is out of the aformentioned range listed, please consider follow up with your Primary Care Provider.   You have been scheduled for a colonoscopy. Please follow written instructions given to you at your visit today.  Please pick up your prep supplies at the pharmacy within the next 1-3 days. If you use inhalers (even only as needed), please bring them with you on the day of your procedure. Your physician has requested that you go to www.startemmi.com and enter the access code given to you at your visit today. This web site gives a general overview about your procedure. However, you should still follow specific instructions given to you by our office regarding your preparation for the procedure.  Thank you for choosing [facility]  Dr Wilfrid Lund III

## 2017-03-21 ENCOUNTER — Ambulatory Visit (INDEPENDENT_AMBULATORY_CARE_PROVIDER_SITE_OTHER): Payer: Medicare Other | Admitting: Specialist

## 2017-03-21 ENCOUNTER — Encounter (INDEPENDENT_AMBULATORY_CARE_PROVIDER_SITE_OTHER): Payer: Self-pay | Admitting: Specialist

## 2017-03-21 ENCOUNTER — Telehealth (INDEPENDENT_AMBULATORY_CARE_PROVIDER_SITE_OTHER): Payer: Self-pay | Admitting: Orthopedic Surgery

## 2017-03-21 VITALS — BP 153/91 | HR 70 | Ht 71.0 in | Wt 178.0 lb

## 2017-03-21 DIAGNOSIS — M546 Pain in thoracic spine: Secondary | ICD-10-CM | POA: Diagnosis not present

## 2017-03-21 DIAGNOSIS — M62830 Muscle spasm of back: Secondary | ICD-10-CM | POA: Diagnosis not present

## 2017-03-21 DIAGNOSIS — M545 Low back pain: Secondary | ICD-10-CM | POA: Diagnosis not present

## 2017-03-21 DIAGNOSIS — R531 Weakness: Secondary | ICD-10-CM | POA: Diagnosis not present

## 2017-03-21 DIAGNOSIS — M5136 Other intervertebral disc degeneration, lumbar region: Secondary | ICD-10-CM | POA: Diagnosis not present

## 2017-03-21 NOTE — Progress Notes (Signed)
Office Visit Note   Patient: Jeremiah Anderson           Date of Birth: 1966-06-30           MRN: [id number] Visit Date: 03/21/2017              Requested by: [address], [place], [place] [address] PCP: [name], FNP   Assessment & Plan: Visit Diagnoses:  1. Lumbar paraspinal muscle spasm    2.  Status post right flank hernia repair by Dr. Rosendo Gros December 2017  Plan: Patient will forward with physical therapy. I advised him to also contact Dr. Johney Frame office to see if any of the pain that he is currently describing could possibly be related to the hernia repair that he had. I will also see about getting patient a home TENS unit. He is unable to take oral NSAIDs due to history of gastritis. Is also being followed for chronic pain management. Follow-up in 6 weeks for recheck. Returns sooner if needed.  Follow-Up Instructions: Return in about 6 weeks (around 05/02/2017).   Orders:  No orders of the defined types were placed in this encounter.  No orders of the defined types were placed in this encounter.     Procedures: No procedures performed   Clinical Data: No additional findings.   Subjective: Chief Complaint  Patient presents with  . Lower Back - Pain    HPI Patient returns. Was seen in the office last week but states that Thursday he was doing recently well when he went to bed but woke up about 4:00 the next morning with right-sided low back pain. No injury. Denies lower extremity radiculopathy. Feels like he has a cramp in his right side. He has had previous right flank hernia repair by Dr. Rosendo Gros but is not contacted his office yet to see if that may be contributing to his discomfort.    Review of Systems  Constitutional: Positive for activity change.  HENT: Negative.   Cardiovascular: Negative.   Genitourinary: Negative.   Musculoskeletal: Positive for back pain.  Neurological: Negative.      Objective: Vital Signs: BP (!)  153/91 (BP Location: Left Arm, Patient Position: Sitting)   Pulse 70   Ht 5\' 11"  (1.803 m)   Wt 178 lb (80.7 kg)   BMI 24.83 kg/m   Physical Exam  Constitutional: He is oriented to person, place, and time. He appears well-nourished. No distress.  HENT:  Head: Normocephalic and atraumatic.  Eyes: EOM are normal. Pupils are equal, round, and reactive to light.  Pulmonary/Chest: No respiratory distress.  Musculoskeletal:  Gait is normal. He does have right-sided lumbar paraspinal spasm that is tender. Negative on the left side. Negative logroll bilateral hips. Negative straight leg raise. Neurovascularly intact. No focal motor deficits.  Neurological: He is alert and oriented to person, place, and time.  Skin: Skin is warm and dry.    Ortho Exam  Specialty Comments:  No specialty comments available.  Imaging: No results found.   PMFS History: Patient Active Problem List   Diagnosis Date Noted  . Medicare annual wellness visit, subsequent 01/27/2017  . S/P hernia repair 10/21/2016  . Painful penile erection 10/06/2016  . Ingrown toenail 09/26/2016  . Flank pain 07/12/2016  . Right-sided chest wall pain 07/12/2016  . Abscess of nasal cavity 06/14/2016  . Right flank mass 06/14/2016  . Tobacco use disorder 06/14/2016  . Overweight (BMI 25.0-29.9) 06/14/2016  . Skin exam, screening  for cancer 04/04/2016  . Sacral contusion 12/18/2015  . Sinusitis, chronic 08/12/2015  . Arthralgia 07/06/2015  . Pill-induced gastritis 06/10/2015  . Chronic back pain 06/10/2015  . Multiple duodenal ulcers 06/10/2015  . Nausea & vomiting 05/27/2015  . Rash 05/07/2015  . Hyperlipidemia 05/07/2015  . Migraines 04/21/2015  . 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 Mood change ([place]) 03/19/2015  . Rib pain on right side 02/16/2015  . Essential hypertension 02/16/2015  . Low back pain 02/16/2015   Past Medical History:  Diagnosis Date  . Arthritis   . GERD (gastroesophageal reflux  disease)   . Hyperlipidemia   . Hypertension   . Migraines   . Wears glasses     Family History  Problem Relation Age of Onset  . Arthritis Mother   . Hyperlipidemia Mother   . Hypertension Father   . Colon cancer Maternal Grandmother     Past Surgical History:  Procedure Laterality Date  . APPENDECTOMY  1980s   post op bleeding sent him back for ex lap and hemostatic intervention.   Marland Kitchen BACK SURGERY     8 surgeries total - fused from L1-S1  . COLONOSCOPY     Age 41. For bleeding  . ESOPHAGOGASTRODUODENOSCOPY N/A 06/10/2015   Procedure: ESOPHAGOGASTRODUODENOSCOPY (EGD);  Surgeon: Lafayette Dragon, MD;  [facility];  Service: Endoscopy;  Laterality: N/A;  . ex lap with peri gastric vessel ligation  1990s   after retching, he rupture a blood vessel "outside" the stomach.  vessel was ligated per pt's descriiption.   Fatima Blank HERNIA REPAIR Right 10/21/2016   Procedure: OPEN FLANK HERNIA REPAIR WITH MESH;  Surgeon: Ralene Ok, MD;  Location: [facility];  Service: General;  Laterality: Right;  . INSERTION OF MESH Right 10/21/2016   Procedure: INSERTION OF MESH;  Surgeon: Ralene Ok, MD;  Location: [facility];  Service: General;  Laterality: Right;  . KNEE SURGERY     Multiple bilateral scopes  . RETINAL DETACHMENT SURGERY Right   . SHOULDER SURGERY     4x on the left - total shoulder replacement.   Marland Kitchen SHOULDER SURGERY Left    x2  . TOTAL SHOULDER ARTHROPLASTY Left    x2   Social History   Occupational History  . Disability     Former Therapist, sports   Social History Main Topics  . Smoking status: Current Every Day Smoker    Years: 24.00    Types: Cigarettes  . Smokeless tobacco: Never Used  . Alcohol use 0.0 oz/week     Comment: rarely  . Drug use: No  . Sexual activity: Not on file

## 2017-03-21 NOTE — Telephone Encounter (Signed)
I contacted Clarise Cruz with [facility] at the request of Benjiman Core, PA-C. Per Jeneen Rinks, Mr. Hanrahan needs a TENS unit.  Clarise Cruz came by the office and confirmed that she received my message and will take care of ordering this unit for our patient. All info given to [place].

## 2017-03-22 ENCOUNTER — Ambulatory Visit: Payer: Medicare Other | Admitting: Psychology

## 2017-03-23 ENCOUNTER — Other Ambulatory Visit: Payer: Self-pay | Admitting: Family

## 2017-03-23 ENCOUNTER — Encounter: Payer: Self-pay | Admitting: Gastroenterology

## 2017-03-23 DIAGNOSIS — M545 Low back pain: Secondary | ICD-10-CM | POA: Diagnosis not present

## 2017-03-23 DIAGNOSIS — M5136 Other intervertebral disc degeneration, lumbar region: Secondary | ICD-10-CM | POA: Diagnosis not present

## 2017-03-23 DIAGNOSIS — M546 Pain in thoracic spine: Secondary | ICD-10-CM | POA: Diagnosis not present

## 2017-03-23 DIAGNOSIS — R531 Weakness: Secondary | ICD-10-CM | POA: Diagnosis not present

## 2017-03-23 DIAGNOSIS — E291 Testicular hypofunction: Secondary | ICD-10-CM | POA: Diagnosis not present

## 2017-03-23 NOTE — Telephone Encounter (Signed)
Faxed

## 2017-03-27 DIAGNOSIS — G8929 Other chronic pain: Secondary | ICD-10-CM | POA: Diagnosis not present

## 2017-03-27 DIAGNOSIS — M549 Dorsalgia, unspecified: Secondary | ICD-10-CM | POA: Diagnosis not present

## 2017-03-28 DIAGNOSIS — L910 Hypertrophic scar: Secondary | ICD-10-CM | POA: Diagnosis not present

## 2017-03-28 DIAGNOSIS — L309 Dermatitis, unspecified: Secondary | ICD-10-CM | POA: Diagnosis not present

## 2017-03-29 ENCOUNTER — Ambulatory Visit (INDEPENDENT_AMBULATORY_CARE_PROVIDER_SITE_OTHER): Payer: Medicare Other | Admitting: Psychology

## 2017-03-29 DIAGNOSIS — M5136 Other intervertebral disc degeneration, lumbar region: Secondary | ICD-10-CM | POA: Diagnosis not present

## 2017-03-29 DIAGNOSIS — F901 Attention-deficit hyperactivity disorder, predominantly hyperactive type: Secondary | ICD-10-CM | POA: Diagnosis not present

## 2017-03-29 DIAGNOSIS — M546 Pain in thoracic spine: Secondary | ICD-10-CM | POA: Diagnosis not present

## 2017-03-29 DIAGNOSIS — M545 Low back pain: Secondary | ICD-10-CM | POA: Diagnosis not present

## 2017-03-29 DIAGNOSIS — R531 Weakness: Secondary | ICD-10-CM | POA: Diagnosis not present

## 2017-03-31 DIAGNOSIS — M546 Pain in thoracic spine: Secondary | ICD-10-CM | POA: Diagnosis not present

## 2017-03-31 DIAGNOSIS — M545 Low back pain: Secondary | ICD-10-CM | POA: Diagnosis not present

## 2017-03-31 DIAGNOSIS — R531 Weakness: Secondary | ICD-10-CM | POA: Diagnosis not present

## 2017-03-31 DIAGNOSIS — M5136 Other intervertebral disc degeneration, lumbar region: Secondary | ICD-10-CM | POA: Diagnosis not present

## 2017-04-03 DIAGNOSIS — M5136 Other intervertebral disc degeneration, lumbar region: Secondary | ICD-10-CM | POA: Diagnosis not present

## 2017-04-03 DIAGNOSIS — M545 Low back pain: Secondary | ICD-10-CM | POA: Diagnosis not present

## 2017-04-03 DIAGNOSIS — R531 Weakness: Secondary | ICD-10-CM | POA: Diagnosis not present

## 2017-04-03 DIAGNOSIS — M546 Pain in thoracic spine: Secondary | ICD-10-CM | POA: Diagnosis not present

## 2017-04-05 ENCOUNTER — Encounter: Payer: Self-pay | Admitting: Gastroenterology

## 2017-04-05 ENCOUNTER — Ambulatory Visit: Payer: Medicare Other | Admitting: Psychology

## 2017-04-05 ENCOUNTER — Ambulatory Visit (AMBULATORY_SURGERY_CENTER): Payer: Medicare Other | Admitting: Gastroenterology

## 2017-04-05 VITALS — BP 116/77 | HR 72 | Temp 97.7°F | Resp 11 | Ht 71.0 in | Wt 177.0 lb

## 2017-04-05 DIAGNOSIS — Z1212 Encounter for screening for malignant neoplasm of rectum: Secondary | ICD-10-CM

## 2017-04-05 DIAGNOSIS — I10 Essential (primary) hypertension: Secondary | ICD-10-CM | POA: Diagnosis not present

## 2017-04-05 DIAGNOSIS — Z1211 Encounter for screening for malignant neoplasm of colon: Secondary | ICD-10-CM

## 2017-04-05 MED ORDER — SODIUM CHLORIDE 0.9 % IV SOLN: 500.0000 mL | INTRAVENOUS | Status: DC

## 2017-04-05 NOTE — Progress Notes (Signed)
Patient awakening,vss,report to rn 

## 2017-04-05 NOTE — Op Note (Signed)
[facility] Patient Name: Jeremiah Anderson Procedure Date: 04/05/2017 9:09 AM MRN: [id number] Endoscopist: [place]. Loletha Carrow , MD Age: 51 Referring MD:  Date of Birth: 1966-04-26 Gender: Male Account #: [id number] Procedure:                Colonoscopy Indications:              Screening for colorectal malignant neoplasm, This                            is the patient's first colonoscopy Medicines:                Monitored Anesthesia Care Procedure:                Pre-Anesthesia Assessment:                           - Prior to the procedure, a History and Physical                            was performed, and patient medications and                            allergies were reviewed. The patient's tolerance of                            previous anesthesia was also reviewed. The risks                            and benefits of the procedure and the sedation                            options and risks were discussed with the patient.                            All questions were answered, and informed consent                            was obtained. Prior Anticoagulants: The patient has                            taken no previous anticoagulant or antiplatelet                            agents. ASA Grade Assessment: II - A patient with                            mild systemic disease. After reviewing the risks                            and benefits, the patient was deemed in                            satisfactory condition to undergo the procedure.  After obtaining informed consent, the colonoscope                            was passed under direct vision. Throughout the                            procedure, the patient's blood pressure, pulse, and                            oxygen saturations were monitored continuously. The                            Colonoscope was introduced through the anus and                            advanced to the the cecum,  identified by                            appendiceal orifice and ileocecal valve. The                            colonoscopy was performed without difficulty. The                            patient tolerated the procedure well. The quality                            of the bowel preparation was good. The ileocecal                            valve, appendiceal orifice, and rectum were                            photographed. The quality of the bowel preparation                            was evaluated using the BBPS [facility] Bowel                            Preparation Scale) with scores of: Right Colon = 2,                            Transverse Colon = 2 and Left Colon = 2. The total                            BBPS score equals 6. The bowel preparation used was                            SUPREP. Scope In: 9:14:06 AM Scope Out: 9:29:57 AM Scope Withdrawal Time: 0 hours 11 minutes 41 seconds  Total Procedure Duration: 0 hours 15 minutes 51 seconds  Findings:                 The perianal and digital rectal  examinations were                            normal.                           Multiple small-mouthed diverticula were found in                            the left colon.                           The exam was otherwise without abnormality on                            direct and retroflexion views. Complications:            No immediate complications. Estimated Blood Loss:     Estimated blood loss: none. Impression:               - Diverticulosis in the left colon.                           - The examination was otherwise normal on direct                            and retroflexion views.                           - No specimens collected. Recommendation:           - Patient has a contact number available for                            emergencies. The signs and symptoms of potential                            delayed complications were discussed with the                             patient. Return to normal activities tomorrow.                            Written discharge instructions were provided to the                            patient.                           - Resume previous diet.                           - Continue present medications.                           - Repeat colonoscopy in 10 years for screening  purposes. Henry L. Loletha Carrow, MD 04/05/2017 9:33:42 AM This report has been signed electronically.

## 2017-04-05 NOTE — Patient Instructions (Signed)
YOU HAD AN ENDOSCOPIC PROCEDURE TODAY AT THE [facility]:   Refer to the procedure report that was given to you for any specific questions about what was found during the examination.  If the procedure report does not answer your questions, please call your gastroenterologist to clarify.  If you requested that your care partner not be given the details of your procedure findings, then the procedure report has been included in a sealed envelope for you to review at your convenience later.  YOU SHOULD EXPECT: Some feelings of bloating in the abdomen. Passage of more gas than usual.  Walking can help get rid of the air that was put into your GI tract during the procedure and reduce the bloating. If you had a lower endoscopy (such as a colonoscopy or flexible sigmoidoscopy) you may notice spotting of blood in your stool or on the toilet paper. If you underwent a bowel prep for your procedure, you may not have a normal bowel movement for a few days.  Please Note:  You might notice some irritation and congestion in your nose or some drainage.  This is from the oxygen used during your procedure.  There is no need for concern and it should clear up in a day or so.  SYMPTOMS TO REPORT IMMEDIATELY:   Following lower endoscopy (colonoscopy or flexible sigmoidoscopy):  Excessive amounts of blood in the stool  Significant tenderness or worsening of abdominal pains  Swelling of the abdomen that is new, acute  Fever of 100F or higher    For urgent or emergent issues, a gastroenterologist can be reached at any hour by calling [phone].   DIET:  We do recommend a small meal at first, but then you may proceed to your regular diet.  Drink plenty of fluids but you should avoid alcoholic beverages for 24 hours.  ACTIVITY:  You should plan to take it easy for the rest of today and you should NOT DRIVE or use heavy machinery until tomorrow (because of the sedation medicines used during the test).     FOLLOW UP: Our staff will call the number listed on your records the next business day following your procedure to check on you and address any questions or concerns that you may have regarding the information given to you following your procedure. If we do not reach you, we will leave a message.  However, if you are feeling well and you are not experiencing any problems, there is no need to return our call.  We will assume that you have returned to your regular daily activities without incident.  If any biopsies were taken you will be contacted by phone or by letter within the next 1-3 weeks.  Please call us at [phone] if you have not heard about the biopsies in 3 weeks.    SIGNATURES/CONFIDENTIALITY: You and/or your care partner have signed paperwork which will be entered into your electronic medical record.  These signatures attest to the fact that that the information above on your After Visit Summary has been reviewed and is understood.  Full responsibility of the confidentiality of this discharge information lies with you and/or your care-partner.   Resume medications. Information given on diverticulosis. 

## 2017-04-06 ENCOUNTER — Telehealth: Payer: Self-pay

## 2017-04-06 ENCOUNTER — Telehealth: Payer: Self-pay | Admitting: *Deleted

## 2017-04-06 DIAGNOSIS — E291 Testicular hypofunction: Secondary | ICD-10-CM | POA: Diagnosis not present

## 2017-04-06 DIAGNOSIS — R531 Weakness: Secondary | ICD-10-CM | POA: Diagnosis not present

## 2017-04-06 DIAGNOSIS — M546 Pain in thoracic spine: Secondary | ICD-10-CM | POA: Diagnosis not present

## 2017-04-06 DIAGNOSIS — M545 Low back pain: Secondary | ICD-10-CM | POA: Diagnosis not present

## 2017-04-06 DIAGNOSIS — M5136 Other intervertebral disc degeneration, lumbar region: Secondary | ICD-10-CM | POA: Diagnosis not present

## 2017-04-06 NOTE — Telephone Encounter (Signed)
Attempted to reach patient for post-procedure f/u call. No answer. Left message that we will attempt to reach him later today and to please call us if he has any questions or concerns about his care.

## 2017-04-06 NOTE — Telephone Encounter (Signed)
  Follow up Call-  Call back number 04/05/2017  Post procedure Call Back phone  # [phone]  Permission to leave phone message Yes  Some recent data might be hidden    [facility]

## 2017-04-07 DIAGNOSIS — G894 Chronic pain syndrome: Secondary | ICD-10-CM | POA: Diagnosis not present

## 2017-04-07 DIAGNOSIS — Z79891 Long term (current) use of opiate analgesic: Secondary | ICD-10-CM | POA: Diagnosis not present

## 2017-04-07 DIAGNOSIS — R51 Headache: Secondary | ICD-10-CM | POA: Diagnosis not present

## 2017-04-07 DIAGNOSIS — M961 Postlaminectomy syndrome, not elsewhere classified: Secondary | ICD-10-CM | POA: Diagnosis not present

## 2017-04-10 DIAGNOSIS — M546 Pain in thoracic spine: Secondary | ICD-10-CM | POA: Diagnosis not present

## 2017-04-10 DIAGNOSIS — R531 Weakness: Secondary | ICD-10-CM | POA: Diagnosis not present

## 2017-04-10 DIAGNOSIS — M545 Low back pain: Secondary | ICD-10-CM | POA: Diagnosis not present

## 2017-04-10 DIAGNOSIS — M5136 Other intervertebral disc degeneration, lumbar region: Secondary | ICD-10-CM | POA: Diagnosis not present

## 2017-04-12 ENCOUNTER — Ambulatory Visit (INDEPENDENT_AMBULATORY_CARE_PROVIDER_SITE_OTHER): Payer: Medicare Other | Admitting: Psychology

## 2017-04-12 DIAGNOSIS — F901 Attention-deficit hyperactivity disorder, predominantly hyperactive type: Secondary | ICD-10-CM

## 2017-04-12 DIAGNOSIS — M5136 Other intervertebral disc degeneration, lumbar region: Secondary | ICD-10-CM | POA: Diagnosis not present

## 2017-04-12 DIAGNOSIS — M545 Low back pain: Secondary | ICD-10-CM | POA: Diagnosis not present

## 2017-04-12 DIAGNOSIS — M546 Pain in thoracic spine: Secondary | ICD-10-CM | POA: Diagnosis not present

## 2017-04-12 DIAGNOSIS — R531 Weakness: Secondary | ICD-10-CM | POA: Diagnosis not present

## 2017-04-19 ENCOUNTER — Ambulatory Visit: Payer: Medicare Other | Admitting: Psychology

## 2017-04-20 ENCOUNTER — Other Ambulatory Visit: Payer: Self-pay | Admitting: Family

## 2017-04-20 NOTE — Telephone Encounter (Signed)
Last refill was 03/23/17

## 2017-04-21 ENCOUNTER — Telehealth: Payer: Self-pay | Admitting: Family

## 2017-04-21 MED ORDER — BUTALBITAL-APAP-CAFFEINE 50-325-40 MG PO TABS: 1.0000 | ORAL_TABLET | Freq: Three times a day (TID) | ORAL | 0 refills | Status: DC | PRN

## 2017-04-21 NOTE — Telephone Encounter (Signed)
Pt is trying to go out of town for a week next week and needs to know if greg can call in his his butalbital-acetaminophen-caffeine [employer], [facility]) 50-325-40 MG tablet [[id number]]    [facility] in [place]

## 2017-04-21 NOTE — Telephone Encounter (Signed)
Are you ok with filling this?

## 2017-04-21 NOTE — Telephone Encounter (Signed)
Medication printed to be faxed.  

## 2017-04-24 DIAGNOSIS — Z125 Encounter for screening for malignant neoplasm of prostate: Secondary | ICD-10-CM | POA: Diagnosis not present

## 2017-04-24 DIAGNOSIS — E291 Testicular hypofunction: Secondary | ICD-10-CM | POA: Diagnosis not present

## 2017-04-24 NOTE — Telephone Encounter (Signed)
Faxed

## 2017-04-25 DIAGNOSIS — R531 Weakness: Secondary | ICD-10-CM | POA: Diagnosis not present

## 2017-04-25 DIAGNOSIS — M545 Low back pain: Secondary | ICD-10-CM | POA: Diagnosis not present

## 2017-04-25 DIAGNOSIS — M546 Pain in thoracic spine: Secondary | ICD-10-CM | POA: Diagnosis not present

## 2017-04-25 DIAGNOSIS — M5136 Other intervertebral disc degeneration, lumbar region: Secondary | ICD-10-CM | POA: Diagnosis not present

## 2017-04-26 ENCOUNTER — Ambulatory Visit (INDEPENDENT_AMBULATORY_CARE_PROVIDER_SITE_OTHER): Payer: Medicare Other | Admitting: Psychology

## 2017-04-26 DIAGNOSIS — F901 Attention-deficit hyperactivity disorder, predominantly hyperactive type: Secondary | ICD-10-CM | POA: Diagnosis not present

## 2017-04-28 DIAGNOSIS — R531 Weakness: Secondary | ICD-10-CM | POA: Diagnosis not present

## 2017-04-28 DIAGNOSIS — M546 Pain in thoracic spine: Secondary | ICD-10-CM | POA: Diagnosis not present

## 2017-04-28 DIAGNOSIS — M5136 Other intervertebral disc degeneration, lumbar region: Secondary | ICD-10-CM | POA: Diagnosis not present

## 2017-04-28 DIAGNOSIS — M545 Low back pain: Secondary | ICD-10-CM | POA: Diagnosis not present

## 2017-05-03 ENCOUNTER — Ambulatory Visit (INDEPENDENT_AMBULATORY_CARE_PROVIDER_SITE_OTHER): Payer: Medicare Other | Admitting: Psychology

## 2017-05-03 DIAGNOSIS — M545 Low back pain: Secondary | ICD-10-CM | POA: Diagnosis not present

## 2017-05-03 DIAGNOSIS — M5136 Other intervertebral disc degeneration, lumbar region: Secondary | ICD-10-CM | POA: Diagnosis not present

## 2017-05-03 DIAGNOSIS — F4323 Adjustment disorder with mixed anxiety and depressed mood: Secondary | ICD-10-CM

## 2017-05-03 DIAGNOSIS — F901 Attention-deficit hyperactivity disorder, predominantly hyperactive type: Secondary | ICD-10-CM | POA: Diagnosis not present

## 2017-05-03 DIAGNOSIS — R531 Weakness: Secondary | ICD-10-CM | POA: Diagnosis not present

## 2017-05-03 DIAGNOSIS — M546 Pain in thoracic spine: Secondary | ICD-10-CM | POA: Diagnosis not present

## 2017-05-04 ENCOUNTER — Ambulatory Visit (INDEPENDENT_AMBULATORY_CARE_PROVIDER_SITE_OTHER): Payer: Medicare Other | Admitting: Specialist

## 2017-05-05 DIAGNOSIS — Z79891 Long term (current) use of opiate analgesic: Secondary | ICD-10-CM | POA: Diagnosis not present

## 2017-05-05 DIAGNOSIS — M961 Postlaminectomy syndrome, not elsewhere classified: Secondary | ICD-10-CM | POA: Diagnosis not present

## 2017-05-05 DIAGNOSIS — G894 Chronic pain syndrome: Secondary | ICD-10-CM | POA: Diagnosis not present

## 2017-05-05 DIAGNOSIS — M545 Low back pain: Secondary | ICD-10-CM | POA: Diagnosis not present

## 2017-05-05 DIAGNOSIS — M546 Pain in thoracic spine: Secondary | ICD-10-CM | POA: Diagnosis not present

## 2017-05-05 DIAGNOSIS — M5136 Other intervertebral disc degeneration, lumbar region: Secondary | ICD-10-CM | POA: Diagnosis not present

## 2017-05-05 DIAGNOSIS — R531 Weakness: Secondary | ICD-10-CM | POA: Diagnosis not present

## 2017-05-05 DIAGNOSIS — R51 Headache: Secondary | ICD-10-CM | POA: Diagnosis not present

## 2017-05-08 ENCOUNTER — Other Ambulatory Visit: Payer: Self-pay | Admitting: Family

## 2017-05-08 DIAGNOSIS — R531 Weakness: Secondary | ICD-10-CM | POA: Diagnosis not present

## 2017-05-08 DIAGNOSIS — M545 Low back pain: Secondary | ICD-10-CM | POA: Diagnosis not present

## 2017-05-08 DIAGNOSIS — M5136 Other intervertebral disc degeneration, lumbar region: Secondary | ICD-10-CM | POA: Diagnosis not present

## 2017-05-08 DIAGNOSIS — M546 Pain in thoracic spine: Secondary | ICD-10-CM | POA: Diagnosis not present

## 2017-05-11 DIAGNOSIS — M5136 Other intervertebral disc degeneration, lumbar region: Secondary | ICD-10-CM | POA: Diagnosis not present

## 2017-05-11 DIAGNOSIS — M545 Low back pain: Secondary | ICD-10-CM | POA: Diagnosis not present

## 2017-05-11 DIAGNOSIS — M546 Pain in thoracic spine: Secondary | ICD-10-CM | POA: Diagnosis not present

## 2017-05-11 DIAGNOSIS — R531 Weakness: Secondary | ICD-10-CM | POA: Diagnosis not present

## 2017-05-17 ENCOUNTER — Ambulatory Visit: Payer: Medicare Other | Admitting: Psychology

## 2017-05-19 ENCOUNTER — Ambulatory Visit (INDEPENDENT_AMBULATORY_CARE_PROVIDER_SITE_OTHER): Payer: Medicare Other | Admitting: Psychology

## 2017-05-19 DIAGNOSIS — F901 Attention-deficit hyperactivity disorder, predominantly hyperactive type: Secondary | ICD-10-CM | POA: Diagnosis not present

## 2017-05-19 DIAGNOSIS — F4323 Adjustment disorder with mixed anxiety and depressed mood: Secondary | ICD-10-CM

## 2017-05-30 DIAGNOSIS — H2513 Age-related nuclear cataract, bilateral: Secondary | ICD-10-CM | POA: Diagnosis not present

## 2017-05-30 DIAGNOSIS — H43392 Other vitreous opacities, left eye: Secondary | ICD-10-CM | POA: Diagnosis not present

## 2017-05-30 DIAGNOSIS — H31092 Other chorioretinal scars, left eye: Secondary | ICD-10-CM | POA: Diagnosis not present

## 2017-05-30 DIAGNOSIS — H33311 Horseshoe tear of retina without detachment, right eye: Secondary | ICD-10-CM | POA: Diagnosis not present

## 2017-05-30 DIAGNOSIS — H43811 Vitreous degeneration, right eye: Secondary | ICD-10-CM | POA: Diagnosis not present

## 2017-05-31 ENCOUNTER — Ambulatory Visit (INDEPENDENT_AMBULATORY_CARE_PROVIDER_SITE_OTHER): Payer: Medicare Other | Admitting: Psychology

## 2017-05-31 DIAGNOSIS — F4323 Adjustment disorder with mixed anxiety and depressed mood: Secondary | ICD-10-CM | POA: Diagnosis not present

## 2017-05-31 DIAGNOSIS — F901 Attention-deficit hyperactivity disorder, predominantly hyperactive type: Secondary | ICD-10-CM | POA: Diagnosis not present

## 2017-06-05 DIAGNOSIS — N486 Induration penis plastica: Secondary | ICD-10-CM | POA: Diagnosis not present

## 2017-06-05 DIAGNOSIS — E291 Testicular hypofunction: Secondary | ICD-10-CM | POA: Diagnosis not present

## 2017-06-15 ENCOUNTER — Other Ambulatory Visit: Payer: Self-pay | Admitting: Family

## 2017-06-19 DIAGNOSIS — L249 Irritant contact dermatitis, unspecified cause: Secondary | ICD-10-CM | POA: Diagnosis not present

## 2017-06-19 DIAGNOSIS — L700 Acne vulgaris: Secondary | ICD-10-CM | POA: Diagnosis not present

## 2017-06-21 ENCOUNTER — Ambulatory Visit (INDEPENDENT_AMBULATORY_CARE_PROVIDER_SITE_OTHER): Payer: Medicare Other | Admitting: Psychology

## 2017-06-21 DIAGNOSIS — F901 Attention-deficit hyperactivity disorder, predominantly hyperactive type: Secondary | ICD-10-CM

## 2017-06-28 ENCOUNTER — Ambulatory Visit (INDEPENDENT_AMBULATORY_CARE_PROVIDER_SITE_OTHER): Payer: Medicare Other | Admitting: Psychology

## 2017-06-28 DIAGNOSIS — F4323 Adjustment disorder with mixed anxiety and depressed mood: Secondary | ICD-10-CM

## 2017-06-28 DIAGNOSIS — F901 Attention-deficit hyperactivity disorder, predominantly hyperactive type: Secondary | ICD-10-CM | POA: Diagnosis not present

## 2017-06-28 DIAGNOSIS — F1920 Other psychoactive substance dependence, uncomplicated: Secondary | ICD-10-CM | POA: Diagnosis not present

## 2017-06-30 DIAGNOSIS — M961 Postlaminectomy syndrome, not elsewhere classified: Secondary | ICD-10-CM | POA: Diagnosis not present

## 2017-06-30 DIAGNOSIS — Z79891 Long term (current) use of opiate analgesic: Secondary | ICD-10-CM | POA: Diagnosis not present

## 2017-06-30 DIAGNOSIS — M4722 Other spondylosis with radiculopathy, cervical region: Secondary | ICD-10-CM | POA: Diagnosis not present

## 2017-06-30 DIAGNOSIS — G894 Chronic pain syndrome: Secondary | ICD-10-CM | POA: Diagnosis not present

## 2017-07-04 ENCOUNTER — Ambulatory Visit (INDEPENDENT_AMBULATORY_CARE_PROVIDER_SITE_OTHER): Payer: Medicare Other | Admitting: Family Medicine

## 2017-07-04 ENCOUNTER — Encounter: Payer: Self-pay | Admitting: Family Medicine

## 2017-07-04 VITALS — BP 130/86 | HR 79 | Temp 98.4°F | Ht 71.0 in | Wt 179.0 lb

## 2017-07-04 DIAGNOSIS — S99202D Unspecified physeal fracture of phalanx of left toe, subsequent encounter for fracture with routine healing: Secondary | ICD-10-CM

## 2017-07-04 NOTE — Assessment & Plan Note (Signed)
Acute worsening problem. And apparently reviewed ultrasound and perform this. Exam seems to be consistent with the proximal phalanx fracture of the fifth phalanx. He is on chronic narcotics due to significant back history with multiple surgeries. He can continue this for his pain relief. - Provided a postop shoe today. - Headed discussion about x-ray. Would not likely change the management at this time. Can always obtain an x-ray in the future. - Follow-up in 3-4 weeks. Can ultrasound at that time.

## 2017-07-04 NOTE — Patient Instructions (Addendum)
Thank you for coming in,   It appears that your PQ toe is broken. He can follow-up with me in 3 weeks if you're not having improvement. Otherwise please try to wear the postop shoe for roughly 4 weeks.   Please feel free to call with any questions or concerns at any time, at [phone]. --Dr. Raeford Razor

## 2017-07-04 NOTE — Progress Notes (Signed)
Jeremiah Anderson - 51 y.o. male MRN [id number]  Date of birth: 1965/12/18  SUBJECTIVE:  Including CC & ROS.  Chief Complaint  Patient presents with  . Foot Pain    left foot Patient states he rolled his foot and fell into his table at home 2-3 weeks ago and the pain has gotten worse and it is swelling      Jeremiah Anderson is a 51 year old male that is presenting with left lateral foot pain. He was getting up out of bed and hit his toe on something hard. This occurred 3 weeks ago. Since that time he is having lateral left foot pain that is worsening in nature. This has been occurring for 3 weeks. He reports no bruising or some swelling. He has taken morphine chronic back pain and still is feeling this pain. The pain is worse with any kind of walking or working out. He is continuing to work out and the pain seems to be worse after this. Pain is throbbing and sharp in nature.   Review of Systems  Cardiovascular: Negative for leg swelling.  Musculoskeletal: Positive for joint swelling. Negative for gait problem and myalgias.  Skin: Positive for color change.  Neurological: Negative for weakness and numbness.  Hematological: Negative for adenopathy.   otherwise negative  HISTORY: Past Medical, Surgical, Social, and Family History Reviewed & Updated per EMR.   Pertinent Historical Findings include:  Past Medical History:  Diagnosis Date  . Arthritis   . GERD (gastroesophageal reflux disease)   . Hyperlipidemia   . Hypertension   . Migraines   . Wears glasses     Past Surgical History:  Procedure Laterality Date  . APPENDECTOMY  1980s   post op bleeding sent him back for ex lap and hemostatic intervention.   Marland Kitchen BACK SURGERY     8 surgeries total - fused from L1-S1  . COLONOSCOPY     Age 70. For bleeding  . ESOPHAGOGASTRODUODENOSCOPY N/A 06/10/2015   Procedure: ESOPHAGOGASTRODUODENOSCOPY (EGD);  Surgeon: Lafayette Dragon, MD;  Location: [facility] ENDOSCOPY;  Service: Endoscopy;  Laterality: N/A;  .  ex lap with peri gastric vessel ligation  1990s   after retching, he rupture a blood vessel "outside" the stomach.  vessel was ligated per pt's descriiption.   Fatima Blank HERNIA REPAIR Right 10/21/2016   Procedure: OPEN FLANK HERNIA REPAIR WITH MESH;  Surgeon: Ralene Ok, MD;  Location: [place];  Service: General;  Laterality: Right;  . INSERTION OF MESH Right 10/21/2016   Procedure: INSERTION OF MESH;  Surgeon: Ralene Ok, MD;  Location: [place];  Service: General;  Laterality: Right;  . KNEE SURGERY     Multiple bilateral scopes  . RETINAL DETACHMENT SURGERY Right   . SHOULDER SURGERY     4x on the left - total shoulder replacement.   Marland Kitchen SHOULDER SURGERY Left    x2  . TOTAL SHOULDER ARTHROPLASTY Left    x2    Allergies  Allergen Reactions  . Fentanyl Other (See Comments)    Hallucinations (with the patch)   . Lisinopril Swelling  . Lyrica [Pregabalin]     hallucinations  . Sulfa Antibiotics Swelling    Family History  Problem Relation Age of Onset  . Arthritis Mother   . Hyperlipidemia Mother   . Hypertension Father   . Colon cancer Maternal Grandmother      Social History   Social History  . Marital status: Unknown    Spouse name: N/A  . Number of  children: 0  . Years of education: 3   Occupational History  . Disability     Former Therapist, sports   Social History Main Topics  . Smoking status: Current Every Day Smoker    Years: 24.00    Types: Cigarettes  . Smokeless tobacco: Never Used  . Alcohol use 0.0 oz/week     Comment: rarely  . Drug use: No  . Sexual activity: Not on file   Other Topics Concern  . Not on file   Social History Narrative   Fun: play guitar / limited funds      Denies any religious beliefs effecting health care.      PHYSICAL EXAM:  VS: BP 130/86 (BP Location: Left Arm, Patient Position: Sitting, Cuff Size: Normal)   Pulse 79   Temp 98.4 F (36.9 C) (Oral)   Ht 5\' 11"  (1.803 m)   Wt 179 lb (81.2 kg)   SpO2 98%   BMI 24.97  kg/m  Physical Exam Gen: NAD, alert, cooperative with exam, well-appearing ENT: normal lips, normal nasal mucosa,  Eye: normal EOM, normal conjunctiva and lids CV:  no edema, +2 pedal pulses   Resp: no accessory muscle use, non-labored,  Skin: no rashes, no areas of induration  Neuro: normal tone, normal sensation to touch Psych:  normal insight, alert and oriented MSK:  Left foot: Some swelling over the lateral aspect of the fifth MTP joint. Tenderness to palpation of the fifth MTP joint. No tenderness to palpation of the base of the fifth metatarsal. Normal foot range of motion. Normal gait. Neurovascularly intact.  Limited ultrasound: Left foot:   Significant soft tissue swelling between the fourth and fifth metacarpal. The proximal phalanx appears to be mildly displaced near the MTP joint The fifth and fourth metatarsal are normal in appearance.  Summary: Findings are suggestive of a mildly displaced proximal phalanx fracture of the fifth phalanx  Ultrasound and interpretation by Clearance Coots, MD                ASSESSMENT & PLAN:   Closed physeal fracture of proximal phalanx of lesser toe of left foot with routine healing Acute worsening problem. And apparently reviewed ultrasound and perform this. Exam seems to be consistent with the proximal phalanx fracture of the fifth phalanx. He is on chronic narcotics due to significant back history with multiple surgeries. He can continue this for his pain relief. - Provided a postop shoe today. - Headed discussion about x-ray. Would not likely change the management at this time. Can always obtain an x-ray in the future. - Follow-up in 3-4 weeks. Can ultrasound at that time.

## 2017-07-05 ENCOUNTER — Ambulatory Visit: Payer: Medicare Other | Admitting: Psychology

## 2017-07-06 ENCOUNTER — Other Ambulatory Visit: Payer: Self-pay | Admitting: Family

## 2017-07-06 DIAGNOSIS — I10 Essential (primary) hypertension: Secondary | ICD-10-CM

## 2017-07-07 ENCOUNTER — Other Ambulatory Visit: Payer: Self-pay

## 2017-07-07 DIAGNOSIS — J321 Chronic frontal sinusitis: Secondary | ICD-10-CM

## 2017-07-07 MED ORDER — FLUTICASONE PROPIONATE 50 MCG/ACT NA SUSP: 2.0000 | Freq: Every day | NASAL | 3 refills | Status: DC

## 2017-07-12 ENCOUNTER — Encounter (INDEPENDENT_AMBULATORY_CARE_PROVIDER_SITE_OTHER): Payer: Self-pay | Admitting: Family

## 2017-07-12 ENCOUNTER — Ambulatory Visit (INDEPENDENT_AMBULATORY_CARE_PROVIDER_SITE_OTHER): Payer: Medicare Other

## 2017-07-12 ENCOUNTER — Ambulatory Visit: Payer: Medicare Other | Admitting: Psychology

## 2017-07-12 ENCOUNTER — Ambulatory Visit (INDEPENDENT_AMBULATORY_CARE_PROVIDER_SITE_OTHER): Payer: Medicare Other | Admitting: Family

## 2017-07-12 VITALS — Ht 71.0 in | Wt 179.0 lb

## 2017-07-12 DIAGNOSIS — M79672 Pain in left foot: Secondary | ICD-10-CM

## 2017-07-12 DIAGNOSIS — S99202D Unspecified physeal fracture of phalanx of left toe, subsequent encounter for fracture with routine healing: Secondary | ICD-10-CM | POA: Diagnosis not present

## 2017-07-12 NOTE — Progress Notes (Signed)
Office Visit Note   Patient: Jeremiah Anderson           Date of Birth: [birth year]           MRN: [id number] Visit Date: 07/12/2017              Requested by: [address], [address] [place], [place] [address] PCP: [facility], FNP  Chief Complaint  Patient presents with  . Left Foot - Pain    Rolled foot about 3-4 weeks ago. Acute onset pain      HPI: The patient is a 51 year old gentleman who presents today for evaluation a left hip fracture. He states he twisted his foot was trying, chronic and twisted between the strap ago. He has had intermittent swelling over the lateral aspect of his foot and tenderness to the fifth toe since. Today is full weightbearing in postop shoe. States he just wanted to make sure things were not out of alignment. Did have some ultrasound imaging with his primary care has not had radiographs prior to today.  Assessment & Plan: Visit Diagnoses:  1. Pain in left foot   2. Closed physeal fracture of proximal phalanx of lesser toe of left foot with routine healing, unspecified physeal fracture configuration, subsequent encounter     Plan: May resume regular shoewear as tolerated increase activities as tolerated follow-up in office as needed.  Follow-Up Instructions: Return in about 2 weeks (around 07/26/2017), or if symptoms worsen or fail to improve.   Ortho Exam  Patient is alert, oriented, no adenopathy, well-dressed, normal affect, normal respiratory effort. On examination of the left foot there is no appreciable swelling no erythema. Does have a palpable dorsalis pedis pulse. The proximal fifth toe is mildly tender. ROM and strength of foot intact.  Imaging: Xr Foot 2 Views Left  Result Date: 07/12/2017 Radiographs of the left foot show stable alignment of 5th toe proximal phalanx fracture, minimal impaction, interval callus formation.  No images are attached to the encounter.  Labs: No results found for: HGBA1C, ESRSEDRATE, CRP,  LABURIC, REPTSTATUS, GRAMSTAIN, CULT, LABORGA  Orders:  Orders Placed This Encounter  Procedures  . XR Foot 2 Views Left   No orders of the defined types were placed in this encounter.    Procedures: No procedures performed  Clinical Data: No additional findings.  ROS:  All other systems negative, except as noted in the HPI. Review of Systems  Constitutional: Negative for chills and fever.  Musculoskeletal: Positive for arthralgias and joint swelling.  Skin: Negative for color change.    Objective: Vital Signs: Ht 5\' 11"  (1.803 m)   Wt 179 lb (81.2 kg)   BMI 24.97 kg/m   Specialty Comments:  No specialty comments available.  PMFS History: Patient Active Problem List   Diagnosis Date Noted  . Closed physeal fracture of proximal phalanx of lesser toe of left foot with routine healing 07/04/2017  . Medicare annual wellness visit, subsequent 01/27/2017  . S/P hernia repair 10/21/2016  . Painful penile erection 10/06/2016  . Ingrown toenail 09/26/2016  . Flank pain 07/12/2016  . Right-sided chest wall pain 07/12/2016  . Abscess of nasal cavity 06/14/2016  . Right flank mass 06/14/2016  . Tobacco use disorder 06/14/2016  . Overweight (BMI 25.0-29.9) 06/14/2016  . Skin exam, screening for cancer 04/04/2016  . Sacral contusion 12/18/2015  . Sinusitis, chronic 08/12/2015  . Arthralgia 07/06/2015  . Pill-induced gastritis 06/10/2015  . Chronic back pain 06/10/2015  .  Multiple duodenal ulcers 06/10/2015  . Nausea & vomiting 05/27/2015  . Rash 05/07/2015  . Hyperlipidemia 05/07/2015  . Migraines 04/21/2015  . 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 Mood change (Pump Back) 03/19/2015  . Rib pain on right side 02/16/2015  . Essential hypertension 02/16/2015  . Low back pain 02/16/2015   Past Medical History:  Diagnosis Date  . Arthritis   . GERD (gastroesophageal reflux disease)   . Hyperlipidemia   . Hypertension   . Migraines   . Wears glasses     Family  History  Problem Relation Age of Onset  . Arthritis Mother   . Hyperlipidemia Mother   . Hypertension Father   . Colon cancer Maternal Grandmother     Past Surgical History:  Procedure Laterality Date  . APPENDECTOMY  1980s   post op bleeding sent him back for ex lap and hemostatic intervention.   Marland Kitchen BACK SURGERY     8 surgeries total - fused from L1-S1  . COLONOSCOPY     Age 65. For bleeding  . ESOPHAGOGASTRODUODENOSCOPY N/A 06/10/2015   Procedure: ESOPHAGOGASTRODUODENOSCOPY (EGD);  Surgeon: Lafayette Dragon, MD;  Location: [facility] ENDOSCOPY;  Service: Endoscopy;  Laterality: N/A;  . ex lap with peri gastric vessel ligation  1990s   after retching, he rupture a blood vessel "outside" the stomach.  vessel was ligated per pt's descriiption.   Fatima Blank HERNIA REPAIR Right 10/21/2016   Procedure: OPEN FLANK HERNIA REPAIR WITH MESH;  Surgeon: Ralene Ok, MD;  Location: [place];  Service: General;  Laterality: Right;  . INSERTION OF MESH Right 10/21/2016   Procedure: INSERTION OF MESH;  Surgeon: Ralene Ok, MD;  Location: [place];  Service: General;  Laterality: Right;  . KNEE SURGERY     Multiple bilateral scopes  . RETINAL DETACHMENT SURGERY Right   . SHOULDER SURGERY     4x on the left - total shoulder replacement.   Marland Kitchen SHOULDER SURGERY Left    x2  . TOTAL SHOULDER ARTHROPLASTY Left    x2   Social History   Occupational History  . Disability     Former Therapist, sports   Social History Main Topics  . Smoking status: Current Every Day Smoker    Years: 24.00    Types: Cigarettes  . Smokeless tobacco: Never Used  . Alcohol use 0.0 oz/week     Comment: rarely  . Drug use: No  . Sexual activity: Not on file

## 2017-07-19 DIAGNOSIS — Z79899 Other long term (current) drug therapy: Secondary | ICD-10-CM | POA: Diagnosis not present

## 2017-07-23 ENCOUNTER — Other Ambulatory Visit: Payer: Self-pay | Admitting: Family

## 2017-07-25 DIAGNOSIS — Z79899 Other long term (current) drug therapy: Secondary | ICD-10-CM | POA: Diagnosis not present

## 2017-07-27 DIAGNOSIS — G894 Chronic pain syndrome: Secondary | ICD-10-CM | POA: Diagnosis not present

## 2017-07-27 DIAGNOSIS — M4722 Other spondylosis with radiculopathy, cervical region: Secondary | ICD-10-CM | POA: Diagnosis not present

## 2017-07-27 DIAGNOSIS — M961 Postlaminectomy syndrome, not elsewhere classified: Secondary | ICD-10-CM | POA: Diagnosis not present

## 2017-07-27 DIAGNOSIS — Z79891 Long term (current) use of opiate analgesic: Secondary | ICD-10-CM | POA: Diagnosis not present

## 2017-08-05 DIAGNOSIS — Z79899 Other long term (current) drug therapy: Secondary | ICD-10-CM | POA: Diagnosis not present

## 2017-08-14 DIAGNOSIS — M25561 Pain in right knee: Secondary | ICD-10-CM | POA: Diagnosis not present

## 2017-08-14 DIAGNOSIS — M2241 Chondromalacia patellae, right knee: Secondary | ICD-10-CM | POA: Diagnosis not present

## 2017-08-14 DIAGNOSIS — M7651 Patellar tendinitis, right knee: Secondary | ICD-10-CM | POA: Diagnosis not present

## 2017-08-16 DIAGNOSIS — M545 Low back pain: Secondary | ICD-10-CM | POA: Diagnosis not present

## 2017-08-16 DIAGNOSIS — M546 Pain in thoracic spine: Secondary | ICD-10-CM | POA: Diagnosis not present

## 2017-08-16 DIAGNOSIS — R531 Weakness: Secondary | ICD-10-CM | POA: Diagnosis not present

## 2017-08-16 DIAGNOSIS — M5136 Other intervertebral disc degeneration, lumbar region: Secondary | ICD-10-CM | POA: Diagnosis not present

## 2017-08-17 DIAGNOSIS — M546 Pain in thoracic spine: Secondary | ICD-10-CM | POA: Diagnosis not present

## 2017-08-17 DIAGNOSIS — M5136 Other intervertebral disc degeneration, lumbar region: Secondary | ICD-10-CM | POA: Diagnosis not present

## 2017-08-17 DIAGNOSIS — R531 Weakness: Secondary | ICD-10-CM | POA: Diagnosis not present

## 2017-08-17 DIAGNOSIS — M545 Low back pain: Secondary | ICD-10-CM | POA: Diagnosis not present

## 2017-08-18 DIAGNOSIS — M7651 Patellar tendinitis, right knee: Secondary | ICD-10-CM | POA: Insufficient documentation

## 2017-08-21 ENCOUNTER — Other Ambulatory Visit: Payer: Self-pay | Admitting: Family

## 2017-08-21 DIAGNOSIS — G894 Chronic pain syndrome: Secondary | ICD-10-CM | POA: Diagnosis not present

## 2017-08-21 DIAGNOSIS — Z79891 Long term (current) use of opiate analgesic: Secondary | ICD-10-CM | POA: Diagnosis not present

## 2017-08-21 DIAGNOSIS — M4722 Other spondylosis with radiculopathy, cervical region: Secondary | ICD-10-CM | POA: Diagnosis not present

## 2017-08-21 DIAGNOSIS — Z79899 Other long term (current) drug therapy: Secondary | ICD-10-CM | POA: Diagnosis not present

## 2017-08-21 DIAGNOSIS — M961 Postlaminectomy syndrome, not elsewhere classified: Secondary | ICD-10-CM | POA: Diagnosis not present

## 2017-08-22 DIAGNOSIS — R531 Weakness: Secondary | ICD-10-CM | POA: Diagnosis not present

## 2017-08-22 DIAGNOSIS — M545 Low back pain: Secondary | ICD-10-CM | POA: Diagnosis not present

## 2017-08-22 DIAGNOSIS — M546 Pain in thoracic spine: Secondary | ICD-10-CM | POA: Diagnosis not present

## 2017-08-22 DIAGNOSIS — M5136 Other intervertebral disc degeneration, lumbar region: Secondary | ICD-10-CM | POA: Diagnosis not present

## 2017-08-24 DIAGNOSIS — M546 Pain in thoracic spine: Secondary | ICD-10-CM | POA: Diagnosis not present

## 2017-08-24 DIAGNOSIS — R531 Weakness: Secondary | ICD-10-CM | POA: Diagnosis not present

## 2017-08-24 DIAGNOSIS — M5136 Other intervertebral disc degeneration, lumbar region: Secondary | ICD-10-CM | POA: Diagnosis not present

## 2017-08-24 DIAGNOSIS — M545 Low back pain: Secondary | ICD-10-CM | POA: Diagnosis not present

## 2017-08-25 DIAGNOSIS — M4722 Other spondylosis with radiculopathy, cervical region: Secondary | ICD-10-CM | POA: Diagnosis not present

## 2017-08-25 DIAGNOSIS — Z79891 Long term (current) use of opiate analgesic: Secondary | ICD-10-CM | POA: Diagnosis not present

## 2017-08-25 DIAGNOSIS — G894 Chronic pain syndrome: Secondary | ICD-10-CM | POA: Diagnosis not present

## 2017-08-25 DIAGNOSIS — M961 Postlaminectomy syndrome, not elsewhere classified: Secondary | ICD-10-CM | POA: Diagnosis not present

## 2017-08-29 DIAGNOSIS — R531 Weakness: Secondary | ICD-10-CM | POA: Diagnosis not present

## 2017-08-29 DIAGNOSIS — M545 Low back pain: Secondary | ICD-10-CM | POA: Diagnosis not present

## 2017-08-29 DIAGNOSIS — M5136 Other intervertebral disc degeneration, lumbar region: Secondary | ICD-10-CM | POA: Diagnosis not present

## 2017-08-29 DIAGNOSIS — M546 Pain in thoracic spine: Secondary | ICD-10-CM | POA: Diagnosis not present

## 2017-08-31 DIAGNOSIS — M5136 Other intervertebral disc degeneration, lumbar region: Secondary | ICD-10-CM | POA: Diagnosis not present

## 2017-08-31 DIAGNOSIS — M546 Pain in thoracic spine: Secondary | ICD-10-CM | POA: Diagnosis not present

## 2017-08-31 DIAGNOSIS — R531 Weakness: Secondary | ICD-10-CM | POA: Diagnosis not present

## 2017-08-31 DIAGNOSIS — M545 Low back pain: Secondary | ICD-10-CM | POA: Diagnosis not present

## 2017-09-05 DIAGNOSIS — M5136 Other intervertebral disc degeneration, lumbar region: Secondary | ICD-10-CM | POA: Diagnosis not present

## 2017-09-05 DIAGNOSIS — M546 Pain in thoracic spine: Secondary | ICD-10-CM | POA: Diagnosis not present

## 2017-09-05 DIAGNOSIS — M545 Low back pain: Secondary | ICD-10-CM | POA: Diagnosis not present

## 2017-09-05 DIAGNOSIS — R531 Weakness: Secondary | ICD-10-CM | POA: Diagnosis not present

## 2017-09-06 ENCOUNTER — Other Ambulatory Visit: Payer: Self-pay | Admitting: Family

## 2017-09-06 DIAGNOSIS — J321 Chronic frontal sinusitis: Secondary | ICD-10-CM

## 2017-09-07 DIAGNOSIS — M5136 Other intervertebral disc degeneration, lumbar region: Secondary | ICD-10-CM | POA: Diagnosis not present

## 2017-09-07 DIAGNOSIS — M546 Pain in thoracic spine: Secondary | ICD-10-CM | POA: Diagnosis not present

## 2017-09-07 DIAGNOSIS — R531 Weakness: Secondary | ICD-10-CM | POA: Diagnosis not present

## 2017-09-07 DIAGNOSIS — M545 Low back pain: Secondary | ICD-10-CM | POA: Diagnosis not present

## 2017-09-11 ENCOUNTER — Ambulatory Visit (INDEPENDENT_AMBULATORY_CARE_PROVIDER_SITE_OTHER): Payer: Medicare Other | Admitting: Family Medicine

## 2017-09-11 ENCOUNTER — Encounter: Payer: Self-pay | Admitting: Family Medicine

## 2017-09-11 VITALS — BP 148/90 | HR 89 | Temp 98.4°F | Wt 184.0 lb

## 2017-09-11 DIAGNOSIS — R21 Rash and other nonspecific skin eruption: Secondary | ICD-10-CM | POA: Diagnosis not present

## 2017-09-11 DIAGNOSIS — M4722 Other spondylosis with radiculopathy, cervical region: Secondary | ICD-10-CM | POA: Diagnosis not present

## 2017-09-11 DIAGNOSIS — Z79891 Long term (current) use of opiate analgesic: Secondary | ICD-10-CM | POA: Diagnosis not present

## 2017-09-11 DIAGNOSIS — M961 Postlaminectomy syndrome, not elsewhere classified: Secondary | ICD-10-CM | POA: Diagnosis not present

## 2017-09-11 DIAGNOSIS — G894 Chronic pain syndrome: Secondary | ICD-10-CM | POA: Diagnosis not present

## 2017-09-11 NOTE — Assessment & Plan Note (Signed)
This appears to be shingles.  - advised to cover the areas if having exposure to other people - counseled on pain management.  - advised to get shingles vaccine in 6 months.

## 2017-09-11 NOTE — Patient Instructions (Signed)
Thank you for coming in,   Please let me know if these symptoms don't improve.    Please feel free to call with any questions or concerns at any time, at [phone]. --Dr. Raeford Razor   Shingles Shingles, which is also known as herpes zoster, is an infection that causes a painful skin rash and fluid-filled blisters. Shingles is not related to genital herpes, which is a sexually transmitted infection. Shingles only develops in people who:  Have had chickenpox.  Have received the chickenpox vaccine. (This is rare.)  What are the causes? Shingles is caused by varicella-zoster virus (VZV). This is the same virus that causes chickenpox. After exposure to VZV, the virus stays in the body in an inactive (dormant) state. Shingles develops if the virus reactivates. This can happen many years after the initial exposure to VZV. It is not known what causes this virus to reactivate. What increases the risk? People who have had chickenpox or received the chickenpox vaccine are at risk for shingles. Infection is more common in people who:  Are older than age 49.  Have a weakened defense (immune) system, such as those with HIV, AIDS, or cancer.  Are taking medicines that weaken the immune system, such as transplant medicines.  Are under great stress.  What are the signs or symptoms? Early symptoms of this condition include itching, tingling, and pain in an area on your skin. Pain may be described as burning, stabbing, or throbbing. A few days or weeks after symptoms start, a painful red rash appears, usually on one side of the body in a bandlike or beltlike pattern. The rash eventually turns into fluid-filled blisters that break open, scab over, and dry up in about 2-3 weeks. At any time during the infection, you may also develop:  A fever.  Chills.  A headache.  An upset stomach.  How is this diagnosed? This condition is diagnosed with a skin exam. Sometimes, skin or fluid samples are taken  from the blisters before a diagnosis is made. These samples are examined under a microscope or sent to a lab for testing. How is this treated? There is no specific cure for this condition. Your health care provider will probably prescribe medicines to help you manage pain, recover more quickly, and avoid long-term problems. Medicines may include:  Antiviral drugs.  Anti-inflammatory drugs.  Pain medicines.  If the area involved is on your face, you may be referred to a specialist, such as an eye doctor (ophthalmologist) or an ear, nose, and throat (ENT) doctor to help you avoid eye problems, chronic pain, or disability. Follow these instructions at home: Medicines  Take medicines only as directed by your health care provider.  Apply an anti-itch or numbing cream to the affected area as directed by your health care provider. Blister and Rash Care  Take a cool bath or apply cool compresses to the area of the rash or blisters as directed by your health care provider. This may help with pain and itching.  Keep your rash covered with a loose bandage (dressing). Wear loose-fitting clothing to help ease the pain of material rubbing against the rash.  Keep your rash and blisters clean with mild soap and cool water or as directed by your health care provider.  Check your rash every day for signs of infection. These include redness, swelling, and pain that lasts or increases.  Do not pick your blisters.  Do not scratch your rash. General instructions  Rest as directed by your health  care provider.  Keep all follow-up visits as directed by your health care provider. This is important.  Until your blisters scab over, your infection can cause chickenpox in people who have never had it or been vaccinated against it. To prevent this from happening, avoid contact with other people, especially: ? Babies. ? Pregnant women. ? Children who have eczema. ? Elderly people who have  transplants. ? People who have chronic illnesses, such as leukemia or AIDS. Contact a health care provider if:  Your pain is not relieved with prescribed medicines.  Your pain does not get better after the rash heals.  Your rash looks infected. Signs of infection include redness, swelling, and pain that lasts or increases. Get help right away if:  The rash is on your face or nose.  You have facial pain, pain around your eye area, or loss of feeling on one side of your face.  You have ear pain or you have ringing in your ear.  You have loss of taste.  Your condition gets worse. This information is not intended to replace advice given to you by your health care provider. Make sure you discuss any questions you have with your health care provider. Document Released: 11/07/2005 Document Revised: 07/03/2016 Document Reviewed: 09/18/2014 Elsevier Interactive Patient Education  2017 [employer].

## 2017-09-11 NOTE — Progress Notes (Signed)
Jeremiah Anderson - 51 y.o. male MRN [id number]  Date of birth: October 14, 1966  SUBJECTIVE:  Including CC & ROS.  Chief Complaint  Patient presents with  . Herpes Zoster    back, left side, noticed on Friday    Jeremiah Anderson is a 51 year old male is presenting with left-sided flank rash. The rash started on Friday. There is been minimal discomfort associated with that it is following a specific dermatome on his left side. He has a history of chickenpox has not received the shingles vaccine. There is no itching and he has not had any recent illnesses. Denies any fevers.     Review of Systems  Constitutional: Negative for fever.  Skin: Positive for rash.    HISTORY: Past Medical, Surgical, Social, and Family History Reviewed & Updated per EMR.   Pertinent Historical Findings include:  Past Medical History:  Diagnosis Date  . Arthritis   . GERD (gastroesophageal reflux disease)   . Hyperlipidemia   . Hypertension   . Migraines   . Wears glasses     Past Surgical History:  Procedure Laterality Date  . APPENDECTOMY  1980s   post op bleeding sent him back for ex lap and hemostatic intervention.   Marland Kitchen BACK SURGERY     8 surgeries total - fused from L1-S1  . COLONOSCOPY     Age 50. For bleeding  . ESOPHAGOGASTRODUODENOSCOPY N/A 06/10/2015   Procedure: ESOPHAGOGASTRODUODENOSCOPY (EGD);  Surgeon: Lafayette Dragon, MD;  Location: [facility] ENDOSCOPY;  Service: Endoscopy;  Laterality: N/A;  . ex lap with peri gastric vessel ligation  1990s   after retching, he rupture a blood vessel "outside" the stomach.  vessel was ligated per pt's descriiption.   Fatima Blank HERNIA REPAIR Right 10/21/2016   Procedure: OPEN FLANK HERNIA REPAIR WITH MESH;  Surgeon: Ralene Ok, MD;  Location: [place];  Service: General;  Laterality: Right;  . INSERTION OF MESH Right 10/21/2016   Procedure: INSERTION OF MESH;  Surgeon: Ralene Ok, MD;  Location: [place];  Service: General;  Laterality: Right;  . KNEE SURGERY     Multiple bilateral scopes  . RETINAL DETACHMENT SURGERY Right   . SHOULDER SURGERY     4x on the left - total shoulder replacement.   Marland Kitchen SHOULDER SURGERY Left    x2  . TOTAL SHOULDER ARTHROPLASTY Left    x2    Allergies  Allergen Reactions  . Fentanyl Other (See Comments)    Hallucinations (with the patch)   . Lisinopril Swelling  . Lyrica [Pregabalin]     hallucinations  . Sulfa Antibiotics Swelling    Family History  Problem Relation Age of Onset  . Arthritis Mother   . Hyperlipidemia Mother   . Hypertension Father   . Colon cancer Maternal Grandmother      Social History   Social History  . Marital status: Unknown    Spouse name: N/A  . Number of children: 0  . Years of education: 30   Occupational History  . Disability     Former Therapist, sports   Social History Main Topics  . Smoking status: Current Every Day Smoker    Years: 24.00    Types: Cigarettes  . Smokeless tobacco: Never Used  . Alcohol use 0.0 oz/week     Comment: rarely  . Drug use: No  . Sexual activity: Not on file   Other Topics Concern  . Not on file   Social History Narrative   Fun: play guitar / limited funds  Denies any religious beliefs effecting health care.      PHYSICAL EXAM:  VS: BP (!) 148/90   Pulse 89   Temp 98.4 F (36.9 C) (Oral)   Wt 184 lb (83.5 kg)   SpO2 100%   BMI 25.66 kg/m  Physical Exam Gen: NAD, alert, cooperative with exam, well-appearing ENT: normal lips, normal nasal mucosa,  Eye: normal EOM, normal conjunctiva and lids CV:  no edema, +2 pedal pulses   Resp: no accessory muscle use, non-labored,  GI: no masses or tenderness, no hernia  Skin: Vesicles formation on the left sided axillary region and a specific dermatome, no overlying streaking, no areas of induration  Neuro: normal tone, normal sensation to touch Psych:  normal insight, alert and oriented MSK: Normal gait, normal strength      ASSESSMENT & PLAN:   Rash This appears to be  shingles.  - advised to cover the areas if having exposure to other people - counseled on pain management.  - advised to get shingles vaccine in 6 months.

## 2017-09-13 DIAGNOSIS — Z79899 Other long term (current) drug therapy: Secondary | ICD-10-CM | POA: Diagnosis not present

## 2017-09-19 DIAGNOSIS — Z79899 Other long term (current) drug therapy: Secondary | ICD-10-CM | POA: Diagnosis not present

## 2017-09-20 ENCOUNTER — Ambulatory Visit (INDEPENDENT_AMBULATORY_CARE_PROVIDER_SITE_OTHER): Payer: Medicare Other | Admitting: Specialist

## 2017-09-20 ENCOUNTER — Ambulatory Visit (INDEPENDENT_AMBULATORY_CARE_PROVIDER_SITE_OTHER): Payer: Medicare Other

## 2017-09-20 ENCOUNTER — Encounter (INDEPENDENT_AMBULATORY_CARE_PROVIDER_SITE_OTHER): Payer: Self-pay | Admitting: Specialist

## 2017-09-20 VITALS — BP 146/90 | HR 85 | Ht 71.0 in | Wt 178.0 lb

## 2017-09-20 DIAGNOSIS — R531 Weakness: Secondary | ICD-10-CM | POA: Diagnosis not present

## 2017-09-20 DIAGNOSIS — M4005 Postural kyphosis, thoracolumbar region: Secondary | ICD-10-CM

## 2017-09-20 DIAGNOSIS — M545 Low back pain: Secondary | ICD-10-CM | POA: Diagnosis not present

## 2017-09-20 DIAGNOSIS — M546 Pain in thoracic spine: Secondary | ICD-10-CM | POA: Diagnosis not present

## 2017-09-20 DIAGNOSIS — M5136 Other intervertebral disc degeneration, lumbar region: Secondary | ICD-10-CM | POA: Diagnosis not present

## 2017-09-20 DIAGNOSIS — M542 Cervicalgia: Secondary | ICD-10-CM

## 2017-09-20 DIAGNOSIS — M4722 Other spondylosis with radiculopathy, cervical region: Secondary | ICD-10-CM

## 2017-09-20 DIAGNOSIS — M4325 Fusion of spine, thoracolumbar region: Secondary | ICD-10-CM | POA: Diagnosis not present
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
## 2017-09-20 NOTE — Progress Notes (Addendum)
Office Visit Note   Patient: Jeremiah Anderson           Date of Birth: Jan 22, 1966           MRN: [id number] Visit Date: 09/20/2017              Requested by: Jeremiah Anderson, [place], [place] [address] PCP: Jeremiah Circle, FNP   Assessment & Plan: Visit Diagnoses:  1. Cervicalgia   2. Other spondylosis with radiculopathy, cervical region   3. Fusion of spine of thoracolumbar region   4. Postural kyphosis of lumbar region   51 year old male with DDD and spondylosis at C5-6 and likely radicular symptoms into the left arm. I recommend PT to see if the pain may be improved with traction and core strengthening and McKenzie exercises. No focal motor deficit. The lumbar condition may see improvement with ESI and facet blocks could help determine is there is adjacent primary mechanical pain due to the T12-L1 facets. If facet block works then he may wish to try RFA of the facets, he relates that he had this in [place] previously and it helped. I recommended he discuss whether Jeremiah Anderson is comfortable performing this or if he would want to refer Jeremiah Anderson for  RFA. I explained to Jeremiah Anderson that I do not perform RFA and if Jeremiah Anderson is not performing these then he could be seen by Jeremiah Anderson for consideration of repeat RFA at the thoracolumbar junction.  Plan: Avoid overhead lifting and overhead use of the arms. Do not lift greater than 5 lbs. Adjust head rest in vehicle to prevent hyperextension if rear ended. Start PT for the cervical spine. Talk to Jeremiah Anderson about having RFA of the upper joints adjacent to your fusions and if he  Is willing to allow [place] to schedule we would do this as an attempt to improve your pain.  Follow-Up Instructions: Return in about 4 years (around 09/20/2021).   Orders:  Orders Placed This Encounter  Procedures  . XR Cervical Spine 2 or 3 views   No orders of the defined types were placed in this encounter.      Procedures: No procedures performed   Clinical Data: No additional findings.   Subjective: Chief Complaint  Patient presents with  . Neck - Pain    Pain in neck and numbness in hands/fingers.  . Lower Back - Follow-up    Doing fine at this time    51 year old right handed male with symptoms of numbness into the left thumb and radial hand. His pain is worse with extension of the neck and with right sided turning and left sided Bending of the neck. He has been to PT for his lumbar spine and has a significant lumbar fusion from L1 to SI with interbody fusions and posterior instrumentation. Much of his pain today is  Neck and left arm but he also has pain at the upper lumbar and thoracolumbar junction. The upper lumbar and thoracolumbar pain worsening and he notes that Jeremiah Anderson is prepared to give Him an ESI to help with this pain. He also indicates the Jeremiah Anderson may not be able to perform a RFA of the adjacent upper level facets. No bowel or bladder difficulties.     Review of Systems   Objective: Vital Signs: BP (!) 146/90 (BP Location: Left Arm, Patient Position: Sitting)   Pulse 85   Ht 5\' 11"  (1.803 m)  Wt 178 lb (80.7 kg)   BMI 24.83 kg/m   Physical Exam  Ortho Exam  Specialty Comments:  No specialty comments available.  Imaging: Xr Cervical Spine 2 Or 3 Views  Result Date: 09/20/2017 DDD C5-6 with disc narrowing and minimal left uncovertebral joint enlargement, mild on the right side.  Anterior spurs at C5-6, minimal narrowing of the AP diameter of the space available for the cord C5-6, remaining disc are normal appearing and cervical lordosis is well maintained.     PMFS History: Patient Active Problem List   Diagnosis Date Noted  . Closed physeal fracture of proximal phalanx of lesser toe of left foot with routine healing 07/04/2017  . Medicare annual wellness visit, subsequent 01/27/2017  . S/P hernia repair 10/21/2016  . Painful penile erection  10/06/2016  . Ingrown toenail 09/26/2016  . Flank pain 07/12/2016  . Right-sided chest wall pain 07/12/2016  . Abscess of nasal cavity 06/14/2016  . Right flank mass 06/14/2016  . Tobacco use disorder 06/14/2016  . Overweight (BMI 25.0-29.9) 06/14/2016  . Skin exam, screening for cancer 04/04/2016  . Sacral contusion 12/18/2015  . Sinusitis, chronic 08/12/2015  . Arthralgia 07/06/2015  . Pill-induced gastritis 06/10/2015  . Chronic back pain 06/10/2015  . Multiple duodenal ulcers 06/10/2015  . Nausea & vomiting 05/27/2015  . Rash 05/07/2015  . Hyperlipidemia 05/07/2015  . Migraines 04/21/2015  . 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 Mood change 03/19/2015  . Rib pain on right side 02/16/2015  . Essential hypertension 02/16/2015  . Low back pain 02/16/2015   Past Medical History:  Diagnosis Date  . Arthritis   . GERD (gastroesophageal reflux disease)   . Hyperlipidemia   . Hypertension   . Migraines   . Wears glasses     Family History  Problem Relation Age of Onset  . Arthritis Mother   . Hyperlipidemia Mother   . Hypertension Father   . Colon cancer Maternal Grandmother     Past Surgical History:  Procedure Laterality Date  . APPENDECTOMY  1980s   post op bleeding sent him back for ex lap and hemostatic intervention.   Marland Kitchen BACK SURGERY     8 surgeries total - fused from L1-S1  . COLONOSCOPY     Age 33. For bleeding  . ESOPHAGOGASTRODUODENOSCOPY N/A 06/10/2015   Procedure: ESOPHAGOGASTRODUODENOSCOPY (EGD);  Surgeon: Lafayette Dragon, MD;  Location: [facility] ENDOSCOPY;  Service: Endoscopy;  Laterality: N/A;  . ex lap with peri gastric vessel ligation  1990s   after retching, he rupture a blood vessel "outside" the stomach.  vessel was ligated per pt's descriiption.   Fatima Blank HERNIA REPAIR Right 10/21/2016   Procedure: OPEN FLANK HERNIA REPAIR WITH MESH;  Surgeon: Ralene Ok, MD;  Location: [place];  Service: General;  Laterality: Right;  . INSERTION OF MESH Right  10/21/2016   Procedure: INSERTION OF MESH;  Surgeon: Ralene Ok, MD;  Location: [place];  Service: General;  Laterality: Right;  . KNEE SURGERY     Multiple bilateral scopes  . RETINAL DETACHMENT SURGERY Right   . SHOULDER SURGERY     4x on the left - total shoulder replacement.   Marland Kitchen SHOULDER SURGERY Left    x2  . TOTAL SHOULDER ARTHROPLASTY Left    x2   Social History   Occupational History  . Disability     Former Therapist, sports   Social History Main Topics  . Smoking status: Current Every Day Smoker    Years: 24.00  Types: Cigarettes  . Smokeless tobacco: Never Used  . Alcohol use 0.0 oz/week     Comment: rarely  . Drug use: No  . Sexual activity: Not on file

## 2017-09-22 DIAGNOSIS — M961 Postlaminectomy syndrome, not elsewhere classified: Secondary | ICD-10-CM | POA: Diagnosis not present

## 2017-09-22 DIAGNOSIS — M545 Low back pain: Secondary | ICD-10-CM | POA: Diagnosis not present

## 2017-09-22 DIAGNOSIS — R531 Weakness: Secondary | ICD-10-CM | POA: Diagnosis not present

## 2017-09-22 DIAGNOSIS — G894 Chronic pain syndrome: Secondary | ICD-10-CM | POA: Diagnosis not present

## 2017-09-22 DIAGNOSIS — M4722 Other spondylosis with radiculopathy, cervical region: Secondary | ICD-10-CM | POA: Diagnosis not present

## 2017-09-22 DIAGNOSIS — Z79891 Long term (current) use of opiate analgesic: Secondary | ICD-10-CM | POA: Diagnosis not present

## 2017-09-22 DIAGNOSIS — M546 Pain in thoracic spine: Secondary | ICD-10-CM | POA: Diagnosis not present

## 2017-09-22 DIAGNOSIS — M5136 Other intervertebral disc degeneration, lumbar region: Secondary | ICD-10-CM | POA: Diagnosis not present

## 2017-09-27 DIAGNOSIS — R531 Weakness: Secondary | ICD-10-CM | POA: Diagnosis not present

## 2017-09-27 DIAGNOSIS — M5136 Other intervertebral disc degeneration, lumbar region: Secondary | ICD-10-CM | POA: Diagnosis not present

## 2017-09-27 DIAGNOSIS — M546 Pain in thoracic spine: Secondary | ICD-10-CM | POA: Diagnosis not present

## 2017-09-27 DIAGNOSIS — M545 Low back pain: Secondary | ICD-10-CM | POA: Diagnosis not present

## 2017-09-29 DIAGNOSIS — M545 Low back pain: Secondary | ICD-10-CM | POA: Diagnosis not present

## 2017-09-29 DIAGNOSIS — R531 Weakness: Secondary | ICD-10-CM | POA: Diagnosis not present

## 2017-09-29 DIAGNOSIS — M546 Pain in thoracic spine: Secondary | ICD-10-CM | POA: Diagnosis not present

## 2017-09-29 DIAGNOSIS — M5136 Other intervertebral disc degeneration, lumbar region: Secondary | ICD-10-CM | POA: Diagnosis not present

## 2017-10-02 DIAGNOSIS — M4722 Other spondylosis with radiculopathy, cervical region: Secondary | ICD-10-CM | POA: Diagnosis not present

## 2017-10-02 DIAGNOSIS — M5412 Radiculopathy, cervical region: Secondary | ICD-10-CM | POA: Diagnosis not present

## 2017-10-05 DIAGNOSIS — M545 Low back pain: Secondary | ICD-10-CM | POA: Diagnosis not present

## 2017-10-05 DIAGNOSIS — R531 Weakness: Secondary | ICD-10-CM | POA: Diagnosis not present

## 2017-10-05 DIAGNOSIS — M5136 Other intervertebral disc degeneration, lumbar region: Secondary | ICD-10-CM | POA: Diagnosis not present

## 2017-10-05 DIAGNOSIS — M546 Pain in thoracic spine: Secondary | ICD-10-CM | POA: Diagnosis not present

## 2017-10-09 DIAGNOSIS — M5136 Other intervertebral disc degeneration, lumbar region: Secondary | ICD-10-CM | POA: Diagnosis not present

## 2017-10-09 DIAGNOSIS — M545 Low back pain: Secondary | ICD-10-CM | POA: Diagnosis not present

## 2017-10-09 DIAGNOSIS — M546 Pain in thoracic spine: Secondary | ICD-10-CM | POA: Diagnosis not present

## 2017-10-09 DIAGNOSIS — R531 Weakness: Secondary | ICD-10-CM | POA: Diagnosis not present

## 2017-10-17 DIAGNOSIS — M546 Pain in thoracic spine: Secondary | ICD-10-CM | POA: Diagnosis not present

## 2017-10-17 DIAGNOSIS — M5136 Other intervertebral disc degeneration, lumbar region: Secondary | ICD-10-CM | POA: Diagnosis not present

## 2017-10-17 DIAGNOSIS — R531 Weakness: Secondary | ICD-10-CM | POA: Diagnosis not present

## 2017-10-17 DIAGNOSIS — M545 Low back pain: Secondary | ICD-10-CM | POA: Diagnosis not present

## 2017-10-20 DIAGNOSIS — M4722 Other spondylosis with radiculopathy, cervical region: Secondary | ICD-10-CM | POA: Diagnosis not present

## 2017-10-20 DIAGNOSIS — G894 Chronic pain syndrome: Secondary | ICD-10-CM | POA: Diagnosis not present

## 2017-10-20 DIAGNOSIS — Z79891 Long term (current) use of opiate analgesic: Secondary | ICD-10-CM | POA: Diagnosis not present

## 2017-10-20 DIAGNOSIS — M961 Postlaminectomy syndrome, not elsewhere classified: Secondary | ICD-10-CM | POA: Diagnosis not present

## 2017-10-23 DIAGNOSIS — M5136 Other intervertebral disc degeneration, lumbar region: Secondary | ICD-10-CM | POA: Diagnosis not present

## 2017-10-23 DIAGNOSIS — M545 Low back pain: Secondary | ICD-10-CM | POA: Diagnosis not present

## 2017-10-23 DIAGNOSIS — M546 Pain in thoracic spine: Secondary | ICD-10-CM | POA: Diagnosis not present

## 2017-10-23 DIAGNOSIS — R531 Weakness: Secondary | ICD-10-CM | POA: Diagnosis not present

## 2017-10-26 DIAGNOSIS — M546 Pain in thoracic spine: Secondary | ICD-10-CM | POA: Diagnosis not present

## 2017-10-26 DIAGNOSIS — M5136 Other intervertebral disc degeneration, lumbar region: Secondary | ICD-10-CM | POA: Diagnosis not present

## 2017-10-26 DIAGNOSIS — R531 Weakness: Secondary | ICD-10-CM | POA: Diagnosis not present

## 2017-10-26 DIAGNOSIS — M545 Low back pain: Secondary | ICD-10-CM | POA: Diagnosis not present

## 2017-10-30 ENCOUNTER — Ambulatory Visit (INDEPENDENT_AMBULATORY_CARE_PROVIDER_SITE_OTHER): Payer: Medicare Other | Admitting: Specialist

## 2017-11-02 ENCOUNTER — Other Ambulatory Visit: Payer: Self-pay | Admitting: *Deleted

## 2017-11-02 DIAGNOSIS — J321 Chronic frontal sinusitis: Secondary | ICD-10-CM

## 2017-11-02 MED ORDER — FLUTICASONE PROPIONATE 50 MCG/ACT NA SUSP: 2.0000 | Freq: Every day | NASAL | 0 refills | Status: DC

## 2017-11-03 DIAGNOSIS — M5136 Other intervertebral disc degeneration, lumbar region: Secondary | ICD-10-CM | POA: Diagnosis not present

## 2017-11-03 DIAGNOSIS — R531 Weakness: Secondary | ICD-10-CM | POA: Diagnosis not present

## 2017-11-03 DIAGNOSIS — M545 Low back pain: Secondary | ICD-10-CM | POA: Diagnosis not present

## 2017-11-03 DIAGNOSIS — M546 Pain in thoracic spine: Secondary | ICD-10-CM | POA: Diagnosis not present

## 2017-11-07 DIAGNOSIS — M5136 Other intervertebral disc degeneration, lumbar region: Secondary | ICD-10-CM | POA: Diagnosis not present

## 2017-11-07 DIAGNOSIS — R531 Weakness: Secondary | ICD-10-CM | POA: Diagnosis not present

## 2017-11-07 DIAGNOSIS — M546 Pain in thoracic spine: Secondary | ICD-10-CM | POA: Diagnosis not present

## 2017-11-07 DIAGNOSIS — M545 Low back pain: Secondary | ICD-10-CM | POA: Diagnosis not present

## 2017-11-09 DIAGNOSIS — M5136 Other intervertebral disc degeneration, lumbar region: Secondary | ICD-10-CM | POA: Diagnosis not present

## 2017-11-09 DIAGNOSIS — M546 Pain in thoracic spine: Secondary | ICD-10-CM | POA: Diagnosis not present

## 2017-11-09 DIAGNOSIS — R531 Weakness: Secondary | ICD-10-CM | POA: Diagnosis not present

## 2017-11-09 DIAGNOSIS — M545 Low back pain: Secondary | ICD-10-CM | POA: Diagnosis not present

## 2017-11-15 DIAGNOSIS — Z79891 Long term (current) use of opiate analgesic: Secondary | ICD-10-CM | POA: Diagnosis not present

## 2017-11-15 DIAGNOSIS — M4722 Other spondylosis with radiculopathy, cervical region: Secondary | ICD-10-CM | POA: Diagnosis not present

## 2017-11-15 DIAGNOSIS — G894 Chronic pain syndrome: Secondary | ICD-10-CM | POA: Diagnosis not present

## 2017-11-15 DIAGNOSIS — M961 Postlaminectomy syndrome, not elsewhere classified: Secondary | ICD-10-CM | POA: Diagnosis not present

## 2017-11-18 DIAGNOSIS — T148XXA Other injury of unspecified body region, initial encounter: Secondary | ICD-10-CM | POA: Diagnosis not present

## 2017-11-18 DIAGNOSIS — B9689 Other specified bacterial agents as the cause of diseases classified elsewhere: Secondary | ICD-10-CM | POA: Diagnosis not present

## 2017-11-18 DIAGNOSIS — Z23 Encounter for immunization: Secondary | ICD-10-CM | POA: Diagnosis not present

## 2017-11-18 DIAGNOSIS — L089 Local infection of the skin and subcutaneous tissue, unspecified: Secondary | ICD-10-CM | POA: Diagnosis not present

## 2017-11-23 DIAGNOSIS — M545 Low back pain: Secondary | ICD-10-CM | POA: Diagnosis not present

## 2017-11-23 DIAGNOSIS — M546 Pain in thoracic spine: Secondary | ICD-10-CM | POA: Diagnosis not present

## 2017-11-23 DIAGNOSIS — R531 Weakness: Secondary | ICD-10-CM | POA: Diagnosis not present

## 2017-11-23 DIAGNOSIS — M5136 Other intervertebral disc degeneration, lumbar region: Secondary | ICD-10-CM | POA: Diagnosis not present

## 2017-11-28 DIAGNOSIS — M5136 Other intervertebral disc degeneration, lumbar region: Secondary | ICD-10-CM | POA: Diagnosis not present

## 2017-11-28 DIAGNOSIS — R531 Weakness: Secondary | ICD-10-CM | POA: Diagnosis not present

## 2017-11-28 DIAGNOSIS — M546 Pain in thoracic spine: Secondary | ICD-10-CM | POA: Diagnosis not present

## 2017-11-28 DIAGNOSIS — M545 Low back pain: Secondary | ICD-10-CM | POA: Diagnosis not present

## 2017-11-29 DIAGNOSIS — E291 Testicular hypofunction: Secondary | ICD-10-CM | POA: Diagnosis not present

## 2017-12-05 DIAGNOSIS — R531 Weakness: Secondary | ICD-10-CM | POA: Diagnosis not present

## 2017-12-05 DIAGNOSIS — M545 Low back pain: Secondary | ICD-10-CM | POA: Diagnosis not present

## 2017-12-05 DIAGNOSIS — M5136 Other intervertebral disc degeneration, lumbar region: Secondary | ICD-10-CM | POA: Diagnosis not present

## 2017-12-05 DIAGNOSIS — M546 Pain in thoracic spine: Secondary | ICD-10-CM | POA: Diagnosis not present

## 2017-12-06 DIAGNOSIS — N486 Induration penis plastica: Secondary | ICD-10-CM | POA: Diagnosis not present

## 2017-12-06 DIAGNOSIS — N5201 Erectile dysfunction due to arterial insufficiency: Secondary | ICD-10-CM | POA: Diagnosis not present

## 2017-12-06 DIAGNOSIS — E291 Testicular hypofunction: Secondary | ICD-10-CM | POA: Diagnosis not present

## 2017-12-08 ENCOUNTER — Other Ambulatory Visit: Payer: Self-pay | Admitting: Family

## 2017-12-08 DIAGNOSIS — I10 Essential (primary) hypertension: Secondary | ICD-10-CM

## 2017-12-11 DIAGNOSIS — N486 Induration penis plastica: Secondary | ICD-10-CM | POA: Diagnosis not present

## 2017-12-12 DIAGNOSIS — R531 Weakness: Secondary | ICD-10-CM | POA: Diagnosis not present

## 2017-12-12 DIAGNOSIS — M546 Pain in thoracic spine: Secondary | ICD-10-CM | POA: Diagnosis not present

## 2017-12-12 DIAGNOSIS — M545 Low back pain: Secondary | ICD-10-CM | POA: Diagnosis not present

## 2017-12-12 DIAGNOSIS — M5136 Other intervertebral disc degeneration, lumbar region: Secondary | ICD-10-CM | POA: Diagnosis not present

## 2017-12-13 ENCOUNTER — Encounter: Payer: Self-pay | Admitting: Nurse Practitioner

## 2017-12-13 ENCOUNTER — Ambulatory Visit (INDEPENDENT_AMBULATORY_CARE_PROVIDER_SITE_OTHER): Payer: Medicare Other | Admitting: Nurse Practitioner

## 2017-12-13 VITALS — BP 140/82 | HR 86 | Temp 99.5°F | Resp 18 | Ht 71.0 in | Wt 186.0 lb

## 2017-12-13 DIAGNOSIS — M4722 Other spondylosis with radiculopathy, cervical region: Secondary | ICD-10-CM | POA: Diagnosis not present

## 2017-12-13 DIAGNOSIS — I10 Essential (primary) hypertension: Secondary | ICD-10-CM | POA: Diagnosis not present

## 2017-12-13 DIAGNOSIS — Z79891 Long term (current) use of opiate analgesic: Secondary | ICD-10-CM | POA: Diagnosis not present

## 2017-12-13 DIAGNOSIS — G894 Chronic pain syndrome: Secondary | ICD-10-CM | POA: Diagnosis not present

## 2017-12-13 DIAGNOSIS — G43809 Other migraine, not intractable, without status migrainosus: Secondary | ICD-10-CM

## 2017-12-13 DIAGNOSIS — M542 Cervicalgia: Secondary | ICD-10-CM

## 2017-12-13 DIAGNOSIS — M961 Postlaminectomy syndrome, not elsewhere classified: Secondary | ICD-10-CM | POA: Diagnosis not present

## 2017-12-13 MED ORDER — BUTALBITAL-APAP-CAFFEINE 50-325-40 MG PO TABS: 1.0000 | ORAL_TABLET | Freq: Four times a day (QID) | ORAL | 0 refills | Status: DC | PRN

## 2017-12-13 MED ORDER — AMLODIPINE BESYLATE 10 MG PO TABS: 10.0000 mg | ORAL_TABLET | Freq: Every day | ORAL | 2 refills | Status: DC

## 2017-12-13 NOTE — Progress Notes (Signed)
Name: Jeremiah Anderson   MRN: [id number]    DOB: [birth year]   Date:12/13/2017       Progress Note  Subjective  Chief Complaint  Chief Complaint  Patient presents with  . Establish Care    medication refill     HPI He is requesting refills of his blood pressure and migraine medications today. He is very restless which he says related to a past history of ADHD but he does not want to take any medications for this. He is following with pain management provider Dr Hardin Negus, orthopedic provider Dr Louanne Skye, and PT for chronic pain, with scheduled pain injections for neck pain next week.  Hypertension -maintained on amlodipine 10 daily. Reports daily medication compliance without adverse medication effects. Reports he has noticed higher readings lately at home with top number in the 140s but he is having increased pain in his neck which he is following with pain management provider for. Denies chest pain, shortness of breath, edema.  BP Readings from Last 3 Encounters:  12/13/17 140/82  09/20/17 (!) 146/90  09/11/17 (!) 148/90   Headaches- This is chronic issue. He has been experiencing migraines for years. The migraines occur on the right side of his head. He describes as throbbing pain behind his right eye. He used to take 3 fioricet a day every but stopped cold [place] several months because he was afraid he was having rebound headaches. He reports initially he did experience severe migraines when he stopped the Fioricet but the headaches finally resolved and he has noticed less headaches since. He does still ocassionally get the migraines around twice a month and he is able to get relief with fioricet. He tried other medications in the past with no relief He does note that his headaches have been a little more frequent over past month or so, but are in the same location -behind right eye- and feel like his typical migraines. He been dealing with increased neck pain around the time the  headaches increased and he is going to get an injection for neck pain in pain clinic next week. He denies loss of consciousness, fevers, dizziness, weakness, confusion, vision changes, nausea, vomiting.   Patient Active Problem List   Diagnosis Date Noted  . Closed physeal fracture of proximal phalanx of lesser toe of left foot with routine healing 07/04/2017  . Medicare annual wellness visit, subsequent 01/27/2017  . S/P hernia repair 10/21/2016  . Painful penile erection 10/06/2016  . Ingrown toenail 09/26/2016  . Flank pain 07/12/2016  . Right-sided chest wall pain 07/12/2016  . Abscess of nasal cavity 06/14/2016  . Right flank mass 06/14/2016  . Tobacco use disorder 06/14/2016  . Overweight (BMI 25.0-29.9) 06/14/2016  . Skin exam, screening for cancer 04/04/2016  . Sacral contusion 12/18/2015  . Sinusitis, chronic 08/12/2015  . Arthralgia 07/06/2015  . Pill-induced gastritis 06/10/2015  . Chronic back pain 06/10/2015  . Multiple duodenal ulcers 06/10/2015  . Nausea & vomiting 05/27/2015  . Rash 05/07/2015  . Hyperlipidemia 05/07/2015  . Migraines 04/21/2015  . 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 Mood change 03/19/2015  . Rib pain on right side 02/16/2015  . Essential hypertension 02/16/2015  . Low back pain 02/16/2015    Past Surgical History:  Procedure Laterality Date  . APPENDECTOMY  1980s   post op bleeding sent him back for ex lap and hemostatic intervention.   Marland Kitchen BACK SURGERY     8 surgeries total - fused from L1-S1  . COLONOSCOPY  Age 52. For bleeding  . ESOPHAGOGASTRODUODENOSCOPY N/A 06/10/2015   Procedure: ESOPHAGOGASTRODUODENOSCOPY (EGD);  Surgeon: Lafayette Dragon, MD;  Location[facility] ENDOSCOPY;  Service: Endoscopy;  Laterality: N/A;  . ex lap with peri gastric vessel ligation  1990s   after retching, he rupture a blood vessel "outside" the stomach.  vessel was ligated per pt's descriiption.   Fatima Blank HERNIA REPAIR Right 10/21/2016   Procedure:  OPEN FLANK HERNIA REPAIR WITH MESH;  Surgeon: Ralene Ok, MD;  Location: [place];  Service: General;  Laterality: Right;  . INSERTION OF MESH Right 10/21/2016   Procedure: INSERTION OF MESH;  Surgeon: Ralene Ok, MD;  Location: [place];  Service: General;  Laterality: Right;  . KNEE SURGERY     Multiple bilateral scopes  . RETINAL DETACHMENT SURGERY Right   . SHOULDER SURGERY     4x on the left - total shoulder replacement.   Marland Kitchen SHOULDER SURGERY Left    x2  . TOTAL SHOULDER ARTHROPLASTY Left    x2    Family History  Problem Relation Age of Onset  . Arthritis Mother   . Hyperlipidemia Mother   . Hypertension Father   . Colon cancer Maternal Grandmother     Social History   Socioeconomic History  . Marital status: Unknown    Spouse name: Not on file  . Number of children: 0  . Years of education: 90  . Highest education level: Not on file  Social Needs  . Financial resource strain: Not on file  . Food insecurity - worry: Not on file  . Food insecurity - inability: Not on file  . Transportation needs - medical: Not on file  . Transportation needs - non-medical: Not on file  Occupational History  . Occupation: Disability    Comment: Former Therapist, sports  Tobacco Use  . Smoking status: Current Every Day Smoker    Years: 24.00    Types: Cigarettes  . Smokeless tobacco: Never Used  Substance and Sexual Activity  . Alcohol use: Yes    Alcohol/week: 0.0 oz    Comment: rarely  . Drug use: No  . Sexual activity: Not on file  Other Topics Concern  . Not on file  Social History Narrative   Fun: play guitar / limited funds      Denies any religious beliefs effecting health care.      Current Outpatient Medications:  .  amLODipine (NORVASC) 10 MG tablet, TAKE 1 TABLET BY MOUTH ONCE DAILY, Disp: 90 tablet, Rfl: 1 .  butalbital-acetaminophen-caffeine (FIORICET, ESGIC) 50-325-40 MG tablet, Take 1 tablet by mouth 3 (three) times daily as needed., Disp: 90 tablet, Rfl: 0 .   Coenzyme Q10 (COQ10) 100 MG CAPS, Take 100 mg by mouth daily., Disp: , Rfl:  .  fluticasone (FLONASE) 50 MCG/ACT nasal spray, Place 2 sprays into both nostrils daily. Must make appt w/new provider for future refills, Disp: 48 g, Rfl: 0 .  Lidocaine 4 % PTCH, Place 1-2 patches onto the skin 2 (two) times daily as needed (for pain.). Salonpas Maximum Strength Pain Relieving Gel-Patch, Disp: , Rfl:  .  morphine (MSIR) 30 MG tablet, Take 30 mg by mouth every 3 (three) hours. 8 TIMES A DAY, Disp: , Rfl:  .  Multiple Vitamin (MULTIVITAMIN WITH MINERALS) TABS tablet, Take 1 tablet by mouth daily., Disp: , Rfl:  .  Omega-3 Fatty Acids (OMEGA 3 PO), Take 120 mg by mouth daily., Disp: , Rfl:  .  ondansetron (ZOFRAN) 4 MG tablet,  TAKE 1 TABLET BY MOUTH EVERY 8 HOURS AS NEEDED FOR NAUSEA OR  VOMITING, Disp: 20 tablet, Rfl: 0 .  Red Yeast Rice Extract (RED YEAST RICE PO), Take 1 capsule by mouth daily., Disp: , Rfl:  .  testosterone cypionate (DEPOTESTOSTERONE CYPIONATE) 200 MG/ML injection, , Disp: , Rfl:   Current Facility-Administered Medications:  .  0.9 %  sodium chloride infusion, 500 mL, Intravenous, Continuous, Danis, Kirke Corin, MD  Allergies  Allergen Reactions  . Fentanyl Other (See Comments)    Hallucinations (with the patch)   . Lisinopril Swelling  . Lyrica [Pregabalin]     hallucinations  . Sulfa Antibiotics Swelling     ROS See HPI  Objective  Vitals:   12/13/17 1521  BP: 140/82  Pulse: 86  Resp: 18  Temp: 99.5 F (37.5 C)  TempSrc: Oral  SpO2: 98%  Weight: 186 lb (84.4 kg)  Height: 5\' 11"  (1.803 m)   Body mass index is 25.94 kg/m.  Physical Exam Constitutional: Patient appears well-developed and well-nourished. No distress.  HENT: Head: Normocephalic and atraumatic. Ears: Bilateral TMs without erythema or effusion; Nose: Nose normal. Mouth/Throat: Oropharynx is clear and moist. No oropharyngeal exudate.  Eyes: Conjunctivae and EOM are normal. Pupils are equal,  round, and reactive to light. No scleral icterus.  Neck: Normal range of motion. Neck supple. Cardiovascular: Normal rate, regular rhythm and normal heart sounds. No BLE edema. Distal pulses intact. Pulmonary/Chest: Effort normal and breath sounds normal. No respiratory distress. Musculoskeletal: Normal range of motion, no joint effusions. No gross deformities Neurological: He is alert and oriented to person, place, and time. No cranial nerve deficit. Coordination, balance, strength, speech and gait are normal.  Skin: Skin is warm and dry. No rash noted. No erythema.  Psychiatric: Patient has a normal mood. Judgment and thought content normal. He is restless, he can not sit still during examination.  PHQ2/9: Depression screen PHQ 2/9 12/13/2017  Decreased Interest 0  Down, Depressed, Hopeless 0  PHQ - 2 Score 0   Fall Risk: Fall Risk  12/13/2017  Falls in the past year? Yes  Number falls in past yr: 1  Injury with Fall? No    Assessment & Plan RTC in 2 weeks for BP recheck and to check status of migraines

## 2017-12-13 NOTE — Patient Instructions (Addendum)
Please try to check your blood pressure once daily or at least a few times a week, at the same time each day, and keep a log. Return in about 2-3 weeks and bring your blood pressure cuff. We will look at your readings, make sure your blood pressure cuff Is accurate and decide if we need to make any changes to your medications.  It was nice to meet you. Thanks for letting me take care of you today :)   Mindfulness-Based Stress Reduction Mindfulness-based stress reduction (MBSR) is a program that helps people learn to practice mindfulness. Mindfulness is the practice of intentionally paying attention to the present moment. It can be learned and practiced through techniques such as education, breathing exercises, meditation, and yoga. MBSR includes several mindfulness techniques in one program. MBSR works best when you understand the treatment, are willing to try new things, and can commit to spending time practicing what you learn. MBSR training may include learning about:  How your emotions, thoughts, and reactions affect your body.  New ways to respond to things that cause negative thoughts to start (triggers).  How to notice your thoughts and let go of them.  Practicing awareness of everyday things that you normally do without thinking.  The techniques and goals of different types of meditation.  What are the benefits of MBSR? MBSR can have many benefits, which include helping you to:  Develop self-awareness. This refers to knowing and understanding yourself.  Learn skills and attitudes that help you to participate in your own health care.  Learn new ways to care for yourself.  Be more accepting about how things are, and let things go.  Be less judgmental and approach things with an open mind.  Be patient with yourself and trust yourself more.  MBSR has also been shown to:  Reduce negative emotions, such as depression and anxiety.  Improve memory and focus.  Change how you  sense and approach pain.  Boost your body's ability to fight infections.  Help you connect better with other people.  Improve your sense of well-being.  Follow these instructions at home:  Find a local in-person or online MBSR program.  Set aside some time regularly for mindfulness practice.  Find a mindfulness practice that works best for you. This may include one or more of the following: ? Meditation. Meditation involves focusing your mind on a certain thought or activity. ? Breathing awareness exercises. These help you to stay present by focusing on your breath. ? Body scan. For this practice, you lie down and pay attention to each part of your body from head to toe. You can identify tension and soreness and intentionally relax parts of your body. ? Yoga. Yoga involves stretching and breathing, and it can improve your ability to move and be flexible. It can also provide an experience of testing your body's limits, which can help you release stress. ? Mindful eating. This way of eating involves focusing on the taste, texture, color, and smell of each bite of food. Because this slows down eating and helps you feel full sooner, it can be an important part of a weight-loss plan.  Find a podcast or recording that provides guidance for breathing awareness, body scan, or meditation exercises. You can listen to these any time when you have a free moment to rest without distractions.  Follow your treatment plan as told by your health care provider. This may include taking regular medicines and making changes to your diet or lifestyle  as recommended. How to practice mindfulness To do a basic awareness exercise:  Find a comfortable place to sit.  Pay attention to the present moment. Observe your thoughts, feelings, and surroundings just as they are.  Avoid placing judgment on yourself, your feelings, or your surroundings. Make note of any judgment that comes up, and let it go.  Your mind  may wander, and that is okay. Make note of when your thoughts drift, and return your attention to the present moment.  To do basic mindfulness meditation:  Find a comfortable place to sit. This may include a stable chair or a firm floor cushion. ? Sit upright with your back straight. Let your arms fall next to your side with your hands resting on your legs. ? If sitting in a chair, rest your feet flat on the floor. ? If sitting on a cushion, cross your legs in front of you.  Keep your head in a neutral position with your chin dropped slightly. Relax your jaw and rest the tip of your tongue on the roof of your mouth. Drop your gaze to the floor. You can close your eyes if you like.  Breathe normally and pay attention to your breath. Feel the air moving in and out of your nose. Feel your belly expanding and relaxing with each breath.  Your mind may wander, and that is okay. Make note of when your thoughts drift, and return your attention to your breath.  Avoid placing judgment on yourself, your feelings, or your surroundings. Make note of any judgment or feelings that come up, let them go, and bring your attention back to your breath.  When you are ready, lift your gaze or open your eyes. Pay attention to how your body feels after the meditation.  Where to find more information: You can find more information about MBSR from:  Your health care provider.  Community-based meditation centers or programs.  Programs offered near you.  Summary  Mindfulness-based stress reduction (MBSR) is a program that teaches you how to intentionally pay attention to the present moment. It is used with other treatments to help you cope better with daily stress, emotions, and pain.  MBSR focuses on developing self-awareness, which allows you to respond to life stress without judgment or negative emotions.  MBSR programs may involve learning different mindfulness practices, such as breathing exercises,  meditation, yoga, body scan, or mindful eating. Find a mindfulness practice that works best for you, and set aside time for it on a regular basis. This information is not intended to replace advice given to you by your health care provider. Make sure you discuss any questions you have with your health care provider. Document Released: 03/16/2017 Document Revised: 03/16/2017 Document Reviewed: 03/16/2017 Elsevier Interactive Patient Education  [employer].

## 2017-12-17 ENCOUNTER — Encounter: Payer: Self-pay | Admitting: Nurse Practitioner

## 2017-12-17 DIAGNOSIS — M542 Cervicalgia: Secondary | ICD-10-CM | POA: Insufficient documentation

## 2017-12-17 NOTE — Assessment & Plan Note (Signed)
His headaches are consistent with migraines and have not changed in character but I am concerned about the increased frequency over the past month. He does feel like his neck pain is contributing and he is planning to get pain injection for neck pain next week. He will return for follow up in 2 weeks and we will see if his headaches have improved after neck pain injections- may need to consider imaging and/or referral back to neurology if he is still experiencing more frequent migraines.  -he was referred to neurology in 2016 for management of his headaches but declined that referral- says he would reconsider going to neurology if recommended in the future We discussed red flags for follow up including loss of consciousness, intractable headache, fevers over 101.4, loss of vision, confusion, nausea or vomiting. - butalbital-acetaminophen-caffeine (FIORICET, ESGIC) 50-325-40 MG tablet; Take 1 tablet by mouth every 6 (six) hours as needed for headache.  Dispense: 20 tablet; Refill: 0

## 2017-12-17 NOTE — Assessment & Plan Note (Signed)
Recent increase in BP readings at home. He feels this may be related to his neck pain - amLODipine (NORVASC) 10 MG tablet; Take 1 tablet (10 mg total) by mouth daily.  Dispense: 90 tablet; Refill: 2 - Basic metabolic panel; Future He will RTC in about 2 weeks after he gets pain injection in pain clinic and bring BP logs/cuff- we will see what his blood pressure readings have been like and if they are improved after pain injections.

## 2017-12-17 NOTE — Assessment & Plan Note (Signed)
He will continue follow up with pain management We also discussed mindfulness based stress reduction in the management of his neck pain and blood pressure- See AVS for education provided to patient

## 2017-12-19 DIAGNOSIS — M4722 Other spondylosis with radiculopathy, cervical region: Secondary | ICD-10-CM | POA: Diagnosis not present

## 2017-12-19 DIAGNOSIS — M5412 Radiculopathy, cervical region: Secondary | ICD-10-CM | POA: Diagnosis not present

## 2017-12-25 DIAGNOSIS — D229 Melanocytic nevi, unspecified: Secondary | ICD-10-CM | POA: Diagnosis not present

## 2017-12-25 DIAGNOSIS — L853 Xerosis cutis: Secondary | ICD-10-CM | POA: Diagnosis not present

## 2017-12-25 DIAGNOSIS — L700 Acne vulgaris: Secondary | ICD-10-CM | POA: Diagnosis not present

## 2017-12-25 DIAGNOSIS — L814 Other melanin hyperpigmentation: Secondary | ICD-10-CM | POA: Diagnosis not present

## 2017-12-25 DIAGNOSIS — L821 Other seborrheic keratosis: Secondary | ICD-10-CM | POA: Diagnosis not present

## 2017-12-26 DIAGNOSIS — R531 Weakness: Secondary | ICD-10-CM | POA: Diagnosis not present

## 2017-12-26 DIAGNOSIS — M545 Low back pain: Secondary | ICD-10-CM | POA: Diagnosis not present

## 2017-12-26 DIAGNOSIS — M5136 Other intervertebral disc degeneration, lumbar region: Secondary | ICD-10-CM | POA: Diagnosis not present

## 2017-12-26 DIAGNOSIS — M546 Pain in thoracic spine: Secondary | ICD-10-CM | POA: Diagnosis not present

## 2018-01-03 DIAGNOSIS — M545 Low back pain: Secondary | ICD-10-CM | POA: Diagnosis not present

## 2018-01-03 DIAGNOSIS — M546 Pain in thoracic spine: Secondary | ICD-10-CM | POA: Diagnosis not present

## 2018-01-03 DIAGNOSIS — M5136 Other intervertebral disc degeneration, lumbar region: Secondary | ICD-10-CM | POA: Diagnosis not present

## 2018-01-03 DIAGNOSIS — R531 Weakness: Secondary | ICD-10-CM | POA: Diagnosis not present

## 2018-01-08 DIAGNOSIS — M5136 Other intervertebral disc degeneration, lumbar region: Secondary | ICD-10-CM | POA: Diagnosis not present

## 2018-01-08 DIAGNOSIS — M545 Low back pain: Secondary | ICD-10-CM | POA: Diagnosis not present

## 2018-01-08 DIAGNOSIS — R531 Weakness: Secondary | ICD-10-CM | POA: Diagnosis not present

## 2018-01-08 DIAGNOSIS — M546 Pain in thoracic spine: Secondary | ICD-10-CM | POA: Diagnosis not present

## 2018-01-09 DIAGNOSIS — M4722 Other spondylosis with radiculopathy, cervical region: Secondary | ICD-10-CM | POA: Diagnosis not present

## 2018-01-09 DIAGNOSIS — G894 Chronic pain syndrome: Secondary | ICD-10-CM | POA: Diagnosis not present

## 2018-01-09 DIAGNOSIS — Z79891 Long term (current) use of opiate analgesic: Secondary | ICD-10-CM | POA: Diagnosis not present

## 2018-01-09 DIAGNOSIS — M961 Postlaminectomy syndrome, not elsewhere classified: Secondary | ICD-10-CM | POA: Diagnosis not present

## 2018-01-10 ENCOUNTER — Telehealth: Payer: Self-pay | Admitting: Nurse Practitioner

## 2018-01-10 DIAGNOSIS — G43809 Other migraine, not intractable, without status migrainosus: Secondary | ICD-10-CM

## 2018-01-10 NOTE — Telephone Encounter (Signed)
Copied from [id number] [phone]. Topic: Quick Communication - Rx Refill/Question >> Jan 10, 2018  2:04 PM Lolita Rieger, [place] wrote: Medication: Fioricet 50-325-40   Has the patient contacted their pharmacy? yes   (Agent: If no, request that the patient contact the pharmacy for the refill.)   Preferred Pharmacy (with phone number or street name): [facility]: Please be advised that RX refills may take up to 3 business days. We ask that you follow-up with your pharmacy.

## 2018-01-11 ENCOUNTER — Ambulatory Visit: Payer: Self-pay | Admitting: Nurse Practitioner

## 2018-01-12 MED ORDER — BUTALBITAL-APAP-CAFFEINE 50-325-40 MG PO TABS: 1.0000 | ORAL_TABLET | Freq: Four times a day (QID) | ORAL | 0 refills | Status: DC | PRN

## 2018-01-12 NOTE — Telephone Encounter (Signed)
Refill sent. Can we ask him how his headaches and blood pressures are doing? He was supposed to return for follow up with me this month, I have a feeling he was rescheduled because I was out. Thank you.

## 2018-01-15 ENCOUNTER — Ambulatory Visit (INDEPENDENT_AMBULATORY_CARE_PROVIDER_SITE_OTHER): Payer: Medicare Other | Admitting: Specialist

## 2018-01-15 ENCOUNTER — Encounter (INDEPENDENT_AMBULATORY_CARE_PROVIDER_SITE_OTHER): Payer: Self-pay | Admitting: Specialist

## 2018-01-15 DIAGNOSIS — Z981 Arthrodesis status: Secondary | ICD-10-CM

## 2018-01-15 DIAGNOSIS — M4722 Other spondylosis with radiculopathy, cervical region: Secondary | ICD-10-CM | POA: Diagnosis not present

## 2018-01-15 NOTE — Telephone Encounter (Signed)
Called pt no answer LMOM w/Ashleigh response. (sent CRM to [facility] for FYI_.Marland KitchenJohny Chess

## 2018-01-15 NOTE — Patient Instructions (Signed)
Avoid overhead lifting and overhead use of the arms. Do not lift greater than 5 lbs. Adjust head rest in vehicle to prevent hyperextension if rear ended. Take extra precautions to avoid falling.  

## 2018-01-15 NOTE — Progress Notes (Signed)
Office Visit Note   Patient: Jeremiah Anderson           Date of Birth: 05-Mar-1966           MRN: [id number] Visit Date: 01/15/2018              Requested by: Lance Sell, NP [place],  [id number] PCP: Lance Sell, NP   Assessment & Plan: Visit Diagnoses:  1. Other spondylosis with radiculopathy, cervical region   2. Status post lumbar spinal fusion     Plan: Avoid overhead lifting and overhead use of the arms. Do not lift greater than 5 lbs. Adjust head rest in vehicle to prevent hyperextension if rear ended. Take extra precautions to avoid falling.  Follow-Up Instructions: Return in about 3 months (around 04/14/2018).   Orders:  No orders of the defined types were placed in this encounter.  No orders of the defined types were placed in this encounter.     Procedures: No procedures performed   Clinical Data: No additional findings.   Subjective: No chief complaint on file.   52 year old male with right cervicalgia with pain into the right shoulder and pain with use of the the right arm. He is going to work out 3 times aweek to try and improve his core Muscles. 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 He is walking fine, no gait disturbance, but a chronic right drop foot does have his conderned, numbness between His right greate and second toes.     Review of Systems  Constitutional: Negative.   HENT: Negative.   Eyes: Negative.   Respiratory: Negative.   Cardiovascular: Negative.   Gastrointestinal: Negative.   Endocrine: Negative.   Genitourinary: Negative.   Musculoskeletal: Negative.   Skin: Negative.   Allergic/Immunologic: Negative.   Neurological: Negative.   Hematological: Negative.   Psychiatric/Behavioral: Negative.      Objective: Vital Signs: There were no vitals taken for this visit.  Physical Exam  Constitutional: He is oriented to person, place, and time. He  appears well-developed and well-nourished.  HENT:  Head: Normocephalic and atraumatic.  Eyes: EOM are normal. Pupils are equal, round, and reactive to light.  Neck: Normal range of motion. Neck supple.  Pulmonary/Chest: Effort normal and breath sounds normal.  Abdominal: Soft. Bowel sounds are normal.  Neurological: He is alert and oriented to person, place, and time.  Skin: Skin is warm and dry.  Psychiatric: He has a normal mood and affect. His behavior is normal. Judgment and thought content normal.    Back Exam   Tenderness  The patient is experiencing tenderness in the lumbar.  Range of Motion  Extension: abnormal  Flexion: normal  Lateral bend right: abnormal  Lateral bend left: normal  Rotation right: abnormal  Rotation left: normal   Muscle Strength  Right Quadriceps:  5/5  Left Quadriceps:  5/5  Right Hamstrings:  5/5  Left Hamstrings:  5/5   Tests  Straight leg raise right: negative Straight leg raise left: negative  Reflexes  Patellar: normal Achilles: normal Biceps: normal Babinski's sign: normal   Other  Toe walk: normal Heel walk: normal Sensation: normal Gait: normal       Specialty Comments:  No specialty comments available.  Imaging: No results found.   PMFS History: Patient Active Problem List   Diagnosis Date Noted  . Neck pain 12/17/2017  . Medicare annual wellness  visit, subsequent 01/27/2017  . Abscess of nasal cavity 06/14/2016  . Right flank mass 06/14/2016  . Tobacco use disorder 06/14/2016  . Overweight (BMI 25.0-29.9) 06/14/2016  . Sinusitis, chronic 08/12/2015  . Arthralgia 07/06/2015  . Pill-induced gastritis 06/10/2015  . Chronic back pain 06/10/2015  . Multiple duodenal ulcers 06/10/2015  . Nausea & vomiting 05/27/2015  . Hyperlipidemia 05/07/2015  . Migraines 04/21/2015  . Mood change 03/19/2015  . Essential hypertension 02/16/2015  . Low back pain 02/16/2015   Past Medical History:  Diagnosis Date  .  Arthritis   . GERD (gastroesophageal reflux disease)   . Hyperlipidemia   . Hypertension   . Migraines   . Wears glasses     Family History  Problem Relation Age of Onset  . Arthritis Mother   . Hyperlipidemia Mother   . Hypertension Father   . Colon cancer Maternal Grandmother     Past Surgical History:  Procedure Laterality Date  . APPENDECTOMY  1980s   post op bleeding sent him back for ex lap and hemostatic intervention.   Marland Kitchen BACK SURGERY     8 surgeries total - fused from L1-S1  . COLONOSCOPY     Age 16. For bleeding  . ESOPHAGOGASTRODUODENOSCOPY N/A 06/10/2015   Procedure: ESOPHAGOGASTRODUODENOSCOPY (EGD);  Surgeon: Lafayette Dragon, MD;  Location: [facility] ENDOSCOPY;  Service: Endoscopy;  Laterality: N/A;  . ex lap with peri gastric vessel ligation  1990s   after retching, he rupture a blood vessel "outside" the stomach.  vessel was ligated per pt's descriiption.   Fatima Blank HERNIA REPAIR Right 10/21/2016   Procedure: OPEN FLANK HERNIA REPAIR WITH MESH;  Surgeon: Ralene Ok, MD;  Location: [place];  Service: General;  Laterality: Right;  . INSERTION OF MESH Right 10/21/2016   Procedure: INSERTION OF MESH;  Surgeon: Ralene Ok, MD;  Location: [place];  Service: General;  Laterality: Right;  . KNEE SURGERY     Multiple bilateral scopes  . RETINAL DETACHMENT SURGERY Right   . SHOULDER SURGERY     4x on the left - total shoulder replacement.   Marland Kitchen SHOULDER SURGERY Left    x2  . TOTAL SHOULDER ARTHROPLASTY Left    x2   Social History   Occupational History  . Occupation: Disability    Comment: Former Therapist, sports  Tobacco Use  . Smoking status: Current Every Day Smoker    Years: 24.00    Types: Cigarettes  . Smokeless tobacco: Never Used  Substance and Sexual Activity  . Alcohol use: Yes    Alcohol/week: 0.0 oz    Comment: rarely  . Drug use: No  . Sexual activity: Not on file

## 2018-01-16 DIAGNOSIS — M546 Pain in thoracic spine: Secondary | ICD-10-CM | POA: Diagnosis not present

## 2018-01-16 DIAGNOSIS — M5136 Other intervertebral disc degeneration, lumbar region: Secondary | ICD-10-CM | POA: Diagnosis not present

## 2018-01-16 DIAGNOSIS — R531 Weakness: Secondary | ICD-10-CM | POA: Diagnosis not present

## 2018-01-16 DIAGNOSIS — M545 Low back pain: Secondary | ICD-10-CM | POA: Diagnosis not present

## 2018-01-16 NOTE — Telephone Encounter (Signed)
Tried calling pt again still no answer. Pt has not called back w/updated on BP/headaches. Per chart he has made f/u appt scheduled for April 18th.Jeremiah KitchenJohny Chess

## 2018-01-23 DIAGNOSIS — M545 Low back pain: Secondary | ICD-10-CM | POA: Diagnosis not present

## 2018-01-23 DIAGNOSIS — M546 Pain in thoracic spine: Secondary | ICD-10-CM | POA: Diagnosis not present

## 2018-01-23 DIAGNOSIS — M5136 Other intervertebral disc degeneration, lumbar region: Secondary | ICD-10-CM | POA: Diagnosis not present

## 2018-01-23 DIAGNOSIS — R531 Weakness: Secondary | ICD-10-CM | POA: Diagnosis not present

## 2018-01-25 DIAGNOSIS — M545 Low back pain: Secondary | ICD-10-CM | POA: Diagnosis not present

## 2018-01-25 DIAGNOSIS — R531 Weakness: Secondary | ICD-10-CM | POA: Diagnosis not present

## 2018-01-25 DIAGNOSIS — M546 Pain in thoracic spine: Secondary | ICD-10-CM | POA: Diagnosis not present

## 2018-01-25 DIAGNOSIS — M5136 Other intervertebral disc degeneration, lumbar region: Secondary | ICD-10-CM | POA: Diagnosis not present

## 2018-01-27 ENCOUNTER — Other Ambulatory Visit: Payer: Self-pay | Admitting: Family Medicine

## 2018-01-27 DIAGNOSIS — J321 Chronic frontal sinusitis: Secondary | ICD-10-CM

## 2018-01-30 ENCOUNTER — Other Ambulatory Visit: Payer: Self-pay | Admitting: Nurse Practitioner

## 2018-01-30 DIAGNOSIS — G43809 Other migraine, not intractable, without status migrainosus: Secondary | ICD-10-CM

## 2018-01-30 DIAGNOSIS — M545 Low back pain: Secondary | ICD-10-CM | POA: Diagnosis not present

## 2018-01-30 DIAGNOSIS — R531 Weakness: Secondary | ICD-10-CM | POA: Diagnosis not present

## 2018-01-30 DIAGNOSIS — M546 Pain in thoracic spine: Secondary | ICD-10-CM | POA: Diagnosis not present

## 2018-01-30 DIAGNOSIS — M5136 Other intervertebral disc degeneration, lumbar region: Secondary | ICD-10-CM | POA: Diagnosis not present

## 2018-01-30 NOTE — Telephone Encounter (Signed)
I would like for him to come back in so we can discuss his headaches, he can not keep taking Fioricet routinely. We need to consider more testing or referral to neurology. He was asked to return in 2 weeks from January office visit

## 2018-01-30 NOTE — Telephone Encounter (Signed)
Patient said that he had an appt for 2/21 and the provider cancelled it and couldn't get back in until April. Patient said he only takes them as needed, mostly like one a day. He said his BP has been running good.

## 2018-01-31 MED ORDER — BUTALBITAL-APAP-CAFFEINE 50-325-40 MG PO TABS: 1.0000 | ORAL_TABLET | Freq: Four times a day (QID) | ORAL | 0 refills | Status: DC | PRN

## 2018-01-31 NOTE — Telephone Encounter (Signed)
The fioricet was to be used as needed only, not daily. I do not want him to become dependent on it. I can not increase the number of fioricet. I can place a referral back to neurology for him while he is waiting to come back for his follow up, or he can follow up with his orthopedist if he thinks this is coming from his neck pain.  The goal is to get his headaches under better control so that he no longer needs the fioricet.

## 2018-01-31 NOTE — Telephone Encounter (Addendum)
Patient calling back, needing refill more than 20 tabs.States she normally gets 43 Appt with Hollie Beach was rescheduled due to provider being out, next available is not till 03/08/18.Headaches are not getting any better due to neck pain. Call back # [phone]

## 2018-01-31 NOTE — Telephone Encounter (Signed)
Sent 1 refill until his next appointment

## 2018-02-01 DIAGNOSIS — M5136 Other intervertebral disc degeneration, lumbar region: Secondary | ICD-10-CM | POA: Diagnosis not present

## 2018-02-01 DIAGNOSIS — R531 Weakness: Secondary | ICD-10-CM | POA: Diagnosis not present

## 2018-02-01 DIAGNOSIS — M546 Pain in thoracic spine: Secondary | ICD-10-CM | POA: Diagnosis not present

## 2018-02-01 DIAGNOSIS — M545 Low back pain: Secondary | ICD-10-CM | POA: Diagnosis not present

## 2018-02-01 NOTE — Telephone Encounter (Signed)
Called pt no answer left detail msg on pt personal vm w/Ashleigh response.Marland KitchenJohny Chess

## 2018-02-06 DIAGNOSIS — M961 Postlaminectomy syndrome, not elsewhere classified: Secondary | ICD-10-CM | POA: Diagnosis not present

## 2018-02-06 DIAGNOSIS — G894 Chronic pain syndrome: Secondary | ICD-10-CM | POA: Diagnosis not present

## 2018-02-06 DIAGNOSIS — Z79891 Long term (current) use of opiate analgesic: Secondary | ICD-10-CM | POA: Diagnosis not present

## 2018-02-06 DIAGNOSIS — M4722 Other spondylosis with radiculopathy, cervical region: Secondary | ICD-10-CM | POA: Diagnosis not present

## 2018-02-09 DIAGNOSIS — F411 Generalized anxiety disorder: Secondary | ICD-10-CM | POA: Diagnosis not present

## 2018-02-09 DIAGNOSIS — G44201 Tension-type headache, unspecified, intractable: Secondary | ICD-10-CM | POA: Diagnosis not present

## 2018-02-15 DIAGNOSIS — Z79891 Long term (current) use of opiate analgesic: Secondary | ICD-10-CM | POA: Diagnosis not present

## 2018-02-15 DIAGNOSIS — R531 Weakness: Secondary | ICD-10-CM | POA: Diagnosis not present

## 2018-02-15 DIAGNOSIS — G894 Chronic pain syndrome: Secondary | ICD-10-CM | POA: Diagnosis not present

## 2018-02-15 DIAGNOSIS — M546 Pain in thoracic spine: Secondary | ICD-10-CM | POA: Diagnosis not present

## 2018-02-15 DIAGNOSIS — M4722 Other spondylosis with radiculopathy, cervical region: Secondary | ICD-10-CM | POA: Diagnosis not present

## 2018-02-15 DIAGNOSIS — M961 Postlaminectomy syndrome, not elsewhere classified: Secondary | ICD-10-CM | POA: Diagnosis not present

## 2018-02-15 DIAGNOSIS — M5136 Other intervertebral disc degeneration, lumbar region: Secondary | ICD-10-CM | POA: Diagnosis not present

## 2018-02-15 DIAGNOSIS — M545 Low back pain: Secondary | ICD-10-CM | POA: Diagnosis not present

## 2018-02-22 DIAGNOSIS — R531 Weakness: Secondary | ICD-10-CM | POA: Diagnosis not present

## 2018-02-22 DIAGNOSIS — M545 Low back pain: Secondary | ICD-10-CM | POA: Diagnosis not present

## 2018-02-22 DIAGNOSIS — M546 Pain in thoracic spine: Secondary | ICD-10-CM | POA: Diagnosis not present

## 2018-02-22 DIAGNOSIS — M5136 Other intervertebral disc degeneration, lumbar region: Secondary | ICD-10-CM | POA: Diagnosis not present

## 2018-03-01 DIAGNOSIS — M5136 Other intervertebral disc degeneration, lumbar region: Secondary | ICD-10-CM | POA: Diagnosis not present

## 2018-03-01 DIAGNOSIS — M546 Pain in thoracic spine: Secondary | ICD-10-CM | POA: Diagnosis not present

## 2018-03-01 DIAGNOSIS — R531 Weakness: Secondary | ICD-10-CM | POA: Diagnosis not present

## 2018-03-01 DIAGNOSIS — M545 Low back pain: Secondary | ICD-10-CM | POA: Diagnosis not present

## 2018-03-08 ENCOUNTER — Encounter: Payer: Self-pay | Admitting: Nurse Practitioner

## 2018-03-08 ENCOUNTER — Ambulatory Visit (INDEPENDENT_AMBULATORY_CARE_PROVIDER_SITE_OTHER): Payer: Medicare Other | Admitting: Nurse Practitioner

## 2018-03-08 VITALS — BP 124/68 | HR 112 | Temp 99.3°F | Resp 16 | Ht 71.0 in | Wt 180.0 lb

## 2018-03-08 DIAGNOSIS — M546 Pain in thoracic spine: Secondary | ICD-10-CM | POA: Diagnosis not present

## 2018-03-08 DIAGNOSIS — R531 Weakness: Secondary | ICD-10-CM | POA: Diagnosis not present

## 2018-03-08 DIAGNOSIS — G43809 Other migraine, not intractable, without status migrainosus: Secondary | ICD-10-CM

## 2018-03-08 DIAGNOSIS — I10 Essential (primary) hypertension: Secondary | ICD-10-CM | POA: Diagnosis not present

## 2018-03-08 DIAGNOSIS — M545 Low back pain: Secondary | ICD-10-CM | POA: Diagnosis not present

## 2018-03-08 DIAGNOSIS — M5136 Other intervertebral disc degeneration, lumbar region: Secondary | ICD-10-CM | POA: Diagnosis not present

## 2018-03-08 NOTE — Patient Instructions (Addendum)
Please try to check your blood pressure once daily or at least a few times a week, at the same time each day, and keep a log. Please follow up if you are getting readings over 140/90.  I have placed a referral to neurology. Our office will call you to schedule this appointment. You should hear from our office in 7-10 days.  Please return in about 3 months for your annual physical, we can recheck your blood pressure and see how your neurology visit went.

## 2018-03-08 NOTE — Progress Notes (Signed)
Name: Jeremiah Anderson   MRN: [id number]    DOB: 07-05-1966   Date:03/08/2018       Progress Note  Subjective  Chief Complaint  Chief Complaint  Patient presents with  . Follow-up    blood pressure    HPI  Hypertension -maintained on amlodipine 10 At last OV BP readings were elevated but he felt it may be due to neck pain and did not want to make any additional changes We discussed continuing to monitor BP readings at home and returning for follow up after upcoming neck pain injections- he was unable to return until today Reports daily medication compliance without adverse medication effects. Reports routinely checking bP at home with readings normally 130s/80s  Denies  vision changes, chest pain, shortness of breath, edema.  BP Readings from Last 3 Encounters:  03/08/18 124/68  12/13/17 140/82  09/20/17 (!) 146/90    Headaches-  Continues to have migraines- right sided throbbing pain, but the frequency has decreased. He now has the migraines about 2-3 times a week He does continue to have tension and pain in his neck and is currently working with PT and receiving trigger point injections with ortho provider for his neck pain The migraines are notably worse when his neck pain is worse He is able to get some relief of the headaches with tylenol migraine He has been taking PRN fioricet for his migraines with full relief  He reports photophobia with the migraines He denies loss of consciousness, fevers, dizziness, weakness, confusion, vision changes, nausea, vomiting.   Patient Active Problem List   Diagnosis Date Noted  . Neck pain 12/17/2017  . Medicare annual wellness visit, subsequent 01/27/2017  . Abscess of nasal cavity 06/14/2016  . Right flank mass 06/14/2016  . Tobacco use disorder 06/14/2016  . Overweight (BMI 25.0-29.9) 06/14/2016  . Sinusitis, chronic 08/12/2015  . Arthralgia 07/06/2015  . Pill-induced gastritis 06/10/2015  . Chronic back pain 06/10/2015  .  Multiple duodenal ulcers 06/10/2015  . Nausea & vomiting 05/27/2015  . Hyperlipidemia 05/07/2015  . Migraines 04/21/2015  . Mood change 03/19/2015  . Essential hypertension 02/16/2015  . Low back pain 02/16/2015    Past Surgical History:  Procedure Laterality Date  . APPENDECTOMY  1980s   post op bleeding sent him back for ex lap and hemostatic intervention.   Marland Kitchen BACK SURGERY     8 surgeries total - fused from L1-S1  . COLONOSCOPY     Age 51. For bleeding  . ESOPHAGOGASTRODUODENOSCOPY N/A 06/10/2015   Procedure: ESOPHAGOGASTRODUODENOSCOPY (EGD);  Surgeon: Lafayette Dragon, MD;  Location: [facility] ENDOSCOPY;  Service: Endoscopy;  Laterality: N/A;  . ex lap with peri gastric vessel ligation  1990s   after retching, he rupture a blood vessel "outside" the stomach.  vessel was ligated per pt's descriiption.   Fatima Blank HERNIA REPAIR Right 10/21/2016   Procedure: OPEN FLANK HERNIA REPAIR WITH MESH;  Surgeon: Ralene Ok, MD;  Location: [place];  Service: General;  Laterality: Right;  . INSERTION OF MESH Right 10/21/2016   Procedure: INSERTION OF MESH;  Surgeon: Ralene Ok, MD;  Location: [place];  Service: General;  Laterality: Right;  . KNEE SURGERY     Multiple bilateral scopes  . RETINAL DETACHMENT SURGERY Right   . SHOULDER SURGERY     4x on the left - total shoulder replacement.   Marland Kitchen SHOULDER SURGERY Left    x2  . TOTAL SHOULDER ARTHROPLASTY Left    x2    Family  History  Problem Relation Age of Onset  . Arthritis Mother   . Hyperlipidemia Mother   . Hypertension Father   . Colon cancer Maternal Grandmother     Social History   Socioeconomic History  . Marital status: Unknown    Spouse name: Not on file  . Number of children: 0  . Years of education: 30  . Highest education level: Not on file  Occupational History  . Occupation: Disability    Comment: Former Animal nutritionist  . Financial resource strain: Not on file  . Food insecurity:    Worry: Not on file     Inability: Not on file  . Transportation needs:    Medical: Not on file    Non-medical: Not on file  Tobacco Use  . Smoking status: Current Every Day Smoker    Years: 24.00    Types: Cigarettes  . Smokeless tobacco: Never Used  Substance and Sexual Activity  . Alcohol use: Yes    Alcohol/week: 0.0 oz    Comment: rarely  . Drug use: No  . Sexual activity: Not on file  Lifestyle  . Physical activity:    Days per week: Not on file    Minutes per session: Not on file  . Stress: Not on file  Relationships  . Social connections:    Talks on phone: Not on file    Gets together: Not on file    Attends religious service: Not on file    Active member of club or organization: Not on file    Attends meetings of clubs or organizations: Not on file    Relationship status: Not on file  . Intimate partner violence:    Fear of current or ex partner: Not on file    Emotionally abused: Not on file    Physically abused: Not on file    Forced sexual activity: Not on file  Other Topics Concern  . Not on file  Social History Narrative   Fun: play guitar / limited funds      Denies any religious beliefs effecting health care.      Current Outpatient Medications:  .  amLODipine (NORVASC) 10 MG tablet, Take 1 tablet (10 mg total) by mouth daily., Disp: 90 tablet, Rfl: 2 .  butalbital-acetaminophen-caffeine (FIORICET, ESGIC) 50-325-40 MG tablet, Take 1 tablet by mouth every 6 (six) hours as needed for headache., Disp: 20 tablet, Rfl: 0 .  Coenzyme Q10 (COQ10) 100 MG CAPS, Take 100 mg by mouth daily., Disp: , Rfl:  .  fluticasone (FLONASE) 50 MCG/ACT nasal spray, USE 2 SPRAYS INTO BOTH NOSTRILS DAILY. MUST MAKE APPT W/NEW PROVIDER FOR FUTURE REFILLS, Disp: 16 g, Rfl: 1 .  Lidocaine 4 % PTCH, Place 1-2 patches onto the skin 2 (two) times daily as needed (for pain.). Salonpas Maximum Strength Pain Relieving Gel-Patch, Disp: , Rfl:  .  morphine (KADIAN) 80 MG 24 hr capsule, Take 80 mg by mouth 2  (two) times daily., Disp: , Rfl:  .  Multiple Vitamin (MULTIVITAMIN WITH MINERALS) TABS tablet, Take 1 tablet by mouth daily., Disp: , Rfl:  .  Omega-3 Fatty Acids (OMEGA 3 PO), Take 120 mg by mouth daily., Disp: , Rfl:  .  ondansetron (ZOFRAN) 4 MG tablet, TAKE 1 TABLET BY MOUTH EVERY 8 HOURS AS NEEDED FOR NAUSEA OR  VOMITING, Disp: 20 tablet, Rfl: 0 .  Red Yeast Rice Extract (RED YEAST RICE PO), Take 1 capsule by mouth daily., Disp: , Rfl:  .  testosterone cypionate (DEPOTESTOSTERONE CYPIONATE) 200 MG/ML injection, , Disp: , Rfl:   Current Facility-Administered Medications:  .  0.9 %  sodium chloride infusion, 500 mL, Intravenous, Continuous, Danis, Kirke Corin, MD  Allergies  Allergen Reactions  . Fentanyl Other (See Comments)    Hallucinations (with the patch)   . Lisinopril Swelling  . Lyrica [Pregabalin]     hallucinations  . Sulfa Antibiotics Swelling     ROS See HPI  Objective  Vitals:   03/08/18 1547  BP: 124/68  Pulse: (!) 112  Resp: 16  Temp: 99.3 F (37.4 C)  TempSrc: Oral  SpO2: 98%  Weight: 180 lb (81.6 kg)  Height: 5\' 11"  (1.803 m)   Body mass index is 25.1 kg/m.  Physical Exam Vital signs reviewed. Constitutional: Patient appears well-developed and well-nourished. No distress.  HENT: Head: Normocephalic and atraumatic. Ears: Bilateral TMs without erythema or effusion; Nose: Nose normal. Mouth/Throat: Oropharynx is clear and moist. No oropharyngeal exudate.  Eyes: Conjunctivae and EOM are normal. Pupils are equal, round, and reactive to light. No scleral icterus.  Neck: Normal range of motion. Neck supple. Cardiovascular: Normal rate, regular rhythm and normal heart sounds. No BLE edema. Distal pulses intact. Pulmonary/Chest: Effort normal and breath sounds normal. No respiratory distress. Musculoskeletal: Normal range of motion, No gross deformities Neurological: He is alert and oriented to person, place, and time. No cranial nerve deficit.  Coordination, balance, strength, speech and gait are normal.  Skin: Skin is warm and dry. No rash noted. No erythema.  Psychiatric: Patient has a normal mood. Judgment and thought content normal. He is restless, he can not sit still during examination.   Assessment & Plan RTC in 3 months for F/U of HTN, mgiraines-referring to neurology, AWV

## 2018-03-10 ENCOUNTER — Encounter: Payer: Self-pay | Admitting: Nurse Practitioner

## 2018-03-10 NOTE — Assessment & Plan Note (Signed)
History of migraines complicated by neck pain He was given prn fioricet RX at last OV which helps but admits he has been taking the medication more frequently for his migraines He has been on other medications for migraines in the past that he did not like inlcuding triptans We discussed referral to neurology today for further evaluation and management of his migraines  Reassuring that his migraines have decreased in frequency and are relieved by OTC medications We discussed avoiding fioricet in the future due to addictive nature of medication and no refills will be given today He will continue F/U with orthopedic provider for his neck pain - Ambulatory referral to Neurology RTC in 3 months for F/U

## 2018-03-10 NOTE — Assessment & Plan Note (Addendum)
Although his BP does appear well controlled at this time we discussed changing his BP medication due to headaches, as this could be an adverse effect of his amlodipine  He has had adverse reactions to other blood pressure medications in the past: Angioedema to lisinopril, metoprolol made him feel bad, and would prefer to keep his amlodipine as is for now I am sending him to neurology for migraine evaluation so we will continue amlodipine at current dosage for now. Encouraged to continue monitoring BP at home RTC in 3 months for F/U

## 2018-03-12 DIAGNOSIS — J209 Acute bronchitis, unspecified: Secondary | ICD-10-CM | POA: Diagnosis not present

## 2018-03-12 DIAGNOSIS — R509 Fever, unspecified: Secondary | ICD-10-CM | POA: Diagnosis not present

## 2018-03-12 DIAGNOSIS — R0981 Nasal congestion: Secondary | ICD-10-CM | POA: Diagnosis not present

## 2018-03-12 DIAGNOSIS — R05 Cough: Secondary | ICD-10-CM | POA: Diagnosis not present

## 2018-03-15 DIAGNOSIS — M961 Postlaminectomy syndrome, not elsewhere classified: Secondary | ICD-10-CM | POA: Diagnosis not present

## 2018-03-15 DIAGNOSIS — G894 Chronic pain syndrome: Secondary | ICD-10-CM | POA: Diagnosis not present

## 2018-03-15 DIAGNOSIS — M4722 Other spondylosis with radiculopathy, cervical region: Secondary | ICD-10-CM | POA: Diagnosis not present

## 2018-03-15 DIAGNOSIS — Z79891 Long term (current) use of opiate analgesic: Secondary | ICD-10-CM | POA: Diagnosis not present

## 2018-03-16 DIAGNOSIS — R0981 Nasal congestion: Secondary | ICD-10-CM | POA: Diagnosis not present

## 2018-03-16 DIAGNOSIS — J101 Influenza due to other identified influenza virus with other respiratory manifestations: Secondary | ICD-10-CM | POA: Diagnosis not present

## 2018-03-16 DIAGNOSIS — R05 Cough: Secondary | ICD-10-CM | POA: Diagnosis not present

## 2018-03-16 DIAGNOSIS — J209 Acute bronchitis, unspecified: Secondary | ICD-10-CM | POA: Diagnosis not present

## 2018-03-21 DIAGNOSIS — M546 Pain in thoracic spine: Secondary | ICD-10-CM | POA: Diagnosis not present

## 2018-03-21 DIAGNOSIS — M545 Low back pain: Secondary | ICD-10-CM | POA: Diagnosis not present

## 2018-03-21 DIAGNOSIS — M5136 Other intervertebral disc degeneration, lumbar region: Secondary | ICD-10-CM | POA: Diagnosis not present

## 2018-03-21 DIAGNOSIS — R531 Weakness: Secondary | ICD-10-CM | POA: Diagnosis not present

## 2018-03-22 ENCOUNTER — Encounter: Payer: Self-pay | Admitting: Neurology

## 2018-03-23 DIAGNOSIS — R0981 Nasal congestion: Secondary | ICD-10-CM | POA: Diagnosis not present

## 2018-03-23 DIAGNOSIS — J209 Acute bronchitis, unspecified: Secondary | ICD-10-CM | POA: Diagnosis not present

## 2018-03-23 DIAGNOSIS — R1110 Vomiting, unspecified: Secondary | ICD-10-CM | POA: Diagnosis not present

## 2018-03-23 DIAGNOSIS — E559 Vitamin D deficiency, unspecified: Secondary | ICD-10-CM | POA: Diagnosis not present

## 2018-03-23 DIAGNOSIS — R0602 Shortness of breath: Secondary | ICD-10-CM | POA: Diagnosis not present

## 2018-03-23 DIAGNOSIS — R05 Cough: Secondary | ICD-10-CM | POA: Diagnosis not present

## 2018-03-23 DIAGNOSIS — F17200 Nicotine dependence, unspecified, uncomplicated: Secondary | ICD-10-CM | POA: Diagnosis not present

## 2018-03-23 DIAGNOSIS — Z87891 Personal history of nicotine dependence: Secondary | ICD-10-CM | POA: Diagnosis not present

## 2018-03-23 DIAGNOSIS — J101 Influenza due to other identified influenza virus with other respiratory manifestations: Secondary | ICD-10-CM | POA: Diagnosis not present

## 2018-03-26 ENCOUNTER — Other Ambulatory Visit: Payer: Self-pay | Admitting: Family Medicine

## 2018-03-26 DIAGNOSIS — J321 Chronic frontal sinusitis: Secondary | ICD-10-CM

## 2018-03-30 ENCOUNTER — Other Ambulatory Visit: Payer: Self-pay | Admitting: Family Medicine

## 2018-03-30 DIAGNOSIS — R1110 Vomiting, unspecified: Secondary | ICD-10-CM | POA: Diagnosis not present

## 2018-03-30 DIAGNOSIS — J321 Chronic frontal sinusitis: Secondary | ICD-10-CM

## 2018-03-30 DIAGNOSIS — E291 Testicular hypofunction: Secondary | ICD-10-CM | POA: Diagnosis not present

## 2018-03-30 DIAGNOSIS — E559 Vitamin D deficiency, unspecified: Secondary | ICD-10-CM | POA: Diagnosis not present

## 2018-03-30 DIAGNOSIS — J209 Acute bronchitis, unspecified: Secondary | ICD-10-CM | POA: Diagnosis not present

## 2018-04-04 DIAGNOSIS — M546 Pain in thoracic spine: Secondary | ICD-10-CM | POA: Diagnosis not present

## 2018-04-04 DIAGNOSIS — M545 Low back pain: Secondary | ICD-10-CM | POA: Diagnosis not present

## 2018-04-04 DIAGNOSIS — M5136 Other intervertebral disc degeneration, lumbar region: Secondary | ICD-10-CM | POA: Diagnosis not present

## 2018-04-04 DIAGNOSIS — R531 Weakness: Secondary | ICD-10-CM | POA: Diagnosis not present

## 2018-04-12 ENCOUNTER — Ambulatory Visit (INDEPENDENT_AMBULATORY_CARE_PROVIDER_SITE_OTHER): Payer: Medicare Other | Admitting: Specialist

## 2018-04-12 DIAGNOSIS — G894 Chronic pain syndrome: Secondary | ICD-10-CM | POA: Diagnosis not present

## 2018-04-12 DIAGNOSIS — M961 Postlaminectomy syndrome, not elsewhere classified: Secondary | ICD-10-CM | POA: Diagnosis not present

## 2018-04-12 DIAGNOSIS — Z79891 Long term (current) use of opiate analgesic: Secondary | ICD-10-CM | POA: Diagnosis not present

## 2018-04-12 DIAGNOSIS — M4722 Other spondylosis with radiculopathy, cervical region: Secondary | ICD-10-CM | POA: Diagnosis not present

## 2018-04-20 DIAGNOSIS — F411 Generalized anxiety disorder: Secondary | ICD-10-CM | POA: Diagnosis not present

## 2018-04-20 DIAGNOSIS — F17200 Nicotine dependence, unspecified, uncomplicated: Secondary | ICD-10-CM | POA: Diagnosis not present

## 2018-04-20 DIAGNOSIS — Z87891 Personal history of nicotine dependence: Secondary | ICD-10-CM | POA: Diagnosis not present

## 2018-04-27 DIAGNOSIS — E559 Vitamin D deficiency, unspecified: Secondary | ICD-10-CM | POA: Diagnosis not present

## 2018-04-27 DIAGNOSIS — R945 Abnormal results of liver function studies: Secondary | ICD-10-CM | POA: Diagnosis not present

## 2018-04-27 DIAGNOSIS — Z87891 Personal history of nicotine dependence: Secondary | ICD-10-CM | POA: Diagnosis not present

## 2018-04-27 DIAGNOSIS — E7800 Pure hypercholesterolemia, unspecified: Secondary | ICD-10-CM | POA: Diagnosis not present

## 2018-04-27 DIAGNOSIS — Z79899 Other long term (current) drug therapy: Secondary | ICD-10-CM | POA: Diagnosis not present

## 2018-05-01 DIAGNOSIS — M5136 Other intervertebral disc degeneration, lumbar region: Secondary | ICD-10-CM | POA: Diagnosis not present

## 2018-05-01 DIAGNOSIS — R531 Weakness: Secondary | ICD-10-CM | POA: Diagnosis not present

## 2018-05-01 DIAGNOSIS — M546 Pain in thoracic spine: Secondary | ICD-10-CM | POA: Diagnosis not present

## 2018-05-01 DIAGNOSIS — M545 Low back pain: Secondary | ICD-10-CM | POA: Diagnosis not present

## 2018-05-02 ENCOUNTER — Ambulatory Visit (INDEPENDENT_AMBULATORY_CARE_PROVIDER_SITE_OTHER): Payer: Medicare Other | Admitting: Neurology

## 2018-05-02 ENCOUNTER — Encounter: Payer: Self-pay | Admitting: Neurology

## 2018-05-02 VITALS — BP 154/96 | HR 83 | Ht 70.0 in | Wt 180.0 lb

## 2018-05-02 DIAGNOSIS — M5030 Other cervical disc degeneration, unspecified cervical region: Secondary | ICD-10-CM

## 2018-05-02 DIAGNOSIS — G43709 Chronic migraine without aura, not intractable, without status migrainosus: Secondary | ICD-10-CM

## 2018-05-02 MED ORDER — SUMATRIPTAN SUCCINATE 100 MG PO TABS: ORAL_TABLET | ORAL | 2 refills | Status: DC

## 2018-05-02 MED ORDER — NORTRIPTYLINE HCL 10 MG PO CAPS: 10.0000 mg | ORAL_CAPSULE | Freq: Every day | ORAL | 3 refills | Status: DC

## 2018-05-02 NOTE — Patient Instructions (Signed)
Migraine Recommendations: 1.  Start nortriptyline 10mg  at bedtime .  Call in 4 weeks with update and we can adjust dose if needed. 2.  Take sumatriptan 100mg  at earliest onset of headache.  May repeat dose once in 2 hours if needed.  Do not exceed two tablets in 24 hours. 3.  Limit use of pain relievers to no more than 2 days out of the week.  These medications include acetaminophen, ibuprofen, triptans and narcotics.  This will help reduce risk of rebound headaches. 4.  Be aware of common food triggers such as processed sweets, processed foods with nitrites (such as deli meat, hot dogs, sausages), foods with MSG, alcohol (such as wine), chocolate, certain cheeses, certain fruits (dried fruits, bananas, some citrus fruit), vinegar, diet soda. 4.  Avoid caffeine 5.  Routine exercise 6.  Proper sleep hygiene 7.  Stay adequately hydrated with water 8.  Keep a headache diary. 9.  Maintain proper stress management. 10.  Do not skip meals. 11.  Consider supplements:  Magnesium citrate 400mg  to 600mg  daily, riboflavin 400mg , Coenzyme Q 10 100mg  three times daily 12.  Follow up in 3 months.

## 2018-05-02 NOTE — Progress Notes (Signed)
NEUROLOGY CONSULTATION NOTE  Jeremiah [facility] MRN: [id number] DOB: [birth year]  Referring provider: Caesar Chestnut, NP Primary care provider: Caesar Chestnut, NP  Reason for consult:  headache  HISTORY OF PRESENT ILLNESS: Jeremiah [facility] is a 52 year old right-handed male with hypertension, gastritis, hyperlipidemia, chronic neck and back pain and migraine who presents for migraine.  History obtained by patient and PCP note.    Onset:  52 years old Location:  Right sided, starts in occipital region and radiates to behind right eye Quality:  sharp Intensity:  severe Aura:  no Prodrome:  no Associated symptoms:  Nausea, vomiting, osmophobia, some photophobia.  No associated phonophobia, visual disturbance, autonomic symptoms or unilateral numbness or weakness Duration:  All day Frequency:  He has almost daily headache but migraines occur 2 to 3 days a week Triggers/exacerbating factors:  Moving his neck Relieving factors:  Applying pressure to his head Activity:  Aggravates  He has degenerative disc disease of the cervical spine at C5-6 causing right sided spurring and radicular pain (as noted on cervical radiograph from 09/20/17, which was personally reviewed).  This is a common source of his headaches.  He has undergone epidural injections and radiofrequency ablation.  He currently undergoes PT, traction and dry needling.  Since treatment of neck pain, headaches have improved.  Past NSAIDS:  Ibuprofen, Aleve Past analgesics:  Fioricet, Midrin Past abortive triptans:  Sumatriptan (effective) Past muscle relaxants:  Flexeril (side effects), Robaxin (side effects) Past anti-emetic:  no Past antihypertensive medications:  propranolol Past antidepressant medications:  no Past anticonvulsant medications:  topiramate (side effects), gabapentin (for nerve pain, side effects), Lyrica (suicidal ideation) He has had side effects to multiple medications.  However, he had been taking  several of these medications at the same time, so it is uncertain which medications caused side effects or if side effects were due to polypharmacy. Past vitamins/Herbal/Supplements:  no Past antihistamines/decongestants:  no Other past therapies:  no  Current NSAID: no Current analgesic:  Morphine (for chronic back pain) Current triptan:  sumatriptan Antihypertensive medications:  amlodipine Antidepressant medications:  no Anticonvulsant medications:  no Vitamins/Herbal/Supplements:  CoQ10 Other therapy:  no Other medication:  morphine (for back pain), Robaxin (for back pain), Zofran, protonix  Caffeine:  8 12oz bottles of diet Coke daily Alcohol:  No Smoker:  yes Diet:  Needs to increase water Exercise:  PT up to 5 days a week Depression/anxiety:  Situational, yes Sleep hygiene:  Poor.  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 Family history of headache:  Sister, mother  PAST MEDICAL HISTORY: Past Medical History:  Diagnosis Date  . Arthritis   . GERD (gastroesophageal reflux disease)   . Hyperlipidemia   . Hypertension   . Migraines   . Wears glasses     PAST SURGICAL HISTORY: Past Surgical History:  Procedure Laterality Date  . APPENDECTOMY  1980s   post op bleeding sent him back for ex lap and hemostatic intervention.   Marland Kitchen BACK SURGERY     8 surgeries total - fused from L1-S1  . COLONOSCOPY     Age 76. For bleeding  . ESOPHAGOGASTRODUODENOSCOPY N/A 06/10/2015   Procedure: ESOPHAGOGASTRODUODENOSCOPY (EGD);  Surgeon: Lafayette Dragon, MD;  Location: [facility] ENDOSCOPY;  Service: Endoscopy;  Laterality: N/A;  . ex lap with peri gastric vessel ligation  1990s   after retching, he rupture a blood vessel "outside" the stomach.  vessel was ligated per pt's descriiption.   Fatima Blank HERNIA  REPAIR Right 10/21/2016   Procedure: OPEN FLANK HERNIA REPAIR WITH MESH;  Surgeon: Ralene Ok, MD;  Location: [facility];  Service: General;  Laterality: Right;  .  INSERTION OF MESH Right 10/21/2016   Procedure: INSERTION OF MESH;  Surgeon: Ralene Ok, MD;  Location: [facility];  Service: General;  Laterality: Right;  . KNEE SURGERY     Multiple bilateral scopes  . RETINAL DETACHMENT SURGERY Right   . SHOULDER SURGERY     4x on the left - total shoulder replacement.   Marland Kitchen SHOULDER SURGERY Left    x2  . TOTAL SHOULDER ARTHROPLASTY Left    x2    MEDICATIONS: Current Outpatient Medications on File Prior to Visit  Medication Sig Dispense Refill  . amLODipine (NORVASC) 10 MG tablet Take 1 tablet (10 mg total) by mouth daily. 90 tablet 2  . Coenzyme Q10 (COQ10) 100 MG CAPS Take 100 mg by mouth daily.    . fluticasone (FLONASE) 50 MCG/ACT nasal spray USE 2 SPRAYS INTO BOTH NOSTRILS DAILY. MUST MAKE APPT W/NEW PROVIDER FOR FUTURE REFILLS 16 g 1  . Lidocaine 4 % PTCH Place 1-2 patches onto the skin 2 (two) times daily as needed (for pain.). Salonpas Maximum Strength Pain Relieving Gel-Patch    . morphine (KADIAN) 80 MG 24 hr capsule Take 80 mg by mouth 2 (two) times daily.    . Multiple Vitamin (MULTIVITAMIN WITH MINERALS) TABS tablet Take 1 tablet by mouth daily.    . Omega-3 Fatty Acids (OMEGA 3 PO) Take 120 mg by mouth daily.    . ondansetron (ZOFRAN) 4 MG tablet TAKE 1 TABLET BY MOUTH EVERY 8 HOURS AS NEEDED FOR NAUSEA OR  VOMITING 20 tablet 0  . Red Yeast Rice Extract (RED YEAST RICE PO) Take 1 capsule by mouth daily.    Marland Kitchen testosterone cypionate (DEPOTESTOSTERONE CYPIONATE) 200 MG/ML injection      Current Facility-Administered Medications on File Prior to Visit  Medication Dose Route Frequency Provider Last Rate Last Dose  . 0.9 %  sodium chloride infusion  500 mL Intravenous Continuous Doran Stabler, MD        ALLERGIES: Allergies  Allergen Reactions  . Fentanyl Other (See Comments)    Hallucinations (with the patch)   . Lisinopril Swelling  . Lyrica [Pregabalin]     hallucinations  . Sulfa Antibiotics Swelling    FAMILY  HISTORY: Family History  Problem Relation Age of Onset  . Arthritis Mother   . Hyperlipidemia Mother   . Hypertension Father   . Colon cancer Maternal Grandmother   . Hypertension Sister   . Hypertension Paternal Grandmother   . Heart attack Paternal Grandfather     SOCIAL HISTORY: Social History   Socioeconomic History  . Marital status: Single    Spouse name: Not on file  . Number of children: 0  . Years of education: 16  . Highest education level: Bachelor's degree (e.g., BA, AB, BS)  Occupational History  . Occupation: Disability    Comment: Former Animal nutritionist  . Financial resource strain: Not on file  . Food insecurity:    Worry: Not on file    Inability: Not on file  . Transportation needs:    Medical: Not on file    Non-medical: Not on file  Tobacco Use  . Smoking status: Current Every Day Smoker    Years: 24.00    Types: Cigarettes  . Smokeless tobacco: Never Used  Substance and Sexual Activity  .  Alcohol use: Yes    Alcohol/week: 0.0 oz    Comment: rarely  . Drug use: No  . Sexual activity: Not on file  Lifestyle  . Physical activity:    Days per week: Not on file    Minutes per session: Not on file  . Stress: Not on file  Relationships  . Social connections:    Talks on phone: Not on file    Gets together: Not on file    Attends religious service: Not on file    Active member of club or organization: Not on file    Attends meetings of clubs or organizations: Not on file    Relationship status: Not on file  . Intimate partner violence:    Fear of current or ex partner: Not on file    Emotionally abused: Not on file    Physically abused: Not on file    Forced sexual activity: Not on file  Other Topics Concern  . Not on file  Social History Narrative   Fun: play guitar / limited funds      Denies any religious beliefs effecting health care.     REVIEW OF SYSTEMS: Constitutional: No fevers, chills, or sweats, no generalized fatigue,  change in appetite Eyes: No visual changes, double vision, eye pain Ear, nose and throat: No hearing loss, ear pain, nasal congestion, sore throat Cardiovascular: No chest pain, palpitations Respiratory:  No shortness of breath at rest or with exertion, wheezes GastrointestinaI: No nausea, vomiting, diarrhea, abdominal pain, fecal incontinence Genitourinary:  No dysuria, urinary retention or frequency Musculoskeletal:  Neck pain, back pain Integumentary: No rash, pruritus, skin lesions Neurological: as above Psychiatric: No depression, insomnia, anxiety Endocrine: No palpitations, fatigue, diaphoresis, mood swings, change in appetite, change in weight, increased thirst Hematologic/Lymphatic:  No purpura, petechiae. Allergic/Immunologic: no itchy/runny eyes, nasal congestion, recent allergic reactions, rashes  PHYSICAL EXAM: Vitals:   05/02/18 0913  BP: (!) 154/96  Pulse: 83  SpO2: 97%   General: No acute distress.  Patient appears well-groomed.  Head:  Normocephalic/atraumatic Eyes:  fundi examined but not visualized Neck: supple, right paraspinal tenderness, full range of motion Back: No paraspinal tenderness Heart: regular rate and rhythm Lungs: Clear to auscultation bilaterally. Vascular: No carotid bruits. Neurological Exam: Mental status: alert and oriented to person, place, and time, recent and remote memory intact, fund of knowledge intact, attention and concentration intact, speech fluent and not dysarthric, language intact. Cranial nerves: CN I: not tested CN II: pupils equal, round and reactive to light, visual fields intact CN III, IV, VI:  full range of motion, no nystagmus, no ptosis CN V: facial sensation intact CN VII: upper and lower face symmetric CN VIII: hearing intact CN IX, X: gag intact, uvula midline CN XI: sternocleidomastoid and trapezius muscles intact CN XII: tongue midline Bulk & Tone: normal, no fasciculations. Motor:  5/5 throughout   Sensation: temperature and vibration sensation intact. Deep Tendon Reflexes:  2+ throughout, toes downgoing.  Finger to nose testing:  Without dysmetria.  Heel to shin:  Without dysmetria.  Gait:  Normal station and stride.  Able to turn and tandem walk. Romberg negative.  IMPRESSION: Chronic migraine without aura, not intractable Degenerative disc disease of cervical spine, C5-6  PLAN: 1.  Start nortriptyline 10mg  at bedtime .  Call in 4 weeks with update and we can adjust dose if needed. 2.  Take sumatriptan 100mg  at earliest onset of headache.  May repeat dose once in 2 hours if needed.  Do not exceed two tablets in 24 hours. 3.  Limit use of pain relievers to no more than 2 days out of the week.  These medications include acetaminophen, ibuprofen, triptans and narcotics.  This will help reduce risk of rebound headaches. 4.  Be aware of common food triggers such as processed sweets, processed foods with nitrites (such as deli meat, hot dogs, sausages), foods with MSG, alcohol (such as wine), chocolate, certain cheeses, certain fruits (dried fruits, bananas, some citrus fruit), vinegar, diet soda. 4.  Avoid caffeine, smoking cessation 5.  Routine exercise 6.  Proper sleep hygiene 7.  Stay adequately hydrated with water 8.  Keep a headache diary. 9.  Maintain proper stress management. 10.  Do not skip meals. 11.  Consider supplements:  Magnesium citrate 400mg  to 600mg  daily, riboflavin 400mg , Coenzyme Q 10 100mg  three times daily 12.  Follow up in 3 months.  Thank you for allowing me to take part in the care of this patient.  Metta Clines, DO  CC:  Caesar Chestnut, NP

## 2018-05-10 DIAGNOSIS — M961 Postlaminectomy syndrome, not elsewhere classified: Secondary | ICD-10-CM | POA: Diagnosis not present

## 2018-05-10 DIAGNOSIS — Z79891 Long term (current) use of opiate analgesic: Secondary | ICD-10-CM | POA: Diagnosis not present

## 2018-05-10 DIAGNOSIS — M4722 Other spondylosis with radiculopathy, cervical region: Secondary | ICD-10-CM | POA: Diagnosis not present

## 2018-05-10 DIAGNOSIS — G894 Chronic pain syndrome: Secondary | ICD-10-CM | POA: Diagnosis not present

## 2018-05-11 DIAGNOSIS — E782 Mixed hyperlipidemia: Secondary | ICD-10-CM | POA: Diagnosis not present

## 2018-05-11 DIAGNOSIS — I10 Essential (primary) hypertension: Secondary | ICD-10-CM | POA: Diagnosis not present

## 2018-05-11 DIAGNOSIS — Z87891 Personal history of nicotine dependence: Secondary | ICD-10-CM | POA: Diagnosis not present

## 2018-05-22 DIAGNOSIS — M546 Pain in thoracic spine: Secondary | ICD-10-CM | POA: Diagnosis not present

## 2018-05-22 DIAGNOSIS — R531 Weakness: Secondary | ICD-10-CM | POA: Diagnosis not present

## 2018-05-22 DIAGNOSIS — M545 Low back pain: Secondary | ICD-10-CM | POA: Diagnosis not present

## 2018-05-22 DIAGNOSIS — M5136 Other intervertebral disc degeneration, lumbar region: Secondary | ICD-10-CM | POA: Diagnosis not present

## 2018-05-24 ENCOUNTER — Other Ambulatory Visit: Payer: Self-pay | Admitting: Neurology

## 2018-05-29 DIAGNOSIS — G894 Chronic pain syndrome: Secondary | ICD-10-CM | POA: Diagnosis not present

## 2018-05-29 DIAGNOSIS — M4722 Other spondylosis with radiculopathy, cervical region: Secondary | ICD-10-CM | POA: Diagnosis not present

## 2018-05-29 DIAGNOSIS — Z79891 Long term (current) use of opiate analgesic: Secondary | ICD-10-CM | POA: Diagnosis not present

## 2018-05-29 DIAGNOSIS — M961 Postlaminectomy syndrome, not elsewhere classified: Secondary | ICD-10-CM | POA: Diagnosis not present

## 2018-06-01 DIAGNOSIS — R972 Elevated prostate specific antigen [PSA]: Secondary | ICD-10-CM | POA: Diagnosis not present

## 2018-06-01 DIAGNOSIS — E291 Testicular hypofunction: Secondary | ICD-10-CM | POA: Diagnosis not present

## 2018-06-13 DIAGNOSIS — N5201 Erectile dysfunction due to arterial insufficiency: Secondary | ICD-10-CM | POA: Diagnosis not present

## 2018-06-13 DIAGNOSIS — N486 Induration penis plastica: Secondary | ICD-10-CM | POA: Diagnosis not present

## 2018-06-13 DIAGNOSIS — E291 Testicular hypofunction: Secondary | ICD-10-CM | POA: Diagnosis not present

## 2018-06-15 DIAGNOSIS — E782 Mixed hyperlipidemia: Secondary | ICD-10-CM | POA: Diagnosis not present

## 2018-06-15 DIAGNOSIS — Z87891 Personal history of nicotine dependence: Secondary | ICD-10-CM | POA: Diagnosis not present

## 2018-06-15 DIAGNOSIS — M79641 Pain in right hand: Secondary | ICD-10-CM | POA: Diagnosis not present

## 2018-06-15 DIAGNOSIS — F17200 Nicotine dependence, unspecified, uncomplicated: Secondary | ICD-10-CM | POA: Diagnosis not present

## 2018-06-15 DIAGNOSIS — I10 Essential (primary) hypertension: Secondary | ICD-10-CM | POA: Diagnosis not present

## 2018-06-20 DIAGNOSIS — R531 Weakness: Secondary | ICD-10-CM | POA: Diagnosis not present

## 2018-06-20 DIAGNOSIS — M546 Pain in thoracic spine: Secondary | ICD-10-CM | POA: Diagnosis not present

## 2018-06-20 DIAGNOSIS — M545 Low back pain: Secondary | ICD-10-CM | POA: Diagnosis not present

## 2018-06-20 DIAGNOSIS — M5136 Other intervertebral disc degeneration, lumbar region: Secondary | ICD-10-CM | POA: Diagnosis not present

## 2018-06-26 DIAGNOSIS — Z79891 Long term (current) use of opiate analgesic: Secondary | ICD-10-CM | POA: Diagnosis not present

## 2018-06-26 DIAGNOSIS — M961 Postlaminectomy syndrome, not elsewhere classified: Secondary | ICD-10-CM | POA: Diagnosis not present

## 2018-06-26 DIAGNOSIS — M4722 Other spondylosis with radiculopathy, cervical region: Secondary | ICD-10-CM | POA: Diagnosis not present

## 2018-06-26 DIAGNOSIS — G894 Chronic pain syndrome: Secondary | ICD-10-CM | POA: Diagnosis not present

## 2018-06-30 DIAGNOSIS — L03211 Cellulitis of face: Secondary | ICD-10-CM | POA: Diagnosis not present

## 2018-07-17 DIAGNOSIS — M961 Postlaminectomy syndrome, not elsewhere classified: Secondary | ICD-10-CM | POA: Diagnosis not present

## 2018-07-17 DIAGNOSIS — G894 Chronic pain syndrome: Secondary | ICD-10-CM | POA: Diagnosis not present

## 2018-07-17 DIAGNOSIS — Z79891 Long term (current) use of opiate analgesic: Secondary | ICD-10-CM | POA: Diagnosis not present

## 2018-07-17 DIAGNOSIS — M4722 Other spondylosis with radiculopathy, cervical region: Secondary | ICD-10-CM | POA: Diagnosis not present

## 2018-07-21 ENCOUNTER — Other Ambulatory Visit: Payer: Self-pay | Admitting: Neurology

## 2018-07-31 DIAGNOSIS — R531 Weakness: Secondary | ICD-10-CM | POA: Diagnosis not present

## 2018-07-31 DIAGNOSIS — M545 Low back pain: Secondary | ICD-10-CM | POA: Diagnosis not present

## 2018-07-31 DIAGNOSIS — M546 Pain in thoracic spine: Secondary | ICD-10-CM | POA: Diagnosis not present

## 2018-07-31 DIAGNOSIS — M5136 Other intervertebral disc degeneration, lumbar region: Secondary | ICD-10-CM | POA: Diagnosis not present

## 2018-08-09 DIAGNOSIS — I10 Essential (primary) hypertension: Secondary | ICD-10-CM | POA: Diagnosis not present

## 2018-08-09 DIAGNOSIS — E559 Vitamin D deficiency, unspecified: Secondary | ICD-10-CM | POA: Diagnosis not present

## 2018-08-09 DIAGNOSIS — R0602 Shortness of breath: Secondary | ICD-10-CM | POA: Diagnosis not present

## 2018-08-09 DIAGNOSIS — Z125 Encounter for screening for malignant neoplasm of prostate: Secondary | ICD-10-CM | POA: Diagnosis not present

## 2018-08-09 DIAGNOSIS — M129 Arthropathy, unspecified: Secondary | ICD-10-CM | POA: Diagnosis not present

## 2018-08-09 DIAGNOSIS — R5383 Other fatigue: Secondary | ICD-10-CM | POA: Diagnosis not present

## 2018-08-09 DIAGNOSIS — Z0000 Encounter for general adult medical examination without abnormal findings: Secondary | ICD-10-CM | POA: Diagnosis not present

## 2018-08-09 DIAGNOSIS — E7800 Pure hypercholesterolemia, unspecified: Secondary | ICD-10-CM | POA: Diagnosis not present

## 2018-08-16 DIAGNOSIS — I10 Essential (primary) hypertension: Secondary | ICD-10-CM | POA: Diagnosis not present

## 2018-08-16 DIAGNOSIS — R9431 Abnormal electrocardiogram [ECG] [EKG]: Secondary | ICD-10-CM | POA: Diagnosis not present

## 2018-08-20 ENCOUNTER — Other Ambulatory Visit: Payer: Self-pay | Admitting: Nurse Practitioner

## 2018-08-20 ENCOUNTER — Other Ambulatory Visit: Payer: Self-pay | Admitting: Neurology

## 2018-08-20 DIAGNOSIS — I10 Essential (primary) hypertension: Secondary | ICD-10-CM

## 2018-08-21 ENCOUNTER — Other Ambulatory Visit: Payer: Self-pay | Admitting: Family Medicine

## 2018-08-21 DIAGNOSIS — J321 Chronic frontal sinusitis: Secondary | ICD-10-CM

## 2018-08-21 DIAGNOSIS — G894 Chronic pain syndrome: Secondary | ICD-10-CM | POA: Diagnosis not present

## 2018-08-21 DIAGNOSIS — L300 Nummular dermatitis: Secondary | ICD-10-CM | POA: Diagnosis not present

## 2018-08-21 DIAGNOSIS — M4722 Other spondylosis with radiculopathy, cervical region: Secondary | ICD-10-CM | POA: Diagnosis not present

## 2018-08-21 DIAGNOSIS — Z79891 Long term (current) use of opiate analgesic: Secondary | ICD-10-CM | POA: Diagnosis not present

## 2018-08-21 DIAGNOSIS — L821 Other seborrheic keratosis: Secondary | ICD-10-CM | POA: Diagnosis not present

## 2018-08-21 DIAGNOSIS — L700 Acne vulgaris: Secondary | ICD-10-CM | POA: Diagnosis not present

## 2018-08-21 DIAGNOSIS — M961 Postlaminectomy syndrome, not elsewhere classified: Secondary | ICD-10-CM | POA: Diagnosis not present

## 2018-08-21 DIAGNOSIS — L814 Other melanin hyperpigmentation: Secondary | ICD-10-CM | POA: Diagnosis not present

## 2018-08-21 DIAGNOSIS — D229 Melanocytic nevi, unspecified: Secondary | ICD-10-CM | POA: Diagnosis not present

## 2018-08-23 DIAGNOSIS — Z79899 Other long term (current) drug therapy: Secondary | ICD-10-CM | POA: Diagnosis not present

## 2018-08-23 DIAGNOSIS — I10 Essential (primary) hypertension: Secondary | ICD-10-CM | POA: Diagnosis not present

## 2018-08-23 DIAGNOSIS — E782 Mixed hyperlipidemia: Secondary | ICD-10-CM | POA: Diagnosis not present

## 2018-08-23 DIAGNOSIS — E559 Vitamin D deficiency, unspecified: Secondary | ICD-10-CM | POA: Diagnosis not present

## 2018-08-28 ENCOUNTER — Ambulatory Visit: Payer: Self-pay | Admitting: Neurology

## 2018-09-18 DIAGNOSIS — Z79891 Long term (current) use of opiate analgesic: Secondary | ICD-10-CM | POA: Diagnosis not present

## 2018-09-18 DIAGNOSIS — M4722 Other spondylosis with radiculopathy, cervical region: Secondary | ICD-10-CM | POA: Diagnosis not present

## 2018-09-18 DIAGNOSIS — M961 Postlaminectomy syndrome, not elsewhere classified: Secondary | ICD-10-CM | POA: Diagnosis not present

## 2018-09-18 DIAGNOSIS — G894 Chronic pain syndrome: Secondary | ICD-10-CM | POA: Diagnosis not present

## 2018-09-25 DIAGNOSIS — R0602 Shortness of breath: Secondary | ICD-10-CM | POA: Diagnosis not present

## 2018-09-25 DIAGNOSIS — R9431 Abnormal electrocardiogram [ECG] [EKG]: Secondary | ICD-10-CM | POA: Diagnosis not present

## 2018-09-25 DIAGNOSIS — I10 Essential (primary) hypertension: Secondary | ICD-10-CM | POA: Diagnosis not present

## 2018-10-03 DIAGNOSIS — M545 Low back pain: Secondary | ICD-10-CM | POA: Diagnosis not present

## 2018-10-03 DIAGNOSIS — M546 Pain in thoracic spine: Secondary | ICD-10-CM | POA: Diagnosis not present

## 2018-10-03 DIAGNOSIS — M5136 Other intervertebral disc degeneration, lumbar region: Secondary | ICD-10-CM | POA: Diagnosis not present

## 2018-10-03 DIAGNOSIS — R531 Weakness: Secondary | ICD-10-CM | POA: Diagnosis not present

## 2018-10-11 DIAGNOSIS — M5136 Other intervertebral disc degeneration, lumbar region: Secondary | ICD-10-CM | POA: Diagnosis not present

## 2018-10-11 DIAGNOSIS — M545 Low back pain: Secondary | ICD-10-CM | POA: Diagnosis not present

## 2018-10-11 DIAGNOSIS — R531 Weakness: Secondary | ICD-10-CM | POA: Diagnosis not present

## 2018-10-11 DIAGNOSIS — M546 Pain in thoracic spine: Secondary | ICD-10-CM | POA: Diagnosis not present

## 2018-10-16 DIAGNOSIS — M5136 Other intervertebral disc degeneration, lumbar region: Secondary | ICD-10-CM | POA: Diagnosis not present

## 2018-10-16 DIAGNOSIS — M961 Postlaminectomy syndrome, not elsewhere classified: Secondary | ICD-10-CM | POA: Diagnosis not present

## 2018-10-16 DIAGNOSIS — R531 Weakness: Secondary | ICD-10-CM | POA: Diagnosis not present

## 2018-10-16 DIAGNOSIS — M4722 Other spondylosis with radiculopathy, cervical region: Secondary | ICD-10-CM | POA: Diagnosis not present

## 2018-10-16 DIAGNOSIS — Z79891 Long term (current) use of opiate analgesic: Secondary | ICD-10-CM | POA: Diagnosis not present

## 2018-10-16 DIAGNOSIS — G894 Chronic pain syndrome: Secondary | ICD-10-CM | POA: Diagnosis not present

## 2018-10-16 DIAGNOSIS — M546 Pain in thoracic spine: Secondary | ICD-10-CM | POA: Diagnosis not present

## 2018-10-16 DIAGNOSIS — M545 Low back pain: Secondary | ICD-10-CM | POA: Diagnosis not present

## 2018-10-22 DIAGNOSIS — M545 Low back pain: Secondary | ICD-10-CM | POA: Diagnosis not present

## 2018-10-22 DIAGNOSIS — M546 Pain in thoracic spine: Secondary | ICD-10-CM | POA: Diagnosis not present

## 2018-10-22 DIAGNOSIS — R531 Weakness: Secondary | ICD-10-CM | POA: Diagnosis not present

## 2018-10-22 DIAGNOSIS — M5136 Other intervertebral disc degeneration, lumbar region: Secondary | ICD-10-CM | POA: Diagnosis not present

## 2018-10-30 DIAGNOSIS — M545 Low back pain: Secondary | ICD-10-CM | POA: Diagnosis not present

## 2018-10-30 DIAGNOSIS — M4722 Other spondylosis with radiculopathy, cervical region: Secondary | ICD-10-CM | POA: Diagnosis not present

## 2018-10-30 DIAGNOSIS — M5136 Other intervertebral disc degeneration, lumbar region: Secondary | ICD-10-CM | POA: Diagnosis not present

## 2018-10-30 DIAGNOSIS — M961 Postlaminectomy syndrome, not elsewhere classified: Secondary | ICD-10-CM | POA: Diagnosis not present

## 2018-10-30 DIAGNOSIS — Z79891 Long term (current) use of opiate analgesic: Secondary | ICD-10-CM | POA: Diagnosis not present

## 2018-10-30 DIAGNOSIS — R531 Weakness: Secondary | ICD-10-CM | POA: Diagnosis not present

## 2018-10-30 DIAGNOSIS — G894 Chronic pain syndrome: Secondary | ICD-10-CM | POA: Diagnosis not present

## 2018-10-30 DIAGNOSIS — M546 Pain in thoracic spine: Secondary | ICD-10-CM | POA: Diagnosis not present

## 2018-11-07 DIAGNOSIS — R531 Weakness: Secondary | ICD-10-CM | POA: Diagnosis not present

## 2018-11-07 DIAGNOSIS — M5136 Other intervertebral disc degeneration, lumbar region: Secondary | ICD-10-CM | POA: Diagnosis not present

## 2018-11-07 DIAGNOSIS — M545 Low back pain: Secondary | ICD-10-CM | POA: Diagnosis not present

## 2018-11-07 DIAGNOSIS — M546 Pain in thoracic spine: Secondary | ICD-10-CM | POA: Diagnosis not present

## 2019-01-24 ENCOUNTER — Encounter (INDEPENDENT_AMBULATORY_CARE_PROVIDER_SITE_OTHER): Payer: Self-pay | Admitting: Specialist

## 2019-01-24 ENCOUNTER — Ambulatory Visit (INDEPENDENT_AMBULATORY_CARE_PROVIDER_SITE_OTHER): Payer: Medicare Other | Admitting: Specialist

## 2019-01-24 ENCOUNTER — Ambulatory Visit (INDEPENDENT_AMBULATORY_CARE_PROVIDER_SITE_OTHER): Payer: Medicare Other

## 2019-01-24 VITALS — BP 149/95 | HR 65 | Ht 71.0 in | Wt 180.0 lb

## 2019-01-24 DIAGNOSIS — Z981 Arthrodesis status: Secondary | ICD-10-CM | POA: Diagnosis not present

## 2019-01-24 DIAGNOSIS — M5134 Other intervertebral disc degeneration, thoracic region: Secondary | ICD-10-CM

## 2019-01-24 DIAGNOSIS — M5135 Other intervertebral disc degeneration, thoracolumbar region: Secondary | ICD-10-CM | POA: Diagnosis not present

## 2019-01-24 DIAGNOSIS — M4030 Flatback syndrome, site unspecified: Secondary | ICD-10-CM

## 2019-01-24 DIAGNOSIS — M47815 Spondylosis without myelopathy or radiculopathy, thoracolumbar region: Secondary | ICD-10-CM

## 2019-01-24 NOTE — Progress Notes (Signed)
Office Visit Note   Patient: Jeremiah [facility]           Date of Birth: December 21, 1965           MRN: [id number] Visit Date: 01/24/2019              Requested by: Lance Sell, NP [place], [place] [id number] PCP: Lance Sell, NP   Assessment & Plan: Visit Diagnoses:  1. Status post lumbar spinal fusion   2. Flat back syndrome, acquired   3. DDD (degenerative disc disease), thoracic   4. DDD (degenerative disc disease), thoracolumbar   5. Spondylosis of thoracolumbar spine   53 year old male with a long lumbar fusion to L1, he has adjacent proximal changes T8-9 through the T12-L1 level. No focal deficit, This is mechanical and eventually may reqirew extension of the fusion. But neurologically there is no sign of  Cord or conus abnormality.  Plan: Avoid frequent bending and stooping  No lifting greater than 10 lbs. May use ice or moist heat for pain. Weight loss is of benefit. Best medication for lumbar disc disease is arthritis medications like motrin, celebrex and naprosyn. Exercise is important to improve your indurance and does allow people to function better inspite of back pain. Work on Copywriter, advertising, pool exercise    Follow-Up Instructions: Return in about 4 weeks (around 02/21/2019).   Orders:  Orders Placed This Encounter  Procedures  . XR Lumbar Spine 2-3 Views   No orders of the defined types were placed in this encounter.     Procedures: No procedures performed   Clinical Data: No additional findings.   Subjective: Chief Complaint  Patient presents with  . 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 He has undergone a previous long lumbar fusion in [place]. He is having increased mid back pain at the Upper lumbar and the thoracolumbar fusion, pain worsened over the period from this January and the beginning of February. The patient he was taking  care of the family decided to have take a break and is with other family out of town. He is seeing Theme park manager and is undergoing dry needling of the neck. He is now at a reduced level of stress, he is not doing direct patient care  For the last month. Consequently his pain levels are better. It hurts when he lies flat and doen't  Do anything. Taking some zanaflex. There was one week when his pain had worsened to an "8". He take morphine IR and ER for pain control.The area of right flank hernia is better and he feel like he has nearly forgotten about the area, no numbness in the area of the right flank hernia. He is seeing PT and Art S. Doing cervical traction intermittantly.No bowel or badder difficullty. No pain with cough or sneeze.   Review of Systems  Constitutional: Negative.   HENT: Negative.   Eyes: Negative.   Respiratory: Negative.   Cardiovascular: Negative.   Gastrointestinal: Negative.   Endocrine: Negative.   Genitourinary: Negative.   Musculoskeletal: Negative.   Skin: Negative.   Allergic/Immunologic: Negative.   Neurological: Negative.   Hematological: Negative.   Psychiatric/Behavioral: Negative.      Objective: Vital Signs: BP (!) 149/95 (BP Location: Left Arm, Patient Position: Sitting)   Pulse 65   Ht 5\' 11"  (1.803 m)  Wt 180 lb (81.6 kg)   BMI 25.10 kg/m   Physical Exam Constitutional:      Appearance: He is well-developed.  HENT:     Head: Normocephalic and atraumatic.  Eyes:     Pupils: Pupils are equal, round, and reactive to light.  Neck:     Musculoskeletal: Normal range of motion and neck supple.  Pulmonary:     Effort: Pulmonary effort is normal.     Breath sounds: Normal breath sounds.  Abdominal:     General: Bowel sounds are normal.     Palpations: Abdomen is soft.  Skin:    General: Skin is warm and dry.  Neurological:     Mental Status: He is alert and oriented to person, place, and time.  Psychiatric:        Behavior:  Behavior normal.        Thought Content: Thought content normal.        Judgment: Judgment normal.     Back Exam   Tenderness  The patient is experiencing tenderness in the lumbar.  Range of Motion  Extension: abnormal  Flexion:  90 normal  Lateral bend right: normal  Lateral bend left: normal  Rotation right: normal  Rotation left: normal   Muscle Strength  Right Quadriceps:  5/5  Left Quadriceps:  5/5  Right Hamstrings:  5/5  Left Hamstrings:  5/5   Tests  Straight leg raise right: negative Straight leg raise left: negative  Reflexes  Patellar: normal Achilles: normal Babinski's sign: normal   Other  Toe walk: normal Heel walk: normal Sensation: normal Gait: normal  Erythema: no back redness Scars: absent  Comments:  Fullness at the T-L junction posteriorly, no prominent hardware or bursa swelling.  Excellent flexion with ability to reach the floor with hands.       Specialty Comments:  No specialty comments available.  Imaging: Xr Lumbar Spine 2-3 Views  Result Date: 01/24/2019 AP and lateral flexion and extension radiographs show the hardware pedicle screws and rods from L1 to L3 with pedicle screws at L4-S1 without radiographic signs of rods there. Interbody fusions L1-2 through L4-5, L5-S1 disc is open. Overall lordosis is 5 degrees normal is 45-55 degrees. T12-L1 left disc narrowing with minimal osteophytes, Moderate DDD T8-9, T9-10 and T11-12. Hypokyphotic thoracic spine.     PMFS History: Patient Active Problem List   Diagnosis Date Noted  . Neck pain 12/17/2017  . Medicare annual wellness visit, subsequent 01/27/2017  . Abscess of nasal cavity 06/14/2016  . Right flank mass 06/14/2016  . Tobacco use disorder 06/14/2016  . Overweight (BMI 25.0-29.9) 06/14/2016  . Sinusitis, chronic 08/12/2015  . Arthralgia 07/06/2015  . Chronic back pain 06/10/2015  . Multiple duodenal ulcers 06/10/2015  . Hyperlipidemia 05/07/2015  . Migraines  04/21/2015  . Essential hypertension 02/16/2015   Past Medical History:  Diagnosis Date  . Arthritis   . GERD (gastroesophageal reflux disease)   . Hyperlipidemia   . Hypertension   . Migraines   . Wears glasses     Family History  Problem Relation Age of Onset  . Arthritis Mother   . Hyperlipidemia Mother   . Hypertension Father   . Colon cancer Maternal Grandmother   . Hypertension Sister   . Hypertension Paternal Grandmother   . Heart attack Paternal Grandfather     Past Surgical History:  Procedure Laterality Date  . APPENDECTOMY  1980s   post op bleeding sent him back for ex lap and hemostatic intervention.   Marland Kitchen  BACK SURGERY     8 surgeries total - fused from L1-S1  . COLONOSCOPY     Age 40. For bleeding  . ESOPHAGOGASTRODUODENOSCOPY N/A 06/10/2015   Procedure: ESOPHAGOGASTRODUODENOSCOPY (EGD);  Surgeon: Lafayette Dragon, MD;  Location: [facility] ENDOSCOPY;  Service: Endoscopy;  Laterality: N/A;  . ex lap with peri gastric vessel ligation  1990s   after retching, he rupture a blood vessel "outside" the stomach.  vessel was ligated per pt's descriiption.   Fatima Blank HERNIA REPAIR Right 10/21/2016   Procedure: OPEN FLANK HERNIA REPAIR WITH MESH;  Surgeon: Ralene Ok, MD;  Location: [facility];  Service: General;  Laterality: Right;  . INSERTION OF MESH Right 10/21/2016   Procedure: INSERTION OF MESH;  Surgeon: Ralene Ok, MD;  Location: [facility];  Service: General;  Laterality: Right;  . KNEE SURGERY     Multiple bilateral scopes  . RETINAL DETACHMENT SURGERY Right   . SHOULDER SURGERY     4x on the left - total shoulder replacement.   Marland Kitchen SHOULDER SURGERY Left    x2  . TOTAL SHOULDER ARTHROPLASTY Left    x2   Social History   Occupational History  . Occupation: Disability    Comment: Former Therapist, sports  Tobacco Use  . Smoking status: Current Every Day Smoker    Years: 24.00    Types: Cigarettes  . Smokeless tobacco: Never Used  Substance and Sexual Activity  . Alcohol use:  Yes    Alcohol/week: 0.0 standard drinks    Comment: rarely  . Drug use: No  . Sexual activity: Not on file

## 2019-01-24 NOTE — Patient Instructions (Signed)
Plan: Avoid frequent bending and stooping  No lifting greater than 10 lbs. May use ice or moist heat for pain. Weight loss is of benefit. Best medication for lumbar disc disease is arthritis medications like motrin, celebrex and naprosyn. Exercise is important to improve your indurance and does allow people to function better inspite of back pain. Work on Copywriter, advertising, pool exercise

## 2019-07-17 DIAGNOSIS — M47812 Spondylosis without myelopathy or radiculopathy, cervical region: Secondary | ICD-10-CM | POA: Insufficient documentation

## 2019-07-17 DIAGNOSIS — F1190 Opioid use, unspecified, uncomplicated: Secondary | ICD-10-CM | POA: Insufficient documentation

## 2019-07-23 ENCOUNTER — Other Ambulatory Visit: Payer: Self-pay | Admitting: Orthopedic Surgery

## 2019-07-23 DIAGNOSIS — M50121 Cervical disc disorder at C4-C5 level with radiculopathy: Secondary | ICD-10-CM

## 2019-07-26 ENCOUNTER — Ambulatory Visit: Payer: Self-pay | Admitting: Specialist

## 2019-08-09 ENCOUNTER — Other Ambulatory Visit: Payer: Self-pay

## 2019-08-09 ENCOUNTER — Ambulatory Visit
Admission: RE | Admit: 2019-08-09 | Discharge: 2019-08-09 | Disposition: A | Discharge status: Home / Self Care | Payer: 59 | Source: Ambulatory Visit | Attending: Orthopedic Surgery | Admitting: Orthopedic Surgery

## 2019-08-09 DIAGNOSIS — M50121 Cervical disc disorder at C4-C5 level with radiculopathy: Secondary | ICD-10-CM

## 2019-10-31 DIAGNOSIS — M4326 Fusion of spine, lumbar region: Secondary | ICD-10-CM | POA: Insufficient documentation

## 2019-10-31 DIAGNOSIS — M4302 Spondylolysis, cervical region: Secondary | ICD-10-CM | POA: Insufficient documentation

## 2019-11-28 ENCOUNTER — Ambulatory Visit (INDEPENDENT_AMBULATORY_CARE_PROVIDER_SITE_OTHER): Payer: Medicare Other | Admitting: Surgery

## 2019-11-28 ENCOUNTER — Ambulatory Visit: Payer: Self-pay

## 2019-11-28 ENCOUNTER — Other Ambulatory Visit: Payer: Self-pay

## 2019-11-28 ENCOUNTER — Encounter: Payer: Self-pay | Admitting: Surgery

## 2019-11-28 VITALS — BP 146/83 | HR 97 | Ht 71.0 in | Wt 180.0 lb

## 2019-11-28 DIAGNOSIS — G8929 Other chronic pain: Secondary | ICD-10-CM | POA: Diagnosis not present

## 2019-11-28 DIAGNOSIS — M533 Sacrococcygeal disorders, not elsewhere classified: Secondary | ICD-10-CM | POA: Diagnosis not present

## 2019-11-28 DIAGNOSIS — Z981 Arthrodesis status: Secondary | ICD-10-CM | POA: Diagnosis not present

## 2019-11-28 NOTE — Progress Notes (Signed)
Office Visit Note   Patient: Jeremiah Anderson           Date of Birth: 1966/05/20           MRN: [id number] Visit Date: 11/28/2019              Requested by: Sherald Hess., MD [place],  [place] [address] PCP: Sherald Hess., MD   Assessment & Plan: Visit Diagnoses:  1. Status post lumbar spinal fusion   2. Chronic right SI joint pain     Plan: This patient is localizing most of his pain to the right SI joint I think it is reasonable to try a diagnostic/therapeutic right SI joint Marcaine/Depo-Medrol injection.  I sent a referral over to his pain specialist Dr. Nicholaus Bloom to see if he would do this.  If he gets good relief I think he may be a good candidate for radiofrequency ablation.  If it comes down to needing that if Dr. Hardin Negus is not able to do that procedure he could have that done here in our office with Dr. Ernestina Patches.  Follow-up with Dr. Louanne Skye in 6 weeks for recheck.  We will make decision at that time as to whether or not further imaging studies are indicated.  Follow-Up Instructions: Return in about 6 weeks (around 01/09/2020) for With Dr. Louanne Skye.   Orders:  Orders Placed This Encounter  Procedures  . XR Lumbar Spine 2-3 Views  . XR Lumbar Spine Complete  . Ambulatory referral to Pain Clinic   No orders of the defined types were placed in this encounter.     Procedures: No procedures performed   Clinical Data: No additional findings.   Subjective: Chief Complaint  Patient presents with  . Lower Back - Pain    HPI 54 year old white male comes in today with complaints of right lower back pain around the SI joint.  Patient is status post L1-S1 fusion outside of this office.  States that Dr. Vernell Barrier referred him to a spine specialist with [facility].  States that that physician did not recommend any other surgery for his low back.  Patient states that he tries to remain active with riding his bike, doing push-ups and exercising.  The last 2  months he has been having pain over the right SI joint.  States that this started after he was changing a tire on a car.  Denies any lower extremity radiculopathy. Review of Systems No current cardiac pulmonary GI GU issues  Objective: Vital Signs: BP (!) 146/83   Pulse 97   Ht 5\' 11"  (1.803 m)   Wt 180 lb (81.6 kg)   BMI 25.10 kg/m   Physical Exam HENT:     Head: Normocephalic.  Pulmonary:     Effort: No respiratory distress.  Musculoskeletal:     Comments: Patient has moderate tenderness over the right SI joint.  Less tender over the left side.  Neurologically intact.  Neurological:     Mental Status: He is alert.     Ortho Exam  Specialty Comments:  No specialty comments available.  Imaging: No results found.   PMFS History: Patient Active Problem List   Diagnosis Date Noted  . Neck pain 12/17/2017  . Medicare annual wellness visit, subsequent 01/27/2017  . Abscess of nasal cavity 06/14/2016  . Right flank mass 06/14/2016  . Tobacco use disorder 06/14/2016  . Overweight (BMI 25.0-29.9) 06/14/2016  . Sinusitis, chronic 08/12/2015  . Arthralgia 07/06/2015  .  Chronic back pain 06/10/2015  . Multiple duodenal ulcers 06/10/2015  . Hyperlipidemia 05/07/2015  . Migraines 04/21/2015  . Essential hypertension 02/16/2015   Past Medical History:  Diagnosis Date  . Arthritis   . GERD (gastroesophageal reflux disease)   . Hyperlipidemia   . Hypertension   . Migraines   . Wears glasses     Family History  Problem Relation Age of Onset  . Arthritis Mother   . Hyperlipidemia Mother   . Hypertension Father   . Colon cancer Maternal Grandmother   . Hypertension Sister   . Hypertension Paternal Grandmother   . Heart attack Paternal Grandfather     Past Surgical History:  Procedure Laterality Date  . APPENDECTOMY  1980s   post op bleeding sent him back for ex lap and hemostatic intervention.   Marland Kitchen BACK SURGERY     8 surgeries total - fused from L1-S1  .  COLONOSCOPY     Age 29. For bleeding  . ESOPHAGOGASTRODUODENOSCOPY N/A 06/10/2015   Procedure: ESOPHAGOGASTRODUODENOSCOPY (EGD);  Surgeon: Lafayette Dragon, MD;  Location: [facility] ENDOSCOPY;  Service: Endoscopy;  Laterality: N/A;  . ex lap with peri gastric vessel ligation  1990s   after retching, he rupture a blood vessel "outside" the stomach.  vessel was ligated per pt's descriiption.   Fatima Blank HERNIA REPAIR Right 10/21/2016   Procedure: OPEN FLANK HERNIA REPAIR WITH MESH;  Surgeon: Ralene Ok, MD;  Location: [place];  Service: General;  Laterality: Right;  . INSERTION OF MESH Right 10/21/2016   Procedure: INSERTION OF MESH;  Surgeon: Ralene Ok, MD;  Location: [place];  Service: General;  Laterality: Right;  . KNEE SURGERY     Multiple bilateral scopes  . RETINAL DETACHMENT SURGERY Right   . SHOULDER SURGERY     4x on the left - total shoulder replacement.   Marland Kitchen SHOULDER SURGERY Left    x2  . TOTAL SHOULDER ARTHROPLASTY Left    x2   Social History   Occupational History  . Occupation: Disability    Comment: Former Therapist, sports  Tobacco Use  . Smoking status: Current Every Day Smoker    Years: 24.00    Types: Cigarettes  . Smokeless tobacco: Never Used  Substance and Sexual Activity  . Alcohol use: Yes    Alcohol/week: 0.0 standard drinks    Comment: rarely  . Drug use: No  . Sexual activity: Not on file

## 2019-12-12 ENCOUNTER — Encounter: Payer: Self-pay | Admitting: Specialist

## 2019-12-12 ENCOUNTER — Ambulatory Visit (INDEPENDENT_AMBULATORY_CARE_PROVIDER_SITE_OTHER): Payer: Medicare Other | Admitting: Specialist

## 2019-12-12 ENCOUNTER — Other Ambulatory Visit: Payer: Self-pay

## 2019-12-12 VITALS — BP 150/90 | HR 96 | Ht 71.0 in | Wt 180.0 lb

## 2019-12-12 DIAGNOSIS — R1900 Intra-abdominal and pelvic swelling, mass and lump, unspecified site: Secondary | ICD-10-CM

## 2019-12-12 DIAGNOSIS — M4325 Fusion of spine, thoracolumbar region: Secondary | ICD-10-CM | POA: Diagnosis not present

## 2019-12-12 DIAGNOSIS — M6281 Muscle weakness (generalized): Secondary | ICD-10-CM

## 2019-12-12 NOTE — Patient Instructions (Signed)
Avoid frequent bending and stooping  No lifting greater than 10 lbs. May use ice or moist heat for pain. Weight loss is of benefit.  Exercise is important to improve your indurance and does allow people to function better inspite of back pain. Abdomenal CT with contrast and a marker to assess left posterior abdomen and lumbar area for hernia or muscle atrophy. Call Dr.Phillips and ask if it is okay to try Hemp CBD. Hemp CBD capsules, amazon.com 5,000-7,000 mg per bottle, 60 capsules per bottle, take one capsule twice a day.  Follow-Up Instructions: No follow-ups on file.

## 2019-12-12 NOTE — Progress Notes (Signed)
Office Visit Note   Patient: Jeremiah Anderson           Date of Birth: 1966-11-05           MRN: [id number] Visit Date: 12/12/2019              Requested by: Sherald Hess., MD [place],  [place] [address] PCP: Sherald Hess., MD   Assessment & Plan: Visit Diagnoses:  1. Truncal muscle weakness   2. Intra-abdominal and pelvic swelling, mass and lump, unspecified site     Plan: Avoid frequent bending and stooping  No lifting greater than 10 lbs. May use ice or moist heat for pain. Weight loss is of benefit.  Exercise is important to improve your indurance and does allow people to function better inspite of back pain. Abdomenal CT with contrast and a marker to assess left posterior abdomen and lumbar area for hernia or muscle atrophy. Call Dr.Phillips and ask if it is okay to try Hemp CBD. Hemp CBD capsules, amazon.com 5,000-7,000 mg per bottle, 60 capsules per bottle, take one capsule twice a day.  Follow-Up Instructions: No follow-ups on file.   Orders:  No orders of the defined types were placed in this encounter.  No orders of the defined types were placed in this encounter.     Procedures: No procedures performed   Clinical Data: No additional findings.   Subjective: Chief Complaint  Patient presents with  . Lower Back - Follow-up    54 year old male with history of previous ALIF via left inferolateral incision with previous L1 to S1 fusion. He had a right posterolateral abdomenal wall.   Review of Systems   Objective: Vital Signs: BP (!) 150/90 (BP Location: Left Arm, Patient Position: Sitting)   Pulse 96   Ht 5\' 11"  (1.803 m)   Wt 180 lb (81.6 kg)   BMI 25.10 kg/m   Physical Exam  Ortho Exam  Specialty Comments:  No specialty comments available.  Imaging: No results found.   PMFS History: Patient Active Problem List   Diagnosis Date Noted  . Neck pain 12/17/2017  . Medicare annual wellness visit, subsequent  01/27/2017  . Abscess of nasal cavity 06/14/2016  . Right flank mass 06/14/2016  . Tobacco use disorder 06/14/2016  . Overweight (BMI 25.0-29.9) 06/14/2016  . Sinusitis, chronic 08/12/2015  . Arthralgia 07/06/2015  . Chronic back pain 06/10/2015  . Multiple duodenal ulcers 06/10/2015  . Hyperlipidemia 05/07/2015  . Migraines 04/21/2015  . Essential hypertension 02/16/2015   Past Medical History:  Diagnosis Date  . Arthritis   . GERD (gastroesophageal reflux disease)   . Hyperlipidemia   . Hypertension   . Migraines   . Wears glasses     Family History  Problem Relation Age of Onset  . Arthritis Mother   . Hyperlipidemia Mother   . Hypertension Father   . Colon cancer Maternal Grandmother   . Hypertension Sister   . Hypertension Paternal Grandmother   . Heart attack Paternal Grandfather     Past Surgical History:  Procedure Laterality Date  . APPENDECTOMY  1980s   post op bleeding sent him back for ex lap and hemostatic intervention.   Marland Kitchen BACK SURGERY     8 surgeries total - fused from L1-S1  . COLONOSCOPY     Age 43. For bleeding  . ESOPHAGOGASTRODUODENOSCOPY N/A 06/10/2015   Procedure: ESOPHAGOGASTRODUODENOSCOPY (EGD);  Surgeon: Lafayette Dragon, MD;  Location: [facility] ENDOSCOPY;  Service: Endoscopy;  Laterality: N/A;  . ex lap with peri gastric vessel ligation  1990s   after retching, he rupture a blood vessel "outside" the stomach.  vessel was ligated per pt's descriiption.   Fatima Blank HERNIA REPAIR Right 10/21/2016   Procedure: OPEN FLANK HERNIA REPAIR WITH MESH;  Surgeon: Ralene Ok, MD;  Location: [place];  Service: General;  Laterality: Right;  . INSERTION OF MESH Right 10/21/2016   Procedure: INSERTION OF MESH;  Surgeon: Ralene Ok, MD;  Location: [place];  Service: General;  Laterality: Right;  . KNEE SURGERY     Multiple bilateral scopes  . RETINAL DETACHMENT SURGERY Right   . SHOULDER SURGERY     4x on the left - total shoulder replacement.   Marland Kitchen SHOULDER  SURGERY Left    x2  . TOTAL SHOULDER ARTHROPLASTY Left    x2   Social History   Occupational History  . Occupation: Disability    Comment: Former Therapist, sports  Tobacco Use  . Smoking status: Current Every Day Smoker    Years: 24.00    Types: Cigarettes  . Smokeless tobacco: Never Used  Substance and Sexual Activity  . Alcohol use: Yes    Alcohol/week: 0.0 standard drinks    Comment: rarely  . Drug use: No  . Sexual activity: Not on file

## 2020-01-01 ENCOUNTER — Ambulatory Visit
Admission: RE | Admit: 2020-01-01 | Discharge: 2020-01-01 | Disposition: A | Discharge status: Home / Self Care | Payer: Medicare Other | Source: Ambulatory Visit | Attending: Specialist | Admitting: Specialist

## 2020-01-01 ENCOUNTER — Other Ambulatory Visit: Payer: Self-pay

## 2020-01-01 DIAGNOSIS — R1900 Intra-abdominal and pelvic swelling, mass and lump, unspecified site: Secondary | ICD-10-CM

## 2020-01-01 MED ORDER — IOPAMIDOL (ISOVUE-300) INJECTION 61%
100.0000 mL | Freq: Once | INTRAVENOUS | Status: AC | PRN
  Administered 2020-01-01: 12:00:00 100 mL via INTRAVENOUS

## 2020-01-03 ENCOUNTER — Other Ambulatory Visit: Payer: Self-pay

## 2020-01-03 ENCOUNTER — Ambulatory Visit: Payer: Medicare Other | Admitting: Specialist

## 2020-01-03 ENCOUNTER — Encounter: Payer: Self-pay | Admitting: Specialist

## 2020-01-03 VITALS — BP 134/89 | HR 79 | Ht 71.0 in | Wt 180.0 lb

## 2020-01-03 DIAGNOSIS — M5135 Other intervertebral disc degeneration, thoracolumbar region: Secondary | ICD-10-CM

## 2020-01-03 DIAGNOSIS — M47815 Spondylosis without myelopathy or radiculopathy, thoracolumbar region: Secondary | ICD-10-CM | POA: Diagnosis not present

## 2020-01-03 DIAGNOSIS — M5134 Other intervertebral disc degeneration, thoracic region: Secondary | ICD-10-CM

## 2020-01-03 DIAGNOSIS — M533 Sacrococcygeal disorders, not elsewhere classified: Secondary | ICD-10-CM

## 2020-01-03 DIAGNOSIS — G8929 Other chronic pain: Secondary | ICD-10-CM

## 2020-01-03 DIAGNOSIS — M4325 Fusion of spine, thoracolumbar region: Secondary | ICD-10-CM | POA: Diagnosis not present

## 2020-01-03 NOTE — Patient Instructions (Addendum)
  Plan: Avoid frequent bending and stooping  No lifting greater than 10 lbs. May use ice or moist heat for pain. Weight loss is of benefit.  Exercise is important to improve your indurance and does allow people to function better inspite of back pain. Abdomenal CT with contrast and a marker to assess left posterior abdomen and lumbar area for hernia or muscle atrophy. If it is okay to try Hemp CBD. Hemp CBD capsules, amazon.com 5,000-7,000 mg per bottle, 60 capsules per bottle, take one capsule twice a day. MRI of the thoracic spine to assess for thoracic adjacent level stenosis or nerve compression as a cause of abdomenal discomfort.

## 2020-01-03 NOTE — Progress Notes (Signed)
Office Visit Note   Patient: Jeremiah Anderson           Date of Birth: 11/24/1965           MRN: [id number] Visit Date: 01/03/2020              Requested by: Sherald Hess., MD [place],  [place] [address] PCP: Sherald Hess., MD   Assessment & Plan: Visit Diagnoses:  1. Spondylosis of thoracolumbar spine   2. DDD (degenerative disc disease), thoracolumbar   3. Chronic right SI joint pain   4. Fusion of spine of thoracolumbar region   5. DDD (degenerative disc disease), thoracic     Plan:  Plan: Avoid frequent bending and stooping  No lifting greater than 10 lbs. May use ice or moist heat for pain. Weight loss is of benefit.  Exercise is important to improve your indurance and does allow people to function better inspite of back pain. Abdomenal CT with contrast and a marker to assess left posterior abdomen and lumbar area for hernia or muscle atrophy. If it is okay to try Hemp CBD. Hemp CBD capsules, amazon.com 5,000-7,000 mg per bottle, 60 capsules per bottle, take one capsule twice a day. MRI of the thoracic spine to assess for thoracic adjacent level stenosis or nerve compression as a cause of abdomenal discomfort.   Follow-Up Instructions: No follow-ups on file.   Orders:  No orders of the defined types were placed in this encounter.  No orders of the defined types were placed in this encounter.     Procedures: No procedures performed   Clinical Data: No additional findings.   Subjective: Chief Complaint  Patient presents with  . Lower Back - Follow-up    54 year old year old male with history of L1 to sacrum fusion with hyplordotic lumbar curve and persistent pain in the left anterior inguinal area and occasional pain into the left anterior ilioinguinal area pain. No bowel or bladder difficulty. He is otherwise doing push ups and  CT of the abdomen done with contrast to evaluate the abdomenal wall and ensure no surgically  necessary solution.    Review of Systems  Constitutional: Negative.   HENT: Negative.   Eyes: Negative.   Respiratory: Negative.   Cardiovascular: Negative.   Gastrointestinal: Negative.  Negative for abdominal distention and abdominal pain.  Endocrine: Negative.   Genitourinary: Negative.   Musculoskeletal: Negative.   Skin: Negative.   Allergic/Immunologic: Negative.   Neurological: Negative.   Hematological: Negative.   Psychiatric/Behavioral: Negative.      Objective: Vital Signs: BP 134/89 (BP Location: Left Arm, Patient Position: Sitting)   Pulse 79   Ht 5\' 11"  (1.803 m)   Wt 180 lb (81.6 kg)   BMI 25.10 kg/m   Physical Exam Constitutional:      Appearance: He is well-developed.  HENT:     Head: Normocephalic and atraumatic.  Eyes:     Pupils: Pupils are equal, round, and reactive to light.  Pulmonary:     Effort: Pulmonary effort is normal.     Breath sounds: Normal breath sounds.  Abdominal:     General: Bowel sounds are normal.     Palpations: Abdomen is soft.  Musculoskeletal:     Cervical back: Normal range of motion and neck supple.     Lumbar back: Negative right straight leg raise test and negative left straight leg raise test.  Skin:    General: Skin is  warm and dry.  Neurological:     Mental Status: He is alert and oriented to person, place, and time.  Psychiatric:        Behavior: Behavior normal.        Thought Content: Thought content normal.        Judgment: Judgment normal.     Back Exam   Tenderness  The patient is experiencing tenderness in the thoracic and sacroiliac.  Range of Motion  Extension: abnormal  Flexion: normal  Lateral bend right: abnormal  Lateral bend left: abnormal  Rotation right: abnormal  Rotation left: abnormal   Muscle Strength  Right Quadriceps:  5/5  Left Quadriceps:  5/5  Right Hamstrings:  5/5   Tests  Straight leg raise right: negative Straight leg raise left: negative  Reflexes  Patellar:  2/4 Achilles: 2/4 Babinski's sign: normal   Other  Toe walk: normal Heel walk: normal Erythema: no back redness Scars: absent      Specialty Comments:  No specialty comments available.  Imaging: No results found.   PMFS History: Patient Active Problem List   Diagnosis Date Noted  . Neck pain 12/17/2017  . Medicare annual wellness visit, subsequent 01/27/2017  . Abscess of nasal cavity 06/14/2016  . Right flank mass 06/14/2016  . Tobacco use disorder 06/14/2016  . Overweight (BMI 25.0-29.9) 06/14/2016  . Sinusitis, chronic 08/12/2015  . Arthralgia 07/06/2015  . Chronic back pain 06/10/2015  . Multiple duodenal ulcers 06/10/2015  . Hyperlipidemia 05/07/2015  . Migraines 04/21/2015  . Essential hypertension 02/16/2015   Past Medical History:  Diagnosis Date  . Arthritis   . GERD (gastroesophageal reflux disease)   . Hyperlipidemia   . Hypertension   . Migraines   . Wears glasses     Family History  Problem Relation Age of Onset  . Arthritis Mother   . Hyperlipidemia Mother   . Hypertension Father   . Colon cancer Maternal Grandmother   . Hypertension Sister   . Hypertension Paternal Grandmother   . Heart attack Paternal Grandfather     Past Surgical History:  Procedure Laterality Date  . APPENDECTOMY  1980s   post op bleeding sent him back for ex lap and hemostatic intervention.   Marland Kitchen BACK SURGERY     8 surgeries total - fused from L1-S1  . COLONOSCOPY     Age 87. For bleeding  . ESOPHAGOGASTRODUODENOSCOPY N/A 06/10/2015   Procedure: ESOPHAGOGASTRODUODENOSCOPY (EGD);  Surgeon: Lafayette Dragon, MD;  Location: [facility] ENDOSCOPY;  Service: Endoscopy;  Laterality: N/A;  . ex lap with peri gastric vessel ligation  1990s   after retching, he rupture a blood vessel "outside" the stomach.  vessel was ligated per pt's descriiption.   Fatima Blank HERNIA REPAIR Right 10/21/2016   Procedure: OPEN FLANK HERNIA REPAIR WITH MESH;  Surgeon: Ralene Ok, MD;  Location: [place];  Service: General;  Laterality: Right;  . INSERTION OF MESH Right 10/21/2016   Procedure: INSERTION OF MESH;  Surgeon: Ralene Ok, MD;  Location: [place];  Service: General;  Laterality: Right;  . KNEE SURGERY     Multiple bilateral scopes  . RETINAL DETACHMENT SURGERY Right   . SHOULDER SURGERY     4x on the left - total shoulder replacement.   Marland Kitchen SHOULDER SURGERY Left    x2  . TOTAL SHOULDER ARTHROPLASTY Left    x2   Social History   Occupational History  . Occupation: Disability    Comment: Former Therapist, sports  Tobacco Use  .  Smoking status: Current Every Day Smoker    Years: 24.00    Types: Cigarettes  . Smokeless tobacco: Never Used  Substance and Sexual Activity  . Alcohol use: Yes    Alcohol/week: 0.0 standard drinks    Comment: rarely  . Drug use: No  . Sexual activity: Not on file

## 2020-01-09 ENCOUNTER — Ambulatory Visit: Payer: Medicare Other | Admitting: Specialist

## 2020-01-15 ENCOUNTER — Ambulatory Visit
Admission: RE | Admit: 2020-01-15 | Discharge: 2020-01-15 | Disposition: A | Discharge status: Home / Self Care | Payer: Medicare Other | Source: Ambulatory Visit | Attending: Specialist | Admitting: Specialist

## 2020-01-15 ENCOUNTER — Other Ambulatory Visit: Payer: Self-pay

## 2020-01-15 DIAGNOSIS — M47815 Spondylosis without myelopathy or radiculopathy, thoracolumbar region: Secondary | ICD-10-CM

## 2020-01-15 DIAGNOSIS — M5135 Other intervertebral disc degeneration, thoracolumbar region: Secondary | ICD-10-CM

## 2020-01-30 ENCOUNTER — Ambulatory Visit: Payer: Medicare Other | Admitting: Specialist

## 2020-02-19 ENCOUNTER — Encounter: Payer: Self-pay | Admitting: Specialist

## 2020-02-19 ENCOUNTER — Other Ambulatory Visit: Payer: Self-pay

## 2020-02-19 ENCOUNTER — Ambulatory Visit: Payer: Medicare Other | Admitting: Specialist

## 2020-02-19 VITALS — BP 161/92 | HR 89 | Ht 71.0 in | Wt 180.0 lb

## 2020-02-19 DIAGNOSIS — Z981 Arthrodesis status: Secondary | ICD-10-CM

## 2020-02-19 DIAGNOSIS — M533 Sacrococcygeal disorders, not elsewhere classified: Secondary | ICD-10-CM | POA: Diagnosis not present

## 2020-02-19 DIAGNOSIS — M4325 Fusion of spine, thoracolumbar region: Secondary | ICD-10-CM

## 2020-02-19 DIAGNOSIS — M47815 Spondylosis without myelopathy or radiculopathy, thoracolumbar region: Secondary | ICD-10-CM | POA: Diagnosis not present

## 2020-02-19 DIAGNOSIS — M5135 Other intervertebral disc degeneration, thoracolumbar region: Secondary | ICD-10-CM

## 2020-02-19 DIAGNOSIS — M5134 Other intervertebral disc degeneration, thoracic region: Secondary | ICD-10-CM

## 2020-02-19 DIAGNOSIS — G8929 Other chronic pain: Secondary | ICD-10-CM

## 2020-02-19 NOTE — Patient Instructions (Signed)
Avoid bending, stooping and avoid lifting weights greater than 10 lbs. Avoid prolong standing and walking. Avoid frequent bending and stooping  No lifting greater than 10 lbs. May use ice or moist heat for pain. Weight loss is of benefit. Handicap license is approved. Call us if the pain worsens and we would have either Dr. Hardin Negus or Dr. Romona Curls secretary/Assistant will call to arrange for epidural steroid injection. Hemp CBD capsules, amazon.com 5,000-7,000 mg per bottle, 60 capsules per bottle, take one capsule twice a day. The MRI suggests arthritis and degenerative disc disease above the fusion but no sign of significant cord compression that would result in spinal cord changes. An ESI is a consideration at the Thoracolumbar junction.  Follow-Up Instructions: No follow-ups on file.

## 2020-02-19 NOTE — Progress Notes (Signed)
Office Visit Note   Patient: Jeremiah Anderson           Date of Birth: 05-Aug-1966           MRN: [id number] Visit Date: 02/19/2020              Requested by: Sherald Hess., MD [place],  [place] [address] PCP: Sherald Hess., MD   Assessment & Plan: Visit Diagnoses:  1. DDD (degenerative disc disease), thoracolumbar   2. Chronic right SI joint pain   3. Fusion of spine of thoracolumbar region   4. Spondylosis of thoracolumbar spine   5. DDD (degenerative disc disease), thoracic   6. Status post lumbar spinal fusion     Plan: Avoid bending, stooping and avoid lifting weights greater than 10 lbs. Avoid prolong standing and walking. Avoid frequent bending and stooping  No lifting greater than 10 lbs. May use ice or moist heat for pain. Weight loss is of benefit. Handicap license is approved. Call us if the pain worsens and we would have either Dr. Hardin Negus or Dr. Romona Curls secretary/Assistant will call to arrange for epidural steroid injection. Hemp CBD capsules, amazon.[email or web address] 5,000-7,000 mg per bottle, 60 capsules per bottle, take one capsule twice a day. The MRI suggests arthritis and degenerative disc disease above the fusion but no sign of significant cord compression that would result in spinal cord changes. An ESI is a consideration at the Thoracolumbar junction.   Follow-Up Instructions: No follow-ups on file.   Orders:  No orders of the defined types were placed in this encounter.  No orders of the defined types were placed in this encounter.     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 Mid back pain extending around waists and into the anterior upper abdomen for 3-4 months.  EXAM: MRI THORACIC SPINE WITHOUT CONTRAST  TECHNIQUE: Multiplanar, multisequence MR imaging of the thoracic spine was performed. No intravenous contrast was  administered.  COMPARISON:  Prior radiograph from 03/15/2017.  FINDINGS: Alignment: Physiologic with preservation of the normal thoracic kyphosis. No listhesis.  Vertebrae: Vertebral body height maintained without evidence for acute or chronic fracture. Bone marrow signal intensity within normal limits. No discrete or worrisome osseous lesions. No abnormal marrow edema. Susceptibility artifact related to lumbar fusion partially visualized.  Cord:  Signal intensity within the thoracic spinal cord is normal.  Paraspinal and other soft tissues: Paraspinous soft tissues within normal limits. Visualized visceral structures are normal.  Disc levels:  T1-2:  Unremarkable.  T2-3: Unremarkable.  T3-4:  Unremarkable.  T4-5: Small right paracentral disc protrusion indents the right ventral thecal sac with secondary mild flattening of the right hemi cord (series 8, image 11). No significant stenosis.  T5-6: Negative interspace. Mild posterior element hypertrophy. No stenosis.  T6-7: Broad-based posterior disc bulge flattens and partially effaces the ventral thecal sac. Minimal flattening of the ventral spinal cord without significant spinal stenosis. Mild facet hypertrophy. No significant foraminal narrowing.  T7-8: Diffuse disc bulge with superimposed shallow right paracentral disc protrusion (series 8, image 20). Flattening of the ventral thecal sac with minimal flattening of the ventral right hemicord. No cord signal changes or significant stenosis. Foramina remain patent.  T8-9: Mild diffuse disc bulge. Endplate Schmorl's node deformity. No stenosis.  T9-10: Diffuse disc bulge with disc desiccation. Endplate Schmorl's node deformity. Mild posterior element hypertrophy. Resultant mild spinal stenosis without cord deformity. Foramina remain patent.  T10-11: Diffuse disc bulge with disc desiccation and intervertebral disc space narrowing. Mild reactive  endplate changes. Posterior element hypertrophy. Resultant mild spinal stenosis without cord deformity. Foramina remain patent.  T11-12: Diffuse disc bulge with disc desiccation. Moderate facet hypertrophy. No significant spinal stenosis. Moderate left neural foraminal narrowing (series 5, image 10). No significant right foraminal stenosis.  T12-L1: Mild disc bulge. Right foraminal to extraforaminal disc protrusion (series 8, image 35). Moderate right worse than left facet hypertrophy. Resultant mild spinal stenosis. No cord impingement. Moderate right foraminal narrowing (series 5, image 3). No significant left foraminal encroachment.  IMPRESSION: 1. Multilevel thoracic spondylosis with resultant mild spinal stenosis at T9-10 through T12-L1 as above. 2. Right foraminal to extraforaminal disc protrusion at T12-L1 with resultant moderate right foraminal stenosis. 3. Disc bulging with facet hypertrophy at T11-12 with resultant moderate left foraminal stenosis.   Electronically Signed   By: Jeannine Boga M.D.   On: 01/15/2020 21:46     Subjective: Chief Complaint  Patient presents with  . Middle Back - Follow-up    54 year old male history of long thoracolumbar fusion for spinal stenosis and spondylosis with lumbar scoliosis. History of previous ESIs prior to surgery and had an episode to right leg shaking and a drop foot on the right side. The epidural helped the pain and then he began having increased pressure and weakness.He has some concern about an epidural due the relationship to the onset of right leg spasm and  Foot drop.    Review of Systems  Constitutional: Negative.  Negative for activity change, appetite change, chills, diaphoresis, fatigue, fever and unexpected weight change.  HENT: Negative.  Negative for congestion, dental problem, drooling, ear discharge, ear pain, facial swelling, hearing loss, mouth sores, nosebleeds, postnasal drip, rhinorrhea,  sinus pressure, sinus pain, sneezing, sore throat, tinnitus, trouble swallowing and voice change.   Eyes: Negative.  Negative for photophobia, pain, discharge, redness, itching and visual disturbance.  Respiratory: Negative.  Negative for apnea, cough, choking, chest tightness, shortness of breath, wheezing and stridor.   Cardiovascular: Negative.  Negative for chest pain, palpitations and leg swelling.  Gastrointestinal: Positive for abdominal pain. Negative for abdominal distention, anal bleeding, blood in stool, constipation, diarrhea, nausea, rectal pain and vomiting.  Endocrine: Negative.  Negative for cold intolerance, heat intolerance, polydipsia, polyphagia and polyuria.  Genitourinary: Negative.  Negative for difficulty urinating, dysuria, enuresis, flank pain, frequency, genital sores, hematuria and urgency.  Musculoskeletal: Positive for back pain. Negative for arthralgias, gait problem, joint swelling, myalgias, neck pain and neck stiffness.  Skin: Negative.  Negative for color change, pallor, rash and wound.  Allergic/Immunologic: Negative.  Negative for environmental allergies, food allergies and immunocompromised state.  Neurological: Negative.  Negative for dizziness, tremors, seizures, syncope, facial asymmetry, speech difficulty, weakness, light-headedness, numbness and headaches.  Hematological: Negative.  Negative for adenopathy. Does not bruise/bleed easily.  Psychiatric/Behavioral: Negative.  Negative for agitation, behavioral problems, confusion, decreased concentration, dysphoric mood, hallucinations, self-injury, sleep disturbance and suicidal ideas. The patient is not nervous/anxious and is not hyperactive.      Objective: Vital Signs: BP (!) 161/92 (BP Location: Left Arm, Patient Position: Sitting)   Pulse 89   Ht 5\' 11"  (1.803 m)   Wt 180 lb (81.6 kg)   BMI 25.10 kg/m   Physical Exam Constitutional:      Appearance: He is well-developed.  HENT:     Head:  Normocephalic and atraumatic.  Eyes:     Pupils: Pupils are equal, round, and  reactive to light.  Pulmonary:     Effort: Pulmonary effort is normal.     Breath sounds: Normal breath sounds.  Abdominal:     General: Bowel sounds are normal.     Palpations: Abdomen is soft.  Musculoskeletal:     Cervical back: Normal range of motion and neck supple.     Lumbar back: Negative right straight leg raise test and negative left straight leg raise test.  Skin:    General: Skin is warm and dry.  Neurological:     Mental Status: He is alert and oriented to person, place, and time.  Psychiatric:        Behavior: Behavior normal.        Thought Content: Thought content normal.        Judgment: Judgment normal.     Back Exam   Tenderness  The patient is experiencing tenderness in the lumbar.  Range of Motion  Extension: abnormal  Flexion: normal  Lateral bend right: abnormal  Lateral bend left: abnormal  Rotation right: abnormal  Rotation left: abnormal   Muscle Strength  Right Quadriceps:  5/5  Left Quadriceps:  5/5  Right Hamstrings:  5/5  Left Hamstrings:  5/5   Tests  Straight leg raise right: negative Straight leg raise left: negative  Reflexes  Patellar: 2/4 Achilles: 2/4 Babinski's sign: normal   Other  Toe walk: normal Heel walk: normal Sensation: normal Gait: normal  Erythema: no back redness Scars: present  Comments:  Motor is intact bilaterally.      Specialty Comments:  No specialty comments available.  Imaging: No results found.   PMFS History: Patient Active Problem List   Diagnosis Date Noted  . Neck pain 12/17/2017  . Medicare annual wellness visit, subsequent 01/27/2017  . Abscess of nasal cavity 06/14/2016  . Right flank mass 06/14/2016  . Tobacco use disorder 06/14/2016  . Overweight (BMI 25.0-29.9) 06/14/2016  . Sinusitis, chronic 08/12/2015  . Arthralgia 07/06/2015  . Chronic back pain 06/10/2015  . Multiple duodenal ulcers  06/10/2015  . Hyperlipidemia 05/07/2015  . Migraines 04/21/2015  . Essential hypertension 02/16/2015   Past Medical History:  Diagnosis Date  . Arthritis   . GERD (gastroesophageal reflux disease)   . Hyperlipidemia   . Hypertension   . Migraines   . Wears glasses     Family History  Problem Relation Age of Onset  . Arthritis Mother   . Hyperlipidemia Mother   . Hypertension Father   . Colon cancer Maternal Grandmother   . Hypertension Sister   . Hypertension Paternal Grandmother   . Heart attack Paternal Grandfather     Past Surgical History:  Procedure Laterality Date  . APPENDECTOMY  1980s   post op bleeding sent him back for ex lap and hemostatic intervention.   Marland Kitchen BACK SURGERY     8 surgeries total - fused from L1-S1  . COLONOSCOPY     Age 58. For bleeding  . ESOPHAGOGASTRODUODENOSCOPY N/A 06/10/2015   Procedure: ESOPHAGOGASTRODUODENOSCOPY (EGD);  Surgeon: Lafayette Dragon, MD;  Location: [facility] ENDOSCOPY;  Service: Endoscopy;  Laterality: N/A;  . ex lap with peri gastric vessel ligation  1990s   after retching, he rupture a blood vessel "outside" the stomach.  vessel was ligated per pt's descriiption.   Fatima Blank HERNIA REPAIR Right 10/21/2016   Procedure: OPEN FLANK HERNIA REPAIR WITH MESH;  Surgeon: Ralene Ok, MD;  Location: [place];  Service: General;  Laterality: Right;  . INSERTION OF MESH  Right 10/21/2016   Procedure: INSERTION OF MESH;  Surgeon: Ralene Ok, MD;  Location: [place];  Service: General;  Laterality: Right;  . KNEE SURGERY     Multiple bilateral scopes  . RETINAL DETACHMENT SURGERY Right   . SHOULDER SURGERY     4x on the left - total shoulder replacement.   Marland Kitchen SHOULDER SURGERY Left    x2  . TOTAL SHOULDER ARTHROPLASTY Left    x2   Social History   Occupational History  . Occupation: Disability    Comment: Former Therapist, sports  Tobacco Use  . Smoking status: Current Every Day Smoker    Years: 24.00    Types: Cigarettes  . Smokeless tobacco: Never  Used  Substance and Sexual Activity  . Alcohol use: Yes    Alcohol/week: 0.0 standard drinks    Comment: rarely  . Drug use: No  . Sexual activity: Not on file

## 2020-02-27 ENCOUNTER — Telehealth: Payer: Self-pay | Admitting: Family Medicine

## 2020-02-27 NOTE — Telephone Encounter (Signed)
Pt called in stating he left his current pcp due to the Dr. Not taking proper covid precautions and wanted to know if he could establish care with Dr. Junius Roads.   [phone]

## 2020-02-27 NOTE — Telephone Encounter (Signed)
Pls advise. Thanks.  

## 2020-02-28 NOTE — Telephone Encounter (Signed)
Yes, that would be fine.

## 2020-02-28 NOTE — Telephone Encounter (Signed)
I tried calling patient to advise he needed to get his records from PCP for Dr Junius Roads. No answer but left detailed VM stating he needed to call to get appt scheduled and also would need to have his records sent to Dr [facility] for review.

## 2020-03-23 ENCOUNTER — Ambulatory Visit: Payer: Medicare Other | Admitting: Family Medicine

## 2020-03-23 ENCOUNTER — Encounter: Payer: Self-pay | Admitting: Family Medicine

## 2020-03-23 ENCOUNTER — Other Ambulatory Visit: Payer: Self-pay

## 2020-03-23 VITALS — BP 140/88 | HR 84 | Ht 68.5 in | Wt 195.6 lb

## 2020-03-23 DIAGNOSIS — M545 Low back pain, unspecified: Secondary | ICD-10-CM

## 2020-03-23 DIAGNOSIS — G8929 Other chronic pain: Secondary | ICD-10-CM

## 2020-03-23 DIAGNOSIS — E782 Mixed hyperlipidemia: Secondary | ICD-10-CM

## 2020-03-23 DIAGNOSIS — R7989 Other specified abnormal findings of blood chemistry: Secondary | ICD-10-CM | POA: Diagnosis not present

## 2020-03-23 DIAGNOSIS — I10 Essential (primary) hypertension: Secondary | ICD-10-CM | POA: Diagnosis not present

## 2020-03-23 MED ORDER — AMOXICILLIN 500 MG PO TABS: 1000.0000 mg | ORAL_TABLET | Freq: Two times a day (BID) | ORAL | 3 refills | Status: DC

## 2020-03-23 NOTE — Progress Notes (Signed)
Office Visit Note   Patient: Jeremiah Anderson           Date of Birth: January 15, 1966           MRN: [id number] Visit Date: 03/23/2020 Requested by: Sherald Hess., MD [place],  [place] [address] PCP: Eunice Blase, MD  Subjective: Chief Complaint  Patient presents with  . establish primary care    HPI: He is here to establish care.  He was not comfortable with the COVID-19 protocol at his previous PCP office.  He has a history of chronic back pain status post 23 surgeries.  He is managed by pain clinic for this.  He has been followed for elevated liver enzymes, etiology uncertain.  The elevations have been borderline.  He had an ultrasound which was unremarkable.  He is asymptomatic from the liver enzymes standpoint.  He has hypercholesterolemia and recently was given Crestor 20 mg daily.  He has been having increased pain overall, but is not sure whether it is related to the statin.  His mother was not able to tolerate statins.  Patient does not have any history of heart disease.  He is status post left shoulder replacement.  In the past he was given antibiotics prior to dental procedures.  He wonders whether he needs 1 now.  He is getting ready to see his dentist soon.  He has a history of hypertension which has been well controlled.  He has vitamin D deficiency.  He is a former smoker.  He has migraine headaches and takes about 9 tablets of sumatriptan per month, sometimes 2 pills per headache.                ROS:   All other systems were reviewed and are negative.  Objective: Vital Signs: BP 140/88   Pulse 84   Ht 5' 8.5" (1.74 m)   Wt 195 lb 9.6 oz (88.7 kg)   BMI 29.31 kg/m   Physical Exam:  General:  Alert and oriented, in no acute distress. Pulm:  Breathing unlabored. Psy:  Normal mood, congruent affect. Skin:  No lesions seen. No jaundice. Neck: No thyromegaly or nodules.  No carotid bruits. CV: Regular rate and rhythm without murmurs, rubs, or  gallops.  No peripheral edema.  2+ radial and posterior tibial pulses. Lungs: Clear to auscultation throughout with no wheezing or areas of consolidation. Abd:  No HSM.   Imaging: No results found.  Assessment & Plan: 1.  History of elevated liver enzymes, mild. -Recheck in about 6 months.  He plans to come in for a complete physical exam point.  2.  Hypertension, well controlled.  3.  Chronic pain syndrome -Managed by pain clinic  4.  Hyperlipidemia -He will stop Crestor for a couple weeks.  If his pain improves, we will then resume it at a lower dosage, probably 5 mg daily. -Emphasized the importance of minimizing processed carbohydrates and sweets.  5.  Migraine headaches      Procedures: No procedures performed  No notes on file     PMFS History: Patient Active Problem List   Diagnosis Date Noted  . Fusion of spine of lumbar region 10/31/2019  . 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 Chronic, continuous use of opioids 07/17/2019  . Facet arthropathy, cervical 07/17/2019  . Neck pain 12/17/2017  . Patellar tendinitis of right knee 08/18/2017  . Medicare annual wellness visit, subsequent 01/27/2017  . Abscess of nasal cavity 06/14/2016  .  Right flank mass 06/14/2016  . Tobacco use disorder 06/14/2016  . Overweight (BMI 25.0-29.9) 06/14/2016  . Sinusitis, chronic 08/12/2015  . Arthralgia 07/06/2015  . Chronic back pain 06/10/2015  . Multiple duodenal ulcers 06/10/2015  . Hyperlipidemia 05/07/2015  . Migraines 04/21/2015  . Essential hypertension 02/16/2015  . Impingement syndrome of shoulder 08/07/2014  . 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 Arthritis   . GERD (gastroesophageal reflux disease)   . Hyperlipidemia   . Hypertension   . Migraines   . Wears glasses     Family History  Problem Relation Age of Onset  . Arthritis Mother   . Hyperlipidemia Mother   . Hypertension Father   . Colon cancer  Maternal Grandmother   . Hypertension Sister   . Hypertension Paternal Grandmother   . Heart attack Paternal Grandfather     Past Surgical History:  Procedure Laterality Date  . APPENDECTOMY  1980s   post op bleeding sent him back for ex lap and hemostatic intervention.   Marland Kitchen BACK SURGERY     8 surgeries total - fused from L1-S1  . COLONOSCOPY     Age 60. For bleeding  . ESOPHAGOGASTRODUODENOSCOPY N/A 06/10/2015   Procedure: ESOPHAGOGASTRODUODENOSCOPY (EGD);  Surgeon: Lafayette Dragon, MD;  Location: [facility] ENDOSCOPY;  Service: Endoscopy;  Laterality: N/A;  . ex lap with peri gastric vessel ligation  1990s   after retching, he rupture a blood vessel "outside" the stomach.  vessel was ligated per pt's descriiption.   Fatima Blank HERNIA REPAIR Right 10/21/2016   Procedure: OPEN FLANK HERNIA REPAIR WITH MESH;  Surgeon: Ralene Ok, MD;  Location: [place];  Service: General;  Laterality: Right;  . INSERTION OF MESH Right 10/21/2016   Procedure: INSERTION OF MESH;  Surgeon: Ralene Ok, MD;  Location: [place];  Service: General;  Laterality: Right;  . KNEE SURGERY     Multiple bilateral scopes  . RETINAL DETACHMENT SURGERY Right   . SHOULDER SURGERY     4x on the left - total shoulder replacement.   Marland Kitchen SHOULDER SURGERY Left    x2  . TOTAL SHOULDER ARTHROPLASTY Left    x2   Social History   Occupational History  . Occupation: Disability    Comment: Former Therapist, sports  Tobacco Use  . Smoking status: Current Every Day Smoker    Years: 24.00    Types: Cigarettes  . Smokeless tobacco: Never Used  Substance and Sexual Activity  . Alcohol use: Yes    Alcohol/week: 0.0 standard drinks    Comment: rarely  . Drug use: No  . Sexual activity: Not on file

## 2020-04-08 DIAGNOSIS — G894 Chronic pain syndrome: Secondary | ICD-10-CM | POA: Diagnosis not present

## 2020-04-08 DIAGNOSIS — M62830 Muscle spasm of back: Secondary | ICD-10-CM | POA: Diagnosis not present

## 2020-04-08 DIAGNOSIS — Z79891 Long term (current) use of opiate analgesic: Secondary | ICD-10-CM | POA: Diagnosis not present

## 2020-04-08 DIAGNOSIS — M47818 Spondylosis without myelopathy or radiculopathy, sacral and sacrococcygeal region: Secondary | ICD-10-CM | POA: Diagnosis not present

## 2020-04-20 ENCOUNTER — Encounter: Payer: Self-pay | Admitting: Family Medicine

## 2020-04-21 MED ORDER — ONDANSETRON HCL 4 MG PO TABS: ORAL_TABLET | ORAL | 3 refills | Status: DC

## 2020-04-21 MED ORDER — ROSUVASTATIN CALCIUM 20 MG PO TABS: 20.0000 mg | ORAL_TABLET | Freq: Every day | ORAL | 3 refills | Status: DC

## 2020-04-21 MED ORDER — SUMATRIPTAN SUCCINATE 100 MG PO TABS: ORAL_TABLET | ORAL | 11 refills | Status: DC

## 2020-05-06 DIAGNOSIS — M47818 Spondylosis without myelopathy or radiculopathy, sacral and sacrococcygeal region: Secondary | ICD-10-CM | POA: Diagnosis not present

## 2020-05-06 DIAGNOSIS — Z79891 Long term (current) use of opiate analgesic: Secondary | ICD-10-CM | POA: Diagnosis not present

## 2020-05-06 DIAGNOSIS — M62830 Muscle spasm of back: Secondary | ICD-10-CM | POA: Diagnosis not present

## 2020-05-06 DIAGNOSIS — G894 Chronic pain syndrome: Secondary | ICD-10-CM | POA: Diagnosis not present

## 2020-05-20 ENCOUNTER — Ambulatory Visit: Payer: Medicare Other | Admitting: Specialist

## 2020-06-03 DIAGNOSIS — M62830 Muscle spasm of back: Secondary | ICD-10-CM | POA: Diagnosis not present

## 2020-06-03 DIAGNOSIS — M79671 Pain in right foot: Secondary | ICD-10-CM | POA: Diagnosis not present

## 2020-06-03 DIAGNOSIS — G894 Chronic pain syndrome: Secondary | ICD-10-CM | POA: Diagnosis not present

## 2020-06-03 DIAGNOSIS — M47818 Spondylosis without myelopathy or radiculopathy, sacral and sacrococcygeal region: Secondary | ICD-10-CM | POA: Diagnosis not present

## 2020-06-03 DIAGNOSIS — M7741 Metatarsalgia, right foot: Secondary | ICD-10-CM | POA: Diagnosis not present

## 2020-06-03 DIAGNOSIS — Z79891 Long term (current) use of opiate analgesic: Secondary | ICD-10-CM | POA: Diagnosis not present

## 2020-06-25 ENCOUNTER — Ambulatory Visit: Payer: Medicare Other | Admitting: Specialist

## 2020-07-01 DIAGNOSIS — M62830 Muscle spasm of back: Secondary | ICD-10-CM | POA: Diagnosis not present

## 2020-07-01 DIAGNOSIS — M79671 Pain in right foot: Secondary | ICD-10-CM | POA: Diagnosis not present

## 2020-07-01 DIAGNOSIS — M47818 Spondylosis without myelopathy or radiculopathy, sacral and sacrococcygeal region: Secondary | ICD-10-CM | POA: Diagnosis not present

## 2020-07-01 DIAGNOSIS — M7741 Metatarsalgia, right foot: Secondary | ICD-10-CM | POA: Diagnosis not present

## 2020-07-05 ENCOUNTER — Encounter: Payer: Self-pay | Admitting: Family Medicine

## 2020-07-05 DIAGNOSIS — I10 Essential (primary) hypertension: Secondary | ICD-10-CM

## 2020-07-06 MED ORDER — AMLODIPINE BESYLATE 10 MG PO TABS: 10.0000 mg | ORAL_TABLET | Freq: Every day | ORAL | 3 refills | Status: DC

## 2020-07-21 DIAGNOSIS — M47818 Spondylosis without myelopathy or radiculopathy, sacral and sacrococcygeal region: Secondary | ICD-10-CM | POA: Diagnosis not present

## 2020-07-21 DIAGNOSIS — M62830 Muscle spasm of back: Secondary | ICD-10-CM | POA: Diagnosis not present

## 2020-07-21 DIAGNOSIS — M7741 Metatarsalgia, right foot: Secondary | ICD-10-CM | POA: Diagnosis not present

## 2020-07-21 DIAGNOSIS — M79671 Pain in right foot: Secondary | ICD-10-CM | POA: Diagnosis not present

## 2020-07-31 ENCOUNTER — Ambulatory Visit: Payer: Medicare Other | Admitting: Specialist

## 2020-07-31 ENCOUNTER — Encounter: Payer: Self-pay | Admitting: Specialist

## 2020-07-31 ENCOUNTER — Other Ambulatory Visit: Payer: Self-pay | Admitting: Family Medicine

## 2020-07-31 VITALS — BP 139/91 | HR 73 | Ht 68.5 in | Wt 193.6 lb

## 2020-07-31 DIAGNOSIS — D3610 Benign neoplasm of peripheral nerves and autonomic nervous system, unspecified: Secondary | ICD-10-CM

## 2020-07-31 DIAGNOSIS — M5134 Other intervertebral disc degeneration, thoracic region: Secondary | ICD-10-CM | POA: Diagnosis not present

## 2020-07-31 DIAGNOSIS — M4325 Fusion of spine, thoracolumbar region: Secondary | ICD-10-CM | POA: Diagnosis not present

## 2020-07-31 DIAGNOSIS — G8929 Other chronic pain: Secondary | ICD-10-CM

## 2020-07-31 DIAGNOSIS — M533 Sacrococcygeal disorders, not elsewhere classified: Secondary | ICD-10-CM

## 2020-07-31 DIAGNOSIS — M898X8 Other specified disorders of bone, other site: Secondary | ICD-10-CM

## 2020-07-31 DIAGNOSIS — Z981 Arthrodesis status: Secondary | ICD-10-CM

## 2020-07-31 NOTE — Patient Instructions (Signed)
Plan: Avoid bending, stooping and avoid lifting weights greater than 10 lbs. Avoid prolong standing and walking. Avoid frequent bending and stooping  No lifting greater than 10 lbs. May use ice or moist heat for pain. Weight loss is of benefit. Tenderness over the left iliac crest suggests pain that may be related to abdomenal muscle wall weakness or due to scar in the area of  The cluneal nerves that can occur with removal of bone off the left pelvis for bone graft to the spine, this can scar and cause chronic  Pain in the area of the left iliac crest, cluneal neuromas or cluneal nerve entrapment, soft tissue mobilization might help.  Handicap license is approved. Physical therapy for left upper buttock and iliac crest soft tissue mobilization 

## 2020-07-31 NOTE — Progress Notes (Signed)
Office Visit Note   Patient: Jeremiah Anderson           Date of Birth: 05-10-1966           MRN: [id number] Visit Date: 07/31/2020              Requested by: Sherald Hess., MD [place],  [place] [address] PCP: Eunice Blase, MD   Assessment & Plan: Visit Diagnoses:  1. Chronic right SI joint pain   2. Fusion of spine of thoracolumbar region   3. DDD (degenerative disc disease), thoracic   4. Iliac crest bone pain   5. Neuroma   6. Status post lumbar spinal fusion     Plan: Avoid bending, stooping and avoid lifting weights greater than 10 lbs. Avoid prolong standing and walking. Avoid frequent bending and stooping  No lifting greater than 10 lbs. May use ice or moist heat for pain. Weight loss is of benefit. Tenderness over the left iliac crest suggests pain that may be related to abdomenal muscle wall weakness or due to scar in the area of  The cluneal nerves that can occur with removal of bone off the left pelvis for bone graft to the spine, this can scar and cause chronic  Pain in the area of the left iliac crest, cluneal neuromas or cluneal nerve entrapment, soft tissue mobilization might help.  Handicap license is approved. Physical therapy for left upper buttock and iliac crest soft tissue mobilization  Follow-Up Instructions: No follow-ups on file.   Orders:  No orders of the defined types were placed in this encounter.  No orders of the defined types were placed in this encounter.     Procedures: No procedures performed   Clinical Data: No additional findings.   Subjective: Chief Complaint  Patient presents with  . Spine - Follow-up    HPI  Review of Systems   Objective: Vital Signs: BP (!) 139/91 (BP Location: Left Arm, Patient Position: Sitting)   Pulse 73   Ht 5' 8.5" (1.74 m)   Wt 193 lb 9.6 oz (87.8 kg)   BMI 29.01 kg/m   Physical Exam  Ortho Exam  Specialty Comments:  No specialty comments  available.  Imaging: No results found.   PMFS History: Patient Active Problem List   Diagnosis Date Noted  . Elevated LFTs 03/23/2020  . Fusion of spine of lumbar region 10/31/2019  . 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 Chronic, continuous use of opioids 07/17/2019  . Facet arthropathy, cervical 07/17/2019  . Neck pain 12/17/2017  . Patellar tendinitis of right knee 08/18/2017  . Medicare annual wellness visit, subsequent 01/27/2017  . Abscess of nasal cavity 06/14/2016  . Right flank mass 06/14/2016  . Tobacco use disorder 06/14/2016  . Overweight (BMI 25.0-29.9) 06/14/2016  . Sinusitis, chronic 08/12/2015  . Arthralgia 07/06/2015  . Chronic back pain 06/10/2015  . Multiple duodenal ulcers 06/10/2015  . Hyperlipidemia 05/07/2015  . Migraines 04/21/2015  . Essential hypertension 02/16/2015  . Impingement syndrome of shoulder 08/07/2014  . 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 Arthritis   . GERD (gastroesophageal reflux disease)   . Hyperlipidemia   . Hypertension   . Migraines   . Wears glasses     Family History  Problem Relation Age of Onset  . Arthritis Mother   . Hyperlipidemia Mother   . Hypertension Father   . Colon cancer Maternal Grandmother   . Hypertension  Sister   . Hypertension Paternal Grandmother   . Heart attack Paternal Grandfather     Past Surgical History:  Procedure Laterality Date  . APPENDECTOMY  1980s   post op bleeding sent him back for ex lap and hemostatic intervention.   Marland Kitchen BACK SURGERY     8 surgeries total - fused from L1-S1  . COLONOSCOPY     Age 46. For bleeding  . ESOPHAGOGASTRODUODENOSCOPY N/A 06/10/2015   Procedure: ESOPHAGOGASTRODUODENOSCOPY (EGD);  Surgeon: Lafayette Dragon, MD;  Location: [facility] ENDOSCOPY;  Service: Endoscopy;  Laterality: N/A;  . ex lap with peri gastric vessel ligation  1990s   after retching, he rupture a blood vessel "outside" the stomach.  vessel was ligated  per pt's descriiption.   Fatima Blank HERNIA REPAIR Right 10/21/2016   Procedure: OPEN FLANK HERNIA REPAIR WITH MESH;  Surgeon: Ralene Ok, MD;  Location: [place];  Service: General;  Laterality: Right;  . INSERTION OF MESH Right 10/21/2016   Procedure: INSERTION OF MESH;  Surgeon: Ralene Ok, MD;  Location: [place];  Service: General;  Laterality: Right;  . KNEE SURGERY     Multiple bilateral scopes  . RETINAL DETACHMENT SURGERY Right   . SHOULDER SURGERY     4x on the left - total shoulder replacement.   Marland Kitchen SHOULDER SURGERY Left    x2  . TOTAL SHOULDER ARTHROPLASTY Left    x2   Social History   Occupational History  . Occupation: Disability    Comment: Former Therapist, sports  Tobacco Use  . Smoking status: Current Every Day Smoker    Years: 24.00    Types: Cigarettes  . Smokeless tobacco: Never Used  Vaping Use  . Vaping Use: Never used  Substance and Sexual Activity  . Alcohol use: Yes    Alcohol/week: 0.0 standard drinks    Comment: rarely  . Drug use: No  . Sexual activity: Not on file

## 2020-08-04 DIAGNOSIS — D1801 Hemangioma of skin and subcutaneous tissue: Secondary | ICD-10-CM | POA: Diagnosis not present

## 2020-08-04 DIAGNOSIS — D485 Neoplasm of uncertain behavior of skin: Secondary | ICD-10-CM | POA: Diagnosis not present

## 2020-08-04 DIAGNOSIS — L812 Freckles: Secondary | ICD-10-CM | POA: Diagnosis not present

## 2020-08-04 DIAGNOSIS — L439 Lichen planus, unspecified: Secondary | ICD-10-CM | POA: Diagnosis not present

## 2020-08-04 DIAGNOSIS — L110 Acquired keratosis follicularis: Secondary | ICD-10-CM | POA: Diagnosis not present

## 2020-08-04 DIAGNOSIS — L819 Disorder of pigmentation, unspecified: Secondary | ICD-10-CM | POA: Diagnosis not present

## 2020-08-05 DIAGNOSIS — G5761 Lesion of plantar nerve, right lower limb: Secondary | ICD-10-CM | POA: Diagnosis not present

## 2020-08-13 DIAGNOSIS — M545 Low back pain: Secondary | ICD-10-CM | POA: Diagnosis not present

## 2020-08-13 DIAGNOSIS — R531 Weakness: Secondary | ICD-10-CM | POA: Diagnosis not present

## 2020-08-13 DIAGNOSIS — M546 Pain in thoracic spine: Secondary | ICD-10-CM | POA: Diagnosis not present

## 2020-08-13 DIAGNOSIS — M5136 Other intervertebral disc degeneration, lumbar region: Secondary | ICD-10-CM | POA: Diagnosis not present

## 2020-08-19 DIAGNOSIS — M7741 Metatarsalgia, right foot: Secondary | ICD-10-CM | POA: Diagnosis not present

## 2020-08-19 DIAGNOSIS — M62830 Muscle spasm of back: Secondary | ICD-10-CM | POA: Diagnosis not present

## 2020-08-19 DIAGNOSIS — M79671 Pain in right foot: Secondary | ICD-10-CM | POA: Diagnosis not present

## 2020-08-19 DIAGNOSIS — M47818 Spondylosis without myelopathy or radiculopathy, sacral and sacrococcygeal region: Secondary | ICD-10-CM | POA: Diagnosis not present

## 2020-09-01 DIAGNOSIS — G5761 Lesion of plantar nerve, right lower limb: Secondary | ICD-10-CM | POA: Diagnosis not present

## 2020-09-10 ENCOUNTER — Ambulatory Visit: Payer: Medicare Other | Admitting: Specialist

## 2020-09-16 DIAGNOSIS — M47818 Spondylosis without myelopathy or radiculopathy, sacral and sacrococcygeal region: Secondary | ICD-10-CM | POA: Diagnosis not present

## 2020-09-16 DIAGNOSIS — M79671 Pain in right foot: Secondary | ICD-10-CM | POA: Diagnosis not present

## 2020-09-16 DIAGNOSIS — M62830 Muscle spasm of back: Secondary | ICD-10-CM | POA: Diagnosis not present

## 2020-09-16 DIAGNOSIS — M7741 Metatarsalgia, right foot: Secondary | ICD-10-CM | POA: Diagnosis not present

## 2020-09-17 DIAGNOSIS — M4326 Fusion of spine, lumbar region: Secondary | ICD-10-CM | POA: Diagnosis not present

## 2020-09-17 DIAGNOSIS — M4030 Flatback syndrome, site unspecified: Secondary | ICD-10-CM | POA: Diagnosis not present

## 2020-09-17 DIAGNOSIS — M4302 Spondylolysis, cervical region: Secondary | ICD-10-CM | POA: Diagnosis not present

## 2020-09-17 DIAGNOSIS — M438X9 Other specified deforming dorsopathies, site unspecified: Secondary | ICD-10-CM | POA: Diagnosis not present

## 2020-09-21 DIAGNOSIS — H31093 Other chorioretinal scars, bilateral: Secondary | ICD-10-CM | POA: Diagnosis not present

## 2020-09-21 DIAGNOSIS — H35372 Puckering of macula, left eye: Secondary | ICD-10-CM | POA: Diagnosis not present

## 2020-09-21 DIAGNOSIS — H43393 Other vitreous opacities, bilateral: Secondary | ICD-10-CM | POA: Diagnosis not present

## 2020-09-21 DIAGNOSIS — H43813 Vitreous degeneration, bilateral: Secondary | ICD-10-CM | POA: Diagnosis not present

## 2020-09-23 ENCOUNTER — Ambulatory Visit: Payer: Medicare Other | Admitting: Family Medicine

## 2020-09-28 DIAGNOSIS — G5761 Lesion of plantar nerve, right lower limb: Secondary | ICD-10-CM | POA: Diagnosis not present

## 2020-10-02 ENCOUNTER — Encounter: Payer: Self-pay | Admitting: Specialist

## 2020-10-02 ENCOUNTER — Other Ambulatory Visit: Payer: Self-pay

## 2020-10-02 ENCOUNTER — Ambulatory Visit: Payer: Medicare Other | Admitting: Specialist

## 2020-10-02 VITALS — BP 145/67 | HR 76 | Ht 68.5 in | Wt 194.0 lb

## 2020-10-02 DIAGNOSIS — E782 Mixed hyperlipidemia: Secondary | ICD-10-CM

## 2020-10-02 DIAGNOSIS — M533 Sacrococcygeal disorders, not elsewhere classified: Secondary | ICD-10-CM

## 2020-10-02 DIAGNOSIS — M545 Low back pain, unspecified: Secondary | ICD-10-CM

## 2020-10-02 DIAGNOSIS — G8929 Other chronic pain: Secondary | ICD-10-CM

## 2020-10-02 DIAGNOSIS — D3610 Benign neoplasm of peripheral nerves and autonomic nervous system, unspecified: Secondary | ICD-10-CM

## 2020-10-02 DIAGNOSIS — M4325 Fusion of spine, thoracolumbar region: Secondary | ICD-10-CM

## 2020-10-02 DIAGNOSIS — M898X8 Other specified disorders of bone, other site: Secondary | ICD-10-CM | POA: Diagnosis not present

## 2020-10-02 DIAGNOSIS — I10 Essential (primary) hypertension: Secondary | ICD-10-CM

## 2020-10-02 DIAGNOSIS — M5134 Other intervertebral disc degeneration, thoracic region: Secondary | ICD-10-CM

## 2020-10-02 NOTE — Patient Instructions (Signed)
Plan: Avoid bending, stooping and avoid lifting weights greater than 10 lbs. Avoid prolong standing and walking. Avoid frequent bending and stooping  No lifting greater than 10 lbs. May use ice or moist heat for pain. Weight loss is of benefit. Tenderness over the left iliac crest suggests pain that may be related to abdomenal muscle wall weakness or due to scar in the area of  The cluneal nerves that can occur with removal of bone off the left pelvis for bone graft to the spine, this can scar and cause chronic  Pain in the area of the left iliac crest, cluneal neuromas or cluneal nerve entrapment, soft tissue mobilization might help.  Handicap license is approved. Physical therapy for left upper buttock and iliac crest soft tissue mobilization

## 2020-10-02 NOTE — Progress Notes (Signed)
Office Visit Note   Patient: Jeremiah Anderson           Date of Birth: 07/16/1966           MRN: [id number] Visit Date: 10/02/2020              Requested by: Eunice Blase, MD [address] [place],  [place] [address] PCP: Eunice Blase, MD   Assessment & Plan: Visit Diagnoses:  1. DDD (degenerative disc disease), thoracic   2. Fusion of spine of thoracolumbar region   3. Chronic right SI joint pain   4. Iliac crest bone pain   5. Essential hypertension   6. Mixed hyperlipidemia   7. Chronic low back pain, unspecified back pain laterality, unspecified whether sciatica present   8. Neuroma     Plan: Avoid bending, stooping and avoid lifting weights greater than 10 lbs. Avoid prolong standing and walking. Avoid frequent bending and stooping  No lifting greater than 10 lbs. May use ice or moist heat for pain. Weight loss is of benefit. Tenderness over the left iliac crest suggests pain that may be related to abdomenal muscle wall weakness or due to scar in the area of  The cluneal nerves that can occur with removal of bone off the left pelvis for bone graft to the spine, this can scar and cause chronic  Pain in the area of the left iliac crest, cluneal neuromas or cluneal nerve entrapment, soft tissue mobilization might help.  Handicap license is approved. Physical therapy for left upper buttock and iliac crest soft tissue mobilization  Follow-Up Instructions: No follow-ups on file.   Orders:  No orders of the defined types were placed in this encounter.  No orders of the defined types were placed in this encounter  Follow-Up Instructions: Return in about 6 months (around 04/01/2021).   Orders:  No orders of the defined types were placed in this encounter.  No orders of the defined types were placed in this encounter.     Procedures: No procedures performed   Clinical Data: No additional findings.   Subjective: Chief Complaint  Patient presents with  .  Spine - Follow-up    HPI  Review of Systems  Constitutional: Negative.   HENT: Negative.   Eyes: Negative.   Respiratory: Negative.   Cardiovascular: Negative.   Gastrointestinal: Negative.   Endocrine: Negative.   Genitourinary: Negative.   Musculoskeletal: Negative.   Skin: Negative.   Allergic/Immunologic: Negative.   Neurological: Negative.   Hematological: Negative.   Psychiatric/Behavioral: Negative.      Objective: Vital Signs: BP (!) 145/67 (BP Location: Left Arm, Patient Position: Sitting)   Pulse 76   Ht 5' 8.5" (1.74 m)   Wt 194 lb (88 kg)   BMI 29.07 kg/m   Physical Exam Constitutional:      Appearance: He is well-developed.  HENT:     Head: Normocephalic and atraumatic.  Eyes:     Pupils: Pupils are equal, round, and reactive to light.  Pulmonary:     Effort: Pulmonary effort is normal.     Breath sounds: Normal breath sounds.  Abdominal:     General: Bowel sounds are normal.     Palpations: Abdomen is soft.  Musculoskeletal:        General: Normal range of motion.     Cervical back: Normal range of motion and neck supple.  Skin:    General: Skin is warm and dry.  Neurological:     Mental Status: He  is alert and oriented to person, place, and time.  Psychiatric:        Behavior: Behavior normal.        Thought Content: Thought content normal.        Judgment: Judgment normal.     Ortho Exam  Specialty Comments:  No specialty comments available.  Imaging: No results found.   PMFS History: Patient Active Problem List   Diagnosis Date Noted  . Elevated LFTs 03/23/2020  . Fusion of spine of lumbar region 10/31/2019  . 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 Chronic, continuous use of opioids 07/17/2019  . Facet arthropathy, cervical 07/17/2019  . Neck pain 12/17/2017  . Patellar tendinitis of right knee 08/18/2017  . Medicare annual wellness visit, subsequent 01/27/2017  . Abscess of nasal cavity 06/14/2016  . Right flank  mass 06/14/2016  . Tobacco use disorder 06/14/2016  . Overweight (BMI 25.0-29.9) 06/14/2016  . Sinusitis, chronic 08/12/2015  . Arthralgia 07/06/2015  . Chronic back pain 06/10/2015  . Multiple duodenal ulcers 06/10/2015  . Hyperlipidemia 05/07/2015  . Migraines 04/21/2015  . Essential hypertension 02/16/2015  . Impingement syndrome of shoulder 08/07/2014  . 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 Arthritis   . GERD (gastroesophageal reflux disease)   . Hyperlipidemia   . Hypertension   . Migraines   . Wears glasses     Family History  Problem Relation Age of Onset  . Arthritis Mother   . Hyperlipidemia Mother   . Hypertension Father   . Colon cancer Maternal Grandmother   . Hypertension Sister   . Hypertension Paternal Grandmother   . Heart attack Paternal Grandfather     Past Surgical History:  Procedure Laterality Date  . APPENDECTOMY  1980s   post op bleeding sent him back for ex lap and hemostatic intervention.   Marland Kitchen BACK SURGERY     8 surgeries total - fused from L1-S1  . COLONOSCOPY     Age 38. For bleeding  . ESOPHAGOGASTRODUODENOSCOPY N/A 06/10/2015   Procedure: ESOPHAGOGASTRODUODENOSCOPY (EGD);  Surgeon: Lafayette Dragon, MD;  Location: [facility] ENDOSCOPY;  Service: Endoscopy;  Laterality: N/A;  . ex lap with peri gastric vessel ligation  1990s   after retching, he rupture a blood vessel "outside" the stomach.  vessel was ligated per pt's descriiption.   Fatima Blank HERNIA REPAIR Right 10/21/2016   Procedure: OPEN FLANK HERNIA REPAIR WITH MESH;  Surgeon: Ralene Ok, MD;  Location: [facility];  Service: General;  Laterality: Right;  . INSERTION OF MESH Right 10/21/2016   Procedure: INSERTION OF MESH;  Surgeon: Ralene Ok, MD;  Location: [facility];  Service: General;  Laterality: Right;  . KNEE SURGERY     Multiple bilateral scopes  . RETINAL DETACHMENT SURGERY Right   . SHOULDER SURGERY     4x on the left - total shoulder  replacement.   Marland Kitchen SHOULDER SURGERY Left    x2  . TOTAL SHOULDER ARTHROPLASTY Left    x2   Social History   Occupational History  . Occupation: Disability    Comment: Former Therapist, sports  Tobacco Use  . Smoking status: Current Every Day Smoker    Years: 24.00    Types: Cigarettes  . Smokeless tobacco: Never Used  Vaping Use  . Vaping Use: Never used  Substance and Sexual Activity  . Alcohol use: Yes    Alcohol/week: 0.0 standard drinks    Comment: rarely  . Drug use: No  .  Sexual activity: Not on file

## 2020-10-06 DIAGNOSIS — H33312 Horseshoe tear of retina without detachment, left eye: Secondary | ICD-10-CM | POA: Diagnosis not present

## 2020-10-06 DIAGNOSIS — M47818 Spondylosis without myelopathy or radiculopathy, sacral and sacrococcygeal region: Secondary | ICD-10-CM | POA: Diagnosis not present

## 2020-10-06 DIAGNOSIS — M79671 Pain in right foot: Secondary | ICD-10-CM | POA: Diagnosis not present

## 2020-10-06 DIAGNOSIS — M62830 Muscle spasm of back: Secondary | ICD-10-CM | POA: Diagnosis not present

## 2020-10-06 DIAGNOSIS — M7741 Metatarsalgia, right foot: Secondary | ICD-10-CM | POA: Diagnosis not present

## 2020-10-06 DIAGNOSIS — H35372 Puckering of macula, left eye: Secondary | ICD-10-CM | POA: Diagnosis not present

## 2020-10-06 DIAGNOSIS — H43813 Vitreous degeneration, bilateral: Secondary | ICD-10-CM | POA: Diagnosis not present

## 2020-10-08 DIAGNOSIS — H43813 Vitreous degeneration, bilateral: Secondary | ICD-10-CM | POA: Diagnosis not present

## 2020-10-13 DIAGNOSIS — G5761 Lesion of plantar nerve, right lower limb: Secondary | ICD-10-CM | POA: Diagnosis not present

## 2020-10-20 DIAGNOSIS — Z9889 Other specified postprocedural states: Secondary | ICD-10-CM | POA: Insufficient documentation

## 2020-10-23 ENCOUNTER — Ambulatory Visit: Payer: Medicare Other | Admitting: Family Medicine

## 2020-10-30 DIAGNOSIS — H33312 Horseshoe tear of retina without detachment, left eye: Secondary | ICD-10-CM | POA: Diagnosis not present

## 2020-11-04 ENCOUNTER — Encounter: Payer: Self-pay | Admitting: Family Medicine

## 2020-11-04 ENCOUNTER — Other Ambulatory Visit: Payer: Self-pay

## 2020-11-04 ENCOUNTER — Ambulatory Visit (INDEPENDENT_AMBULATORY_CARE_PROVIDER_SITE_OTHER): Payer: Medicare Other | Admitting: Family Medicine

## 2020-11-04 VITALS — BP 130/77 | HR 90 | Ht 68.5 in | Wt 188.6 lb

## 2020-11-04 DIAGNOSIS — R7989 Other specified abnormal findings of blood chemistry: Secondary | ICD-10-CM

## 2020-11-04 DIAGNOSIS — I10 Essential (primary) hypertension: Secondary | ICD-10-CM | POA: Diagnosis not present

## 2020-11-04 DIAGNOSIS — Z0000 Encounter for general adult medical examination without abnormal findings: Secondary | ICD-10-CM

## 2020-11-04 DIAGNOSIS — F902 Attention-deficit hyperactivity disorder, combined type: Secondary | ICD-10-CM | POA: Insufficient documentation

## 2020-11-04 DIAGNOSIS — E349 Endocrine disorder, unspecified: Secondary | ICD-10-CM

## 2020-11-04 DIAGNOSIS — E782 Mixed hyperlipidemia: Secondary | ICD-10-CM | POA: Diagnosis not present

## 2020-11-04 DIAGNOSIS — E559 Vitamin D deficiency, unspecified: Secondary | ICD-10-CM | POA: Insufficient documentation

## 2020-11-04 DIAGNOSIS — F17200 Nicotine dependence, unspecified, uncomplicated: Secondary | ICD-10-CM

## 2020-11-04 DIAGNOSIS — G43809 Other migraine, not intractable, without status migrainosus: Secondary | ICD-10-CM | POA: Diagnosis not present

## 2020-11-04 DIAGNOSIS — E663 Overweight: Secondary | ICD-10-CM

## 2020-11-04 DIAGNOSIS — R0683 Snoring: Secondary | ICD-10-CM

## 2020-11-04 NOTE — Progress Notes (Signed)
Office Visit Note   Patient: Jeremiah Anderson           Date of Birth: 05-15-1966           MRN: [id number] Visit Date: 11/04/2020 Requested by: Eunice Blase, MD [address] [place],  [place] [address] PCP: Eunice Blase, MD  Subjective: Chief Complaint  Patient presents with  . Annual Exam    HPI: He is here for a wellness examination.  He has hypertension controlled with amlodipine.  He has elevated liver enzymes, etiology uncertain.  Hyper lipidemia has been managed with Crestor.  He has migraine headaches for which he takes sumatriptan.  He is monitored by urology for testosterone deficiency.  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  His fiance tells him he snores. He has also been told that he had a deviated septum. He wonders if they are connected.  He had ADHD as a child and he thinks the symptoms are returning.  He is a former smoker who was able to quit a couple years ago.  He has a history of vitamin D deficiency.  He has chronic pain managed by pain clinic.   Family history was updated.  He had colonoscopy in 2018 and it was normal.                 ROS:   All other systems were reviewed and are negative.  Objective: Vital Signs: BP 130/77   Pulse 90   Ht 5' 8.5" (1.74 m)   Wt 188 lb 9.6 oz (85.5 kg)   BMI 28.26 kg/m   Physical Exam:  General:  Alert and oriented, in no acute distress. Pulm:  Breathing unlabored. Psy:  Normal mood, congruent affect. Skin: No suspicious lesions. HEENT:  [place]/AT, PERRLA, EOM Full, no nystagmus.  Funduscopic examination within normal limits.  No conjunctival erythema.  Tympanic membranes are pearly gray with normal landmarks.  External ear canals are normal.  Nasal passages are clear.  Oropharynx is clear.  No significant lymphadenopathy.  No thyromegaly or nodules.  2+ carotid pulses without bruits. CV: Regular rate and rhythm without murmurs, rubs, or gallops.  No peripheral edema.  2+  radial and posterior tibial pulses. Lungs: Clear to auscultation throughout with no wheezing or areas of consolidation. Abd: Bowel sounds are active, no hepatosplenomegaly or masses.  Soft and nontender.  No audible bruits.  No evidence of ascites.    Imaging: No results found.  Assessment & Plan: 1. Wellness examination -Labs today to monitor.  2. Possible ADHD -He will try some dietary changes, avoiding gluten and dairy.  3. Snoring -Sleep study ordered.  4. Hypertension, hyperlipidemia, etc. -Continue with current regimen. Follow-up in 6 to 12 months.     Procedures: No procedures performed        PMFS History: Patient Active Problem List   Diagnosis Date Noted  . Testosterone deficiency 11/04/2020  . Vitamin D deficiency 11/04/2020  . Attention deficit hyperactivity disorder (ADHD), combined type 11/04/2020  . Elevated LFTs 03/23/2020  . Fusion of spine of lumbar region 10/31/2019  . 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 Chronic, continuous use of opioids 07/17/2019  . Facet arthropathy, cervical 07/17/2019  . Neck pain 12/17/2017  . Patellar tendinitis of right knee 08/18/2017  . Medicare annual wellness visit, subsequent 01/27/2017  . Abscess of nasal cavity 06/14/2016  . Right flank mass 06/14/2016  . Tobacco use disorder 06/14/2016  . Overweight (BMI 25.0-29.9) 06/14/2016  . Sinusitis, chronic  08/12/2015  . Arthralgia 07/06/2015  . Chronic back pain 06/10/2015  . Multiple duodenal ulcers 06/10/2015  . Hyperlipidemia 05/07/2015  . Migraines 04/21/2015  . Essential hypertension 02/16/2015  . Impingement syndrome of shoulder 08/07/2014  . 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 Arthritis   . GERD (gastroesophageal reflux disease)   . Hyperlipidemia   . Hypertension   . Migraines   . Wears glasses     Family History  Problem Relation Age of Onset  . Arthritis Mother   . Hyperlipidemia Mother   .  Hypertension Father   . Cancer Father   . Skin cancer Father   . Colon cancer Maternal Grandmother   . Cancer Maternal Grandmother   . Hypertension Sister   . Hypertension Paternal Grandmother   . Heart attack Paternal Grandfather   . Heart disease Paternal Grandfather   . Diabetes Neg Hx   . Stroke Neg Hx   . Prostate cancer Neg Hx     Past Surgical History:  Procedure Laterality Date  . APPENDECTOMY  1980s   post op bleeding sent him back for ex lap and hemostatic intervention.   Marland Kitchen BACK SURGERY     8 surgeries total - fused from L1-S1  . COLONOSCOPY     Age 72. For bleeding  . ESOPHAGOGASTRODUODENOSCOPY N/A 06/10/2015   Procedure: ESOPHAGOGASTRODUODENOSCOPY (EGD);  Surgeon: Lafayette Dragon, MD;  Location: [facility] ENDOSCOPY;  Service: Endoscopy;  Laterality: N/A;  . ex lap with peri gastric vessel ligation  1990s   after retching, he rupture a blood vessel "outside" the stomach.  vessel was ligated per pt's descriiption.   Fatima Blank HERNIA REPAIR Right 10/21/2016   Procedure: OPEN FLANK HERNIA REPAIR WITH MESH;  Surgeon: Ralene Ok, MD;  Location: [facility];  Service: General;  Laterality: Right;  . INSERTION OF MESH Right 10/21/2016   Procedure: INSERTION OF MESH;  Surgeon: Ralene Ok, MD;  Location: [facility];  Service: General;  Laterality: Right;  . KNEE SURGERY     Multiple bilateral scopes  . RETINAL DETACHMENT SURGERY Right   . SHOULDER SURGERY     4x on the left - total shoulder replacement.   Marland Kitchen SHOULDER SURGERY Left    x2  . TOTAL SHOULDER ARTHROPLASTY Left    x2   Social History   Occupational History  . Occupation: Disability    Comment: Former Therapist, sports  Tobacco Use  . Smoking status: Former Smoker    Years: 24.00    Types: Cigarettes  . Smokeless tobacco: Never Used  Vaping Use  . Vaping Use: Never used  Substance and Sexual Activity  . Alcohol use: Yes    Alcohol/week: 0.0 standard drinks    Comment: rarely  . Drug use: No  . Sexual activity: Not on file

## 2020-11-05 LAB — HEPATIC FUNCTION PANEL
AG Ratio: 2.5 (calc) (ref 1.0–2.5)
ALT: 29 U/L (ref 9–46)
AST: 24 U/L (ref 10–35)
Albumin: 4.8 g/dL (ref 3.6–5.1)
Alkaline phosphatase (APISO): 46 U/L (ref 35–144)
Bilirubin, Direct: 0.1 mg/dL (ref 0.0–0.2)
Globulin: 1.9 g/dL (calc) (ref 1.9–3.7)
Indirect Bilirubin: 0.2 mg/dL (calc) (ref 0.2–1.2)
Total Bilirubin: 0.3 mg/dL (ref 0.2–1.2)
Total Protein: 6.7 g/dL (ref 6.1–8.1)

## 2020-11-05 LAB — COMPREHENSIVE METABOLIC PANEL
AG Ratio: 2.5 (calc) (ref 1.0–2.5)
ALT: 29 U/L (ref 9–46)
AST: 24 U/L (ref 10–35)
Albumin: 4.8 g/dL (ref 3.6–5.1)
Alkaline phosphatase (APISO): 46 U/L (ref 35–144)
BUN: 18 mg/dL (ref 7–25)
CO2: 29 mmol/L (ref 20–32)
Calcium: 10.3 mg/dL (ref 8.6–10.3)
Chloride: 102 mmol/L (ref 98–110)
Creat: 1.14 mg/dL (ref 0.70–1.33)
Globulin: 1.9 g/dL (calc) (ref 1.9–3.7)
Glucose, Bld: 105 mg/dL — ABNORMAL HIGH (ref 65–99)
Potassium: 4.8 mmol/L (ref 3.5–5.3)
Sodium: 140 mmol/L (ref 135–146)
Total Bilirubin: 0.3 mg/dL (ref 0.2–1.2)
Total Protein: 6.7 g/dL (ref 6.1–8.1)

## 2020-11-05 LAB — CBC WITH DIFFERENTIAL/PLATELET
Absolute Monocytes: 511 cells/uL (ref 200–950)
Basophils Absolute: 51 cells/uL (ref 0–200)
Basophils Relative: 0.7 %
Eosinophils Absolute: 248 cells/uL (ref 15–500)
Eosinophils Relative: 3.4 %
HCT: 44.7 % (ref 38.5–50.0)
Hemoglobin: 15.2 g/dL (ref 13.2–17.1)
Lymphs Abs: 2270 cells/uL (ref 850–3900)
MCH: 29.9 pg (ref 27.0–33.0)
MCHC: 34 g/dL (ref 32.0–36.0)
MCV: 87.8 fL (ref 80.0–100.0)
MPV: 10 fL (ref 7.5–12.5)
Monocytes Relative: 7 %
Neutro Abs: 4219 cells/uL (ref 1500–7800)
Neutrophils Relative %: 57.8 %
Platelets: 257 10*3/uL (ref 140–400)
RBC: 5.09 10*6/uL (ref 4.20–5.80)
RDW: 12.3 % (ref 11.0–15.0)
Total Lymphocyte: 31.1 %
WBC: 7.3 10*3/uL (ref 3.8–10.8)

## 2020-11-05 LAB — PSA: PSA: 0.37 ng/mL (ref <=4.0)

## 2020-11-05 LAB — THYROID PANEL WITH TSH
Free Thyroxine Index: 2.1 (ref 1.4–3.8)
T3 Uptake: 32 % (ref 22–35)
T4, Total: 6.7 ug/dL (ref 4.9–10.5)
TSH: 0.73 mIU/L (ref 0.40–4.50)

## 2020-11-05 LAB — HIGH SENSITIVITY CRP: hs-CRP: 0.6 mg/L

## 2020-11-05 LAB — MEASLES/MUMPS/RUBELLA IMMUNITY
Mumps IgG: 25.8 AU/mL
Rubella: 5.92 Index
Rubeola IgG: 54.7 AU/mL

## 2020-11-05 LAB — LIPID PANEL
Cholesterol: 153 mg/dL (ref <200)
HDL: 43 mg/dL (ref >=40)
LDL Cholesterol (Calc): 82 mg/dL (calc)
Non-HDL Cholesterol (Calc): 110 mg/dL (calc) (ref <130)
Total CHOL/HDL Ratio: 3.6 (calc) (ref <5.0)
Triglycerides: 187 mg/dL — ABNORMAL HIGH (ref <150)

## 2020-11-05 LAB — VITAMIN D 25 HYDROXY (VIT D DEFICIENCY, FRACTURES): Vit D, 25-Hydroxy: 88 ng/mL (ref 30–100)

## 2020-11-05 LAB — VARICELLA ZOSTER ANTIBODY, IGG: Varicella IgG: >4000 index

## 2020-11-05 LAB — HEPATITIS B SURFACE ANTIBODY,QUALITATIVE: Hep B S Ab: NONREACTIVE

## 2020-11-06 ENCOUNTER — Telehealth: Payer: Self-pay | Admitting: Family Medicine

## 2020-11-06 NOTE — Telephone Encounter (Signed)
Labs are notable for the following:  Hepatitis B surface antibody was negative, indicating that you are not immune to hepatitis B.  You may need to have the series again depending on work requirements.  You are not immune to varicella, measles, mumps, and rubella.  No need for additional vaccinations.  Vitamin D level looks perfect.  No changes needed.  Thyroid function and PSA look perfect.  Lipid panel shows slightly elevated triglycerides.  Dietary limitation of processed carbohydrates and sweets will usually normalize this.  Liver enzymes look normal.  CRP is normal.  Blood glucose is elevated in prediabetes range at 105.  Once again, dietary limitation of processed carbohydrates and sweets will usually keep you from becoming diabetic.  CBC was normal.

## 2020-11-11 DIAGNOSIS — M7741 Metatarsalgia, right foot: Secondary | ICD-10-CM | POA: Diagnosis not present

## 2020-11-11 DIAGNOSIS — M47818 Spondylosis without myelopathy or radiculopathy, sacral and sacrococcygeal region: Secondary | ICD-10-CM | POA: Diagnosis not present

## 2020-11-11 DIAGNOSIS — M79671 Pain in right foot: Secondary | ICD-10-CM | POA: Diagnosis not present

## 2020-11-11 DIAGNOSIS — Z79891 Long term (current) use of opiate analgesic: Secondary | ICD-10-CM | POA: Diagnosis not present

## 2020-11-11 DIAGNOSIS — M62830 Muscle spasm of back: Secondary | ICD-10-CM | POA: Diagnosis not present

## 2020-11-11 DIAGNOSIS — G894 Chronic pain syndrome: Secondary | ICD-10-CM | POA: Diagnosis not present

## 2020-11-28 ENCOUNTER — Other Ambulatory Visit: Payer: Self-pay | Admitting: Family Medicine

## 2020-12-09 DIAGNOSIS — M62830 Muscle spasm of back: Secondary | ICD-10-CM | POA: Diagnosis not present

## 2020-12-09 DIAGNOSIS — M79671 Pain in right foot: Secondary | ICD-10-CM | POA: Diagnosis not present

## 2020-12-09 DIAGNOSIS — M7741 Metatarsalgia, right foot: Secondary | ICD-10-CM | POA: Diagnosis not present

## 2020-12-09 DIAGNOSIS — M47818 Spondylosis without myelopathy or radiculopathy, sacral and sacrococcygeal region: Secondary | ICD-10-CM | POA: Diagnosis not present

## 2020-12-14 ENCOUNTER — Ambulatory Visit: Payer: Medicare Other | Admitting: Specialist

## 2020-12-14 ENCOUNTER — Other Ambulatory Visit: Payer: Medicare Other

## 2020-12-15 ENCOUNTER — Institutional Professional Consult (permissible substitution): Payer: Medicare Other | Admitting: Neurology

## 2020-12-15 ENCOUNTER — Other Ambulatory Visit: Payer: Medicare Other

## 2020-12-15 DIAGNOSIS — Z20822 Contact with and (suspected) exposure to covid-19: Secondary | ICD-10-CM | POA: Diagnosis not present

## 2020-12-17 LAB — SARS-COV-2, NAA 2 DAY TAT

## 2020-12-17 LAB — NOVEL CORONAVIRUS, NAA: SARS-CoV-2, NAA: NOT DETECTED

## 2021-01-06 ENCOUNTER — Encounter: Payer: Self-pay | Admitting: Neurology

## 2021-01-06 ENCOUNTER — Ambulatory Visit: Payer: Medicare Other | Admitting: Neurology

## 2021-01-06 VITALS — BP 134/92 | HR 83 | Ht 69.0 in | Wt 190.3 lb

## 2021-01-06 DIAGNOSIS — Z820 Family history of epilepsy and other diseases of the nervous system: Secondary | ICD-10-CM | POA: Diagnosis not present

## 2021-01-06 DIAGNOSIS — R351 Nocturia: Secondary | ICD-10-CM | POA: Diagnosis not present

## 2021-01-06 DIAGNOSIS — M7741 Metatarsalgia, right foot: Secondary | ICD-10-CM | POA: Diagnosis not present

## 2021-01-06 DIAGNOSIS — E663 Overweight: Secondary | ICD-10-CM | POA: Diagnosis not present

## 2021-01-06 DIAGNOSIS — Z79891 Long term (current) use of opiate analgesic: Secondary | ICD-10-CM

## 2021-01-06 DIAGNOSIS — Z789 Other specified health status: Secondary | ICD-10-CM

## 2021-01-06 DIAGNOSIS — M62830 Muscle spasm of back: Secondary | ICD-10-CM | POA: Diagnosis not present

## 2021-01-06 DIAGNOSIS — R0683 Snoring: Secondary | ICD-10-CM | POA: Diagnosis not present

## 2021-01-06 DIAGNOSIS — M79671 Pain in right foot: Secondary | ICD-10-CM | POA: Diagnosis not present

## 2021-01-06 DIAGNOSIS — M47818 Spondylosis without myelopathy or radiculopathy, sacral and sacrococcygeal region: Secondary | ICD-10-CM | POA: Diagnosis not present

## 2021-01-06 NOTE — Patient Instructions (Signed)

## 2021-01-06 NOTE — Progress Notes (Signed)
Subjective:    Patient ID: Jeremiah Anderson is a 55 y.o. male.  HPI     [name], MD, PhD [facility] [address], [address] [place], [place] [address]  Dear Dr. Junius Roads,   I saw your patient, Jeremiah Anderson, upon your kind request in my sleep clinic today for initial consultation of his sleep disorder, in particular, concern for underlying obstructive sleep apnea.  Patient is unaccompanied today.  As you know, [name] is a 55 year old right-handed gentleman with an underlying medical history of hypertension, migraine headaches, elevated LFTs, hyperlipidemia, prior smoking, testosterone deficiency, vitamin D deficiency, ADHD, arthritis, reflux disease, and overweight state, chronic pain, on chronic narcotic pain medication, status post multiple surgeries, who reports snoring and some sleep disruption.  Snoring is bothersome to his fiance.  He has had difficulty maintaining sleep at night, he has worked nights for years, he is currently not working.  He moved in with his mom.  He has a variable sleep schedule.  He stays up late, sometimes goes to bed in the early morning hours and sleeps till noon or 1 PM.  Sometimes he wakes up earlier.  He does have some nocturia, about once per average night, does not have any episodes of gasping for air or witnessed apneas.  He does drink quite a bit of caffeine in the form of soda, 6 to 10 cans or bottles per day on average.   Excessive daytime somnolence.  I reviewed your office note from 11/05/2019 more.  His Epworth sleepiness score is 7 out of 2010.  Fatigue severity score is 18 out of 63.  Of note, he is followed by pain management Dr. Hardin Negus, he takes long-acting morphine 80 mg twice daily and short acting morphine, 15 mg up to 5 times a day as needed.  He does not take baclofen on a regular basis or tizanidine, he reports feeling groggy from these medicines.  He quit smoking some 2 or 3 years ago and does not drink  alcohol.  His mom has sleep apnea and had a CPAP machine but has been using a dental device.  His Past Medical History Is Significant For: Past Medical History:  Diagnosis Date  . Arthritis   . GERD (gastroesophageal reflux disease)   . Hyperlipidemia   . Hypertension   . Migraines   . Wears glasses     His Past Surgical History Is Significant For: Past Surgical History:  Procedure Laterality Date  . APPENDECTOMY  1980s   post op bleeding sent him back for ex lap and hemostatic intervention.   Marland Kitchen BACK SURGERY     8 surgeries total - fused from L1-S1  . COLONOSCOPY     Age 22. For bleeding  . ESOPHAGOGASTRODUODENOSCOPY N/A 06/10/2015   Procedure: ESOPHAGOGASTRODUODENOSCOPY (EGD);  Surgeon: Lafayette Dragon, MD;  Location: [facility] ENDOSCOPY;  Service: Endoscopy;  Laterality: N/A;  . ex lap with peri gastric vessel ligation  1990s   after retching, he rupture a blood vessel "outside" the stomach.  vessel was ligated per pt's descriiption.   Fatima Blank HERNIA REPAIR Right 10/21/2016   Procedure: OPEN FLANK HERNIA REPAIR WITH MESH;  Surgeon: Ralene Ok, MD;  Location: [place];  Service: General;  Laterality: Right;  . INSERTION OF MESH Right 10/21/2016   Procedure: INSERTION OF MESH;  Surgeon: Ralene Ok, MD;  Location: [place];  Service: General;  Laterality: Right;  . KNEE SURGERY     Multiple bilateral scopes  .  RETINAL DETACHMENT SURGERY Right   . SHOULDER SURGERY     4x on the left - total shoulder replacement.   Marland Kitchen SHOULDER SURGERY Left    x2  . TOTAL SHOULDER ARTHROPLASTY Left    x2    His Family History Is Significant For: Family History  Problem Relation Age of Onset  . Arthritis Mother   . Hyperlipidemia Mother   . Hypertension Father   . Cancer Father   . Skin cancer Father   . Colon cancer Maternal Grandmother   . Cancer Maternal Grandmother   . Hypertension Sister   . Hypertension Paternal Grandmother   . Heart attack Paternal Grandfather   . Heart disease  Paternal Grandfather   . Diabetes Neg Hx   . Stroke Neg Hx   . Prostate cancer Neg Hx     His Social History Is Significant For: Social History   Socioeconomic History  . Marital status: Single    Spouse name: Not on file  . Number of children: 0  . Years of education: 16  . Highest education level: Bachelor's degree (e.g., BA, AB, BS)  Occupational History  . Occupation: Disability    Comment: Former Therapist, sports  Tobacco Use  . Smoking status: Former Smoker    Years: 24.00    Types: Cigarettes  . Smokeless tobacco: Never Used  Vaping Use  . Vaping Use: Never used  Substance and Sexual Activity  . Alcohol use: Not Currently    Alcohol/week: 0.0 standard drinks  . Drug use: No  . Sexual activity: Not on file  Other Topics Concern  . Not on file  Social History Narrative   Fun: play guitar / limited funds      Denies any religious beliefs effecting health care.    Social Determinants of Health   Financial Resource Strain: Not on file  Food Insecurity: Not on file  Transportation Needs: Not on file  Physical Activity: Not on file  Stress: Not on file  Social Connections: Not on file    His Allergies Are:  Allergies  Allergen Reactions  . Fentanyl Other (See Comments)    Hallucinations (with the patch)   . Lisinopril Swelling  . Lyrica [Pregabalin]     hallucinations  . Sulfa Antibiotics Swelling  :   His Current Medications Are:  Outpatient Encounter Medications as of 01/06/2021  Medication Sig  . amLODipine (NORVASC) 10 MG tablet Take 1 tablet (10 mg total) by mouth daily.  . baclofen (LIORESAL) 10 MG tablet   . Cholecalciferol (VITAMIN D3) 25 MCG (1000 UT) CAPS Take by mouth daily.  . Coenzyme Q10 (COQ10) 100 MG CAPS Take 100 mg by mouth daily.  . cyanocobalamin 100 MCG tablet Take 100 mcg by mouth daily.  . fluticasone (FLONASE) 50 MCG/ACT nasal spray USE 2 SPRAYS INTO BOTH NOSTRILS DAILY. MUST MAKE APPT W/NEW PROVIDER FOR FUTURE REFILLS  . Lidocaine 4 % PTCH  Place 1-2 patches onto the skin 2 (two) times daily as needed (for pain.). Salonpas Maximum Strength Pain Relieving Gel-Patch  . morphine (KADIAN) 80 MG 24 hr capsule Take 80 mg by mouth 2 (two) times daily.  Marland Kitchen morphine (MSIR) 15 MG tablet Take 15 mg by mouth 5 (five) times daily as needed.  . Multiple Vitamin (MULTIVITAMIN WITH MINERALS) TABS tablet Take 1 tablet by mouth daily.  . Omega-3 Fatty Acids (OMEGA 3 PO) Take 120 mg by mouth daily.  . ondansetron (ZOFRAN) 4 MG tablet TAKE 1 TABLET BY MOUTH EVERY  8 HOURS AS NEEDED FOR NAUSEA AND VOMITING  . rosuvastatin (CRESTOR) 20 MG tablet Take 1 tablet (20 mg total) by mouth daily.  . SUMAtriptan (IMITREX) 100 MG tablet TAKE 1 TABLET AT EARLIEST ONSET OF MIGRAINE. MAY REPEAT IN 2HRS IF HEADACHE PERSISTS OR RECURS  . tazarotene (TAZORAC) 0.1 % gel APPLY A SMALL AMOUNT TO AFFECTED AREA AT BEDTIME  . testosterone cypionate (DEPOTESTOSTERONE CYPIONATE) 200 MG/ML injection   . tiZANidine (ZANAFLEX) 4 MG tablet    Facility-Administered Encounter Medications as of 01/06/2021  Medication  . 0.9 %  sodium chloride infusion  :  Review of Systems:  Out of a complete 14 point review of systems, all are reviewed and negative with the exception of these symptoms as listed below: Review of Systems  Neurological:       Here for sleep consult, no prior sleep study. Reports he does snore at night.  Epworth Sleepiness Scale 0= would never doze 1= slight chance of dozing 2= moderate chance of dozing 3= high chance of dozing  Sitting and reading:1 Watching TV:1 Sitting inactive in a public place (ex. Theater or meeting):0 As a passenger in a car for an hour without a break:1 Lying down to rest in the afternoon:3 Sitting and talking to someone:0 Sitting quietly after lunch (no alcohol):1 In a car, while stopped in traffic:0 Total:7     Objective:  Neurological Exam  Physical Exam Physical Examination:   Vitals:   01/06/21 1509  BP: (!) 134/92   Pulse: 83  SpO2: 98%   General Examination: The patient is a very pleasant 55 y.o. male in no acute distress. He appears well-developed and well-nourished and well groomed.  He is very restless, moving constantly, very fidgety.    HEENT: Normocephalic, atraumatic, pupils are equal, round and reactive to light, extraocular tracking is good without limitation to gaze excursion or nystagmus noted. Hearing is grossly intact. Face is symmetric with normal facial animation. Speech is clear with no dysarthria noted. There is no hypophonia. There is no lip, neck/head, jaw or voice tremor. Neck is supple with full range of passive and active motion. There are no carotid bruits on auscultation. Oropharynx exam reveals: mild mouth dryness, adequate dental hygiene and mild airway crowding, due to small airway entry, tonsillar size of about 1+ bilaterally.  Mallampati class I.  Neck circumference of 17-7/8 inches.  He has a minimal overbite.  Tongue protrudes centrally and palate elevates symmetrically.  No significant deviated septum on nasal inspection.  Chest: Clear to auscultation without wheezing, rhonchi or crackles noted.  Heart: S1+S2+0, regular and normal without murmurs, rubs or gallops noted.   Abdomen: Soft, non-tender and non-distended with normal bowel sounds appreciated on auscultation.  Extremities: There is no pitting edema in the distal lower extremities bilaterally.   Skin: Warm and dry without trophic changes noted.   Musculoskeletal: exam reveals no obvious joint deformities, tenderness or joint swelling or erythema.   Neurologically:  Mental status: The patient is awake, alert and oriented in all 4 spheres. His immediate and remote memory, attention, language skills and fund of knowledge are appropriate. There is no evidence of aphasia, agnosia, apraxia or anomia. Speech is clear with normal prosody and enunciation.  Slightly pressured speech.  Thought process is linear. Mood is normal  and affect is normal.  Cranial nerves II - XII are as described above under HEENT exam.  Motor exam: Normal bulk, strength and tone is noted. There is no tremor, fine motor skills and  coordination: grossly intact.  Cerebellar testing: No dysmetria or intention tremor. There is no truncal or gait ataxia.  Sensory exam: intact to light touch in the upper and lower extremities.  Gait, station and balance: He stands easily. No veering to one side is noted. No leaning to one side is noted. Posture is age-appropriate and stance is narrow based. Gait shows normal stride length and normal pace. No problems turning are noted.   Assessment and Plan:   In summary, Artavious Trebilcock is a very pleasant 55 y.o.-year old male with an underlying medical history of hypertension, migraine headaches, elevated LFTs, hyperlipidemia, prior smoking, testosterone deficiency, vitamin D deficiency, ADHD, arthritis, reflux disease, and overweight state, chronic pain, on chronic narcotic pain medication, status post multiple surgeries, whose history and physical exam are concerning for obstructive sleep apnea (OSA).  He is at risk for sleep disordered breathing also because of chronic narcotic medication use. I had a long chat with the patient about my findings and the diagnosis of OSA, its prognosis and treatment options. We talked about medical treatments, surgical interventions and non-pharmacological approaches. I explained in particular the risks and ramifications of untreated moderate to severe OSA, especially with respect to developing cardiovascular disease down the Road, including congestive heart failure, difficult to treat hypertension, cardiac arrhythmias, or stroke. Even type 2 diabetes has, in part, been linked to untreated OSA. Symptoms of untreated OSA include daytime sleepiness, memory problems, mood irritability and mood disorder such as depression and anxiety, lack of energy, as well as recurrent headaches, especially  morning headaches. We talked about trying to maintain a healthy lifestyle in general, as well as the importance of weight control. We also talked about the importance of good sleep hygiene.  He is discouraged from drinking this much caffeine. I recommended the following at this time: sleep study.  I explained the sleep test procedure to the patient and also outlined possible surgical and non-surgical treatment options of OSA, including the use of a custom-made dental device (which would require a referral to a specialist dentist or oral surgeon), upper airway surgical options, such as traditional UPPP or a novel less invasive surgical option in the form of Inspire hypoglossal nerve stimulation (which would involve a referral to an ENT surgeon). I also explained the CPAP treatment option to the patient, who indicated that he would be willing to try CPAP if the need arises. I explained the importance of being compliant with PAP treatment, not only for insurance purposes but primarily to improve His symptoms, and for the patient's long term health benefit, including to reduce His cardiovascular risks. I answered all his questions today and the patient was in agreement. I plan to see him back after the sleep study is completed and encouraged him to call with any interim questions, concerns, problems or updates.   Thank you very much for allowing me to participate in the care of this nice patient. If I can be of any further assistance to you please do not hesitate to call me at [phone].  Sincerely,   [name], MD, PhD

## 2021-01-07 ENCOUNTER — Ambulatory Visit: Payer: Self-pay

## 2021-01-07 ENCOUNTER — Other Ambulatory Visit: Payer: Self-pay

## 2021-01-07 ENCOUNTER — Ambulatory Visit (INDEPENDENT_AMBULATORY_CARE_PROVIDER_SITE_OTHER): Payer: Medicare Other | Admitting: Specialist

## 2021-01-07 ENCOUNTER — Encounter: Payer: Self-pay | Admitting: Specialist

## 2021-01-07 VITALS — BP 143/88 | HR 69 | Ht 69.0 in | Wt 190.0 lb

## 2021-01-07 DIAGNOSIS — M25559 Pain in unspecified hip: Secondary | ICD-10-CM

## 2021-01-07 DIAGNOSIS — M5134 Other intervertebral disc degeneration, thoracic region: Secondary | ICD-10-CM | POA: Diagnosis not present

## 2021-01-07 DIAGNOSIS — M25552 Pain in left hip: Secondary | ICD-10-CM | POA: Diagnosis not present

## 2021-01-07 DIAGNOSIS — M4325 Fusion of spine, thoracolumbar region: Secondary | ICD-10-CM

## 2021-01-07 DIAGNOSIS — M1612 Unilateral primary osteoarthritis, left hip: Secondary | ICD-10-CM | POA: Diagnosis not present

## 2021-01-07 NOTE — Patient Instructions (Signed)
Plan: Hip is suffering from osteoarthritis, only real proven treatments are Weight loss, NSIADs like diclofenac and exercise. Well padded shoes help. Ice the hip that is suffering from osteoarthritis, only real proven treatments are Weight loss, NSIADs like diclofenac and exercise. Well padded shoes help. Ice the hip 2-3 times a day 15-20 mins at a time.-3 times a day 15-20 mins at a time. Hot showers in the AM.  Injection with steroid may be of benefit. Hemp CBD capsules, amazon.com 5,000-7,000 mg per bottle, 60 capsules per bottle, take one capsule twice a day. Cane in the right hand to use with left leg weight bearing. Follow-Up Instructions: No follow-ups on file.  Injection of the left hip for diagnostic purposes.

## 2021-01-07 NOTE — Progress Notes (Signed)
Office Visit Note   Patient: Jeremiah Anderson           Date of Birth: [birth year]           MRN: [id number] Visit Date: 01/07/2021              Requested by: Eunice Blase, MD [address] [place],  [place] [address] PCP: Eunice Blase, MD   Assessment & Plan: Visit Diagnoses:  1. Hip pain     Plan: Plan: Hip is suffering from osteoarthritis, only real proven treatments are Weight loss, NSIADs like diclofenac and exercise. Well padded shoes help. Ice the hip that is suffering from osteoarthritis, only real proven treatments are Weight loss, NSIADs like diclofenac and exercise. Well padded shoes help. Ice the hip 2-3 times a day 15-20 mins at a time.-3 times a day 15-20 mins at a time. Hot showers in the AM.  Injection with steroid may be of benefit. Hemp CBD capsules, amazon.com 5,000-7,000 mg per bottle, 60 capsules per bottle, take one capsule twice a day. Cane in the right hand to use with left leg weight bearing. Follow-Up Instructions: No follow-ups on file.  Injection of the left hip for diagnostic purposes.  Follow-Up Instructions: Return in about 3 weeks (around 01/28/2021).   Orders:  Orders Placed This Encounter  Procedures  . XR HIP UNILAT W OR W/O PELVIS 2-3 VIEWS LEFT   No orders of the defined types were placed in this encounter.     Procedures: No procedures performed   Clinical Data: No additional findings.   Subjective: Chief Complaint  Patient presents with  . Left Hip - Pain    55 year old male with long history of thoracolumbar fusion done at the [facility] in [place]. He has had a right later flank posterior hernia repair and has done well with this. Has pain in the back right buttock and recent fall on icey driveway when going to move his mother car. No bowel or bladder difficulty. He had a morton's neuroma resected and is wearing a post op boot, that surgery done 3 months ago.    Review of Systems  Constitutional: Negative.   HENT:  Negative.   Eyes: Negative.   Respiratory: Negative.   Cardiovascular: Negative.   Gastrointestinal: Negative.   Endocrine: Negative.   Genitourinary: Negative.   Musculoskeletal: Negative.   Skin: Negative.   Allergic/Immunologic: Negative.   Neurological: Negative.   Hematological: Negative.   Psychiatric/Behavioral: Negative.      Objective: Vital Signs: BP (!) 143/88 (BP Location: Left Arm, Patient Position: Sitting)   Pulse 69   Ht 5\' 9"  (1.753 m)   Wt 190 lb (86.2 kg)   BMI 28.06 kg/m   Physical Exam Constitutional:      Appearance: He is well-developed and well-nourished.  HENT:     Head: Normocephalic and atraumatic.  Eyes:     Extraocular Movements: EOM normal.     Pupils: Pupils are equal, round, and reactive to light.  Pulmonary:     Effort: Pulmonary effort is normal.     Breath sounds: Normal breath sounds.  Abdominal:     General: Bowel sounds are normal.     Palpations: Abdomen is soft.  Musculoskeletal:     Cervical back: Normal range of motion and neck supple.  Skin:    General: Skin is warm and dry.  Neurological:     Mental Status: He is alert and oriented to person, place, and time.  Psychiatric:  Mood and Affect: Mood and affect normal.        Behavior: Behavior normal.        Thought Content: Thought content normal.        Judgment: Judgment normal.     Back Exam   Tenderness  The patient is experiencing tenderness in the lumbar.  Range of Motion  Extension: abnormal  Flexion: abnormal  Lateral bend right: normal  Lateral bend left: normal  Rotation right: normal  Rotation left: normal   Muscle Strength  Right Quadriceps:  5/5  Left Quadriceps:  5/5  Right Hamstrings:  5/5  Left Hamstrings:  5/5   Other  Toe walk: normal Heel walk: normal      Specialty Comments:  No specialty comments available.  Imaging: XR HIP UNILAT W OR W/O PELVIS 2-3 VIEWS LEFT  Result Date: 01/07/2021 AP standing radiograph and  lateral left hip demonstrate left hip joint line is narrowed centrally and there is an  Inferomedial femoral head spur. The right hip also show some mild joint line narrowing and spur formation the left side is worse than the left.     PMFS History: Patient Active Problem List   Diagnosis Date Noted  . Testosterone deficiency 11/04/2020  . Vitamin D deficiency 11/04/2020  . Attention deficit hyperactivity disorder (ADHD), combined type 11/04/2020  . Elevated LFTs 03/23/2020  . Fusion of spine of lumbar region 10/31/2019  . 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 Chronic, continuous use of opioids 07/17/2019  . Facet arthropathy, cervical 07/17/2019  . Neck pain 12/17/2017  . Patellar tendinitis of right knee 08/18/2017  . Medicare annual wellness visit, subsequent 01/27/2017  . Abscess of nasal cavity 06/14/2016  . Right flank mass 06/14/2016  . Tobacco use disorder 06/14/2016  . Overweight (BMI 25.0-29.9) 06/14/2016  . Sinusitis, chronic 08/12/2015  . Arthralgia 07/06/2015  . Chronic back pain 06/10/2015  . Multiple duodenal ulcers 06/10/2015  . Hyperlipidemia 05/07/2015  . Migraines 04/21/2015  . Essential hypertension 02/16/2015  . Impingement syndrome of shoulder 08/07/2014  . 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 Arthritis   . GERD (gastroesophageal reflux disease)   . Hyperlipidemia   . Hypertension   . Migraines   . Wears glasses     Family History  Problem Relation Age of Onset  . Arthritis Mother   . Hyperlipidemia Mother   . Hypertension Father   . Cancer Father   . Skin cancer Father   . Colon cancer Maternal Grandmother   . Cancer Maternal Grandmother   . Hypertension Sister   . Hypertension Paternal Grandmother   . Heart attack Paternal Grandfather   . Heart disease Paternal Grandfather   . Diabetes Neg Hx   . Stroke Neg Hx   . Prostate cancer Neg Hx     Past Surgical History:  Procedure Laterality  Date  . APPENDECTOMY  1980s   post op bleeding sent him back for ex lap and hemostatic intervention.   Marland Kitchen BACK SURGERY     8 surgeries total - fused from L1-S1  . COLONOSCOPY     Age 12. For bleeding  . ESOPHAGOGASTRODUODENOSCOPY N/A 06/10/2015   Procedure: ESOPHAGOGASTRODUODENOSCOPY (EGD);  Surgeon: Lafayette Dragon, MD;  Location: [facility] ENDOSCOPY;  Service: Endoscopy;  Laterality: N/A;  . ex lap with peri gastric vessel ligation  1990s   after retching, he rupture a blood vessel "outside" the stomach.  vessel was ligated per pt's descriiption.   Marland Kitchen  INCISIONAL HERNIA REPAIR Right 10/21/2016   Procedure: OPEN FLANK HERNIA REPAIR WITH MESH;  Surgeon: Ralene Ok, MD;  Location: [facility];  Service: General;  Laterality: Right;  . INSERTION OF MESH Right 10/21/2016   Procedure: INSERTION OF MESH;  Surgeon: Ralene Ok, MD;  Location: [facility];  Service: General;  Laterality: Right;  . KNEE SURGERY     Multiple bilateral scopes  . RETINAL DETACHMENT SURGERY Right   . SHOULDER SURGERY     4x on the left - total shoulder replacement.   Marland Kitchen SHOULDER SURGERY Left    x2  . TOTAL SHOULDER ARTHROPLASTY Left    x2   Social History   Occupational History  . Occupation: Disability    Comment: Former Therapist, sports  Tobacco Use  . Smoking status: Former Smoker    Years: 24.00    Types: Cigarettes  . Smokeless tobacco: Never Used  Vaping Use  . Vaping Use: Never used  Substance and Sexual Activity  . Alcohol use: Not Currently    Alcohol/week: 0.0 standard drinks  . Drug use: No  . Sexual activity: Not on file

## 2021-01-12 ENCOUNTER — Telehealth: Payer: Self-pay

## 2021-01-12 NOTE — Telephone Encounter (Signed)
LVM for pt to call me back to schedule sleep study  

## 2021-01-14 ENCOUNTER — Telehealth: Payer: Self-pay

## 2021-01-14 NOTE — Telephone Encounter (Signed)
LVM for pt to call me back to schedule sleep study  

## 2021-01-18 ENCOUNTER — Telehealth: Payer: Self-pay

## 2021-01-18 NOTE — Telephone Encounter (Signed)
LVM for pt to call me back to schedule sleep study  

## 2021-01-25 DIAGNOSIS — G5761 Lesion of plantar nerve, right lower limb: Secondary | ICD-10-CM | POA: Diagnosis not present

## 2021-01-25 DIAGNOSIS — Z9889 Other specified postprocedural states: Secondary | ICD-10-CM | POA: Diagnosis not present

## 2021-01-28 ENCOUNTER — Ambulatory Visit: Payer: Medicare Other | Admitting: Specialist

## 2021-02-03 DIAGNOSIS — M79671 Pain in right foot: Secondary | ICD-10-CM | POA: Diagnosis not present

## 2021-02-03 DIAGNOSIS — M47818 Spondylosis without myelopathy or radiculopathy, sacral and sacrococcygeal region: Secondary | ICD-10-CM | POA: Diagnosis not present

## 2021-02-03 DIAGNOSIS — M7741 Metatarsalgia, right foot: Secondary | ICD-10-CM | POA: Diagnosis not present

## 2021-02-03 DIAGNOSIS — M62830 Muscle spasm of back: Secondary | ICD-10-CM | POA: Diagnosis not present

## 2021-02-09 ENCOUNTER — Ambulatory Visit (INDEPENDENT_AMBULATORY_CARE_PROVIDER_SITE_OTHER): Payer: Medicare Other | Admitting: Neurology

## 2021-02-09 DIAGNOSIS — R0683 Snoring: Secondary | ICD-10-CM

## 2021-02-09 DIAGNOSIS — Z820 Family history of epilepsy and other diseases of the nervous system: Secondary | ICD-10-CM

## 2021-02-09 DIAGNOSIS — G4733 Obstructive sleep apnea (adult) (pediatric): Secondary | ICD-10-CM

## 2021-02-09 DIAGNOSIS — H43813 Vitreous degeneration, bilateral: Secondary | ICD-10-CM | POA: Diagnosis not present

## 2021-02-09 DIAGNOSIS — H31093 Other chorioretinal scars, bilateral: Secondary | ICD-10-CM | POA: Diagnosis not present

## 2021-02-09 DIAGNOSIS — H33321 Round hole, right eye: Secondary | ICD-10-CM | POA: Diagnosis not present

## 2021-02-09 DIAGNOSIS — R351 Nocturia: Secondary | ICD-10-CM

## 2021-02-09 DIAGNOSIS — H35372 Puckering of macula, left eye: Secondary | ICD-10-CM | POA: Diagnosis not present

## 2021-02-09 DIAGNOSIS — Z79891 Long term (current) use of opiate analgesic: Secondary | ICD-10-CM

## 2021-02-09 DIAGNOSIS — E663 Overweight: Secondary | ICD-10-CM

## 2021-02-15 ENCOUNTER — Encounter: Payer: Self-pay | Admitting: Specialist

## 2021-02-18 NOTE — Progress Notes (Signed)
Patient referred by Dr. Junius Roads, seen by me on 01/06/21, HST on 02/10/21.    Please call and notify the patient that the recent home sleep test showed rather mild obstructive sleep apnea, with an apnea/hypopnea score of 8.4/h, O2 nadir 90%.  We can consider AutoPap therapy if he would like to try treatment, especially since his snoring can be bothersome to his significant other.  Other treatment options may include some weight loss and avoiding the supine or back sleep position and a dental device.  Let me know if you would like an order for AutoPap therapy.  If you would like to talk to his dentist, he can certainly look into that as well as treatment with an oral appliance is certainly also an option for mild sleep apnea.  If you would like a referral to dentistry, we can also facilitate that. Let me know.   [name], MD, PhD [facility])

## 2021-02-18 NOTE — Procedures (Signed)
   [facility] (Watch PAT)  STUDY DATE: 02/10/21  DOB: 09-Feb-1966  MRN: [id number]  ORDERING CLINICIAN: [name], MD, PhD   REFERRING CLINICIAN: Hilts, Legrand Como, MD   CLINICAL INFORMATION/HISTORY: 55 year old man with a history of hypertension, migraine headaches, elevated LFTs, hyperlipidemia, prior smoking, testosterone deficiency, vitamin D deficiency, ADHD, arthritis, reflux disease, and overweight state, chronic pain, on chronic narcotic pain medication, status post multiple surgeries, who reports snoring and sleep disruption.   Epworth sleepiness score: 7/24.  BMI: 28.1 kg/m  Neck Circumference: 17 7/8 "  FINDINGS:   Total Record Time (hours, min): 10 H 1 min  Total Sleep Time (hours, min):  8 H 34 min   Percent REM (%):    12.92 %   Calculated pAHI (per hour): 8.4      REM pAHI: 17.3  NREM pAHI: 7.1  Supine AHI: N/A   Oxygen Saturation (%) Mean: 95  Minimum oxygen saturation (%):        90   O2 Saturation Range (%): 90-98  O2Saturation (minutes) <=88%: 0 min  Pulse Mean (bpm):    58  Pulse Range (41-87)   IMPRESSION: OSA (obstructive sleep apnea), mild  RECOMMENDATION:  This home sleep test demonstrates overall mild obstructive sleep apnea with a total AHI of 8.4/hour and O2 nadir of 90%. Given the patient's medical history and sleep related complaints, treatment with positive airway pressure can be considered. This can be achieved in the form of autoPAP trial/titration at home. Other treatment options include weight loss along with avoidance of the supine sleep position, an oral appliance through a qualified dentist, airway surgical procedures (through ENT).   Please note that untreated obstructive sleep apnea may carry additional perioperative morbidity. Patients with significant obstructive sleep apnea should receive perioperative PAP therapy and the surgeons and particularly the anesthesiologist should be informed of the diagnosis and  the severity of the sleep disordered breathing. The patient should be cautioned not to drive, work at heights, or operate dangerous or heavy equipment when tired or sleepy. Review and reiteration of good sleep hygiene measures should be pursued with any patient. Other causes of the patient's symptoms, including circadian rhythm disturbances, an underlying mood disorder, medication effect and/or an underlying medical problem cannot be ruled out based on this test. Clinical correlation is recommended.   The patient and his referring provider will be notified of the test results. The patient will be seen in follow up in sleep clinic at [facility], as needed.  I certify that I have reviewed the raw data recording prior to the issuance of this report in accordance with the standards of the American Academy of Sleep Medicine (AASM).   INTERPRETING PHYSICIAN:  [name], MD, PhD  Board Certified in Neurology and Sleep Medicine  [facility] [address], [place],  [address] [phone]

## 2021-03-01 DIAGNOSIS — H43393 Other vitreous opacities, bilateral: Secondary | ICD-10-CM | POA: Diagnosis not present

## 2021-03-03 DIAGNOSIS — G894 Chronic pain syndrome: Secondary | ICD-10-CM | POA: Diagnosis not present

## 2021-03-03 DIAGNOSIS — M79671 Pain in right foot: Secondary | ICD-10-CM | POA: Diagnosis not present

## 2021-03-03 DIAGNOSIS — M7741 Metatarsalgia, right foot: Secondary | ICD-10-CM | POA: Diagnosis not present

## 2021-03-03 DIAGNOSIS — M62830 Muscle spasm of back: Secondary | ICD-10-CM | POA: Diagnosis not present

## 2021-03-03 DIAGNOSIS — Z79891 Long term (current) use of opiate analgesic: Secondary | ICD-10-CM | POA: Diagnosis not present

## 2021-03-03 DIAGNOSIS — M47818 Spondylosis without myelopathy or radiculopathy, sacral and sacrococcygeal region: Secondary | ICD-10-CM | POA: Diagnosis not present

## 2021-03-09 DIAGNOSIS — H33321 Round hole, right eye: Secondary | ICD-10-CM | POA: Diagnosis not present

## 2021-03-17 DIAGNOSIS — M7651 Patellar tendinitis, right knee: Secondary | ICD-10-CM | POA: Diagnosis not present

## 2021-03-17 DIAGNOSIS — M5136 Other intervertebral disc degeneration, lumbar region: Secondary | ICD-10-CM | POA: Diagnosis not present

## 2021-03-17 DIAGNOSIS — M546 Pain in thoracic spine: Secondary | ICD-10-CM | POA: Diagnosis not present

## 2021-03-17 DIAGNOSIS — R531 Weakness: Secondary | ICD-10-CM | POA: Diagnosis not present

## 2021-03-22 ENCOUNTER — Encounter: Payer: Self-pay | Admitting: Family Medicine

## 2021-03-22 MED ORDER — FLUTICASONE PROPIONATE 50 MCG/ACT NA SUSP: 2.0000 | Freq: Every day | NASAL | 11 refills | Status: DC

## 2021-03-25 DIAGNOSIS — M546 Pain in thoracic spine: Secondary | ICD-10-CM | POA: Diagnosis not present

## 2021-03-25 DIAGNOSIS — M7651 Patellar tendinitis, right knee: Secondary | ICD-10-CM | POA: Diagnosis not present

## 2021-03-25 DIAGNOSIS — R531 Weakness: Secondary | ICD-10-CM | POA: Diagnosis not present

## 2021-03-25 DIAGNOSIS — M5136 Other intervertebral disc degeneration, lumbar region: Secondary | ICD-10-CM | POA: Diagnosis not present

## 2021-03-30 DIAGNOSIS — R531 Weakness: Secondary | ICD-10-CM | POA: Diagnosis not present

## 2021-03-30 DIAGNOSIS — M546 Pain in thoracic spine: Secondary | ICD-10-CM | POA: Diagnosis not present

## 2021-03-30 DIAGNOSIS — M7651 Patellar tendinitis, right knee: Secondary | ICD-10-CM | POA: Diagnosis not present

## 2021-03-30 DIAGNOSIS — M5136 Other intervertebral disc degeneration, lumbar region: Secondary | ICD-10-CM | POA: Diagnosis not present

## 2021-03-31 ENCOUNTER — Other Ambulatory Visit: Payer: Self-pay | Admitting: Family Medicine

## 2021-03-31 DIAGNOSIS — M47818 Spondylosis without myelopathy or radiculopathy, sacral and sacrococcygeal region: Secondary | ICD-10-CM | POA: Diagnosis not present

## 2021-03-31 DIAGNOSIS — M62830 Muscle spasm of back: Secondary | ICD-10-CM | POA: Diagnosis not present

## 2021-03-31 DIAGNOSIS — M7741 Metatarsalgia, right foot: Secondary | ICD-10-CM | POA: Diagnosis not present

## 2021-03-31 DIAGNOSIS — M79671 Pain in right foot: Secondary | ICD-10-CM | POA: Diagnosis not present

## 2021-04-01 ENCOUNTER — Ambulatory Visit: Payer: Medicare Other | Admitting: Specialist

## 2021-04-06 DIAGNOSIS — M546 Pain in thoracic spine: Secondary | ICD-10-CM | POA: Diagnosis not present

## 2021-04-06 DIAGNOSIS — R531 Weakness: Secondary | ICD-10-CM | POA: Diagnosis not present

## 2021-04-06 DIAGNOSIS — M5136 Other intervertebral disc degeneration, lumbar region: Secondary | ICD-10-CM | POA: Diagnosis not present

## 2021-04-06 DIAGNOSIS — M7651 Patellar tendinitis, right knee: Secondary | ICD-10-CM | POA: Diagnosis not present

## 2021-04-09 ENCOUNTER — Encounter: Payer: Self-pay | Admitting: Family Medicine

## 2021-04-09 DIAGNOSIS — I10 Essential (primary) hypertension: Secondary | ICD-10-CM

## 2021-04-09 MED ORDER — SUMATRIPTAN SUCCINATE 100 MG PO TABS: ORAL_TABLET | ORAL | 11 refills | Status: DC

## 2021-04-09 MED ORDER — AMLODIPINE BESYLATE 10 MG PO TABS: 10.0000 mg | ORAL_TABLET | Freq: Every day | ORAL | 3 refills | Status: DC

## 2021-04-13 DIAGNOSIS — R531 Weakness: Secondary | ICD-10-CM | POA: Diagnosis not present

## 2021-04-13 DIAGNOSIS — M5136 Other intervertebral disc degeneration, lumbar region: Secondary | ICD-10-CM | POA: Diagnosis not present

## 2021-04-13 DIAGNOSIS — M7651 Patellar tendinitis, right knee: Secondary | ICD-10-CM | POA: Diagnosis not present

## 2021-04-13 DIAGNOSIS — M546 Pain in thoracic spine: Secondary | ICD-10-CM | POA: Diagnosis not present

## 2021-04-28 ENCOUNTER — Other Ambulatory Visit: Payer: Self-pay | Admitting: Family Medicine

## 2021-04-28 DIAGNOSIS — M62830 Muscle spasm of back: Secondary | ICD-10-CM | POA: Diagnosis not present

## 2021-04-28 DIAGNOSIS — M47818 Spondylosis without myelopathy or radiculopathy, sacral and sacrococcygeal region: Secondary | ICD-10-CM | POA: Diagnosis not present

## 2021-04-28 DIAGNOSIS — G894 Chronic pain syndrome: Secondary | ICD-10-CM | POA: Diagnosis not present

## 2021-04-28 DIAGNOSIS — Z79891 Long term (current) use of opiate analgesic: Secondary | ICD-10-CM | POA: Diagnosis not present

## 2021-05-18 DIAGNOSIS — H43813 Vitreous degeneration, bilateral: Secondary | ICD-10-CM | POA: Diagnosis not present

## 2021-05-18 DIAGNOSIS — H31093 Other chorioretinal scars, bilateral: Secondary | ICD-10-CM | POA: Diagnosis not present

## 2021-05-18 DIAGNOSIS — H35372 Puckering of macula, left eye: Secondary | ICD-10-CM | POA: Diagnosis not present

## 2021-05-26 DIAGNOSIS — G894 Chronic pain syndrome: Secondary | ICD-10-CM | POA: Diagnosis not present

## 2021-05-26 DIAGNOSIS — Z79891 Long term (current) use of opiate analgesic: Secondary | ICD-10-CM | POA: Diagnosis not present

## 2021-05-26 DIAGNOSIS — M47818 Spondylosis without myelopathy or radiculopathy, sacral and sacrococcygeal region: Secondary | ICD-10-CM | POA: Diagnosis not present

## 2021-05-26 DIAGNOSIS — M62830 Muscle spasm of back: Secondary | ICD-10-CM | POA: Diagnosis not present

## 2021-05-31 DIAGNOSIS — Z23 Encounter for immunization: Secondary | ICD-10-CM | POA: Diagnosis not present

## 2021-06-02 ENCOUNTER — Encounter: Payer: Self-pay | Admitting: Family Medicine

## 2021-06-02 ENCOUNTER — Ambulatory Visit (INDEPENDENT_AMBULATORY_CARE_PROVIDER_SITE_OTHER): Payer: Medicare Other | Admitting: Family Medicine

## 2021-06-02 ENCOUNTER — Other Ambulatory Visit: Payer: Self-pay

## 2021-06-02 DIAGNOSIS — Z7185 Encounter for immunization safety counseling: Secondary | ICD-10-CM

## 2021-06-02 NOTE — Progress Notes (Signed)
Office Visit Note   Patient: Jeremiah Anderson           Date of Birth: 26-Jul-1966           MRN: [id number] Visit Date: 06/02/2021 Requested by: Eunice Blase, MD [address] [place],  [place] [address] PCP: Eunice Blase, MD  Subjective: Chief Complaint  Patient presents with   Other    Needs paperwork filled out for nursing refresher course.    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  He needs his immunization records.  He is feeling well overall, working with pain management to decrease his dosage every 2 months.              ROS:   All other systems were reviewed and are negative.  Objective: Vital Signs: There were no vitals taken for this visit.  Physical Exam:  General:  Alert and oriented, in no acute distress. Pulm:  Breathing unlabored. Psy:  Normal mood, congruent affect.  CV: Regular rate and rhythm without murmurs, rubs, or gallops.  No peripheral edema.  2+ radial and posterior tibial pulses. Lungs: Clear to auscultation throughout with no wheezing or areas of consolidation.    Imaging: No results found.  Assessment & Plan: Preemployment clearance -Paperwork signed today.  Vaccine records updated.  Return as needed.     Procedures: No procedures performed        PMFS History: Patient Active Problem List   Diagnosis Date Noted   Testosterone deficiency 11/04/2020   Vitamin D deficiency 11/04/2020   Attention deficit hyperactivity disorder (ADHD), combined type 11/04/2020   Elevated LFTs 03/23/2020   Fusion of spine of lumbar region 10/31/2019   Spondylolysis of cervical region 10/31/2019   Chronic, continuous use of opioids 07/17/2019   Facet arthropathy, cervical 07/17/2019   Neck pain 12/17/2017   Patellar tendinitis of right knee 08/18/2017   Medicare annual wellness visit, subsequent 01/27/2017   Abscess of nasal cavity 06/14/2016   Right flank mass 06/14/2016   Tobacco use disorder 06/14/2016   Overweight (BMI  25.0-29.9) 06/14/2016   Sinusitis, chronic 08/12/2015   Arthralgia 07/06/2015   Chronic back pain 06/10/2015   Multiple duodenal ulcers 06/10/2015   Hyperlipidemia 05/07/2015   Migraines 04/21/2015   Essential hypertension 02/16/2015   Impingement syndrome of shoulder 08/07/2014   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   Past Medical History:  Diagnosis Date   Arthritis    GERD (gastroesophageal reflux disease)    Hyperlipidemia    Hypertension    Migraines    Wears glasses     Family History  Problem Relation Age of Onset   Arthritis Mother    Hyperlipidemia Mother    Hypertension Father    Cancer Father    Skin cancer Father    Colon cancer Maternal Grandmother    Cancer Maternal Grandmother    Hypertension Sister    Hypertension Paternal Grandmother    Heart attack Paternal Grandfather    Heart disease Paternal Grandfather    Diabetes Neg Hx    Stroke Neg Hx    Prostate cancer Neg Hx     Past Surgical History:  Procedure Laterality Date   APPENDECTOMY  1980s   post op bleeding sent him back for ex lap and hemostatic intervention.    BACK SURGERY     8 surgeries total - fused from L1-S1   COLONOSCOPY     Age 89. For bleeding   ESOPHAGOGASTRODUODENOSCOPY N/A 06/10/2015   Procedure:  ESOPHAGOGASTRODUODENOSCOPY (EGD);  Surgeon: Lafayette Dragon, MD;  Location: [facility] ENDOSCOPY;  Service: Endoscopy;  Laterality: N/A;   ex lap with peri gastric vessel ligation  1990s   after retching, he rupture a blood vessel "outside" the stomach.  vessel was ligated per pt's descriiption.    INCISIONAL HERNIA REPAIR Right 10/21/2016   Procedure: OPEN FLANK HERNIA REPAIR WITH MESH;  Surgeon: Ralene Ok, MD;  Location: [facility];  Service: General;  Laterality: Right;   INSERTION OF MESH Right 10/21/2016   Procedure: INSERTION OF MESH;  Surgeon: Ralene Ok, MD;  Location: [facility];  Service: General;  Laterality: Right;   KNEE SURGERY     Multiple bilateral scopes   RETINAL DETACHMENT SURGERY  Right    SHOULDER SURGERY     4x on the left - total shoulder replacement.    SHOULDER SURGERY Left    x2   TOTAL SHOULDER ARTHROPLASTY Left    x2   Social History   Occupational History   Occupation: Disability    Comment: Former Therapist, sports  Tobacco Use   Smoking status: Former    Years: 24.00    Pack years: 0.00    Types: Cigarettes   Smokeless tobacco: Never  Vaping Use   Vaping Use: Never used  Substance and Sexual Activity   Alcohol use: Not Currently    Alcohol/week: 0.0 standard drinks   Drug use: No   Sexual activity: Not on file

## 2021-06-23 ENCOUNTER — Ambulatory Visit: Payer: Medicare Other | Admitting: Specialist

## 2021-06-23 DIAGNOSIS — G894 Chronic pain syndrome: Secondary | ICD-10-CM | POA: Diagnosis not present

## 2021-06-23 DIAGNOSIS — M62830 Muscle spasm of back: Secondary | ICD-10-CM | POA: Diagnosis not present

## 2021-06-23 DIAGNOSIS — M47818 Spondylosis without myelopathy or radiculopathy, sacral and sacrococcygeal region: Secondary | ICD-10-CM | POA: Diagnosis not present

## 2021-06-23 DIAGNOSIS — Z79891 Long term (current) use of opiate analgesic: Secondary | ICD-10-CM | POA: Diagnosis not present

## 2021-07-09 ENCOUNTER — Other Ambulatory Visit: Payer: Self-pay | Admitting: Family Medicine

## 2021-07-22 ENCOUNTER — Ambulatory Visit: Payer: Medicare Other | Admitting: Specialist

## 2021-09-22 DIAGNOSIS — M5136 Other intervertebral disc degeneration, lumbar region: Secondary | ICD-10-CM | POA: Diagnosis not present

## 2021-09-22 DIAGNOSIS — M7651 Patellar tendinitis, right knee: Secondary | ICD-10-CM | POA: Diagnosis not present

## 2021-09-22 DIAGNOSIS — R531 Weakness: Secondary | ICD-10-CM | POA: Diagnosis not present

## 2021-09-22 DIAGNOSIS — M546 Pain in thoracic spine: Secondary | ICD-10-CM | POA: Diagnosis not present

## 2021-09-28 DIAGNOSIS — M7651 Patellar tendinitis, right knee: Secondary | ICD-10-CM | POA: Diagnosis not present

## 2021-09-28 DIAGNOSIS — R531 Weakness: Secondary | ICD-10-CM | POA: Diagnosis not present

## 2021-09-28 DIAGNOSIS — M546 Pain in thoracic spine: Secondary | ICD-10-CM | POA: Diagnosis not present

## 2021-09-28 DIAGNOSIS — M5136 Other intervertebral disc degeneration, lumbar region: Secondary | ICD-10-CM | POA: Diagnosis not present

## 2021-10-04 DIAGNOSIS — M546 Pain in thoracic spine: Secondary | ICD-10-CM | POA: Diagnosis not present

## 2021-10-04 DIAGNOSIS — M7651 Patellar tendinitis, right knee: Secondary | ICD-10-CM | POA: Diagnosis not present

## 2021-10-04 DIAGNOSIS — R531 Weakness: Secondary | ICD-10-CM | POA: Diagnosis not present

## 2021-10-04 DIAGNOSIS — M5136 Other intervertebral disc degeneration, lumbar region: Secondary | ICD-10-CM | POA: Diagnosis not present

## 2021-10-11 ENCOUNTER — Other Ambulatory Visit: Payer: Self-pay

## 2021-10-11 ENCOUNTER — Encounter: Payer: Self-pay | Admitting: Physician Assistant

## 2021-10-11 ENCOUNTER — Ambulatory Visit (INDEPENDENT_AMBULATORY_CARE_PROVIDER_SITE_OTHER): Payer: Medicare Other | Admitting: Physician Assistant

## 2021-10-11 VITALS — BP 137/88 | HR 67 | Temp 98.0°F | Ht 69.0 in | Wt 195.4 lb

## 2021-10-11 DIAGNOSIS — M5136 Other intervertebral disc degeneration, lumbar region: Secondary | ICD-10-CM | POA: Diagnosis not present

## 2021-10-11 DIAGNOSIS — F1190 Opioid use, unspecified, uncomplicated: Secondary | ICD-10-CM

## 2021-10-11 DIAGNOSIS — E782 Mixed hyperlipidemia: Secondary | ICD-10-CM | POA: Diagnosis not present

## 2021-10-11 DIAGNOSIS — I700 Atherosclerosis of aorta: Secondary | ICD-10-CM | POA: Diagnosis not present

## 2021-10-11 DIAGNOSIS — J328 Other chronic sinusitis: Secondary | ICD-10-CM

## 2021-10-11 DIAGNOSIS — E349 Endocrine disorder, unspecified: Secondary | ICD-10-CM

## 2021-10-11 DIAGNOSIS — I10 Essential (primary) hypertension: Secondary | ICD-10-CM

## 2021-10-11 DIAGNOSIS — M546 Pain in thoracic spine: Secondary | ICD-10-CM | POA: Diagnosis not present

## 2021-10-11 DIAGNOSIS — M7651 Patellar tendinitis, right knee: Secondary | ICD-10-CM | POA: Diagnosis not present

## 2021-10-11 DIAGNOSIS — R531 Weakness: Secondary | ICD-10-CM | POA: Diagnosis not present

## 2021-10-11 NOTE — Progress Notes (Signed)
Subjective:    Patient ID: Jeremiah Anderson, male    DOB: February 08, 1966, 55 y.o.   MRN: [id number]  Chief Complaint  Patient presents with   Establish Care    HPI 55 y.o. patient presents today for new patient establishment with me.  Patient was previously established with Dr. Eunice Blase.  Current Care Team: Dr. Hardin Negus - [facility]; hx of numerous (20+) orthopedic surgeries. Previously played football and pole-vaulted in college.   Dermatology annually - says he grew up in [place] & this is important to him for skin checks and adult acne (Tazorac gel)  Urology - manages his Testosterone injections   Acute Concerns: Needs PCP to manage his Crestor, Zofran, Flonase, and Amlodipine.   Chronic Concerns: See PMH listed below, as well as A/P for details on issues we specifically discussed during today's visit.      Past Medical History:  Diagnosis Date   Allergy    Arthritis    Blood transfusion without reported diagnosis    GERD (gastroesophageal reflux disease)    Hyperlipidemia    Hypertension    Migraines    Wears glasses     Past Surgical History:  Procedure Laterality Date   APPENDECTOMY  1980s   post op bleeding sent him back for ex lap and hemostatic intervention.    BACK SURGERY     8 surgeries total - fused from L1-S1   COLONOSCOPY     Age 75. For bleeding   ESOPHAGOGASTRODUODENOSCOPY N/A 06/10/2015   Procedure: ESOPHAGOGASTRODUODENOSCOPY (EGD);  Surgeon: Lafayette Dragon, MD;  Location: [facility] ENDOSCOPY;  Service: Endoscopy;  Laterality: N/A;   ex lap with peri gastric vessel ligation  1990s   after retching, he rupture a blood vessel "outside" the stomach.  vessel was ligated per pt's descriiption.    INCISIONAL HERNIA REPAIR Right 10/21/2016   Procedure: OPEN FLANK HERNIA REPAIR WITH MESH;  Surgeon: Ralene Ok, MD;  Location: [facility];  Service: General;  Laterality: Right;   INSERTION OF MESH Right 10/21/2016   Procedure: INSERTION OF MESH;   Surgeon: Ralene Ok, MD;  Location: [facility];  Service: General;  Laterality: Right;   KNEE SURGERY     Multiple bilateral scopes   RETINAL DETACHMENT SURGERY Right    SHOULDER SURGERY     4x on the left - total shoulder replacement.    SHOULDER SURGERY Left    x2   TOTAL SHOULDER ARTHROPLASTY Left    x2    Family History  Problem Relation Age of Onset   Arthritis Mother    Hyperlipidemia Mother    Hypertension Father    Cancer Father    Skin cancer Father    Colon cancer Maternal Grandmother    Cancer Maternal Grandmother    Hypertension Sister    Hypertension Paternal Grandmother    Heart attack Paternal Grandfather    Heart disease Paternal Grandfather    Diabetes Neg Hx    Stroke Neg Hx    Prostate cancer Neg Hx     Social History   Tobacco Use   Smoking status: Former    Years: 24.00    Types: Cigarettes   Smokeless tobacco: Never  Vaping Use   Vaping Use: Never used  Substance Use Topics   Alcohol use: Not Currently    Alcohol/week: 0.0 standard drinks   Drug use: No     Allergies  Allergen Reactions   Fentanyl Other (See Comments)    Hallucinations (with the patch)  Lisinopril Swelling   Lyrica [Pregabalin]     hallucinations   Sulfa Antibiotics Swelling    Review of Systems NEGATIVE UNLESS OTHERWISE INDICATED IN HPI      Objective:     BP 137/88   Pulse 67   Temp 98 F (36.7 C)   Ht 5\' 9"  (1.753 m)   Wt 195 lb 6.1 oz (88.6 kg)   SpO2 98%   BMI 28.85 kg/m   Wt Readings from Last 3 Encounters:  10/11/21 195 lb 6.1 oz (88.6 kg)  01/07/21 190 lb (86.2 kg)  01/06/21 190 lb 5 oz (86.3 kg)    BP Readings from Last 3 Encounters:  10/11/21 137/88  01/07/21 (!) 143/88  01/06/21 (!) 134/92     Physical Exam Vitals and nursing note reviewed.  Constitutional:      General: He is not in acute distress.    Appearance: Normal appearance. He is not toxic-appearing.  HENT:     Head: Normocephalic and atraumatic.     Right Ear:  External ear normal.     Left Ear: External ear normal.     Nose: Nose normal.     Mouth/Throat:     Mouth: Mucous membranes are moist.     Pharynx: Oropharynx is clear.  Eyes:     Extraocular Movements: Extraocular movements intact.     Conjunctiva/sclera: Conjunctivae normal.     Pupils: Pupils are equal, round, and reactive to light.  Cardiovascular:     Rate and Rhythm: Normal rate and regular rhythm.     Pulses: Normal pulses.     Heart sounds: Normal heart sounds.  Pulmonary:     Effort: Pulmonary effort is normal.     Breath sounds: Normal breath sounds.  Musculoskeletal:     Cervical back: Normal range of motion and neck supple.     Right lower leg: No edema.     Left lower leg: No edema.  Skin:    General: Skin is warm and dry.  Neurological:     General: No focal deficit present.     Mental Status: He is alert and oriented to person, place, and time.  Psychiatric:        Mood and Affect: Mood normal.        Behavior: Behavior normal.     Comments: Excessive movements with hands, feet, legs; able to stop spontaneously. Very pleasant, but rapid speech.       Assessment & Plan:   Problem List Items Addressed This Visit       Cardiovascular and Mediastinum   Essential hypertension     Respiratory   Sinusitis, chronic     Other   Hyperlipidemia   Chronic, continuous use of opioids   Testosterone deficiency   Other Visit Diagnoses     Aortic atherosclerosis ([place])    -  Primary      1. Aortic atherosclerosis ([place]) 2. Mixed hyperlipidemia Currently taking Crestor 20 mg daily without any problems.  Need to recheck lipid panel at next physical exam.  3. Essential hypertension Blood pressure is currently to goal.  He is taking amlodipine 10 mg without any problems.  He will call for refills.  Low-salt diet and exercise.  4. Chronic, continuous use of opioids He is followed by Dr. Hardin Negus for his chronic morphine tablets.  This does cause occasional  nausea and he takes Zofran 4 mg as needed, which he will call for refills from me as needed.  5. Testosterone deficiency  He is followed by urology and doing well with testosterone injections.  6. Other chronic sinusitis Uses Flonase and nasal saline rinses.   Marua Qin M Melysa Schroyer, PA-C

## 2021-10-11 NOTE — Patient Instructions (Addendum)
Continue regular f/up with specialists. Call for refills as needed. We'll schedule for CPE soon.

## 2021-10-13 DIAGNOSIS — M25552 Pain in left hip: Secondary | ICD-10-CM | POA: Diagnosis not present

## 2021-10-13 DIAGNOSIS — M47818 Spondylosis without myelopathy or radiculopathy, sacral and sacrococcygeal region: Secondary | ICD-10-CM | POA: Diagnosis not present

## 2021-10-13 DIAGNOSIS — M62830 Muscle spasm of back: Secondary | ICD-10-CM | POA: Diagnosis not present

## 2021-10-13 DIAGNOSIS — G894 Chronic pain syndrome: Secondary | ICD-10-CM | POA: Diagnosis not present

## 2021-10-16 ENCOUNTER — Other Ambulatory Visit: Payer: Self-pay | Admitting: Urology

## 2021-10-20 DIAGNOSIS — E230 Hypopituitarism: Secondary | ICD-10-CM | POA: Diagnosis not present

## 2021-10-21 DIAGNOSIS — R531 Weakness: Secondary | ICD-10-CM | POA: Diagnosis not present

## 2021-10-21 DIAGNOSIS — M7651 Patellar tendinitis, right knee: Secondary | ICD-10-CM | POA: Diagnosis not present

## 2021-10-21 DIAGNOSIS — M546 Pain in thoracic spine: Secondary | ICD-10-CM | POA: Diagnosis not present

## 2021-10-21 DIAGNOSIS — M5136 Other intervertebral disc degeneration, lumbar region: Secondary | ICD-10-CM | POA: Diagnosis not present

## 2021-10-29 ENCOUNTER — Telehealth: Payer: Self-pay

## 2021-10-29 NOTE — Telephone Encounter (Signed)
Patient Name: Jeremiah Anderson Gender: Male DOB: Aug 18, 1966 Age: 55 Y 81 M 29 D Return Phone Number: [phone] (Primary) Address: City/ State/ Zip: [place]  [address] Client [facility] Client Site [facility] Paediatric nurse, Freight forwarder- PA Contact Type Call Who Is Calling Patient / Member / Family / Caregiver Call Type Triage / Clinical Relationship To Patient Self Return Phone Number [phone] (Primary) Chief Complaint Dizziness Reason for Call Symptomatic / Request for Central states, pt has dizzy spells, high bp 135/83, and concerns for blacking out. Translation No Nurse Assessment Nurse: Raenette Rover, RN, Zella Ball Date/Time (Eastern Time): 10/29/2021 9:20:07 AM Confirm and document reason for call. If symptomatic, describe symptoms. ---Caller states, pt has dizzy spells, and concerns for a head rush happened twice over the past few days and then again yesterday several times. Started taking his blood pressure when he had the head rush last was 135/83 so didn't think it was related to the blood pressure. Has been having these head rushes often. Does the patient have any new or worsening symptoms? ---Yes Will a triage be completed? ---Yes Related visit to physician within the last 2 weeks? ---No Does the PT have any chronic conditions? (i.e. diabetes, asthma, this includes High risk factors for pregnancy, etc.) ---Yes List chronic conditions. ---chronic pain management, htn, Is this a behavioral health or substance abuse call? ---No Guidelines Guideline Title Affirmed Question Affirmed Notes Nurse Date/Time (Eastern Time) Dizziness - Lightheadedness [1] MODERATE dizziness (e.g., interferes with normal activities) AND [2] has NOT been evaluated by Raenette Rover, RN, Zella Ball 10/29/2021 9:24:13 AM PLEASE NOTE: All timestamps contained within this report are  represented as [place] Standard Time. CONFIDENTIALTY NOTICE: This fax transmission is intended only for the addressee. It contains information that is legally privileged, confidential or otherwise protected from use or disclosure. If you are not the intended recipient, you are strictly prohibited from reviewing, disclosing, copying using or disseminating any of this information or taking any action in reliance on or regarding this information. If you have received this fax in error, please notify us immediately by telephone so that we can arrange for its return to [place]. Phone: [phone], Toll-Free: [phone], Fax: [phone] Page: 2 of 2 Call Id: [id number] Guidelines Guideline Title Affirmed Question Affirmed Notes Nurse Date/Time Eilene Ghazi Time) physician for this (Exception: dizziness caused by heat exposure, sudden standing, or poor fluid intake) Disp. Time Eilene Ghazi Time) Disposition Final User 10/29/2021 9:30:05 AM See PCP within 24 Hours Yes Raenette Rover, RN, Herbert Deaner Disagree/Comply Comply Caller Understands Yes PreDisposition InappropriateToAsk Care Advice Given Per Guideline SEE PCP WITHIN 24 HOURS: * Drink several glasses of fruit juice, other clear fluids or water. * This will improve hydration and blood glucose. * Lie down with feet elevated for 1 hour. * Passes out (faints) * You become worse CALL BACK IF: CARE ADVICE given per Dizziness (Adult) guideline. Comments User: Wilson Singer, RN Date/Time Eilene Ghazi Time): 10/29/2021 9:25:37 AM did a 125 sit ups last night and rode his bike it hasn't stopped him in any way. Referrals REFERRED TO PCP OFFICE

## 2021-10-29 NOTE — Telephone Encounter (Signed)
Left detailed message for patient to call clinic to get him scheduled for a appt

## 2021-10-29 NOTE — Telephone Encounter (Signed)
Pt called wanting an appt today. Pt stated that he has been feeling dizzy since Monday. He stated yesterday he had about 20 dizzy spells. He stated that he is scared to drive due to the spells and he thinks he may black out. I informed pt that he needs to be triaged before scheduling. Pt was refusing to be triaged. Isla Pence was able to get pt triaged. Pt called back after being triaged to be seen today. I spoke with [place] and Alyssa. Alyssa stated that she would feel better if he would be evaluated at the ED. I informed pt and he stated that he was not going to the ED because he is fine and is not dying.

## 2021-11-04 ENCOUNTER — Ambulatory Visit (INDEPENDENT_AMBULATORY_CARE_PROVIDER_SITE_OTHER): Payer: Medicare Other | Admitting: Physician Assistant

## 2021-11-04 ENCOUNTER — Other Ambulatory Visit: Payer: Self-pay

## 2021-11-04 ENCOUNTER — Encounter: Payer: Self-pay | Admitting: Physician Assistant

## 2021-11-04 VITALS — Temp 98.2°F | Ht 69.0 in | Wt 189.2 lb

## 2021-11-04 DIAGNOSIS — I700 Atherosclerosis of aorta: Secondary | ICD-10-CM

## 2021-11-04 DIAGNOSIS — I10 Essential (primary) hypertension: Secondary | ICD-10-CM

## 2021-11-04 DIAGNOSIS — E782 Mixed hyperlipidemia: Secondary | ICD-10-CM

## 2021-11-04 DIAGNOSIS — F1190 Opioid use, unspecified, uncomplicated: Secondary | ICD-10-CM

## 2021-11-04 DIAGNOSIS — R55 Syncope and collapse: Secondary | ICD-10-CM | POA: Diagnosis not present

## 2021-11-04 LAB — CBC WITH DIFFERENTIAL/PLATELET
Basophils Absolute: 0 10*3/uL (ref 0.0–0.1)
Basophils Relative: 0.5 % (ref 0.0–3.0)
Eosinophils Absolute: 0.2 10*3/uL (ref 0.0–0.7)
Eosinophils Relative: 3.6 % (ref 0.0–5.0)
HCT: 42 % (ref 39.0–52.0)
Hemoglobin: 14.4 g/dL (ref 13.0–17.0)
Lymphocytes Relative: 26.2 % (ref 12.0–46.0)
Lymphs Abs: 1.6 10*3/uL (ref 0.7–4.0)
MCHC: 34.2 g/dL (ref 30.0–36.0)
MCV: 85.6 fl (ref 78.0–100.0)
Monocytes Absolute: 0.4 10*3/uL (ref 0.1–1.0)
Monocytes Relative: 7.1 % (ref 3.0–12.0)
Neutro Abs: 3.7 10*3/uL (ref 1.4–7.7)
Neutrophils Relative %: 62.6 % (ref 43.0–77.0)
Platelets: 242 10*3/uL (ref 150.0–400.0)
RBC: 4.91 Mil/uL (ref 4.22–5.81)
RDW: 12.9 % (ref 11.5–15.5)
WBC: 5.9 10*3/uL (ref 4.0–10.5)

## 2021-11-04 NOTE — Progress Notes (Signed)
Subjective:    Patient ID: Jeremiah Anderson, male    DOB: January 29, 1966, 55 y.o.   MRN: [id number]  Chief Complaint  Patient presents with   Dizziness    HPI Patient is in today for dizzy spells that occurred last week.  Felt a "head rush" while cooking dinner on Monday (10/25/21).  Thursday (10/28/21) had several "head rush feelings." Happened while laying down or sitting up, position change didn't matter. About 20 events on Thursday. None on Friday. Felt anxious during these events like something worse was going to happen. No CP or SOB or headache during these events. No palpitations. BP was 135/83, 141/93 during two of those episodes. He has a hard type describing these episodes and says it just felt like it came up from his abdomen/ chest area and "rushed" up to his head, then resolved on its own. No N/V. No postictal state. No syncope.  Did not miss doses of any of his medications last week. No new medications.   No prior episodes like this. No hx of seizures. Significant CAD history in family.    Past Medical History:  Diagnosis Date   Allergy    Arthritis    Blood transfusion without reported diagnosis    GERD (gastroesophageal reflux disease)    Hyperlipidemia    Hypertension    Migraines    Wears glasses     Past Surgical History:  Procedure Laterality Date   APPENDECTOMY  1980s   post op bleeding sent him back for ex lap and hemostatic intervention.    BACK SURGERY     8 surgeries total - fused from L1-S1   COLONOSCOPY     Age 64. For bleeding   ESOPHAGOGASTRODUODENOSCOPY N/A 06/10/2015   Procedure: ESOPHAGOGASTRODUODENOSCOPY (EGD);  Surgeon: Lafayette Dragon, MD;  Location: [facility] ENDOSCOPY;  Service: Endoscopy;  Laterality: N/A;   ex lap with peri gastric vessel ligation  1990s   after retching, he rupture a blood vessel "outside" the stomach.  vessel was ligated per pt's descriiption.    INCISIONAL HERNIA REPAIR Right 10/21/2016   Procedure: OPEN FLANK HERNIA REPAIR WITH  MESH;  Surgeon: Ralene Ok, MD;  Location: [place];  Service: General;  Laterality: Right;   INSERTION OF MESH Right 10/21/2016   Procedure: INSERTION OF MESH;  Surgeon: Ralene Ok, MD;  Location: [place];  Service: General;  Laterality: Right;   KNEE SURGERY     Multiple bilateral scopes   RETINAL DETACHMENT SURGERY Right    SHOULDER SURGERY     4x on the left - total shoulder replacement.    SHOULDER SURGERY Left    x2   TOTAL SHOULDER ARTHROPLASTY Left    x2    Family History  Problem Relation Age of Onset   Arthritis Mother    Hyperlipidemia Mother    Hypertension Father    Cancer Father    Skin cancer Father    Colon cancer Maternal Grandmother    Cancer Maternal Grandmother    Hypertension Sister    Hypertension Paternal Grandmother    Heart attack Paternal Grandfather    Heart disease Paternal Grandfather    Diabetes Neg Hx    Stroke Neg Hx    Prostate cancer Neg Hx     Social History   Tobacco Use   Smoking status: Former    Years: 24.00    Types: Cigarettes   Smokeless tobacco: Never  Vaping Use   Vaping Use: Never used  Substance Use Topics  Alcohol use: Not Currently    Alcohol/week: 0.0 standard drinks   Drug use: No     Allergies  Allergen Reactions   Fentanyl Other (See Comments)    Hallucinations (with the patch)    Lisinopril Swelling   Lyrica [Pregabalin]     hallucinations   Sulfa Antibiotics Swelling    Review of Systems NEGATIVE UNLESS OTHERWISE INDICATED IN HPI      Objective:     Temp 98.2 F (36.8 C)    Ht 5\' 9"  (1.753 m)    Wt 189 lb 4 oz (85.8 kg)    BMI 27.95 kg/m   Wt Readings from Last 3 Encounters:  11/04/21 189 lb 4 oz (85.8 kg)  10/11/21 195 lb 6.1 oz (88.6 kg)  01/07/21 190 lb (86.2 kg)    BP Readings from Last 3 Encounters:  10/11/21 137/88  01/07/21 (!) 143/88  01/06/21 (!) 134/92     Physical Exam Vitals and nursing note reviewed.  Constitutional:      General: He is not in acute distress.     Appearance: Normal appearance. He is not toxic-appearing.  HENT:     Head: Normocephalic and atraumatic.     Right Ear: External ear normal.     Left Ear: External ear normal.     Nose: Nose normal.     Mouth/Throat:     Mouth: Mucous membranes are moist.     Pharynx: Oropharynx is clear.  Eyes:     Extraocular Movements: Extraocular movements intact.     Conjunctiva/sclera: Conjunctivae normal.     Pupils: Pupils are equal, round, and reactive to light.  Cardiovascular:     Rate and Rhythm: Normal rate and regular rhythm.     Pulses: Normal pulses.     Heart sounds: Normal heart sounds.  Pulmonary:     Effort: Pulmonary effort is normal.     Breath sounds: Normal breath sounds.  Musculoskeletal:     Cervical back: Normal range of motion and neck supple.     Right lower leg: No edema.     Left lower leg: No edema.  Skin:    General: Skin is warm and dry.  Neurological:     General: No focal deficit present.     Mental Status: He is alert and oriented to person, place, and time.     Cranial Nerves: No cranial nerve deficit.     Sensory: No sensory deficit.     Motor: No weakness.     Coordination: Coordination is intact. Romberg sign negative. Coordination normal.     Gait: Gait normal.  Psychiatric:        Mood and Affect: Mood normal.        Behavior: Behavior normal.     Comments: Excessive movements with hands, feet, legs; able to stop spontaneously. Very pleasant, but rapid speech.       Assessment & Plan:   Problem List Items Addressed This Visit       Cardiovascular and Mediastinum   Essential hypertension   Relevant Orders   Comprehensive metabolic panel     Other   Hyperlipidemia   Relevant Orders   Lipid panel   Chronic, continuous use of opioids   Relevant Orders   CBC with Differential/Platelet   Comprehensive metabolic panel   CT HEAD WO CONTRAST (5MM)   Other Visit Diagnoses     Near syncope    -  Primary   Relevant Orders   EKG 12-Lead  (  Completed)   CBC with Differential/Platelet   Comprehensive metabolic panel   TSH   CT HEAD WO CONTRAST (5MM)   Aortic atherosclerosis (HCC)       Relevant Orders   Lipid panel   CT HEAD WO CONTRAST (5MM)        Plan: -Vague symptoms, difficult for him to describe, but these sound like near-syncopal events. Wide differential. Consider referrals to cardiology and neurology to help r/o underlying etiology. Could be 2/2 chronic opioid use as well. Exam benign today.  -Orthostatic vitals fine in office. -EKG per my reading shows NSR at 69 bpm with one random PAC. No T wave abnormalities. Low suspicion, but will refer to cards due to his family history. -Labs in office, will call with results. -Plan for CT head w/o contrast. -Red flags discussed and he knows to present to ED in such event.    This note was prepared with assistance of Systems analyst. Occasional wrong-word or sound-a-like substitutions may have occurred due to the inherent limitations of voice recognition software.  In addition to time spent for ekg, I spent 42 minutes of total time on the date of the encounter performing the following actions: chart review prior to seeing the patient, obtaining history, performing a medically necessary exam, counseling on the treatment plan, placing orders, and documenting in our EHR.     Aniylah Avans M Marlina Cataldi, PA-C

## 2021-11-05 LAB — LIPID PANEL
Cholesterol: 157 mg/dL (ref 0–200)
HDL: 61.6 mg/dL (ref >39.00)
LDL Cholesterol: 79 mg/dL (ref 0–99)
NonHDL: 95.09
Total CHOL/HDL Ratio: 3
Triglycerides: 78 mg/dL (ref 0.0–149.0)
VLDL: 15.6 mg/dL (ref 0.0–40.0)

## 2021-11-05 LAB — COMPREHENSIVE METABOLIC PANEL
ALT: 30 U/L (ref 0–53)
AST: 31 U/L (ref 0–37)
Albumin: 4.6 g/dL (ref 3.5–5.2)
Alkaline Phosphatase: 44 U/L (ref 39–117)
BUN: 15 mg/dL (ref 6–23)
CO2: 30 mEq/L (ref 19–32)
Calcium: 9.8 mg/dL (ref 8.4–10.5)
Chloride: 100 mEq/L (ref 96–112)
Creatinine, Ser: 0.98 mg/dL (ref 0.40–1.50)
GFR: 86.8 mL/min (ref >60.00)
Glucose, Bld: 101 mg/dL — ABNORMAL HIGH (ref 70–99)
Potassium: 4.5 mEq/L (ref 3.5–5.1)
Sodium: 138 mEq/L (ref 135–145)
Total Bilirubin: 0.6 mg/dL (ref 0.2–1.2)
Total Protein: 6.6 g/dL (ref 6.0–8.3)

## 2021-11-05 LAB — TSH: TSH: 0.93 u[IU]/mL (ref 0.35–5.50)

## 2021-11-23 DIAGNOSIS — M7651 Patellar tendinitis, right knee: Secondary | ICD-10-CM | POA: Diagnosis not present

## 2021-11-23 DIAGNOSIS — R531 Weakness: Secondary | ICD-10-CM | POA: Diagnosis not present

## 2021-11-23 DIAGNOSIS — M546 Pain in thoracic spine: Secondary | ICD-10-CM | POA: Diagnosis not present

## 2021-11-23 DIAGNOSIS — M5136 Other intervertebral disc degeneration, lumbar region: Secondary | ICD-10-CM | POA: Diagnosis not present

## 2021-12-01 ENCOUNTER — Ambulatory Visit
Admission: RE | Admit: 2021-12-01 | Discharge: 2021-12-01 | Disposition: A | Discharge status: Home / Self Care | Payer: Medicare Other | Source: Ambulatory Visit | Attending: Physician Assistant | Admitting: Physician Assistant

## 2021-12-01 ENCOUNTER — Telehealth: Payer: Self-pay | Admitting: Physician Assistant

## 2021-12-01 ENCOUNTER — Other Ambulatory Visit: Payer: Self-pay | Admitting: Physician Assistant

## 2021-12-01 DIAGNOSIS — F1190 Opioid use, unspecified, uncomplicated: Secondary | ICD-10-CM

## 2021-12-01 DIAGNOSIS — R55 Syncope and collapse: Secondary | ICD-10-CM

## 2021-12-01 DIAGNOSIS — R930 Abnormal findings on diagnostic imaging of skull and head, not elsewhere classified: Secondary | ICD-10-CM

## 2021-12-01 DIAGNOSIS — I700 Atherosclerosis of aorta: Secondary | ICD-10-CM

## 2021-12-01 DIAGNOSIS — G9389 Other specified disorders of brain: Secondary | ICD-10-CM

## 2021-12-01 DIAGNOSIS — R220 Localized swelling, mass and lump, head: Secondary | ICD-10-CM | POA: Diagnosis not present

## 2021-12-01 DIAGNOSIS — G936 Cerebral edema: Secondary | ICD-10-CM | POA: Diagnosis not present

## 2021-12-01 DIAGNOSIS — Z8679 Personal history of other diseases of the circulatory system: Secondary | ICD-10-CM | POA: Diagnosis not present

## 2021-12-01 MED ORDER — IOPAMIDOL (ISOVUE-300) INJECTION 61%
75.0000 mL | Freq: Once | INTRAVENOUS | Status: AC | PRN
  Administered 2021-12-01: 75 mL via INTRAVENOUS

## 2021-12-01 NOTE — Telephone Encounter (Signed)
Dr Kellie Simmering called wanting to talk to [facility] about a critical finding on Jeremiah Anderson [phone], They said its really important, I sent a secure chat to [place] but I had no response so I messaged Khamika and she let me know that Alyssa was already gone but that if we could get a call back number she would let her know so she could call back as soon as possible. The call back I was given was [phone] and I also sent to [facility] in teams.

## 2021-12-02 ENCOUNTER — Ambulatory Visit (HOSPITAL_COMMUNITY)
Admission: RE | Admit: 2021-12-02 | Discharge: 2021-12-02 | Disposition: A | Discharge status: Home / Self Care | Payer: Medicare Other | Source: Ambulatory Visit | Attending: Physician Assistant | Admitting: Physician Assistant

## 2021-12-02 ENCOUNTER — Other Ambulatory Visit: Payer: Self-pay | Admitting: Physician Assistant

## 2021-12-02 ENCOUNTER — Encounter: Payer: Self-pay | Admitting: Physician Assistant

## 2021-12-02 ENCOUNTER — Other Ambulatory Visit: Payer: Self-pay

## 2021-12-02 DIAGNOSIS — R937 Abnormal findings on diagnostic imaging of other parts of musculoskeletal system: Secondary | ICD-10-CM

## 2021-12-02 DIAGNOSIS — R930 Abnormal findings on diagnostic imaging of skull and head, not elsewhere classified: Secondary | ICD-10-CM | POA: Insufficient documentation

## 2021-12-02 DIAGNOSIS — D320 Benign neoplasm of cerebral meninges: Secondary | ICD-10-CM

## 2021-12-02 DIAGNOSIS — G9389 Other specified disorders of brain: Secondary | ICD-10-CM

## 2021-12-02 DIAGNOSIS — G936 Cerebral edema: Secondary | ICD-10-CM | POA: Diagnosis not present

## 2021-12-02 MED ORDER — GADOBUTROL 1 MMOL/ML IV SOLN
8.5000 mL | Freq: Once | INTRAVENOUS | Status: AC | PRN
  Administered 2021-12-02: 8.5 mL via INTRAVENOUS

## 2021-12-02 NOTE — Telephone Encounter (Signed)
This was taken care of shortly after time of this message, thanks.

## 2021-12-03 ENCOUNTER — Other Ambulatory Visit (HOSPITAL_COMMUNITY): Payer: Medicare Other

## 2021-12-03 ENCOUNTER — Encounter: Payer: Self-pay | Admitting: Physician Assistant

## 2021-12-07 ENCOUNTER — Telehealth: Payer: Self-pay | Admitting: Physician Assistant

## 2021-12-07 NOTE — Telephone Encounter (Signed)
Pt states he needs his results of his most recent CT and MRI scans to be faxed over to [facility].  AttnDerrill Memo Fax: [phone]

## 2021-12-08 DIAGNOSIS — Z79891 Long term (current) use of opiate analgesic: Secondary | ICD-10-CM | POA: Diagnosis not present

## 2021-12-08 DIAGNOSIS — M25552 Pain in left hip: Secondary | ICD-10-CM | POA: Diagnosis not present

## 2021-12-08 DIAGNOSIS — M47818 Spondylosis without myelopathy or radiculopathy, sacral and sacrococcygeal region: Secondary | ICD-10-CM | POA: Diagnosis not present

## 2021-12-08 DIAGNOSIS — G894 Chronic pain syndrome: Secondary | ICD-10-CM | POA: Diagnosis not present

## 2021-12-08 DIAGNOSIS — M62830 Muscle spasm of back: Secondary | ICD-10-CM | POA: Diagnosis not present

## 2021-12-09 NOTE — Telephone Encounter (Signed)
Left message to return call to our office at their convenience.  

## 2021-12-09 NOTE — Telephone Encounter (Signed)
CT and MRI faxed to [phone]

## 2021-12-10 ENCOUNTER — Ambulatory Visit (HOSPITAL_BASED_OUTPATIENT_CLINIC_OR_DEPARTMENT_OTHER): Payer: Medicare Other | Admitting: Cardiology

## 2021-12-10 ENCOUNTER — Encounter (HOSPITAL_BASED_OUTPATIENT_CLINIC_OR_DEPARTMENT_OTHER): Payer: Self-pay | Admitting: Cardiology

## 2021-12-10 ENCOUNTER — Other Ambulatory Visit: Payer: Self-pay

## 2021-12-10 VITALS — BP 120/82 | HR 80 | Ht 71.0 in | Wt 185.0 lb

## 2021-12-10 DIAGNOSIS — R55 Syncope and collapse: Secondary | ICD-10-CM | POA: Diagnosis not present

## 2021-12-10 DIAGNOSIS — E782 Mixed hyperlipidemia: Secondary | ICD-10-CM

## 2021-12-10 DIAGNOSIS — Z7189 Other specified counseling: Secondary | ICD-10-CM | POA: Diagnosis not present

## 2021-12-10 DIAGNOSIS — I10 Essential (primary) hypertension: Secondary | ICD-10-CM

## 2021-12-10 NOTE — Patient Instructions (Signed)

## 2021-12-10 NOTE — Progress Notes (Signed)
Cardiology Office Note:    Date:  12/10/2021   ID:  [name], DOB Sep 07, 1966, MRN [id number]  PCP:  Fredirick Lathe, PA-C  Cardiologist:  Buford Dresser, MD  Referring MD: Fredirick Lathe, PA-C   CC: new patient consultation for near syncope.  History of Present Illness:    Jeremiah Anderson is a 56 y.o. male with a hx of hypertension, hyperlipidemia, GERD, arthritis, and chronic opioid use, who is seen as a new consult at the request of Allwardt, [name], PA-C for the evaluation and management of near-syncope.  Notes of Alyssa Allwardt, PA-C 11/04/2021 reviewed. He presented for dizzy spells that were concerning for near-syncopal events. Significant family history of CAD was noted. He was referred to cardiology and neurology to help r/o underlying etiology.  He describes his episodes as "head rushes", with a sensation like "a wave rising" from his chest "like fizz from a soda". He did not think he was ever going to pass out or be dizzy. Initially he thought his head rushes were due to stress. He denies any episodes of syncope.  Pre-existing medical conditions: Hypertension, hyperlipidemia,  Potential medication/supplement interactions: Previous reaction to lisinopril. ECG: Per PCP 11/04/21: "EKG per my reading shows NSR at 69 bpm with one random PAC. No T wave abnormalities. Low suspicion, but will refer to cards due to his family history." Family history: Grandmother has hypertension. Father has congestive heart failure.  Former smoker. Smoked on and off for 24 pack years. No longer drinks alcohol.  The only times he ever felt shortness of breath he attributed this to symptoms of withdrawal. None recently.  For exercise he uses a stationary bike, about 5 miles a day for 20 minutes. On alternate days he also does 125 pushups and squats, as well as using resistance bands.  He denies any palpitations, or chest pain. No headaches, orthopnea, PND, lower extremity edema or  exertional symptoms.  Since the time of initial referral, he had brain MRI showing likely meningioma with mass effect. He has upcoming appt at [facility] with neurosurgery.  Past Medical History:  Diagnosis Date   Allergy    Arthritis    Blood transfusion without reported diagnosis    GERD (gastroesophageal reflux disease)    Hyperlipidemia    Hypertension    Migraines    Wears glasses     Past Surgical History:  Procedure Laterality Date   APPENDECTOMY  1980s   post op bleeding sent him back for ex lap and hemostatic intervention.    BACK SURGERY     8 surgeries total - fused from L1-S1   COLONOSCOPY     Age 49. For bleeding   ESOPHAGOGASTRODUODENOSCOPY N/A 06/10/2015   Procedure: ESOPHAGOGASTRODUODENOSCOPY (EGD);  Surgeon: Lafayette Dragon, MD;  Location: [facility] ENDOSCOPY;  Service: Endoscopy;  Laterality: N/A;   ex lap with peri gastric vessel ligation  1990s   after retching, he rupture a blood vessel "outside" the stomach.  vessel was ligated per pt's descriiption.    INCISIONAL HERNIA REPAIR Right 10/21/2016   Procedure: OPEN FLANK HERNIA REPAIR WITH MESH;  Surgeon: Ralene Ok, MD;  Location: [place];  Service: General;  Laterality: Right;   INSERTION OF MESH Right 10/21/2016   Procedure: INSERTION OF MESH;  Surgeon: Ralene Ok, MD;  Location: [place];  Service: General;  Laterality: Right;   KNEE SURGERY     Multiple bilateral scopes   RETINAL DETACHMENT SURGERY Right    SHOULDER SURGERY  4x on the left - total shoulder replacement.    SHOULDER SURGERY Left    x2   TOTAL SHOULDER ARTHROPLASTY Left    x2    Current Medications: Current Outpatient Medications on File Prior to Visit  Medication Sig   amLODipine (NORVASC) 10 MG tablet Take 1 tablet (10 mg total) by mouth daily.   Cholecalciferol (VITAMIN D3) 25 MCG (1000 UT) CAPS Take by mouth daily.   Coenzyme Q10 (COQ10) 100 MG CAPS Take 100 mg by mouth daily.   cyanocobalamin 100 MCG tablet Take 100 mcg by mouth daily.    fluticasone (FLONASE) 50 MCG/ACT nasal spray Place 2 sprays into both nostrils daily.   Lidocaine 4 % PTCH Place 1-2 patches onto the skin 2 (two) times daily as needed (for pain.). Salonpas Maximum Strength Pain Relieving Gel-Patch   morphine (MSIR) 30 MG tablet Take 30 mg by mouth every 4 (four) hours as needed.   Multiple Vitamin (MULTIVITAMIN WITH MINERALS) TABS tablet Take 1 tablet by mouth daily.   Omega-3 Fatty Acids (OMEGA 3 PO) Take 120 mg by mouth daily.   ondansetron (ZOFRAN) 4 MG tablet TAKE 1 TABLET BY MOUTH EVERY 8 HOURS AS NEEDED FOR NAUSEA AND VOMITING   rosuvastatin (CRESTOR) 20 MG tablet TAKE 1 TABLET BY MOUTH EVERY DAY   SUMAtriptan (IMITREX) 100 MG tablet TAKE 1 TABLET AT EARLIEST ONSET OF MIGRAINE. MAY REPEAT IN 2HRS IF HEADACHE PERSISTS OR RECURS   tazarotene (TAZORAC) 0.1 % gel APPLY A SMALL AMOUNT TO AFFECTED AREA AT BEDTIME   testosterone cypionate (DEPOTESTOSTERONE CYPIONATE) 200 MG/ML injection    Current Facility-Administered Medications on File Prior to Visit  Medication   0.9 %  sodium chloride infusion     Allergies:   Fentanyl, Lisinopril, Lyrica [pregabalin], and Sulfa antibiotics   Social History   Tobacco Use   Smoking status: Former    Years: 24.00    Types: Cigarettes   Smokeless tobacco: Never  Vaping Use   Vaping Use: Never used  Substance Use Topics   Alcohol use: Not Currently    Alcohol/week: 0.0 standard drinks   Drug use: No    Family History: family history includes Arthritis in his mother; Cancer in his father and maternal grandmother; Colon cancer in his maternal grandmother; Heart attack in his paternal grandfather; Heart disease in his paternal grandfather; Hyperlipidemia in his mother; Hypertension in his father, paternal grandmother, and sister; Skin cancer in his father. There is no history of Diabetes, Stroke, or Prostate cancer.  ROS:   Please see the history of present illness.  Additional pertinent ROS: Constitutional:  Negative for chills, fever, night sweats, unintentional weight loss  HENT: Negative for ear pain and hearing loss.   Eyes: Negative for loss of vision and eye pain.  Respiratory: Negative for cough, sputum, wheezing.   Cardiovascular: See HPI. Gastrointestinal: Negative for abdominal pain, melena, and hematochezia.  Genitourinary: Negative for dysuria and hematuria.  Musculoskeletal: Negative for falls and myalgias.  Skin: Negative for itching and rash.  Neurological: Negative for focal weakness, focal sensory changes and loss of consciousness. Positive for stress. Endo/Heme/Allergies: Does not bruise/bleed easily.     EKGs/Labs/Other Studies Reviewed:    The following studies were reviewed today:  CT Head 12/01/2021: FINDINGS: Brain:   Cerebral volume is normal.   5.2 x 4.8 x 5.0 cm avidly and homogeneously enhancing extra-axial mass centered in the left frontal region. Significant mass effect upon the underlying left greater than right frontal lobes with  partial effacement of the frontal horns of the lateral ventricles. 5 mm rightward midline shift measured at the level of the septum pellucidum. Moderate underlying parenchymal edema within the left frontal lobe, anterior body and genu of corpus callosum and to a lesser degree within the right frontal lobe. Subcentimeter well-circumscribed focus of hypodensity in the region of the medial right frontal lobe, which may reflect a small peritumoral cyst (series 4, image 19). Intraparenchymal extension of the mass is difficult to exclude.   No acute demarcated cortical infarct is identified.   No extra-axial fluid collection.   Vascular: No hyperdense vessel on precontrast imaging. Atherosclerotic calcifications.   Skull: Normal. Negative for fracture or focal lesion.   Sinuses/Orbits: Visualized orbits show no acute finding. Mild mucosal thickening within a right ethmoid air cell.   These results were called by telephone  at the time of interpretation on 12/01/2021 at 4:20 pm to provider [facility] , who verbally acknowledged these results. Alyssa Allwardt spoke to the patient by telephone and determined appropriate follow-up prior to the patient leaving the imaging center. The patient was transported home and was not permitted to drive himself home from the imaging center.   IMPRESSION: 5.2 x 4.8 cm enhancing extra-axial mass centered in the left frontal region which likely reflects a meningioma. Significant mass effect upon the underlying left greater than right frontal lobes with partial effacement of the frontal horns of the lateral ventricles. 5 mm midline shift.   Moderate underlying parenchymal edema within the left frontal lobe, anterior body and genu of corpus callosum and to a lesser degree within the right frontal lobe. Additionally, there is a subcentimeter cystic-appearing focus in the region of the medial right frontal lobe, which may reflect a small peritumoral cyst. Intraparenchymal extension of the mass is difficult to exclude. A brain MRI with contrast may be helpful for further evaluation.  MRI brain 12/02/21 FINDINGS: Brain: Large dural-based, homogeneously enhancing extra-axial mass lesion in the left frontal region measuring approximately 58 x 49 x 40 mm. The lesion causes significant mass effect on the adjacent brain parenchyma with associated vasogenic edema and a 13 mm rightward subfalcine herniation. Flow voids are seen from the dural surface of the tumor, as well as around the pial surface. A 1.6 x 8 mm peritumoral cyst is seen in the left frontal region. No other intracranial focus of abnormal contrast enhancement identified.   No acute infarction, hemorrhage or hydrocephalus.   Vascular: Normal flow voids.   Skull and upper cervical spine: Normal marrow signal.   Sinuses/Orbits: Negative.   Other: Postcontrast axial images show bilateral facet and periarticular  contrast enhancement at C2-3, particularly on the right side (series 21, image 35 and 42), may be degenerative. In case of high clinical suspicion for septic arthritis, dedicated MRI of the cervical spine without and with contrast is recommended.   IMPRESSION: 1. Large left frontal extra-axial lesion, most consistent with a meningioma with significant mass effect on the adjacent brain parenchyma and a 13 mm rightward midline shift, similar to prior CT. 2. C2-3 facet and periarticular contrast enhancement, right greater than left, may be degenerative. Septic arthritis cannot be excluded. Dedicated MRI of the cervical spine without and with contrast is suggested.  CT Abdomen/Pelvis 01/01/2020: FINDINGS: Lower chest: Clear lung bases. No significant pleural or pericardial effusion.   Hepatobiliary: Stable small cyst inferiorly in the right hepatic lobe. No new or enlarging hepatic lesions. Stable mild extrahepatic biliary dilatation. The common hepatic duct measures approximately  10 mm on image 23/2. The duct tapers distally.   Pancreas: Unremarkable. No pancreatic ductal dilatation or surrounding inflammatory changes.   Spleen: Normal in size without focal abnormality.   Adrenals/Urinary Tract: Both adrenal glands appear normal. The kidneys, ureters and bladder demonstrate no significant findings. No evidence of urinary tract calculus or hydronephrosis.   Stomach/Bowel: No evidence of bowel wall thickening, distention or surrounding inflammatory change. Large amount of stool throughout the colon.   Vascular/Lymphatic: There are no enlarged abdominal or pelvic lymph nodes. Mild aortic and branch vessel atherosclerosis. The portal, superior mesenteric and splenic veins are patent.   Reproductive: Stable appearance of the prostate gland and seminal vesicles.   Other: Presumed interval repair of previously demonstrated right lumbar hernia containing only fat. No recurrent hernia  identified. There are stable postsurgical changes in the low anterior abdominal wall. No ascites. No intra-abdominal mass lesions identified.   Musculoskeletal: No acute or significant osseous findings. Grossly stable extensive postsurgical changes from previous L1 through S1 fusion. The hardware appears unchanged.   IMPRESSION: 1. No acute findings or explanation for the patient's symptoms. 2. Interval repair of right lumbar hernia containing only fat. No recurrent hernia identified. 3. Stable mild extrahepatic biliary dilatation, likely physiologic based on chronicity. No evidence of pancreatic mass or pancreatic ductal dilatation. 4. Large amount of stool throughout the colon consistent with constipation, extensive postsurgical changes in the lumbar spine and aortic Atherosclerosis (ICD10-I70.0).  EKG:  EKG is personally reviewed.   12/10/2021: NSR at 80 bpm  Recent Labs: 11/04/2021: ALT 30; BUN 15; Creatinine, Ser 0.98; Hemoglobin 14.4; Platelets 242.0; Potassium 4.5; Sodium 138; TSH 0.93   Recent Lipid Panel    Component Value Date/Time   CHOL 157 11/04/2021 1339   TRIG 78.0 11/04/2021 1339   HDL 61.60 11/04/2021 1339   CHOLHDL 3 11/04/2021 1339   VLDL 15.6 11/04/2021 1339   LDLCALC 79 11/04/2021 1339   LDLCALC 82 11/04/2020 1349   LDLDIRECT 180.0 02/23/2017 1236    Physical Exam:    VS:  BP 120/82 (BP Location: Left Arm, Patient Position: Sitting, Cuff Size: Normal)    Pulse 80    Ht 5\' 11"  (1.803 m)    Wt 185 lb (83.9 kg)    BMI 25.80 kg/m     Wt Readings from Last 3 Encounters:  12/10/21 185 lb (83.9 kg)  11/04/21 189 lb 4 oz (85.8 kg)  10/11/21 195 lb 6.1 oz (88.6 kg)    GEN: Well nourished, well developed in no acute distress HEENT: Normal, moist mucous membranes NECK: No JVD CARDIAC: regular rhythm, normal S1 and S2, no rubs or gallops. No murmur. VASCULAR: Radial and DP pulses 2+ bilaterally. No carotid bruits RESPIRATORY:  Clear to auscultation without  rales, wheezing or rhonchi  ABDOMEN: Soft, non-tender, non-distended MUSCULOSKELETAL:  Ambulates independently SKIN: Warm and dry, no edema NEUROLOGIC:  Alert and oriented x 3. No focal neuro deficits noted. PSYCHIATRIC:  Normal affect    ASSESSMENT:    1. Near syncope   2. Essential hypertension   3. Mixed hyperlipidemia   4. Cardiac risk counseling   5. Counseling on health promotion and disease prevention    PLAN:    Near syncope: -since referral, diagnosed with brain mass (likely meningioma) with mass effect and midline shift. Suspect this is the etiology of his symptoms -he is having no cardiac symptoms and is regularly achieving >4 METs activity. He can proceed to surgery at acceptable risk from a cardiac perspective.  Hypertension Mixed hyperlipidemia -continue amlodipine -continue rosuvastatin  Cardiac risk counseling and prevention recommendations: -recommend heart healthy/Mediterranean diet, with whole grains, fruits, vegetable, fish, lean meats, nuts, and olive oil. Limit salt. -recommend moderate walking, 3-5 times/week for 30-50 minutes each session. Aim for at least 150 minutes.week. Goal should be pace of 3 miles/hours, or walking 1.5 miles in 30 minutes -recommend avoidance of tobacco products. Avoid excess alcohol. -ASCVD risk score: The 10-year ASCVD risk score (Arnett DK, et al., 2019) is: 3.7%   Values used to calculate the score:     Age: 87 years     Sex: Male     Is Non-Hispanic African American: No     Diabetic: No     Tobacco smoker: No     Systolic Blood Pressure: 409 mmHg     Is BP treated: Yes     HDL Cholesterol: 61.6 mg/dL     Total Cholesterol: 157 mg/dL    Plan for follow up:  PRN.  Buford Dresser, MD, PhD, [facility]    Medication Adjustments/Labs and Tests Ordered: Current medicines are reviewed at length with the patient today.  Concerns regarding medicines are outlined above.   Orders Placed This  Encounter  Procedures   EKG 12-Lead   No orders of the defined types were placed in this encounter.  Patient Instructions  Medication Instructions:  Your Physician recommend you continue on your current medication as directed.    *If you need a refill on your cardiac medications before your next appointment, please call your pharmacy*   Lab Work: None ordered today   Testing/Procedures: None ordered today   Follow-Up: At [facility], you and your health needs are our priority.  As part of our continuing mission to provide you with exceptional heart care, we have created designated Provider Care Teams.  These Care Teams include your primary Cardiologist (physician) and Advanced Practice Providers (APPs -  Physician Assistants and Nurse Practitioners) who all work together to provide you with the care you need, when you need it.  We recommend signing up for the patient portal called "MyChart".  Sign up information is provided on this After Visit Summary.  MyChart is used to connect with patients for Virtual Visits (Telemedicine).  Patients are able to view lab/test results, encounter notes, upcoming appointments, etc.  Non-urgent messages can be sent to your provider as well.   To learn more about what you can do with MyChart, go to [email or web address].    Your next appointment:   As needed  The format for your next appointment:   In Person  Provider:   Buford Dresser, MD       [facility],acting as a scribe for Buford Dresser, MD.,have documented all relevant documentation on the behalf of Buford Dresser, MD,as directed by  Buford Dresser, MD while in the presence of Buford Dresser, MD.  I, Buford Dresser, MD, have reviewed all documentation for this visit. The documentation on 12/10/21 for the exam, diagnosis, procedures, and orders are all accurate and complete.   Signed, Buford Dresser, MD PhD 12/10/2021 6:51 PM     [place]

## 2021-12-14 ENCOUNTER — Ambulatory Visit (HOSPITAL_COMMUNITY): Payer: Medicare Other

## 2021-12-14 DIAGNOSIS — Z23 Encounter for immunization: Secondary | ICD-10-CM | POA: Diagnosis not present

## 2021-12-14 DIAGNOSIS — Z1159 Encounter for screening for other viral diseases: Secondary | ICD-10-CM | POA: Diagnosis not present

## 2021-12-14 DIAGNOSIS — D329 Benign neoplasm of meninges, unspecified: Secondary | ICD-10-CM | POA: Diagnosis not present

## 2021-12-16 ENCOUNTER — Other Ambulatory Visit: Payer: Self-pay

## 2021-12-16 ENCOUNTER — Ambulatory Visit (HOSPITAL_COMMUNITY)
Admission: RE | Admit: 2021-12-16 | Discharge: 2021-12-16 | Disposition: A | Discharge status: Home / Self Care | Payer: Medicare Other | Source: Ambulatory Visit | Attending: Physician Assistant | Admitting: Physician Assistant

## 2021-12-16 DIAGNOSIS — M542 Cervicalgia: Secondary | ICD-10-CM | POA: Diagnosis not present

## 2021-12-16 DIAGNOSIS — M2578 Osteophyte, vertebrae: Secondary | ICD-10-CM | POA: Diagnosis not present

## 2021-12-16 DIAGNOSIS — R930 Abnormal findings on diagnostic imaging of skull and head, not elsewhere classified: Secondary | ICD-10-CM | POA: Insufficient documentation

## 2021-12-16 DIAGNOSIS — R937 Abnormal findings on diagnostic imaging of other parts of musculoskeletal system: Secondary | ICD-10-CM | POA: Diagnosis not present

## 2021-12-16 MED ORDER — GADOBUTROL 1 MMOL/ML IV SOLN
8.5000 mL | Freq: Once | INTRAVENOUS | Status: AC | PRN
  Administered 2021-12-16: 8.5 mL via INTRAVENOUS

## 2021-12-19 ENCOUNTER — Encounter: Payer: Self-pay | Admitting: Physician Assistant

## 2021-12-20 ENCOUNTER — Other Ambulatory Visit: Payer: Self-pay | Admitting: Physician Assistant

## 2021-12-20 MED ORDER — ONDANSETRON HCL 4 MG PO TABS
ORAL_TABLET | ORAL | 3 refills | Status: DC
Start: 2021-12-20 — End: 2022-03-31

## 2021-12-23 DIAGNOSIS — Z96612 Presence of left artificial shoulder joint: Secondary | ICD-10-CM | POA: Diagnosis not present

## 2021-12-23 DIAGNOSIS — Z79891 Long term (current) use of opiate analgesic: Secondary | ICD-10-CM | POA: Diagnosis not present

## 2021-12-23 DIAGNOSIS — D320 Benign neoplasm of cerebral meninges: Secondary | ICD-10-CM | POA: Diagnosis not present

## 2021-12-23 DIAGNOSIS — Z882 Allergy status to sulfonamides status: Secondary | ICD-10-CM | POA: Diagnosis not present

## 2021-12-23 DIAGNOSIS — G8929 Other chronic pain: Secondary | ICD-10-CM | POA: Diagnosis not present

## 2021-12-23 DIAGNOSIS — Z888 Allergy status to other drugs, medicaments and biological substances status: Secondary | ICD-10-CM | POA: Diagnosis not present

## 2021-12-23 DIAGNOSIS — I700 Atherosclerosis of aorta: Secondary | ICD-10-CM | POA: Diagnosis not present

## 2021-12-23 DIAGNOSIS — Z87891 Personal history of nicotine dependence: Secondary | ICD-10-CM | POA: Diagnosis not present

## 2021-12-23 DIAGNOSIS — Z981 Arthrodesis status: Secondary | ICD-10-CM | POA: Diagnosis not present

## 2021-12-23 DIAGNOSIS — Z01818 Encounter for other preprocedural examination: Secondary | ICD-10-CM | POA: Diagnosis not present

## 2021-12-23 DIAGNOSIS — Z20822 Contact with and (suspected) exposure to covid-19: Secondary | ICD-10-CM | POA: Diagnosis not present

## 2021-12-23 DIAGNOSIS — I10 Essential (primary) hypertension: Secondary | ICD-10-CM | POA: Diagnosis not present

## 2021-12-23 DIAGNOSIS — D329 Benign neoplasm of meninges, unspecified: Secondary | ICD-10-CM | POA: Diagnosis not present

## 2021-12-23 DIAGNOSIS — Z79899 Other long term (current) drug therapy: Secondary | ICD-10-CM | POA: Diagnosis not present

## 2021-12-23 DIAGNOSIS — M1990 Unspecified osteoarthritis, unspecified site: Secondary | ICD-10-CM | POA: Diagnosis not present

## 2021-12-23 DIAGNOSIS — K219 Gastro-esophageal reflux disease without esophagitis: Secondary | ICD-10-CM | POA: Diagnosis not present

## 2021-12-23 DIAGNOSIS — Z885 Allergy status to narcotic agent status: Secondary | ICD-10-CM | POA: Diagnosis not present

## 2021-12-23 DIAGNOSIS — E782 Mixed hyperlipidemia: Secondary | ICD-10-CM | POA: Diagnosis not present

## 2022-01-04 ENCOUNTER — Telehealth: Payer: Self-pay | Admitting: Physician Assistant

## 2022-01-04 NOTE — Telephone Encounter (Signed)
Pt states he has been unable to sleep for 6 days straight. He recently had surgery and he does not know if it is the medications are causing this. He was wanting to see if she could call something in. I went ahead and scheduled pt for 01/05/22 at 1:30pm

## 2022-01-05 ENCOUNTER — Other Ambulatory Visit: Payer: Self-pay

## 2022-01-05 ENCOUNTER — Ambulatory Visit (INDEPENDENT_AMBULATORY_CARE_PROVIDER_SITE_OTHER): Payer: Medicare Other | Admitting: Physician Assistant

## 2022-01-05 ENCOUNTER — Other Ambulatory Visit (HOSPITAL_COMMUNITY): Payer: Self-pay | Admitting: Physician Assistant

## 2022-01-05 ENCOUNTER — Encounter: Payer: Self-pay | Admitting: Physician Assistant

## 2022-01-05 VITALS — BP 142/89 | HR 89 | Temp 98.0°F | Ht 71.0 in | Wt 191.2 lb

## 2022-01-05 DIAGNOSIS — M47818 Spondylosis without myelopathy or radiculopathy, sacral and sacrococcygeal region: Secondary | ICD-10-CM | POA: Diagnosis not present

## 2022-01-05 DIAGNOSIS — F19982 Other psychoactive substance use, unspecified with psychoactive substance-induced sleep disorder: Secondary | ICD-10-CM

## 2022-01-05 DIAGNOSIS — Z9889 Other specified postprocedural states: Secondary | ICD-10-CM

## 2022-01-05 DIAGNOSIS — M62830 Muscle spasm of back: Secondary | ICD-10-CM | POA: Diagnosis not present

## 2022-01-05 DIAGNOSIS — M25552 Pain in left hip: Secondary | ICD-10-CM | POA: Diagnosis not present

## 2022-01-05 DIAGNOSIS — D329 Benign neoplasm of meninges, unspecified: Secondary | ICD-10-CM

## 2022-01-05 DIAGNOSIS — G894 Chronic pain syndrome: Secondary | ICD-10-CM | POA: Diagnosis not present

## 2022-01-05 MED ORDER — TRAZODONE HCL 50 MG PO TABS: 50.0000 mg | ORAL_TABLET | Freq: Every day | ORAL | 0 refills | Status: DC

## 2022-01-05 NOTE — Telephone Encounter (Signed)
FYI

## 2022-01-05 NOTE — Patient Instructions (Signed)
Please take one tablet of Trazodone 50 mg about 30 minutes prior to bedtime. You may take a second dose if needed. Call me tomorrow with update on how you are doing.   OK to reschedule physical for later this summer.

## 2022-01-05 NOTE — Telephone Encounter (Signed)
Appt with Alyssa

## 2022-01-05 NOTE — Progress Notes (Signed)
Subjective:    Patient ID: Jeremiah Anderson, male    DOB: [birth year], 56 y.o.   MRN: [id number]  Chief Complaint  Patient presents with   Insomnia    HPI Patient is in today for complaints of insomnia s/p craniotomy w/excision of meningioma on 12/24/2021. He is here with his friend, Juliann Pulse. -He has only been sleeping 5-20 minutes every 4 hours or so since the surgery. -He has been in communication with Dr. Shanda Bumps team about this. Keppra was stopped and he was switched to Vimpat today. He has completed decadron taper as of two days ago.  -Melatonin and Benadryl did not help and seemed to make symptoms worse.  Past Medical History:  Diagnosis Date   Allergy    Arthritis    Blood transfusion without reported diagnosis    GERD (gastroesophageal reflux disease)    Hyperlipidemia    Hypertension    Migraines    Wears glasses     Past Surgical History:  Procedure Laterality Date   APPENDECTOMY  1980s   post op bleeding sent him back for ex lap and hemostatic intervention.    BACK SURGERY     8 surgeries total - fused from L1-S1   COLONOSCOPY     Age 88. For bleeding   ESOPHAGOGASTRODUODENOSCOPY N/A 06/10/2015   Procedure: ESOPHAGOGASTRODUODENOSCOPY (EGD);  Surgeon: Lafayette Dragon, MD;  Location: [facility] ENDOSCOPY;  Service: Endoscopy;  Laterality: N/A;   ex lap with peri gastric vessel ligation  1990s   after retching, he rupture a blood vessel "outside" the stomach.  vessel was ligated per pt's descriiption.    INCISIONAL HERNIA REPAIR Right 10/21/2016   Procedure: OPEN FLANK HERNIA REPAIR WITH MESH;  Surgeon: Ralene Ok, MD;  Location: [place];  Service: General;  Laterality: Right;   INSERTION OF MESH Right 10/21/2016   Procedure: INSERTION OF MESH;  Surgeon: Ralene Ok, MD;  Location: [place];  Service: General;  Laterality: Right;   KNEE SURGERY     Multiple bilateral scopes   RETINAL DETACHMENT SURGERY Right    SHOULDER SURGERY     4x on the left - total shoulder  replacement.    SHOULDER SURGERY Left    x2   TOTAL SHOULDER ARTHROPLASTY Left    x2    Family History  Problem Relation Age of Onset   Arthritis Mother    Hyperlipidemia Mother    Hypertension Father    Cancer Father    Skin cancer Father    Colon cancer Maternal Grandmother    Cancer Maternal Grandmother    Hypertension Sister    Hypertension Paternal Grandmother    Heart attack Paternal Grandfather    Heart disease Paternal Grandfather    Diabetes Neg Hx    Stroke Neg Hx    Prostate cancer Neg Hx     Social History   Tobacco Use   Smoking status: Former    Years: 24.00    Types: Cigarettes   Smokeless tobacco: Never  Vaping Use   Vaping Use: Never used  Substance Use Topics   Alcohol use: Not Currently    Alcohol/week: 0.0 standard drinks   Drug use: No     Allergies  Allergen Reactions   Fentanyl Other (See Comments)    Hallucinations (with the patch)    Lisinopril Swelling   Lyrica [Pregabalin]     hallucinations   Sulfa Antibiotics Swelling    Review of Systems NEGATIVE UNLESS OTHERWISE INDICATED IN HPI  Objective:     BP (!) 142/89    Pulse 89    Temp 98 F (36.7 C)    Ht 5\' 11"  (1.803 m)    Wt 191 lb 3.2 oz (86.7 kg)    SpO2 97%    BMI 26.67 kg/m   Wt Readings from Last 3 Encounters:  01/05/22 191 lb 3.2 oz (86.7 kg)  12/10/21 185 lb (83.9 kg)  11/04/21 189 lb 4 oz (85.8 kg)    BP Readings from Last 3 Encounters:  01/05/22 (!) 142/89  12/10/21 120/82  10/11/21 137/88     Physical Exam Vitals and nursing note reviewed.  Constitutional:      Appearance: Normal appearance. He is not toxic-appearing.  HENT:     Head:      Comments: Healing surgical scar with numerous staples from craniotomy     Right Ear: External ear normal.     Left Ear: External ear normal.     Nose: Nose normal.     Mouth/Throat:     Mouth: Mucous membranes are moist.     Pharynx: Oropharynx is clear.  Eyes:     Extraocular Movements: Extraocular  movements intact.     Conjunctiva/sclera: Conjunctivae normal.     Pupils: Pupils are equal, round, and reactive to light.  Cardiovascular:     Rate and Rhythm: Normal rate and regular rhythm.     Pulses: Normal pulses.     Heart sounds: Normal heart sounds.  Pulmonary:     Effort: Pulmonary effort is normal.     Breath sounds: Normal breath sounds.  Musculoskeletal:     Cervical back: Normal range of motion and neck supple.     Right lower leg: No edema.     Left lower leg: No edema.  Skin:    General: Skin is warm and dry.  Neurological:     General: No focal deficit present.     Mental Status: He is alert and oriented to person, place, and time.     Cranial Nerves: No cranial nerve deficit.     Sensory: No sensory deficit.     Motor: No weakness.     Coordination: Coordination is intact. Romberg sign negative. Coordination normal.     Gait: Gait normal.  Psychiatric:        Mood and Affect: Mood is anxious.        Behavior: Behavior is agitated and hyperactive.       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    Dispense:  20 tablet    Refill:  0   1. 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 Status post craniotomy -I personally reviewed phone notes from Dr. Shanda Bumps team and I also discussed with physician on-site today, Dr. Cherlynn Kaiser about his situation. -Most likely insomnia 2/2 steroid taper, Keppra, and overall anxiety after just having major brain surgery. -Considered benzodiazepines, but concerned about interaction with his morphine tablets he is on chronically. -Also discussed Ambien, but he is nervous about possible side effects. -Agreed to trial Trazodone 50-100 mg qhs until things level out again. -Pt to  call  me back tomorrow with update.   Time Spent: 33 minutes of total time was spent on the date of the encounter performing the following actions: chart review prior to seeing the patient, obtaining history, performing a medically necessary exam, counseling on the treatment plan, placing orders, and documenting in our EHR.    Wilmont Olund M Hillary Struss, PA-C

## 2022-01-06 ENCOUNTER — Encounter: Payer: Self-pay | Admitting: Physician Assistant

## 2022-01-07 DIAGNOSIS — Z4802 Encounter for removal of sutures: Secondary | ICD-10-CM | POA: Diagnosis not present

## 2022-01-12 ENCOUNTER — Encounter: Payer: Medicare Other | Admitting: Physician Assistant

## 2022-02-02 DIAGNOSIS — G894 Chronic pain syndrome: Secondary | ICD-10-CM | POA: Diagnosis not present

## 2022-02-02 DIAGNOSIS — M47818 Spondylosis without myelopathy or radiculopathy, sacral and sacrococcygeal region: Secondary | ICD-10-CM | POA: Diagnosis not present

## 2022-02-02 DIAGNOSIS — M25552 Pain in left hip: Secondary | ICD-10-CM | POA: Diagnosis not present

## 2022-02-02 DIAGNOSIS — M62830 Muscle spasm of back: Secondary | ICD-10-CM | POA: Diagnosis not present

## 2022-02-11 ENCOUNTER — Other Ambulatory Visit: Payer: Self-pay

## 2022-02-11 ENCOUNTER — Ambulatory Visit (HOSPITAL_COMMUNITY)
Admission: RE | Admit: 2022-02-11 | Discharge: 2022-02-11 | Disposition: A | Discharge status: Home / Self Care | Payer: Medicare Other | Source: Ambulatory Visit | Attending: Physician Assistant | Admitting: Physician Assistant

## 2022-02-11 DIAGNOSIS — D329 Benign neoplasm of meninges, unspecified: Secondary | ICD-10-CM | POA: Diagnosis not present

## 2022-02-11 DIAGNOSIS — R220 Localized swelling, mass and lump, head: Secondary | ICD-10-CM | POA: Diagnosis not present

## 2022-02-11 DIAGNOSIS — D320 Benign neoplasm of cerebral meninges: Secondary | ICD-10-CM | POA: Diagnosis not present

## 2022-02-11 MED ORDER — GADOBUTROL 1 MMOL/ML IV SOLN
8.0000 mL | Freq: Once | INTRAVENOUS | Status: AC | PRN
  Administered 2022-02-11: 8 mL via INTRAVENOUS

## 2022-02-15 DIAGNOSIS — D329 Benign neoplasm of meninges, unspecified: Secondary | ICD-10-CM | POA: Diagnosis not present

## 2022-02-17 ENCOUNTER — Encounter: Payer: Self-pay | Admitting: Physician Assistant

## 2022-02-21 MED ORDER — ROSUVASTATIN CALCIUM 20 MG PO TABS: 20.0000 mg | ORAL_TABLET | Freq: Every day | ORAL | 3 refills | Status: DC

## 2022-03-02 DIAGNOSIS — G894 Chronic pain syndrome: Secondary | ICD-10-CM | POA: Diagnosis not present

## 2022-03-02 DIAGNOSIS — M47818 Spondylosis without myelopathy or radiculopathy, sacral and sacrococcygeal region: Secondary | ICD-10-CM | POA: Diagnosis not present

## 2022-03-02 DIAGNOSIS — Z79891 Long term (current) use of opiate analgesic: Secondary | ICD-10-CM | POA: Diagnosis not present

## 2022-03-02 DIAGNOSIS — M62830 Muscle spasm of back: Secondary | ICD-10-CM | POA: Diagnosis not present

## 2022-03-08 DIAGNOSIS — M546 Pain in thoracic spine: Secondary | ICD-10-CM | POA: Diagnosis not present

## 2022-03-08 DIAGNOSIS — M7651 Patellar tendinitis, right knee: Secondary | ICD-10-CM | POA: Diagnosis not present

## 2022-03-08 DIAGNOSIS — M5136 Other intervertebral disc degeneration, lumbar region: Secondary | ICD-10-CM | POA: Diagnosis not present

## 2022-03-08 DIAGNOSIS — R531 Weakness: Secondary | ICD-10-CM | POA: Diagnosis not present

## 2022-03-18 DIAGNOSIS — H43393 Other vitreous opacities, bilateral: Secondary | ICD-10-CM | POA: Diagnosis not present

## 2022-03-23 ENCOUNTER — Telehealth: Payer: Self-pay | Admitting: Physician Assistant

## 2022-03-23 DIAGNOSIS — M5136 Other intervertebral disc degeneration, lumbar region: Secondary | ICD-10-CM | POA: Diagnosis not present

## 2022-03-23 DIAGNOSIS — R531 Weakness: Secondary | ICD-10-CM | POA: Diagnosis not present

## 2022-03-23 DIAGNOSIS — M7651 Patellar tendinitis, right knee: Secondary | ICD-10-CM | POA: Diagnosis not present

## 2022-03-23 DIAGNOSIS — M546 Pain in thoracic spine: Secondary | ICD-10-CM | POA: Diagnosis not present

## 2022-03-23 NOTE — Telephone Encounter (Signed)
Copied from [place] [phone]. Topic: Medicare AWV ?>> Mar 23, 2022  9:50 AM Harris-Coley, Hannah Beat wrote: ?Reason for CRM: Left message for patient to schedule Annual Wellness Visit.  Please schedule with Nurse Health Advisor Charlott Rakes, RN at [facility]  Please call [phone] ask for Juliann Pulse ?

## 2022-03-30 ENCOUNTER — Other Ambulatory Visit: Payer: Self-pay | Admitting: Physician Assistant

## 2022-03-30 DIAGNOSIS — M5136 Other intervertebral disc degeneration, lumbar region: Secondary | ICD-10-CM | POA: Diagnosis not present

## 2022-03-30 DIAGNOSIS — Z79891 Long term (current) use of opiate analgesic: Secondary | ICD-10-CM | POA: Diagnosis not present

## 2022-03-30 DIAGNOSIS — M7651 Patellar tendinitis, right knee: Secondary | ICD-10-CM | POA: Diagnosis not present

## 2022-03-30 DIAGNOSIS — M47818 Spondylosis without myelopathy or radiculopathy, sacral and sacrococcygeal region: Secondary | ICD-10-CM | POA: Diagnosis not present

## 2022-03-30 DIAGNOSIS — M62830 Muscle spasm of back: Secondary | ICD-10-CM | POA: Diagnosis not present

## 2022-03-30 DIAGNOSIS — R531 Weakness: Secondary | ICD-10-CM | POA: Diagnosis not present

## 2022-03-30 DIAGNOSIS — M546 Pain in thoracic spine: Secondary | ICD-10-CM | POA: Diagnosis not present

## 2022-03-30 DIAGNOSIS — G894 Chronic pain syndrome: Secondary | ICD-10-CM | POA: Diagnosis not present

## 2022-04-04 ENCOUNTER — Encounter: Payer: Medicare Other | Admitting: Physician Assistant

## 2022-04-04 DIAGNOSIS — M5136 Other intervertebral disc degeneration, lumbar region: Secondary | ICD-10-CM | POA: Diagnosis not present

## 2022-04-04 DIAGNOSIS — R531 Weakness: Secondary | ICD-10-CM | POA: Diagnosis not present

## 2022-04-04 DIAGNOSIS — M546 Pain in thoracic spine: Secondary | ICD-10-CM | POA: Diagnosis not present

## 2022-04-04 DIAGNOSIS — M7651 Patellar tendinitis, right knee: Secondary | ICD-10-CM | POA: Diagnosis not present

## 2022-04-07 ENCOUNTER — Ambulatory Visit (INDEPENDENT_AMBULATORY_CARE_PROVIDER_SITE_OTHER): Payer: Medicare Other

## 2022-04-07 ENCOUNTER — Ambulatory Visit (INDEPENDENT_AMBULATORY_CARE_PROVIDER_SITE_OTHER): Payer: Medicare Other | Admitting: Specialist

## 2022-04-07 ENCOUNTER — Encounter: Payer: Self-pay | Admitting: Specialist

## 2022-04-07 VITALS — BP 135/82 | HR 91 | Ht 70.0 in | Wt 192.0 lb

## 2022-04-07 DIAGNOSIS — G8929 Other chronic pain: Secondary | ICD-10-CM

## 2022-04-07 DIAGNOSIS — M542 Cervicalgia: Secondary | ICD-10-CM | POA: Diagnosis not present

## 2022-04-07 DIAGNOSIS — S29019S Strain of muscle and tendon of unspecified wall of thorax, sequela: Secondary | ICD-10-CM

## 2022-04-07 DIAGNOSIS — M545 Low back pain, unspecified: Secondary | ICD-10-CM | POA: Diagnosis not present

## 2022-04-07 MED ORDER — MELOXICAM 15 MG PO TABS: 15.0000 mg | ORAL_TABLET | Freq: Every day | ORAL | 2 refills | Status: DC

## 2022-04-07 NOTE — Patient Instructions (Signed)
Avoid overhead lifting and overhead use of the arms. Pillows to keep from sleeping directly on the shoulders Limited lifting to less than 10 lbs. Ice or heat for relief. NSAIDs are helpful, such as alleve or motrin, be careful not to use in excess as they place burdens on the kidney. Stretching exercise help and strengthening is helpful to build endurance.  Avoid frequent bending and stooping  No lifting greater than 10 lbs. May use ice or moist heat for pain. Weight loss is of benefit. Exercise is important to improve your indurance and does allow people to function better inspite of back pain. A shoulder replacement has a life of about 15 years in a normal population with reduced activity and use of the arm. Early loosening can result with overuse and young patients loosen prosthesis earlier than elderly probably related to activity level. A long segment fusion of L1 to S1 is over 50% impairment and for this reason you are impaired in terms of your Capacity  to perform work on a chronic basis. It is reasonable to try to return to work but from a medical stand point I think that the risks of experiencing increased symptoms and discomfort and wear at levels adjacent to the long lumbar fusion are significant. You have a 40% likelihood that more surgery will be necessary in the future.

## 2022-04-07 NOTE — Progress Notes (Signed)
Office Visit Note   Patient: Jeremiah Anderson           Date of Birth: 01/26/1966           MRN: [id number] Visit Date: 04/07/2022              Requested by: Allwardt, Randa Evens, PA-C [place],  [place] [id number] PCP: Fredirick Lathe, PA-C   Assessment & Plan: Visit Diagnoses:  1. Chronic low back pain, unspecified back pain laterality, unspecified whether sciatica present   2. Cervicalgia   3. Thoracic myofascial strain, sequela     Plan: Avoid overhead lifting and overhead use of the arms. Pillows to keep from sleeping directly on the shoulders Limited lifting to less than 10 lbs. Ice or heat for relief. NSAIDs are helpful, such as alleve or motrin, be careful not to use in excess as they place burdens on the kidney. Stretching exercise help and strengthening is helpful to build endurance.  Avoid frequent bending and stooping  No lifting greater than 10 lbs. May use ice or moist heat for pain. Weight loss is of benefit. Exercise is important to improve your indurance and does allow people to function better inspite of back pain. A shoulder replacement has a life of about 15 years in a normal population with reduced activity and use of the arm. Early loosening can result with overuse and young patients loosen prosthesis earlier than elderly probably related to activity level. A long segment fusion of L1 to S1 is over 50% impairment and for this reason you are impaired in terms of your Capacity  to perform work on a chronic basis. It is reasonable to try to return to work but from a medical stand point I think that the risks of experiencing increased symptoms and discomfort and wear at levels adjacent to the long lumbar fusion are significant. You have a 40% likelihood that more surgery will be necessary in the future.  Follow-Up Instructions: No follow-ups on file.   Orders:  Orders Placed This Encounter  Procedures   XR Lumbar Spine 2-3 Views   XR Cervical  Spine 2 or 3 views   No orders of the defined types were placed in this encounter.     Procedures: No procedures performed   Clinical Data: No additional findings.   Subjective: Chief Complaint  Patient presents with   Neck - Follow-up   Lower Back - Follow-up    HPI  Review of Systems  Constitutional: Negative.   HENT: Negative.    Eyes: Negative.   Respiratory: Negative.    Cardiovascular: Negative.   Gastrointestinal: Negative.   Endocrine: Negative.   Genitourinary: Negative.   Musculoskeletal: Negative.   Skin: Negative.   Allergic/Immunologic: Negative.   Neurological: Negative.   Hematological: Negative.   Psychiatric/Behavioral: Negative.      Objective: Vital Signs: BP 135/82 (BP Location: Left Arm, Patient Position: Sitting)   Pulse 91   Ht '5\' 10"'$  (1.778 m)   Wt 192 lb (87.1 kg)   BMI 27.55 kg/m   Physical Exam Constitutional:      Appearance: He is well-developed.  HENT:     Head: Normocephalic and atraumatic.  Eyes:     Pupils: Pupils are equal, round, and reactive to light.  Pulmonary:     Effort: Pulmonary effort is normal.     Breath sounds: Normal breath sounds.  Abdominal:     General: Bowel sounds are normal.     Palpations:  Abdomen is soft.  Musculoskeletal:     Cervical back: Normal range of motion and neck supple.     Lumbar back: Negative right straight leg raise test and negative left straight leg raise test.  Skin:    General: Skin is warm and dry.  Neurological:     Mental Status: He is alert and oriented to person, place, and time.  Psychiatric:        Behavior: Behavior normal.        Thought Content: Thought content normal.        Judgment: Judgment normal.    Back Exam   Tenderness  The patient is experiencing tenderness in the lumbar.  Range of Motion  Extension:  abnormal  Flexion:  abnormal  Lateral bend right:  abnormal  Lateral bend left:  abnormal  Rotation right:  abnormal  Rotation left:   abnormal   Muscle Strength  Right Quadriceps:  5/5  Left Quadriceps:  5/5  Right Hamstrings:  5/5  Left Hamstrings:  5/5   Tests  Straight leg raise right: negative Straight leg raise left: negative  Other  Toe walk: normal Heel walk: normal Erythema: no back redness Scars: present     Specialty Comments:  No specialty comments available.  Imaging: No results found.   PMFS History: Patient Active Problem List   Diagnosis Date Noted   Testosterone deficiency 11/04/2020   Vitamin D deficiency 11/04/2020   Attention deficit hyperactivity disorder (ADHD), combined type 11/04/2020   Elevated LFTs 03/23/2020   Fusion of spine of lumbar region 10/31/2019   Spondylolysis of cervical region 10/31/2019   Chronic, continuous use of opioids 07/17/2019   Facet arthropathy, cervical 07/17/2019   Neck pain 12/17/2017   Patellar tendinitis of right knee 08/18/2017   Medicare annual wellness visit, subsequent 01/27/2017   Abscess of nasal cavity 06/14/2016   Right flank mass 06/14/2016   Tobacco use disorder 06/14/2016   Overweight (BMI 25.0-29.9) 06/14/2016   Sinusitis, chronic 08/12/2015   Arthralgia 07/06/2015   Chronic back pain 06/10/2015   Multiple duodenal ulcers 06/10/2015   Hyperlipidemia 05/07/2015   Migraines 04/21/2015   Essential hypertension 02/16/2015   Impingement syndrome of shoulder 08/07/2014   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   Past Medical History:  Diagnosis Date   Allergy    Arthritis    Blood transfusion without reported diagnosis    GERD (gastroesophageal reflux disease)    Hyperlipidemia    Hypertension    Migraines    Wears glasses     Family History  Problem Relation Age of Onset   Arthritis Mother    Hyperlipidemia Mother    Hypertension Father    Cancer Father    Skin cancer Father    Colon cancer Maternal Grandmother    Cancer Maternal Grandmother    Hypertension Sister    Hypertension Paternal Grandmother    Heart attack  Paternal Grandfather    Heart disease Paternal Grandfather    Diabetes Neg Hx    Stroke Neg Hx    Prostate cancer Neg Hx     Past Surgical History:  Procedure Laterality Date   APPENDECTOMY  1980s   post op bleeding sent him back for ex lap and hemostatic intervention.    BACK SURGERY     8 surgeries total - fused from L1-S1   COLONOSCOPY     Age 41. For bleeding   ESOPHAGOGASTRODUODENOSCOPY N/A 06/10/2015   Procedure: ESOPHAGOGASTRODUODENOSCOPY (EGD);  Surgeon: Lafayette Dragon, MD;  Location: [facility]  ENDOSCOPY;  Service: Endoscopy;  Laterality: N/A;   ex lap with peri gastric vessel ligation  1990s   after retching, he rupture a blood vessel "outside" the stomach.  vessel was ligated per pt's descriiption.    INCISIONAL HERNIA REPAIR Right 10/21/2016   Procedure: OPEN FLANK HERNIA REPAIR WITH MESH;  Surgeon: Ralene Ok, MD;  Location: [place];  Service: General;  Laterality: Right;   INSERTION OF MESH Right 10/21/2016   Procedure: INSERTION OF MESH;  Surgeon: Ralene Ok, MD;  Location: [place];  Service: General;  Laterality: Right;   KNEE SURGERY     Multiple bilateral scopes   RETINAL DETACHMENT SURGERY Right    SHOULDER SURGERY     4x on the left - total shoulder replacement.    SHOULDER SURGERY Left    x2   TOTAL SHOULDER ARTHROPLASTY Left    x2   Social History   Occupational History   Occupation: Disability    Comment: Former Therapist, sports  Tobacco Use   Smoking status: Former    Years: 24.00    Types: Cigarettes   Smokeless tobacco: Never  Vaping Use   Vaping Use: Never used  Substance and Sexual Activity   Alcohol use: Not Currently    Alcohol/week: 0.0 standard drinks   Drug use: No   Sexual activity: Not on file

## 2022-04-11 ENCOUNTER — Ambulatory Visit (INDEPENDENT_AMBULATORY_CARE_PROVIDER_SITE_OTHER): Payer: Medicare Other | Admitting: Physician Assistant

## 2022-04-11 VITALS — BP 126/84 | HR 74 | Temp 98.1°F | Ht 70.0 in | Wt 191.6 lb

## 2022-04-11 DIAGNOSIS — E782 Mixed hyperlipidemia: Secondary | ICD-10-CM

## 2022-04-11 DIAGNOSIS — Z0000 Encounter for general adult medical examination without abnormal findings: Secondary | ICD-10-CM

## 2022-04-11 DIAGNOSIS — Z125 Encounter for screening for malignant neoplasm of prostate: Secondary | ICD-10-CM | POA: Diagnosis not present

## 2022-04-11 DIAGNOSIS — Z23 Encounter for immunization: Secondary | ICD-10-CM

## 2022-04-11 LAB — LIPID PANEL
Cholesterol: 150 mg/dL (ref 0–200)
HDL: 49.3 mg/dL (ref >39.00)
LDL Cholesterol: 80 mg/dL (ref 0–99)
NonHDL: 100.91
Total CHOL/HDL Ratio: 3
Triglycerides: 107 mg/dL (ref 0.0–149.0)
VLDL: 21.4 mg/dL (ref 0.0–40.0)

## 2022-04-11 LAB — COMPREHENSIVE METABOLIC PANEL
ALT: 32 U/L (ref 0–53)
AST: 31 U/L (ref 0–37)
Albumin: 4.5 g/dL (ref 3.5–5.2)
Alkaline Phosphatase: 44 U/L (ref 39–117)
BUN: 17 mg/dL (ref 6–23)
CO2: 35 mEq/L — ABNORMAL HIGH (ref 19–32)
Calcium: 9.7 mg/dL (ref 8.4–10.5)
Chloride: 100 mEq/L (ref 96–112)
Creatinine, Ser: 1.03 mg/dL (ref 0.40–1.50)
GFR: 81.53 mL/min (ref >60.00)
Glucose, Bld: 104 mg/dL — ABNORMAL HIGH (ref 70–99)
Potassium: 4.4 mEq/L (ref 3.5–5.1)
Sodium: 141 mEq/L (ref 135–145)
Total Bilirubin: 0.5 mg/dL (ref 0.2–1.2)
Total Protein: 6.5 g/dL (ref 6.0–8.3)

## 2022-04-11 LAB — CBC WITH DIFFERENTIAL/PLATELET
Basophils Absolute: 0 10*3/uL (ref 0.0–0.1)
Basophils Relative: 0.4 % (ref 0.0–3.0)
Eosinophils Absolute: 0.3 10*3/uL (ref 0.0–0.7)
Eosinophils Relative: 4.6 % (ref 0.0–5.0)
HCT: 40.6 % (ref 39.0–52.0)
Hemoglobin: 14 g/dL (ref 13.0–17.0)
Lymphocytes Relative: 34.8 % (ref 12.0–46.0)
Lymphs Abs: 2 10*3/uL (ref 0.7–4.0)
MCHC: 34.5 g/dL (ref 30.0–36.0)
MCV: 82.7 fl (ref 78.0–100.0)
Monocytes Absolute: 0.4 10*3/uL (ref 0.1–1.0)
Monocytes Relative: 7.6 % (ref 3.0–12.0)
Neutro Abs: 3.1 10*3/uL (ref 1.4–7.7)
Neutrophils Relative %: 52.6 % (ref 43.0–77.0)
Platelets: 247 10*3/uL (ref 150.0–400.0)
RBC: 4.91 Mil/uL (ref 4.22–5.81)
RDW: 13.5 % (ref 11.5–15.5)
WBC: 5.8 10*3/uL (ref 4.0–10.5)

## 2022-04-11 LAB — PSA: PSA: 0.43 ng/mL (ref 0.10–4.00)

## 2022-04-11 LAB — PHOSPHORUS: Phosphorus: 3.9 mg/dL (ref 2.3–4.6)

## 2022-04-11 LAB — MAGNESIUM: Magnesium: 2 mg/dL (ref 1.5–2.5)

## 2022-04-11 LAB — VITAMIN D 25 HYDROXY (VIT D DEFICIENCY, FRACTURES): VITD: 54.29 ng/mL (ref 30.00–100.00)

## 2022-04-11 NOTE — Progress Notes (Signed)
Complete physical exam  Patient: Jeremiah Anderson   DOB: [birth year]   56 y.o. Male  MRN: [id number]  Subjective:    Chief Complaint  Patient presents with   Annual Exam    Pt is fasting; no concerns to discuss otherwise;     Jeremiah Anderson is a 56 y.o. male who presents today for a complete physical exam. He reports consuming a general and low fat diet. Home exercise routine includes stretching and squats, biking, push-ups, etc. He generally feels well. He reports sleeping well. He does not have additional problems to discuss today.    Most recent fall risk assessment:    05/02/2018    9:17 AM  [place] in the past year? No     Most recent depression screenings:    04/11/2022   12:05 PM 12/13/2017    3:26 PM  PHQ 2/9 Scores  PHQ - 2 Score 0 0    Vision:Within last year and Dental: No current dental problems and Receives regular dental care  Past Medical History:  Diagnosis Date   Allergy    Arthritis    Blood transfusion without reported diagnosis    GERD (gastroesophageal reflux disease)    Hyperlipidemia    Hypertension    Migraines    Wears glasses    Social History   Tobacco Use   Smoking status: Former    Years: 24.00    Types: Cigarettes   Smokeless tobacco: Never  Vaping Use   Vaping Use: Never used  Substance Use Topics   Alcohol use: Not Currently    Alcohol/week: 0.0 standard drinks   Drug use: No      Patient Care Team: Ladislaus Repsher, Randa Evens, PA-C as PCP - General (Physician Assistant) Buford Dresser, MD as PCP - Cardiology (Cardiology) Irine Seal, MD as Attending Physician (Urology)   Outpatient Medications Prior to Visit  Medication Sig   amLODipine (NORVASC) 10 MG tablet Take 1 tablet (10 mg total) by mouth daily.   Cholecalciferol (VITAMIN D3) 25 MCG (1000 UT) CAPS Take by mouth daily.   Coenzyme Q10 (COQ10) 100 MG CAPS Take 100 mg by mouth daily.   cyanocobalamin 100 MCG tablet Take 100 mcg by mouth daily.   fluticasone  (FLONASE) 50 MCG/ACT nasal spray Place 2 sprays into both nostrils daily.   Lidocaine 4 % PTCH Place 1-2 patches onto the skin 2 (two) times daily as needed (for pain.). Salonpas Maximum Strength Pain Relieving Gel-Patch   meloxicam (MOBIC) 15 MG tablet Take 1 tablet (15 mg total) by mouth daily.   morphine (MSIR) 30 MG tablet Take 30 mg by mouth every 4 (four) hours as needed.   Multiple Vitamin (MULTIVITAMIN WITH MINERALS) TABS tablet Take 1 tablet by mouth daily.   Omega-3 Fatty Acids (OMEGA 3 PO) Take 120 mg by mouth daily.   ondansetron (ZOFRAN) 4 MG tablet TAKE 1 TABLET BY MOUTH EVERY 8 HOURS AS NEEDED FOR NAUSEA AND VOMITING   rosuvastatin (CRESTOR) 20 MG tablet Take 1 tablet (20 mg total) by mouth daily.   SUMAtriptan (IMITREX) 100 MG tablet TAKE 1 TABLET AT EARLIEST ONSET OF MIGRAINE. MAY REPEAT IN 2HRS IF HEADACHE PERSISTS OR RECURS   tazarotene (TAZORAC) 0.1 % gel APPLY A SMALL AMOUNT TO AFFECTED AREA AT BEDTIME   testosterone cypionate (DEPOTESTOSTERONE CYPIONATE) 200 MG/ML injection    traZODone (DESYREL) 50 MG tablet Take 1 tablet (50 mg total) by mouth at bedtime.   Facility-Administered Medications Prior to Visit  Medication Dose Route Frequency Provider   0.9 %  sodium chloride infusion  500 mL Intravenous Continuous Danis, Estill Cotta III, MD    ROS  REFER TO HPI FOR PERTINENT POSITIVES AND NEGATIVES      Objective:     BP 126/84 (BP Location: Left Arm)   Pulse 74   Temp 98.1 F (36.7 C) (Temporal)   Ht '5\' 10"'$  (1.778 m)   Wt 191 lb 9.6 oz (86.9 kg)   SpO2 98%   BMI 27.49 kg/m  BP Readings from Last 3 Encounters:  04/11/22 126/84  04/07/22 135/82  01/05/22 (!) 142/89   Wt Readings from Last 3 Encounters:  04/11/22 191 lb 9.6 oz (86.9 kg)  04/07/22 192 lb (87.1 kg)  01/05/22 191 lb 3.2 oz (86.7 kg)      Physical Exam Vitals and nursing note reviewed.  Constitutional:      General: He is not in acute distress.    Appearance: Normal appearance. He is not  toxic-appearing.  HENT:     Head: Normocephalic and atraumatic.     Comments: Very faint minimal scar across head from surgery - looks great!     Right Ear: Tympanic membrane, ear canal and external ear normal.     Left Ear: Tympanic membrane, ear canal and external ear normal.     Nose: Nose normal.     Mouth/Throat:     Mouth: Mucous membranes are moist.     Pharynx: Oropharynx is clear.  Eyes:     Extraocular Movements: Extraocular movements intact.     Conjunctiva/sclera: Conjunctivae normal.     Pupils: Pupils are equal, round, and reactive to light.  Cardiovascular:     Rate and Rhythm: Normal rate and regular rhythm.     Pulses: Normal pulses.     Heart sounds: Normal heart sounds.  Pulmonary:     Effort: Pulmonary effort is normal.     Breath sounds: Normal breath sounds.  Abdominal:     General: Abdomen is flat. Bowel sounds are normal.     Palpations: Abdomen is soft.     Tenderness: There is no abdominal tenderness.  Musculoskeletal:        General: Normal range of motion.     Cervical back: Normal range of motion and neck supple.  Skin:    General: Skin is warm and dry.  Neurological:     General: No focal deficit present.     Mental Status: He is alert and oriented to person, place, and time.  Psychiatric:        Mood and Affect: Mood normal.        Behavior: Behavior normal.      Assessment & Plan:    Routine Health Maintenance and Physical Exam  Immunization History  Administered Date(s) Administered   Hepatitis B, adult 05/31/2021   PPD Test 01/22/2018, 05/31/2021   Td 11/18/2017    Health Maintenance  Topic Date Due   COVID-19 Vaccine (1) Never done   HIV Screening  Never done   Hepatitis C Screening  Never done   Zoster Vaccines- Shingrix (1 of 2) Never done   INFLUENZA VACCINE  06/21/2022   COLONOSCOPY (Pts 45-49yr Insurance coverage will need to be confirmed)  04/06/2027   TETANUS/TDAP  11/19/2027   HPV VACCINES  Aged Out    Discussed  health benefits of physical activity, and encouraged him to engage in regular exercise appropriate for his age and condition.  Problem List Items Addressed  This Visit       Other   Hyperlipidemia   Relevant Orders   Lipid panel   Other Visit Diagnoses     Encounter for annual physical exam    -  Primary   Relevant Orders   HIV antibody (with reflex)   Hepatitis C Antibody   CBC with Differential/Platelet   Comprehensive metabolic panel   Lipid panel   Prostate cancer screening       Relevant Orders   PSA   Hypocalcemia       Relevant Orders   PTH, Intact and Calcium   Magnesium   Phosphorus   Vitamin D (25 hydroxy)   Need for prophylactic vaccination and inoculation against varicella       Relevant Orders   Varicella-zoster vaccine IM (Shingrix)      Age-appropriate screening and counseling performed today. Will check labs and call with results. Preventive measures discussed and printed in AVS for patient.  -Doing great with lifestyle -UTD vision / dental -No other concerns to address today    Return in about 6 months (around 10/12/2022) for recheck .     Nelda Luckey M Iaan Oregel, PA-C

## 2022-04-11 NOTE — Patient Instructions (Addendum)
Great to see you today as always. Fasting labs and I will call with results and send through [place].  Keep up the good work with your health.  Call sooner if any concerns.

## 2022-04-12 DIAGNOSIS — M7651 Patellar tendinitis, right knee: Secondary | ICD-10-CM | POA: Diagnosis not present

## 2022-04-12 DIAGNOSIS — R531 Weakness: Secondary | ICD-10-CM | POA: Diagnosis not present

## 2022-04-12 DIAGNOSIS — M546 Pain in thoracic spine: Secondary | ICD-10-CM | POA: Diagnosis not present

## 2022-04-12 DIAGNOSIS — M5136 Other intervertebral disc degeneration, lumbar region: Secondary | ICD-10-CM | POA: Diagnosis not present

## 2022-04-12 LAB — HEPATITIS C ANTIBODY
Hepatitis C Ab: NONREACTIVE
SIGNAL TO CUT-OFF: 0.06 (ref <1.00)

## 2022-04-12 LAB — PTH, INTACT AND CALCIUM
Calcium: 9.8 mg/dL (ref 8.6–10.3)
PTH: 24 pg/mL (ref 16–77)

## 2022-04-12 LAB — EXTRA SPECIMEN

## 2022-04-12 LAB — HIV ANTIBODY (ROUTINE TESTING W REFLEX): HIV 1&2 Ab, 4th Generation: NONREACTIVE

## 2022-04-14 ENCOUNTER — Ambulatory Visit (INDEPENDENT_AMBULATORY_CARE_PROVIDER_SITE_OTHER): Payer: Medicare Other | Admitting: Orthopedic Surgery

## 2022-04-14 ENCOUNTER — Ambulatory Visit (INDEPENDENT_AMBULATORY_CARE_PROVIDER_SITE_OTHER): Payer: Medicare Other

## 2022-04-14 ENCOUNTER — Encounter: Payer: Self-pay | Admitting: Physician Assistant

## 2022-04-14 DIAGNOSIS — R531 Weakness: Secondary | ICD-10-CM | POA: Diagnosis not present

## 2022-04-14 DIAGNOSIS — M6702 Short Achilles tendon (acquired), left ankle: Secondary | ICD-10-CM

## 2022-04-14 DIAGNOSIS — M79672 Pain in left foot: Secondary | ICD-10-CM

## 2022-04-14 DIAGNOSIS — M5136 Other intervertebral disc degeneration, lumbar region: Secondary | ICD-10-CM | POA: Diagnosis not present

## 2022-04-14 DIAGNOSIS — M546 Pain in thoracic spine: Secondary | ICD-10-CM | POA: Diagnosis not present

## 2022-04-14 DIAGNOSIS — M2022 Hallux rigidus, left foot: Secondary | ICD-10-CM

## 2022-04-14 DIAGNOSIS — M7651 Patellar tendinitis, right knee: Secondary | ICD-10-CM | POA: Diagnosis not present

## 2022-04-19 DIAGNOSIS — R531 Weakness: Secondary | ICD-10-CM | POA: Diagnosis not present

## 2022-04-19 DIAGNOSIS — M5136 Other intervertebral disc degeneration, lumbar region: Secondary | ICD-10-CM | POA: Diagnosis not present

## 2022-04-19 DIAGNOSIS — M546 Pain in thoracic spine: Secondary | ICD-10-CM | POA: Diagnosis not present

## 2022-04-19 DIAGNOSIS — M7651 Patellar tendinitis, right knee: Secondary | ICD-10-CM | POA: Diagnosis not present

## 2022-04-20 ENCOUNTER — Encounter: Payer: Self-pay | Admitting: Orthopedic Surgery

## 2022-04-20 NOTE — Progress Notes (Signed)
Office Visit Note   Patient: Jeremiah Anderson           Date of Birth: 01/26/1966           MRN: [id number] Visit Date: 04/14/2022              Requested by: Allwardt, Randa Evens, PA-C [place],  [place] [id number] PCP: Fredirick Lathe, PA-C  Chief Complaint  Patient presents with   Left Foot - Pain      HPI: Patient is a 56 year old gentleman who presents complaining of pain left foot great toe MTP joint.  Patient has noticed a bump which started a few months ago.  Assessment & Plan: Visit Diagnoses:  1. Pain in left foot   2. Hallux rigidus, left foot   3. Achilles tendon contracture, left     Plan: Recommended stiff soled shoes orthotics and Achilles stretching.  Follow-Up Instructions: Return if symptoms worsen or fail to improve.   Ortho Exam  Patient is alert, oriented, no adenopathy, well-dressed, normal affect, normal respiratory effort. Examination patient has good pulses.  He does have Achilles tightness with dorsiflexion only to neutral with his knee extended.  He has hallux rigidus of the left great toe with dorsiflexion of only 10 degrees with spurring attempted dorsiflexion reproduces the symptoms.  Imaging: No results found. No images are attached to the encounter.  Labs: No results found for: HGBA1C, ESRSEDRATE, CRP, LABURIC, REPTSTATUS, GRAMSTAIN, CULT, LABORGA   Lab Results  Component Value Date   ALBUMIN 4.5 04/11/2022   ALBUMIN 4.6 11/04/2021   ALBUMIN 4.5 02/23/2017    Lab Results  Component Value Date   MG 2.0 04/11/2022   Lab Results  Component Value Date   VD25OH 54.29 04/11/2022   VD25OH 88 11/04/2020    No results found for: PREALBUMIN    Latest Ref Rng & Units 04/11/2022   12:41 PM 11/04/2021    1:39 PM 11/04/2020    1:49 PM  CBC EXTENDED  WBC 4.0 - 10.5 K/uL 5.8   5.9   7.3    RBC 4.22 - 5.81 Mil/uL 4.91   4.91   5.09    Hemoglobin 13.0 - 17.0 g/dL 14.0   14.4   15.2    HCT 39.0 - 52.0 % 40.6   42.0    44.7    Platelets 150.0 - 400.0 K/uL 247.0   242.0   257    NEUT# 1.4 - 7.7 K/uL 3.1   3.7   4,219    Lymph# 0.7 - 4.0 K/uL 2.0   1.6   2,270       There is no height or weight on file to calculate BMI.  Orders:  Orders Placed This Encounter  Procedures   XR Foot Complete Left   No orders of the defined types were placed in this encounter.    Procedures: No procedures performed  Clinical Data: No additional findings.  ROS:  All other systems negative, except as noted in the HPI. Review of Systems  Objective: Vital Signs: There were no vitals taken for this visit.  Specialty Comments:  No specialty comments available.  PMFS History: Patient Active Problem List   Diagnosis Date Noted   Testosterone deficiency 11/04/2020   Vitamin D deficiency 11/04/2020   Attention deficit hyperactivity disorder (ADHD), combined type 11/04/2020   Elevated LFTs 03/23/2020   Fusion of spine of lumbar region 10/31/2019   Spondylolysis of cervical region 10/31/2019   Chronic, continuous  use of opioids 07/17/2019   Facet arthropathy, cervical 07/17/2019   Neck pain 12/17/2017   Patellar tendinitis of right knee 08/18/2017   Medicare annual wellness visit, subsequent 01/27/2017   Abscess of nasal cavity 06/14/2016   Right flank mass 06/14/2016   Tobacco use disorder 06/14/2016   Overweight (BMI 25.0-29.9) 06/14/2016   Sinusitis, chronic 08/12/2015   Arthralgia 07/06/2015   Chronic back pain 06/10/2015   Multiple duodenal ulcers 06/10/2015   Hyperlipidemia 05/07/2015   Migraines 04/21/2015   Essential hypertension 02/16/2015   Impingement syndrome of shoulder 08/07/2014   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   Past Medical History:  Diagnosis Date   Allergy    Arthritis    Blood transfusion without reported diagnosis    GERD (gastroesophageal reflux disease)    Hyperlipidemia    Hypertension    Migraines    Wears glasses     Family History  Problem Relation Age of Onset    Arthritis Mother    Hyperlipidemia Mother    Hypertension Father    Cancer Father    Skin cancer Father    Colon cancer Maternal Grandmother    Cancer Maternal Grandmother    Hypertension Sister    Hypertension Paternal Grandmother    Heart attack Paternal Grandfather    Heart disease Paternal Grandfather    Diabetes Neg Hx    Stroke Neg Hx    Prostate cancer Neg Hx     Past Surgical History:  Procedure Laterality Date   APPENDECTOMY  1980s   post op bleeding sent him back for ex lap and hemostatic intervention.    BACK SURGERY     8 surgeries total - fused from L1-S1   COLONOSCOPY     Age 56. For bleeding   ESOPHAGOGASTRODUODENOSCOPY N/A 06/10/2015   Procedure: ESOPHAGOGASTRODUODENOSCOPY (EGD);  Surgeon: Lafayette Dragon, MD;  Location: [facility] ENDOSCOPY;  Service: Endoscopy;  Laterality: N/A;   ex lap with peri gastric vessel ligation  1990s   after retching, he rupture a blood vessel "outside" the stomach.  vessel was ligated per pt's descriiption.    INCISIONAL HERNIA REPAIR Right 10/21/2016   Procedure: OPEN FLANK HERNIA REPAIR WITH MESH;  Surgeon: Ralene Ok, MD;  Location: [place];  Service: General;  Laterality: Right;   INSERTION OF MESH Right 10/21/2016   Procedure: INSERTION OF MESH;  Surgeon: Ralene Ok, MD;  Location: [place];  Service: General;  Laterality: Right;   KNEE SURGERY     Multiple bilateral scopes   RETINAL DETACHMENT SURGERY Right    SHOULDER SURGERY     4x on the left - total shoulder replacement.    SHOULDER SURGERY Left    x2   TOTAL SHOULDER ARTHROPLASTY Left    x2   Social History   Occupational History   Occupation: Disability    Comment: Former Therapist, sports  Tobacco Use   Smoking status: Former    Years: 24.00    Types: Cigarettes   Smokeless tobacco: Never  Vaping Use   Vaping Use: Never used  Substance and Sexual Activity   Alcohol use: Not Currently    Alcohol/week: 0.0 standard drinks   Drug use: No   Sexual activity: Not on file

## 2022-04-21 ENCOUNTER — Other Ambulatory Visit: Payer: Self-pay

## 2022-04-21 ENCOUNTER — Encounter: Payer: Self-pay | Admitting: Physician Assistant

## 2022-04-21 DIAGNOSIS — M5136 Other intervertebral disc degeneration, lumbar region: Secondary | ICD-10-CM | POA: Diagnosis not present

## 2022-04-21 DIAGNOSIS — M7651 Patellar tendinitis, right knee: Secondary | ICD-10-CM | POA: Diagnosis not present

## 2022-04-21 DIAGNOSIS — M546 Pain in thoracic spine: Secondary | ICD-10-CM | POA: Diagnosis not present

## 2022-04-21 DIAGNOSIS — R531 Weakness: Secondary | ICD-10-CM | POA: Diagnosis not present

## 2022-04-21 MED ORDER — SUMATRIPTAN SUCCINATE 100 MG PO TABS: ORAL_TABLET | ORAL | 3 refills | Status: DC

## 2022-04-22 ENCOUNTER — Other Ambulatory Visit: Payer: Self-pay

## 2022-04-22 MED ORDER — SUMATRIPTAN SUCCINATE 100 MG PO TABS: ORAL_TABLET | ORAL | 3 refills | Status: DC

## 2022-04-25 DIAGNOSIS — M5136 Other intervertebral disc degeneration, lumbar region: Secondary | ICD-10-CM | POA: Diagnosis not present

## 2022-04-25 DIAGNOSIS — R531 Weakness: Secondary | ICD-10-CM | POA: Diagnosis not present

## 2022-04-25 DIAGNOSIS — M7651 Patellar tendinitis, right knee: Secondary | ICD-10-CM | POA: Diagnosis not present

## 2022-04-25 DIAGNOSIS — M546 Pain in thoracic spine: Secondary | ICD-10-CM | POA: Diagnosis not present

## 2022-04-26 DIAGNOSIS — H43813 Vitreous degeneration, bilateral: Secondary | ICD-10-CM | POA: Diagnosis not present

## 2022-04-26 DIAGNOSIS — H35372 Puckering of macula, left eye: Secondary | ICD-10-CM | POA: Diagnosis not present

## 2022-04-26 DIAGNOSIS — H31093 Other chorioretinal scars, bilateral: Secondary | ICD-10-CM | POA: Diagnosis not present

## 2022-04-26 DIAGNOSIS — H43393 Other vitreous opacities, bilateral: Secondary | ICD-10-CM | POA: Diagnosis not present

## 2022-04-27 DIAGNOSIS — M62830 Muscle spasm of back: Secondary | ICD-10-CM | POA: Diagnosis not present

## 2022-04-27 DIAGNOSIS — Z79891 Long term (current) use of opiate analgesic: Secondary | ICD-10-CM | POA: Diagnosis not present

## 2022-04-27 DIAGNOSIS — G894 Chronic pain syndrome: Secondary | ICD-10-CM | POA: Diagnosis not present

## 2022-04-27 DIAGNOSIS — M47818 Spondylosis without myelopathy or radiculopathy, sacral and sacrococcygeal region: Secondary | ICD-10-CM | POA: Diagnosis not present

## 2022-04-28 DIAGNOSIS — M546 Pain in thoracic spine: Secondary | ICD-10-CM | POA: Diagnosis not present

## 2022-04-28 DIAGNOSIS — M5136 Other intervertebral disc degeneration, lumbar region: Secondary | ICD-10-CM | POA: Diagnosis not present

## 2022-04-28 DIAGNOSIS — R531 Weakness: Secondary | ICD-10-CM | POA: Diagnosis not present

## 2022-04-28 DIAGNOSIS — M7651 Patellar tendinitis, right knee: Secondary | ICD-10-CM | POA: Diagnosis not present

## 2022-05-01 ENCOUNTER — Encounter: Payer: Self-pay | Admitting: Specialist

## 2022-05-03 DIAGNOSIS — R531 Weakness: Secondary | ICD-10-CM | POA: Diagnosis not present

## 2022-05-03 DIAGNOSIS — M546 Pain in thoracic spine: Secondary | ICD-10-CM | POA: Diagnosis not present

## 2022-05-03 DIAGNOSIS — M5136 Other intervertebral disc degeneration, lumbar region: Secondary | ICD-10-CM | POA: Diagnosis not present

## 2022-05-03 DIAGNOSIS — M7651 Patellar tendinitis, right knee: Secondary | ICD-10-CM | POA: Diagnosis not present

## 2022-05-19 ENCOUNTER — Ambulatory Visit: Payer: Medicare Other | Admitting: Specialist

## 2022-05-19 DIAGNOSIS — R531 Weakness: Secondary | ICD-10-CM | POA: Diagnosis not present

## 2022-05-19 DIAGNOSIS — M546 Pain in thoracic spine: Secondary | ICD-10-CM | POA: Diagnosis not present

## 2022-05-19 DIAGNOSIS — G894 Chronic pain syndrome: Secondary | ICD-10-CM | POA: Diagnosis not present

## 2022-05-19 DIAGNOSIS — M62830 Muscle spasm of back: Secondary | ICD-10-CM | POA: Diagnosis not present

## 2022-05-19 DIAGNOSIS — M7651 Patellar tendinitis, right knee: Secondary | ICD-10-CM | POA: Diagnosis not present

## 2022-05-19 DIAGNOSIS — M5136 Other intervertebral disc degeneration, lumbar region: Secondary | ICD-10-CM | POA: Diagnosis not present

## 2022-05-19 DIAGNOSIS — M47818 Spondylosis without myelopathy or radiculopathy, sacral and sacrococcygeal region: Secondary | ICD-10-CM | POA: Diagnosis not present

## 2022-05-19 DIAGNOSIS — Z79891 Long term (current) use of opiate analgesic: Secondary | ICD-10-CM | POA: Diagnosis not present

## 2022-06-22 ENCOUNTER — Other Ambulatory Visit: Payer: Self-pay | Admitting: Specialist

## 2022-07-08 ENCOUNTER — Encounter: Payer: Self-pay | Admitting: Physician Assistant

## 2022-07-08 ENCOUNTER — Ambulatory Visit (INDEPENDENT_AMBULATORY_CARE_PROVIDER_SITE_OTHER): Payer: 59 | Admitting: Physician Assistant

## 2022-07-08 ENCOUNTER — Other Ambulatory Visit (HOSPITAL_BASED_OUTPATIENT_CLINIC_OR_DEPARTMENT_OTHER): Payer: Self-pay

## 2022-07-08 VITALS — BP 142/86 | HR 80 | Temp 97.6°F | Ht 70.0 in | Wt 184.8 lb

## 2022-07-08 DIAGNOSIS — F902 Attention-deficit hyperactivity disorder, combined type: Secondary | ICD-10-CM | POA: Diagnosis not present

## 2022-07-08 MED ORDER — LISDEXAMFETAMINE DIMESYLATE 30 MG PO CAPS
30.0000 mg | ORAL_CAPSULE | Freq: Every day | ORAL | 0 refills | Status: DC
  Filled 2022-07-08: qty 30, 30d supply, fill #0

## 2022-07-08 NOTE — Patient Instructions (Addendum)
Try to reach out to Jeremiah Anderson for [employer] on Vyvanse 30 mg first thing to start the day  Call if any concerns

## 2022-07-08 NOTE — Progress Notes (Signed)
Subjective:    Patient ID: Jeremiah Anderson, male    DOB: 09-05-1966, 56 y.o.   MRN: [id number]  Chief Complaint  Patient presents with   Follow-up    Pt in for f/u visit; and pain in LE; pt hasn't been on ADHD meds because of brain tumor and other issues in the past. 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    BACK SURGERY     8 surgeries total - fused from L1-S1   COLONOSCOPY     Age 34. For bleeding   ESOPHAGOGASTRODUODENOSCOPY N/A 06/10/2015   Procedure: ESOPHAGOGASTRODUODENOSCOPY (EGD);  Surgeon: Lafayette Dragon, MD;  Location: [facility] ENDOSCOPY;  Service: Endoscopy;  Laterality: N/A;   ex lap with peri gastric vessel ligation  1990s   after retching, he rupture a blood vessel "outside" the stomach.  vessel was ligated per pt's descriiption.    INCISIONAL HERNIA REPAIR Right 10/21/2016   Procedure: OPEN FLANK HERNIA REPAIR  WITH MESH;  Surgeon: Ralene Ok, MD;  Location: [place];  Service: General;  Laterality: Right;   INSERTION OF MESH Right 10/21/2016   Procedure: INSERTION OF MESH;  Surgeon: Ralene Ok, MD;  Location: [place];  Service: General;  Laterality: Right;   KNEE SURGERY     Multiple bilateral scopes   RETINAL DETACHMENT SURGERY Right    SHOULDER SURGERY     4x on the left - total shoulder replacement.    SHOULDER SURGERY Left    x2   TOTAL SHOULDER ARTHROPLASTY Left    x2    Family History  Problem Relation Age of Onset   Arthritis Mother    Hyperlipidemia Mother    Hypertension Father    Cancer Father    Skin cancer Father    Colon cancer Maternal Grandmother    Cancer Maternal Grandmother    Hypertension Sister    Hypertension Paternal Grandmother    Heart attack Paternal Grandfather    Heart disease Paternal Grandfather    Diabetes Neg Hx    Stroke Neg Hx    Prostate cancer Neg Hx     Social History   Tobacco Use   Smoking status: Former    Years: 24.00    Types: Cigarettes   Smokeless tobacco: Never  Vaping Use   Vaping Use: Never  used  Substance Use Topics   Alcohol use: Not Currently    Alcohol/week: 0.0 standard drinks of alcohol   Drug use: No     Allergies  Allergen Reactions   Fentanyl Other (See Comments)    Hallucinations (with the patch)    Lisinopril Swelling   Lyrica [Pregabalin]     hallucinations   Sulfa Antibiotics Swelling    Review of Systems NEGATIVE UNLESS OTHERWISE INDICATED IN HPI      Objective:     BP (!) 142/86 (BP Location: Right Arm)   Pulse 80   Temp 97.6 F (36.4 C) (Temporal)   Ht '5\' 10"'$  (1.778 m)   Wt 184 lb 12.8 oz (83.8 kg)   SpO2 98%   BMI 26.52 kg/m   Wt Readings from Last 3 Encounters:  07/08/22 184 lb 12.8 oz (83.8 kg)  04/11/22 191 lb 9.6 oz (86.9 kg)  04/07/22 192 lb (87.1 kg)    BP Readings from Last 3 Encounters:  07/08/22 (!) 142/86  04/11/22 126/84  04/07/22 135/82     Physical  Exam Vitals and nursing note reviewed.  Constitutional:      General: He is not in acute distress.    Appearance: Normal appearance. He is not toxic-appearing.  HENT:     Head: Normocephalic and atraumatic.     Comments: Very faint minimal scar across head from surgery - looks great!     Mouth/Throat:     Pharynx: Oropharynx is clear.  Eyes:     Extraocular Movements: Extraocular movements intact.     Conjunctiva/sclera: Conjunctivae normal.     Pupils: Pupils are equal, round, and reactive to light.  Cardiovascular:     Rate and Rhythm: Normal rate and regular rhythm.     Pulses: Normal pulses.     Heart sounds: Normal heart sounds.  Pulmonary:     Effort: Pulmonary effort is normal.     Breath sounds: Normal breath sounds.  Musculoskeletal:        General: Normal range of motion.     Cervical back: Normal range of motion and neck supple.  Skin:    General: Skin is warm and dry.  Neurological:     General: No focal deficit present.     Mental Status: He is alert and oriented to person, place, and time.  Psychiatric:        Mood and Affect: Mood normal.        Behavior: Behavior is hyperactive.        Assessment & Plan:   Problem List Items Addressed This Visit       Other   Attention deficit hyperactivity disorder (ADHD), combined type - Primary     Meds ordered this encounter  Medications   lisdexamfetamine (VYVANSE) 30 MG capsule    Sig: Take 1 capsule (30 mg total) by mouth daily.    Dispense:  30 capsule    Refill:  0    Order Specific Question:   Supervising Provider    Answer:   Marin Olp [[id number]]    Plan:  1. Attention deficit hyperactivity disorder (ADHD), combined type PDMP reviewed today. He has a prior history of ADHD, years since treatment; didn't tolerate Adderall well; now affecting his career; took into account his chronic pain syndrome, takes morphine as directed, regular consistent f/up with pain management. Discussed options.  Elected to start on Vyvanse 30 mg. Pt aware of risks vs benefits and possible adverse reactions. Close f/up with me.  He will call sooner if any concerns.    Return in about 2 weeks (around 07/22/2022) for med recheck .  This note was prepared with assistance of Systems analyst. Occasional wrong-word or sound-a-like substitutions may have occurred due to the inherent limitations of voice recognition software.  Time Spent: 35 minutes of total time was spent on the date of the encounter performing the following actions: chart review prior to seeing the patient, obtaining history, performing a medically necessary exam, counseling on the treatment plan, placing orders, and documenting in our EHR.       Sheilah Rayos M Rich Paprocki, PA-C

## 2022-07-19 ENCOUNTER — Ambulatory Visit (INDEPENDENT_AMBULATORY_CARE_PROVIDER_SITE_OTHER): Payer: 59 | Admitting: Physician Assistant

## 2022-07-19 ENCOUNTER — Other Ambulatory Visit (HOSPITAL_BASED_OUTPATIENT_CLINIC_OR_DEPARTMENT_OTHER): Payer: Self-pay

## 2022-07-19 ENCOUNTER — Encounter: Payer: Self-pay | Admitting: Physician Assistant

## 2022-07-19 VITALS — BP 146/90 | HR 75 | Temp 98.2°F | Ht 70.0 in | Wt 182.6 lb

## 2022-07-19 DIAGNOSIS — I10 Essential (primary) hypertension: Secondary | ICD-10-CM

## 2022-07-19 DIAGNOSIS — F902 Attention-deficit hyperactivity disorder, combined type: Secondary | ICD-10-CM

## 2022-07-19 MED ORDER — AMPHETAMINE-DEXTROAMPHET ER 10 MG PO CP24
10.0000 mg | ORAL_CAPSULE | Freq: Every day | ORAL | 0 refills | Status: DC
  Filled 2022-07-19: qty 15, 15d supply, fill #0

## 2022-07-19 NOTE — Progress Notes (Signed)
Subjective:    Patient ID: Jeremiah Anderson, male    DOB: [birth year], 56 y.o.   MRN: [id number]  Chief Complaint  Patient presents with   Follow-up    Pt in for follow up and med check; pt c/o medicine not working best for him due to constipation and not able to ave bowel movements.     HPI Patient is in today for ADHD recheck. Patient started taking Vyvanse 30 mg for the last 2 weeks and states he hasn't been having regular bowel movements. Didn't take it last night and was able to be regular again. Not sure how well it really helped with focus because he was worried about his stomach. States he talked to mom again and she said he only had problems with Adderall at very high doses years ago, he might be willing to trial this medication again.   Past Medical History:  Diagnosis Date   Allergy    Arthritis    Blood transfusion without reported diagnosis    around age 17 after major nausea secondary to prescribed MS Contin   GERD (gastroesophageal reflux disease)    Hyperlipidemia    Hypertension    Migraines    Wears glasses     Past Surgical History:  Procedure Laterality Date   APPENDECTOMY  1980s   post op bleeding sent him back for ex lap and hemostatic intervention.    BACK SURGERY     8 surgeries total - fused from L1-S1   COLONOSCOPY     Age 53. For bleeding   ESOPHAGOGASTRODUODENOSCOPY N/A 06/10/2015   Procedure: ESOPHAGOGASTRODUODENOSCOPY (EGD);  Surgeon: Lafayette Dragon, MD;  Location: [facility] ENDOSCOPY;  Service: Endoscopy;  Laterality: N/A;   ex lap with peri gastric vessel ligation  1990s   after retching, he rupture a blood vessel "outside" the stomach.  vessel was ligated per pt's descriiption.    INCISIONAL HERNIA REPAIR Right 10/21/2016   Procedure: OPEN FLANK HERNIA REPAIR WITH MESH;  Surgeon: Ralene Ok, MD;  Location: [place];  Service: General;  Laterality: Right;   INSERTION OF MESH Right 10/21/2016   Procedure: INSERTION OF MESH;  Surgeon: Ralene Ok, MD;   Location: [place];  Service: General;  Laterality: Right;   KNEE SURGERY     Multiple bilateral scopes   RETINAL DETACHMENT SURGERY Right    SHOULDER SURGERY     4x on the left - total shoulder replacement.    SHOULDER SURGERY Left    x2   TOTAL SHOULDER ARTHROPLASTY Left    x2    Family History  Problem Relation Age of Onset   Arthritis Mother    Hyperlipidemia Mother    Hypertension Father    Cancer Father    Skin cancer Father    Colon cancer Maternal Grandmother    Cancer Maternal Grandmother    Hypertension Sister    Hypertension Paternal Grandmother    Heart attack Paternal Grandfather    Heart disease Paternal Grandfather    Diabetes Neg Hx    Stroke Neg Hx    Prostate cancer Neg Hx     Social History   Tobacco Use   Smoking status: Former    Years: 24.00    Types: Cigarettes   Smokeless tobacco: Never  Vaping Use   Vaping Use: Never used  Substance Use Topics   Alcohol use: Not Currently    Alcohol/week: 0.0 standard drinks of alcohol   Drug use: No     Allergies  Allergen Reactions   Fentanyl Other (See Comments)    Hallucinations (with the patch)    Lisinopril Swelling   Lyrica [Pregabalin]     hallucinations   Sulfa Antibiotics Swelling    Review of Systems NEGATIVE UNLESS OTHERWISE INDICATED IN HPI      Objective:     BP (!) 146/90 (BP Location: Left Arm)   Pulse 75   Temp 98.2 F (36.8 C) (Temporal)   Ht '5\' 10"'$  (1.778 m)   Wt 182 lb 9.6 oz (82.8 kg)   SpO2 99%   BMI 26.20 kg/m   Wt Readings from Last 3 Encounters:  07/19/22 182 lb 9.6 oz (82.8 kg)  07/08/22 184 lb 12.8 oz (83.8 kg)  04/11/22 191 lb 9.6 oz (86.9 kg)    BP Readings from Last 3 Encounters:  07/19/22 (!) 146/90  07/08/22 (!) 142/86  04/11/22 126/84     Physical Exam Vitals and nursing note reviewed.  Constitutional:      General: He is not in acute distress.    Appearance: Normal appearance. He is not toxic-appearing.  HENT:     Head: Normocephalic and  atraumatic.     Comments: Very faint minimal scar across head from surgery - looks great!     Mouth/Throat:     Pharynx: Oropharynx is clear.  Eyes:     Extraocular Movements: Extraocular movements intact.     Conjunctiva/sclera: Conjunctivae normal.     Pupils: Pupils are equal, round, and reactive to light.  Cardiovascular:     Rate and Rhythm: Normal rate and regular rhythm.     Pulses: Normal pulses.     Heart sounds: Normal heart sounds.  Pulmonary:     Effort: Pulmonary effort is normal.     Breath sounds: Normal breath sounds.  Musculoskeletal:        General: Normal range of motion.     Cervical back: Normal range of motion and neck supple.  Skin:    General: Skin is warm and dry.  Neurological:     General: No focal deficit present.     Mental Status: He is alert and oriented to person, place, and time.  Psychiatric:        Mood and Affect: Mood normal.        Behavior: Behavior is hyperactive.        Assessment & Plan:   Problem List Items Addressed This Visit       Cardiovascular and Mediastinum   Essential hypertension    Elevated today Currently on amlodipine 10 mg daily Track at home, bring readings; adjust medications prn        Other   Attention deficit hyperactivity disorder (ADHD), combined type - Primary    Recently started back on treatment with me. Failed Vyvanse due to severe constipation. Trial Adderall XR 10 mg once daily in the mornings. Pt aware of risks vs benefits and possible adverse reactions. Close f/up with me in about 2 weeks or prn.        Meds ordered this encounter  Medications   amphetamine-dextroamphetamine (ADDERALL XR) 10 MG 24 hr capsule    Sig: Take 1 capsule (10 mg total) by mouth daily.    Dispense:  15 capsule    Refill:  0    Order Specific Question:   Supervising Provider    Answer:   Marin Olp [[id number]]     Return in about 2 weeks (around 08/02/2022) for med recheck .  Decorey Wahlert M Jarad Barth, PA-C

## 2022-07-19 NOTE — Assessment & Plan Note (Signed)
Recently started back on treatment with me. Failed Vyvanse due to severe constipation. Trial Adderall XR 10 mg once daily in the mornings. Pt aware of risks vs benefits and possible adverse reactions. Close f/up with me in about 2 weeks or prn.

## 2022-07-19 NOTE — Patient Instructions (Signed)
Please keep log of BP readings at home and bring to next appt  STOP Vyvanse  Start on Adderall XR 10 mg instead  Call sooner if any concerns

## 2022-07-19 NOTE — Assessment & Plan Note (Signed)
Elevated today Currently on amlodipine 10 mg daily Track at home, bring readings; adjust medications prn

## 2022-07-20 ENCOUNTER — Encounter: Payer: Self-pay | Admitting: Physician Assistant

## 2022-07-21 ENCOUNTER — Other Ambulatory Visit: Payer: Self-pay

## 2022-07-21 ENCOUNTER — Ambulatory Visit: Payer: Medicare Other | Admitting: Specialist

## 2022-07-21 DIAGNOSIS — I10 Essential (primary) hypertension: Secondary | ICD-10-CM

## 2022-07-21 MED ORDER — AMLODIPINE BESYLATE 10 MG PO TABS: 10.0000 mg | ORAL_TABLET | Freq: Every day | ORAL | 3 refills | Status: DC

## 2022-07-22 ENCOUNTER — Ambulatory Visit: Payer: 59 | Admitting: Physician Assistant

## 2022-07-28 ENCOUNTER — Ambulatory Visit (INDEPENDENT_AMBULATORY_CARE_PROVIDER_SITE_OTHER): Payer: 59 | Admitting: Physician Assistant

## 2022-07-28 ENCOUNTER — Other Ambulatory Visit (HOSPITAL_BASED_OUTPATIENT_CLINIC_OR_DEPARTMENT_OTHER): Payer: Self-pay

## 2022-07-28 VITALS — BP 136/86 | HR 80 | Temp 98.1°F | Ht 70.0 in | Wt 182.4 lb

## 2022-07-28 DIAGNOSIS — F902 Attention-deficit hyperactivity disorder, combined type: Secondary | ICD-10-CM | POA: Diagnosis not present

## 2022-07-28 DIAGNOSIS — I10 Essential (primary) hypertension: Secondary | ICD-10-CM | POA: Diagnosis not present

## 2022-07-28 MED ORDER — AMPHETAMINE-DEXTROAMPHET ER 20 MG PO CP24
20.0000 mg | ORAL_CAPSULE | Freq: Every day | ORAL | 0 refills | Status: DC
  Filled 2022-07-28: qty 15, 15d supply, fill #0

## 2022-07-28 NOTE — Assessment & Plan Note (Signed)
Blood pressure is normal.  Normal readings at home.  Continue on amlodipine 10 mg daily.

## 2022-07-28 NOTE — Patient Instructions (Signed)
Increase Adderall XR to 20 mg daily in mornings Call if any concerns  BP looks good!

## 2022-07-28 NOTE — Assessment & Plan Note (Signed)
Recently started back on treatment with me. Failed Vyvanse due to severe constipation. Doing well with Adderall XR so far.  Plan to increase to 20 mg at this time. Pt aware of risks vs benefits and possible adverse reactions. Close f/up with me in about 2 weeks or prn.

## 2022-07-28 NOTE — Progress Notes (Signed)
Subjective:    Patient ID: Jeremiah Anderson, male    DOB: 1966/10/25, 56 y.o.   MRN: [id number]  Chief Complaint  Patient presents with   Follow-up    Pt in the office for 2 wk follow up; pt was unable to have bowel movements due to medicine, stopped taking and bowels were moving again. Pt doesn't feel it is strong enough.     HPI Patient is in today for 2 week recheck.  Feeling better since starting on Adderall XR 10 mg daily.  Failed Vyvanse because of constipation and now that he is off it, bowel movements are normal again.  Also rechecking blood pressure.  Taking amlodipine 10 mg daily.  Blood pressure has been running normal at home.  Past Medical History:  Diagnosis Date   Allergy    Arthritis    Blood transfusion without reported diagnosis    around age 89 after major nausea secondary to prescribed MS Contin   GERD (gastroesophageal reflux disease)    Hyperlipidemia    Hypertension    Migraines    Wears glasses     Past Surgical History:  Procedure Laterality Date   APPENDECTOMY  1980s   post op bleeding sent him back for ex lap and hemostatic intervention.    BACK SURGERY     8 surgeries total - fused from L1-S1   COLONOSCOPY     Age 64. For bleeding   ESOPHAGOGASTRODUODENOSCOPY N/A 06/10/2015   Procedure: ESOPHAGOGASTRODUODENOSCOPY (EGD);  Surgeon: Lafayette Dragon, MD;  Location: [facility] ENDOSCOPY;  Service: Endoscopy;  Laterality: N/A;   ex lap with peri gastric vessel ligation  1990s   after retching, he rupture a blood vessel "outside" the stomach.  vessel was ligated per pt's descriiption.    INCISIONAL HERNIA REPAIR Right 10/21/2016   Procedure: OPEN FLANK HERNIA REPAIR WITH MESH;  Surgeon: Ralene Ok, MD;  Location: [place];  Service: General;  Laterality: Right;   INSERTION OF MESH Right 10/21/2016   Procedure: INSERTION OF MESH;  Surgeon: Ralene Ok, MD;  Location: [place];  Service: General;  Laterality: Right;   KNEE SURGERY     Multiple bilateral scopes    RETINAL DETACHMENT SURGERY Right    SHOULDER SURGERY     4x on the left - total shoulder replacement.    SHOULDER SURGERY Left    x2   TOTAL SHOULDER ARTHROPLASTY Left    x2    Family History  Problem Relation Age of Onset   Arthritis Mother    Hyperlipidemia Mother    Hypertension Father    Cancer Father    Skin cancer Father    Colon cancer Maternal Grandmother    Cancer Maternal Grandmother    Hypertension Sister    Hypertension Paternal Grandmother    Heart attack Paternal Grandfather    Heart disease Paternal Grandfather    Diabetes Neg Hx    Stroke Neg Hx    Prostate cancer Neg Hx     Social History   Tobacco Use   Smoking status: Former    Years: 24.00    Types: Cigarettes   Smokeless tobacco: Never  Vaping Use   Vaping Use: Never used  Substance Use Topics   Alcohol use: Not Currently    Alcohol/week: 0.0 standard drinks of alcohol   Drug use: No     Allergies  Allergen Reactions   Fentanyl Other (See Comments)    Hallucinations (with the patch)    Lisinopril Swelling  Lyrica [Pregabalin]     hallucinations   Sulfa Antibiotics Swelling    Review of Systems NEGATIVE UNLESS OTHERWISE INDICATED IN HPI      Objective:     BP 136/86 (BP Location: Left Arm)   Pulse 80   Temp 98.1 F (36.7 C) (Temporal)   Ht '5\' 10"'$  (1.778 m)   Wt 182 lb 6.4 oz (82.7 kg)   SpO2 98%   BMI 26.17 kg/m   Wt Readings from Last 3 Encounters:  07/28/22 182 lb 6.4 oz (82.7 kg)  07/19/22 182 lb 9.6 oz (82.8 kg)  07/08/22 184 lb 12.8 oz (83.8 kg)    BP Readings from Last 3 Encounters:  07/28/22 136/86  07/19/22 (!) 146/90  07/08/22 (!) 142/86     Physical Exam Vitals and nursing note reviewed.  Constitutional:      General: He is not in acute distress.    Appearance: Normal appearance. He is not toxic-appearing.  HENT:     Head: Normocephalic and atraumatic.     Comments: Very faint minimal scar across head from surgery - looks great!     Mouth/Throat:      Pharynx: Oropharynx is clear.  Eyes:     Extraocular Movements: Extraocular movements intact.     Conjunctiva/sclera: Conjunctivae normal.     Pupils: Pupils are equal, round, and reactive to light.  Cardiovascular:     Rate and Rhythm: Normal rate and regular rhythm.     Pulses: Normal pulses.     Heart sounds: Normal heart sounds.  Pulmonary:     Effort: Pulmonary effort is normal.     Breath sounds: Normal breath sounds.  Musculoskeletal:        General: Normal range of motion.     Cervical back: Normal range of motion and neck supple.  Skin:    General: Skin is warm and dry.  Neurological:     General: No focal deficit present.     Mental Status: He is alert and oriented to person, place, and time.  Psychiatric:        Mood and Affect: Mood normal.        Behavior: Behavior is hyperactive.        Assessment & Plan:  Attention deficit hyperactivity disorder (ADHD), combined type Assessment & Plan: Recently started back on treatment with me. Failed Vyvanse due to severe constipation. Doing well with Adderall XR so far.  Plan to increase to 20 mg at this time. Pt aware of risks vs benefits and possible adverse reactions. Close f/up with me in about 2 weeks or prn.   Essential hypertension Assessment & Plan: Blood pressure is normal.  Normal readings at home.  Continue on amlodipine 10 mg daily.   Other orders -     Amphetamine-Dextroamphet ER; Take 1 capsule (20 mg total) by mouth daily.  Dispense: 15 capsule; Refill: 0        Return in about 2 weeks (around 08/11/2022) for recheck in-person or virtual .  This note was prepared with assistance of Dragon voice recognition software. Occasional wrong-word or sound-a-like substitutions may have occurred due to the inherent limitations of voice recognition software.     Makeshia Seat M Nakiesha Rumsey, PA-C

## 2022-08-02 ENCOUNTER — Encounter: Payer: Self-pay | Admitting: Physician Assistant

## 2022-08-02 NOTE — Telephone Encounter (Signed)
Please see pt msg and advise 

## 2022-08-03 ENCOUNTER — Ambulatory Visit: Payer: 59 | Admitting: Physician Assistant

## 2022-08-03 ENCOUNTER — Other Ambulatory Visit (HOSPITAL_BASED_OUTPATIENT_CLINIC_OR_DEPARTMENT_OTHER): Payer: Self-pay

## 2022-08-03 ENCOUNTER — Other Ambulatory Visit: Payer: Self-pay | Admitting: Physician Assistant

## 2022-08-03 MED ORDER — AMPHETAMINE-DEXTROAMPHET ER 25 MG PO CP24
25.0000 mg | ORAL_CAPSULE | ORAL | 0 refills | Status: DC
  Filled 2022-08-03: qty 15, 15d supply, fill #0

## 2022-08-04 ENCOUNTER — Ambulatory Visit: Payer: Medicare Other | Admitting: Specialist

## 2022-08-09 ENCOUNTER — Ambulatory Visit (INDEPENDENT_AMBULATORY_CARE_PROVIDER_SITE_OTHER): Payer: 59 | Admitting: Physician Assistant

## 2022-08-09 ENCOUNTER — Encounter: Payer: Self-pay | Admitting: Physician Assistant

## 2022-08-09 VITALS — BP 132/84 | HR 82 | Resp 16 | Wt 182.0 lb

## 2022-08-09 DIAGNOSIS — F902 Attention-deficit hyperactivity disorder, combined type: Secondary | ICD-10-CM | POA: Diagnosis not present

## 2022-08-09 NOTE — Patient Instructions (Signed)
Great to see you!  Continue at Adderall XR 25 mg once daily dosing. Send MyChart update in the next 1-2 weeks with update and when you need Rx refilled. Monitor BP at home.  Keep follow up as scheduled.

## 2022-08-09 NOTE — Progress Notes (Signed)
Subjective:    Patient ID: Jeremiah Anderson, male    DOB: 28-Jul-1966, 56 y.o.   MRN: [id number]  Chief Complaint  Patient presents with   Follow-up    HPI Patient is in today for recheck on ADHD. Just coming off a shift, tired this morning. Seems to be doing better with Adderall XR 25 mg before going into work. Patients / co-workers not commenting as much about his hyperactivity and he feels more focused. No adverse issues so far.   Past Medical History:  Diagnosis Date   Allergy    Arthritis    Blood transfusion without reported diagnosis    around age 80 after major nausea secondary to prescribed MS Contin   GERD (gastroesophageal reflux disease)    Hyperlipidemia    Hypertension    Migraines    Wears glasses     Past Surgical History:  Procedure Laterality Date   APPENDECTOMY  1980s   post op bleeding sent him back for ex lap and hemostatic intervention.    BACK SURGERY     8 surgeries total - fused from L1-S1   COLONOSCOPY     Age 27. For bleeding   ESOPHAGOGASTRODUODENOSCOPY N/A 06/10/2015   Procedure: ESOPHAGOGASTRODUODENOSCOPY (EGD);  Surgeon: Lafayette Dragon, MD;  Location: [facility] ENDOSCOPY;  Service: Endoscopy;  Laterality: N/A;   ex lap with peri gastric vessel ligation  1990s   after retching, he rupture a blood vessel "outside" the stomach.  vessel was ligated per pt's descriiption.    INCISIONAL HERNIA REPAIR Right 10/21/2016   Procedure: OPEN FLANK HERNIA REPAIR WITH MESH;  Surgeon: Ralene Ok, MD;  Location: [place];  Service: General;  Laterality: Right;   INSERTION OF MESH Right 10/21/2016   Procedure: INSERTION OF MESH;  Surgeon: Ralene Ok, MD;  Location: [place];  Service: General;  Laterality: Right;   KNEE SURGERY     Multiple bilateral scopes   RETINAL DETACHMENT SURGERY Right    SHOULDER SURGERY     4x on the left - total shoulder replacement.    SHOULDER SURGERY Left    x2   TOTAL SHOULDER ARTHROPLASTY Left    x2    Family History  Problem  Relation Age of Onset   Arthritis Mother    Hyperlipidemia Mother    Hypertension Father    Cancer Father    Skin cancer Father    Colon cancer Maternal Grandmother    Cancer Maternal Grandmother    Hypertension Sister    Hypertension Paternal Grandmother    Heart attack Paternal Grandfather    Heart disease Paternal Grandfather    Diabetes Neg Hx    Stroke Neg Hx    Prostate cancer Neg Hx     Social History   Tobacco Use   Smoking status: Former    Years: 24.00    Types: Cigarettes   Smokeless tobacco: Never  Vaping Use   Vaping Use: Never used  Substance Use Topics   Alcohol use: Not Currently    Alcohol/week: 0.0 standard drinks of alcohol   Drug use: No     Allergies  Allergen Reactions   Fentanyl Other (See Comments)    Hallucinations (with the patch)    Lisinopril Swelling   Lyrica [Pregabalin]     hallucinations   Sulfa Antibiotics Swelling    Review of Systems NEGATIVE UNLESS OTHERWISE INDICATED IN HPI      Objective:     BP 132/84   Pulse 82   Resp  16   Wt 182 lb (82.6 kg)   SpO2 96%   BMI 26.11 kg/m   Wt Readings from Last 3 Encounters:  08/09/22 182 lb (82.6 kg)  07/28/22 182 lb 6.4 oz (82.7 kg)  07/19/22 182 lb 9.6 oz (82.8 kg)    BP Readings from Last 3 Encounters:  08/09/22 132/84  07/28/22 136/86  07/19/22 (!) 146/90     Physical Exam Constitutional:      Appearance: Normal appearance.  Cardiovascular:     Rate and Rhythm: Normal rate and regular rhythm.  Pulmonary:     Effort: Pulmonary effort is normal.     Breath sounds: Normal breath sounds.  Neurological:     Mental Status: He is alert.  Psychiatric:        Mood and Affect: Mood normal.        Behavior: Behavior normal.        Assessment & Plan:  Attention deficit hyperactivity disorder (ADHD), combined type Assessment & Plan: Improving Cont Adderall XR 25 mg daily Update through Mychart next week Keep f/up as scheduled        Return as  scheduled.  This note was prepared with assistance of Systems analyst. Occasional wrong-word or sound-a-like substitutions may have occurred due to the inherent limitations of voice recognition software.     Mcgregor Tinnon M Malique Driskill, PA-C

## 2022-08-09 NOTE — Assessment & Plan Note (Signed)
Improving Cont Adderall XR 25 mg daily Update through Mychart next week Keep f/up as scheduled

## 2022-08-11 ENCOUNTER — Ambulatory Visit: Payer: 59 | Admitting: Physician Assistant

## 2022-08-15 ENCOUNTER — Encounter: Payer: Self-pay | Admitting: Physician Assistant

## 2022-08-15 ENCOUNTER — Other Ambulatory Visit: Payer: Self-pay | Admitting: Physician Assistant

## 2022-08-15 ENCOUNTER — Other Ambulatory Visit (HOSPITAL_BASED_OUTPATIENT_CLINIC_OR_DEPARTMENT_OTHER): Payer: Self-pay

## 2022-08-15 MED ORDER — AMPHETAMINE-DEXTROAMPHET ER 30 MG PO CP24
30.0000 mg | ORAL_CAPSULE | Freq: Every day | ORAL | 0 refills | Status: DC
Start: 2022-08-15 — End: 2022-09-09

## 2022-08-15 NOTE — Telephone Encounter (Signed)
Please see pt msg and advise 

## 2022-08-21 ENCOUNTER — Encounter: Payer: Self-pay | Admitting: Physician Assistant

## 2022-08-22 ENCOUNTER — Other Ambulatory Visit: Payer: Self-pay

## 2022-08-22 ENCOUNTER — Other Ambulatory Visit (HOSPITAL_BASED_OUTPATIENT_CLINIC_OR_DEPARTMENT_OTHER): Payer: Self-pay

## 2022-08-22 MED ORDER — AMLODIPINE BESYLATE 10 MG PO TABS
10.0000 mg | ORAL_TABLET | Freq: Every day | ORAL | 1 refills | Status: DC
  Filled 2022-08-22: qty 90, 90d supply, fill #0
  Filled 2022-12-07: qty 90, 90d supply, fill #1

## 2022-08-22 NOTE — Telephone Encounter (Signed)
Ok to send new script for Amlodipine to [facility]?

## 2022-08-26 ENCOUNTER — Other Ambulatory Visit: Payer: Self-pay | Admitting: Physician Assistant

## 2022-08-29 NOTE — Telephone Encounter (Signed)
Last OV: 08/09/22  Next OV: 10/03/22  Last filled: 03/31/22  Quantity: 30 w/ 3 refills

## 2022-08-31 ENCOUNTER — Ambulatory Visit (INDEPENDENT_AMBULATORY_CARE_PROVIDER_SITE_OTHER): Payer: 59 | Admitting: Specialist

## 2022-08-31 ENCOUNTER — Ambulatory Visit: Payer: Self-pay

## 2022-08-31 ENCOUNTER — Encounter: Payer: Self-pay | Admitting: Specialist

## 2022-08-31 VITALS — BP 154/95 | HR 79 | Ht 70.0 in | Wt 192.0 lb

## 2022-08-31 DIAGNOSIS — M5134 Other intervertebral disc degeneration, thoracic region: Secondary | ICD-10-CM

## 2022-08-31 DIAGNOSIS — M25552 Pain in left hip: Secondary | ICD-10-CM | POA: Diagnosis not present

## 2022-08-31 DIAGNOSIS — M4325 Fusion of spine, thoracolumbar region: Secondary | ICD-10-CM

## 2022-08-31 DIAGNOSIS — M1612 Unilateral primary osteoarthritis, left hip: Secondary | ICD-10-CM

## 2022-08-31 DIAGNOSIS — M5135 Other intervertebral disc degeneration, thoracolumbar region: Secondary | ICD-10-CM

## 2022-08-31 DIAGNOSIS — M545 Low back pain, unspecified: Secondary | ICD-10-CM

## 2022-08-31 DIAGNOSIS — G8929 Other chronic pain: Secondary | ICD-10-CM

## 2022-08-31 MED ORDER — ETODOLAC 400 MG PO TABS: 400.0000 mg | ORAL_TABLET | Freq: Two times a day (BID) | ORAL | 3 refills | Status: DC

## 2022-08-31 NOTE — Progress Notes (Signed)
Office Visit Note   Patient: Jeremiah Anderson           Date of Birth: 1965/12/17           MRN: [id number] Visit Date: 08/31/2022              Requested by: Allwardt, Randa Evens, PA-C [place],  [place] [address] PCP: Fredirick Lathe, PA-C   Assessment & Plan: Visit Diagnoses:  1. Chronic low back pain, unspecified back pain laterality, unspecified whether sciatica present   2. DDD (degenerative disc disease), thoracic   3. Fusion of spine of thoracolumbar region   4. Pain of left hip   5. Unilateral primary osteoarthritis, left hip   6. DDD (degenerative disc disease), thoracolumbar     Plan: Avoid bending, stooping and avoid lifting weights greater than 10 lbs. Avoid prolong standing and walking. Avoid frequent bending and stooping  No lifting greater than 10 lbs. May use ice or moist heat for pain. Weight loss is of benefit. Ice the hip that is suffering from osteoarthritis, only real proven treatments are Weight loss, NSIADs like lodine and exercise. Well padded shoes help. Ice the hip 2-3 times a day 15-20 mins at a time.-3 times a day 15-20 mins at a time. Hot showers in the AM.  Injection of the left hip with steroid may be of benefit. Hemp CBD capsules, amazon.com 5,000-7,000 mg per bottle, 60 capsules per bottle, take one capsule twice a day. Cane in the right hand to use with left leg weight bearing. Return visit with Dr. Lorin Mercy for left hip pain and proximal to lumbar fusion DDD and mild assymetric spondylosis. Follow-Up Instructions: No follow-ups on file.   Discontinued meloxicam and started lodine.   Follow-Up Instructions: No follow-ups on file.   Orders:  Orders Placed This Encounter  Procedures   XR Lumbar Spine 2-3 Views   XR Pelvis 1-2 Views   No orders of the defined types were placed in this encounter.     Procedures: No procedures performed   Clinical Data: No additional findings.   Subjective: No chief complaint on  file.   56 year old male with history of Thoracolumbar fusion with anterior cages and posteior instrumentation T12 to S1. Surgery done in [place]. He had a right flank hernia and now is having left lateral trochanteric and lateral gluteal pain. No numbness or tingling or Bowel or bladder difficulty.     Review of Systems  Constitutional: Negative.   HENT: Negative.    Eyes: Negative.   Respiratory: Negative.    Cardiovascular: Negative.   Gastrointestinal: Negative.   Endocrine: Negative.   Genitourinary: Negative.   Musculoskeletal: Negative.   Skin: Negative.   Allergic/Immunologic: Negative.   Neurological: Negative.   Hematological: Negative.   Psychiatric/Behavioral: Negative.       Objective: Vital Signs: BP (!) 154/95   Pulse 79   Ht '5\' 10"'$  (1.778 m)   Wt 192 lb (87.1 kg)   BMI 27.55 kg/m   Physical Exam Constitutional:      Appearance: He is well-developed.  HENT:     Head: Normocephalic and atraumatic.  Eyes:     Pupils: Pupils are equal, round, and reactive to light.  Pulmonary:     Effort: Pulmonary effort is normal.     Breath sounds: Normal breath sounds.  Abdominal:     General: Bowel sounds are normal.     Palpations: Abdomen is soft.  Musculoskeletal:  Cervical back: Normal range of motion and neck supple.  Skin:    General: Skin is warm and dry.  Neurological:     Mental Status: He is alert and oriented to person, place, and time.  Psychiatric:        Behavior: Behavior normal.        Thought Content: Thought content normal.        Judgment: Judgment normal.     Back Exam   Tenderness  The patient is experiencing tenderness in the lumbar.  Range of Motion  Extension:  abnormal  Flexion:  abnormal  Lateral bend right:  abnormal  Lateral bend left:  abnormal  Rotation right:  abnormal  Rotation left:  abnormal   Muscle Strength  Right Quadriceps:  5/5  Left Quadriceps:  5/5  Right Hamstrings:  5/5  Left Hamstrings:  5/5        Specialty Comments:  No specialty comments available.  Imaging: No results found.   PMFS History: Patient Active Problem List   Diagnosis Date Noted   Testosterone deficiency 11/04/2020   Vitamin D deficiency 11/04/2020   Attention deficit hyperactivity disorder (ADHD), combined type 11/04/2020   Elevated LFTs 03/23/2020   Fusion of spine of lumbar region 10/31/2019   Spondylolysis of cervical region 10/31/2019   Chronic, continuous use of opioids 07/17/2019   Facet arthropathy, cervical 07/17/2019   Neck pain 12/17/2017   Patellar tendinitis of right knee 08/18/2017   Medicare annual wellness visit, subsequent 01/27/2017   Abscess of nasal cavity 06/14/2016   Right flank mass 06/14/2016   Tobacco use disorder 06/14/2016   Overweight (BMI 25.0-29.9) 06/14/2016   Sinusitis, chronic 08/12/2015   Arthralgia 07/06/2015   Chronic back pain 06/10/2015   Multiple duodenal ulcers 06/10/2015   Hyperlipidemia 05/07/2015   Migraines 04/21/2015   Essential hypertension 02/16/2015   Impingement syndrome of shoulder 08/07/2014   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   Past Medical History:  Diagnosis Date   Allergy    Arthritis    Blood transfusion without reported diagnosis    around age 34 after major nausea secondary to prescribed MS Contin   GERD (gastroesophageal reflux disease)    Hyperlipidemia    Hypertension    Migraines    Wears glasses     Family History  Problem Relation Age of Onset   Arthritis Mother    Hyperlipidemia Mother    Hypertension Father    Cancer Father    Skin cancer Father    Colon cancer Maternal Grandmother    Cancer Maternal Grandmother    Hypertension Sister    Hypertension Paternal Grandmother    Heart attack Paternal Grandfather    Heart disease Paternal Grandfather    Diabetes Neg Hx    Stroke Neg Hx    Prostate cancer Neg Hx     Past Surgical History:  Procedure Laterality Date   APPENDECTOMY  1980s   post op bleeding  sent him back for ex lap and hemostatic intervention.    BACK SURGERY     8 surgeries total - fused from L1-S1   COLONOSCOPY     Age 17. For bleeding   ESOPHAGOGASTRODUODENOSCOPY N/A 06/10/2015   Procedure: ESOPHAGOGASTRODUODENOSCOPY (EGD);  Surgeon: Lafayette Dragon, MD;  Location: [facility] ENDOSCOPY;  Service: Endoscopy;  Laterality: N/A;   ex lap with peri gastric vessel ligation  1990s   after retching, he rupture a blood vessel "outside" the stomach.  vessel was ligated per pt's descriiption.    INCISIONAL  HERNIA REPAIR Right 10/21/2016   Procedure: OPEN FLANK HERNIA REPAIR WITH MESH;  Surgeon: Ralene Ok, MD;  Location: [place];  Service: General;  Laterality: Right;   INSERTION OF MESH Right 10/21/2016   Procedure: INSERTION OF MESH;  Surgeon: Ralene Ok, MD;  Location: [place];  Service: General;  Laterality: Right;   KNEE SURGERY     Multiple bilateral scopes   RETINAL DETACHMENT SURGERY Right    SHOULDER SURGERY     4x on the left - total shoulder replacement.    SHOULDER SURGERY Left    x2   TOTAL SHOULDER ARTHROPLASTY Left    x2   Social History   Occupational History   Occupation: Disability    Comment: Former Therapist, sports  Tobacco Use   Smoking status: Former    Years: 24.00    Types: Cigarettes   Smokeless tobacco: Never  Vaping Use   Vaping Use: Never used  Substance and Sexual Activity   Alcohol use: Not Currently    Alcohol/week: 0.0 standard drinks of alcohol   Drug use: No   Sexual activity: Not on file

## 2022-08-31 NOTE — Patient Instructions (Addendum)
Avoid bending, stooping and avoid lifting weights greater than 10 lbs. Avoid prolong standing and walking. Avoid frequent bending and stooping  No lifting greater than 10 lbs. May use ice or moist heat for pain. Weight loss is of benefit. Ice the hip that is suffering from osteoarthritis, only real proven treatments are Weight loss, NSIADs like lodine and exercise. Well padded shoes help. Ice the hip 2-3 times a day 15-20 mins at a time.-3 times a day 15-20 mins at a time. Hot showers in the AM.  Injection of the left hip with steroid may be of benefit. Hemp CBD capsules, amazon.com 5,000-7,000 mg per bottle, 60 capsules per bottle, take one capsule twice a day. Cane in the right hand to use with left leg weight bearing. Return visit with Dr. Lorin Mercy for left hip pain and proximal to lumbar fusion DDD and mild assymetric spondylosis. Discontinued meloxicam and started lodine.  Follow-Up Instructions: No follow-ups on file.

## 2022-09-07 ENCOUNTER — Encounter: Payer: Self-pay | Admitting: Physician Assistant

## 2022-09-08 NOTE — Telephone Encounter (Signed)
Please see pt msg and advise 

## 2022-09-09 ENCOUNTER — Other Ambulatory Visit: Payer: Self-pay | Admitting: Physician Assistant

## 2022-09-09 MED ORDER — AMPHETAMINE-DEXTROAMPHET ER 5 MG PO CP24: 5.0000 mg | ORAL_CAPSULE | Freq: Every day | ORAL | 0 refills | Status: DC

## 2022-09-09 MED ORDER — AMPHETAMINE-DEXTROAMPHET ER 30 MG PO CP24: 30.0000 mg | ORAL_CAPSULE | Freq: Every day | ORAL | 0 refills | Status: DC

## 2022-09-09 NOTE — Telephone Encounter (Signed)
Please see pt response, thanks

## 2022-09-12 ENCOUNTER — Other Ambulatory Visit: Payer: Self-pay | Admitting: Physician Assistant

## 2022-09-12 ENCOUNTER — Other Ambulatory Visit (HOSPITAL_BASED_OUTPATIENT_CLINIC_OR_DEPARTMENT_OTHER): Payer: Self-pay

## 2022-09-12 MED ORDER — AMPHETAMINE-DEXTROAMPHET ER 5 MG PO CP24
5.0000 mg | ORAL_CAPSULE | Freq: Every day | ORAL | 0 refills | Status: DC
  Filled 2022-09-12 – 2022-09-23 (×2): qty 15, 15d supply, fill #0

## 2022-09-12 MED ORDER — AMPHETAMINE-DEXTROAMPHET ER 30 MG PO CP24
30.0000 mg | ORAL_CAPSULE | Freq: Every day | ORAL | 0 refills | Status: DC
  Filled 2022-09-12: qty 15, 15d supply, fill #0

## 2022-09-12 NOTE — Telephone Encounter (Signed)
Patient was told adderral xr '30mg'$  needs to be sent to [place] due to patient having 2x insurances.

## 2022-09-13 ENCOUNTER — Other Ambulatory Visit (HOSPITAL_BASED_OUTPATIENT_CLINIC_OR_DEPARTMENT_OTHER): Payer: Self-pay

## 2022-09-13 MED ORDER — BUTALBITAL-APAP-CAFFEINE 50-325-40 MG PO TABS
1.0000 | ORAL_TABLET | Freq: Four times a day (QID) | ORAL | 1 refills | Status: DC | PRN
  Filled 2022-09-14: qty 120, 30d supply, fill #0

## 2022-09-14 ENCOUNTER — Other Ambulatory Visit (HOSPITAL_BASED_OUTPATIENT_CLINIC_OR_DEPARTMENT_OTHER): Payer: Self-pay

## 2022-09-23 ENCOUNTER — Other Ambulatory Visit: Payer: Self-pay | Admitting: Physician Assistant

## 2022-09-23 ENCOUNTER — Other Ambulatory Visit (HOSPITAL_BASED_OUTPATIENT_CLINIC_OR_DEPARTMENT_OTHER): Payer: Self-pay

## 2022-09-23 ENCOUNTER — Encounter: Payer: Self-pay | Admitting: Physician Assistant

## 2022-09-23 MED ORDER — MELOXICAM 15 MG PO TABS
15.0000 mg | ORAL_TABLET | Freq: Every day | ORAL | 2 refills | Status: DC | PRN
  Filled 2022-09-23: qty 30, 30d supply, fill #0
  Filled 2022-11-20: qty 30, 30d supply, fill #1

## 2022-09-24 ENCOUNTER — Other Ambulatory Visit: Payer: Self-pay | Admitting: Physician Assistant

## 2022-09-24 ENCOUNTER — Other Ambulatory Visit (HOSPITAL_BASED_OUTPATIENT_CLINIC_OR_DEPARTMENT_OTHER): Payer: Self-pay

## 2022-09-24 MED ORDER — AMPHETAMINE-DEXTROAMPHET ER 20 MG PO CP24
40.0000 mg | ORAL_CAPSULE | Freq: Every day | ORAL | 0 refills | Status: DC
  Filled 2022-09-24: qty 60, 30d supply, fill #0

## 2022-09-27 ENCOUNTER — Telehealth: Payer: Self-pay | Admitting: Physician Assistant

## 2022-09-27 NOTE — Telephone Encounter (Signed)
Copied from [place] [id number]. Topic: Medicare AWV >> Sep 27, 2022 12:40 PM Gillis Santa wrote: Reason for CRM:  LVM FOR PATIENT TO CALL [phone] TO SCHEDULE AWV WITH HEALTH COACH

## 2022-10-03 ENCOUNTER — Ambulatory Visit: Payer: Medicare Other | Admitting: Physician Assistant

## 2022-10-07 ENCOUNTER — Ambulatory Visit (INDEPENDENT_AMBULATORY_CARE_PROVIDER_SITE_OTHER): Payer: 59 | Admitting: Physician Assistant

## 2022-10-07 ENCOUNTER — Encounter: Payer: Self-pay | Admitting: Physician Assistant

## 2022-10-07 VITALS — BP 134/86 | HR 105 | Temp 97.3°F | Ht 70.0 in | Wt 170.8 lb

## 2022-10-07 DIAGNOSIS — R569 Unspecified convulsions: Secondary | ICD-10-CM | POA: Insufficient documentation

## 2022-10-07 DIAGNOSIS — M4302 Spondylolysis, cervical region: Secondary | ICD-10-CM

## 2022-10-07 DIAGNOSIS — F902 Attention-deficit hyperactivity disorder, combined type: Secondary | ICD-10-CM | POA: Diagnosis not present

## 2022-10-07 DIAGNOSIS — D329 Benign neoplasm of meninges, unspecified: Secondary | ICD-10-CM | POA: Insufficient documentation
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
## 2022-10-10 ENCOUNTER — Encounter: Payer: Self-pay | Admitting: Physician Assistant

## 2022-10-10 NOTE — Assessment & Plan Note (Signed)
Working with PT regularly, recently having more pain with large-set patient pulling on him. No red flag symptoms, states he will continue to do his usual routine for this and f/up with specialist as needed. Also attributes pain level to feeling less focused today.

## 2022-10-10 NOTE — Assessment & Plan Note (Signed)
PDMP reviewed today, no red flags, filling appropriately.  Currently taking Adderall XR 40 mg daily. Still having significant symptoms, but seeing improvement per patient. Would appreciate consult with psychiatry / behavioral health at this time. Referral placed. Will continue to fill Adderall at this dose until he can see them.

## 2022-10-11 ENCOUNTER — Ambulatory Visit: Payer: 59 | Admitting: Orthopaedic Surgery

## 2022-10-11 NOTE — Telephone Encounter (Signed)
Please see pt response.

## 2022-10-12 ENCOUNTER — Other Ambulatory Visit (HOSPITAL_BASED_OUTPATIENT_CLINIC_OR_DEPARTMENT_OTHER): Payer: Self-pay

## 2022-10-12 MED ORDER — MELOXICAM 15 MG PO TABS
15.0000 mg | ORAL_TABLET | Freq: Every day | ORAL | 2 refills | Status: DC | PRN
  Filled 2022-10-12 – 2022-10-17 (×2): qty 30, 30d supply, fill #0
  Filled 2022-12-18: qty 30, 30d supply, fill #1
  Filled 2023-01-04 – 2023-01-11 (×2): qty 30, 30d supply, fill #2

## 2022-10-12 MED ORDER — BUTALBITAL-APAP-CAFFEINE 50-325-40 MG PO TABS
1.0000 | ORAL_TABLET | Freq: Four times a day (QID) | ORAL | 1 refills | Status: DC | PRN
  Filled 2022-10-12: qty 120, 30d supply, fill #0
  Filled 2022-11-09: qty 120, 30d supply, fill #1

## 2022-10-17 ENCOUNTER — Other Ambulatory Visit (HOSPITAL_BASED_OUTPATIENT_CLINIC_OR_DEPARTMENT_OTHER): Payer: Self-pay

## 2022-10-21 ENCOUNTER — Encounter: Payer: Self-pay | Admitting: Physician Assistant

## 2022-10-21 ENCOUNTER — Other Ambulatory Visit: Payer: Self-pay | Admitting: Physician Assistant

## 2022-10-21 ENCOUNTER — Other Ambulatory Visit (HOSPITAL_BASED_OUTPATIENT_CLINIC_OR_DEPARTMENT_OTHER): Payer: Self-pay

## 2022-10-21 ENCOUNTER — Other Ambulatory Visit: Payer: Self-pay

## 2022-10-21 MED ORDER — AMPHETAMINE-DEXTROAMPHET ER 20 MG PO CP24
40.0000 mg | ORAL_CAPSULE | Freq: Every day | ORAL | 0 refills | Status: DC
  Filled 2022-10-21 – 2022-10-24 (×2): qty 60, 30d supply, fill #0

## 2022-10-21 MED ORDER — FLUTICASONE PROPIONATE 50 MCG/ACT NA SUSP
2.0000 | Freq: Every day | NASAL | 11 refills | Status: DC
  Filled 2022-10-21: qty 16, 30d supply, fill #0
  Filled 2022-11-20: qty 16, 30d supply, fill #1
  Filled 2023-02-07: qty 16, 30d supply, fill #2
  Filled 2023-04-24: qty 16, 30d supply, fill #3
  Filled 2023-07-31: qty 16, 30d supply, fill #4
  Filled 2023-09-03: qty 16, 30d supply, fill #5
  Filled 2023-10-02: qty 16, 30d supply, fill #6

## 2022-10-21 NOTE — Telephone Encounter (Signed)
See patient msg and advise

## 2022-10-24 ENCOUNTER — Other Ambulatory Visit (HOSPITAL_BASED_OUTPATIENT_CLINIC_OR_DEPARTMENT_OTHER): Payer: Self-pay

## 2022-10-26 ENCOUNTER — Ambulatory Visit (INDEPENDENT_AMBULATORY_CARE_PROVIDER_SITE_OTHER): Payer: 59 | Admitting: Orthopaedic Surgery

## 2022-10-26 ENCOUNTER — Encounter: Payer: Self-pay | Admitting: Orthopaedic Surgery

## 2022-10-26 VITALS — BP 162/96 | Ht 70.0 in | Wt 170.0 lb

## 2022-10-26 DIAGNOSIS — M4326 Fusion of spine, lumbar region: Secondary | ICD-10-CM | POA: Diagnosis not present

## 2022-10-26 NOTE — Progress Notes (Signed)
Office Visit Note   Patient: Jeremiah Anderson           Date of Birth: 03-11-1966           MRN: [id number] Visit Date: 10/26/2022              Requested by: Allwardt, Randa Evens, PA-C [place],   [id number] PCP: Fredirick Lathe, PA-C   Assessment & Plan: Visit Diagnoses:  1. Fusion of spine of lumbar region     Plan: We reviewed MRI scan of his neck which shows some foraminal narrowing and spondylosis multilevel.  No central areas of significant compression.  Plain radiographs and CT scan abdomen reviewed which documents his multilevel lumbar fusion.  All hardware is intact no hardware breakage.  Thoracic MRI and CT scan show some multilevel endplate spurring and spondylosis in the thoracic region.  He can continue conservative treatment currently getting some dry needling and other physical modality treatments.  No neurogenic claudication symptoms.  He can follow-up as needed.  Follow-Up Instructions: No follow-ups on file.   Orders:  No orders of the defined types were placed in this encounter.  No orders of the defined types were placed in this encounter.     Procedures: No procedures performed   Clinical Data: No additional findings.   Subjective: Chief Complaint  Patient presents with   Right Leg - Pain   Right Hip - Pain    HPI 56 year old male has been followed by Dr. 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  Had previous surgeries in [place] and is fused from L1-S1.  He has interbody cages at L1-2, L2-3, and L4-5 with posterior instrumentation L1-S1.  He continues to walk is not noticed any weakness at 1 point he had some foot drop but this significantly improved.  Review of Systems past history of meningioma last MRI scan did not show any definite recurrence and he is on yearly MRIs.  This was frontal lobe.  All the systems are  noncontributory to HPI.   Objective: Vital Signs: BP (!) 162/96   Ht '5\' 10"'$  (1.778 m)   Wt 170 lb (77.1 kg)   BMI 24.39 kg/m   Physical Exam Constitutional:      Appearance: He is well-developed.  HENT:     Head: Normocephalic and atraumatic.     Right Ear: External ear normal.     Left Ear: External ear normal.  Eyes:     Pupils: Pupils are equal, round, and reactive to light.  Neck:     Thyroid: No thyromegaly.     Trachea: No tracheal deviation.  Cardiovascular:     Rate and Rhythm: Normal rate.  Pulmonary:     Effort: Pulmonary effort is normal.     Breath sounds: No wheezing.  Abdominal:     General: Bowel sounds are normal.     Palpations: Abdomen is soft.  Musculoskeletal:     Cervical back: Neck supple.  Skin:    General: Skin is warm and dry.     Capillary Refill: Capillary refill takes less than 2 seconds.  Neurological:     Mental Status: He is alert and oriented to person, place, and time.  Psychiatric:        Behavior: Behavior normal.        Thought Content: Thought content normal.  Judgment: Judgment normal.     Ortho Exam healed lumbar incision he can easily walk on his heels and toes no anterior tib weakness right or left.  EHL is strong.  Minimal tenderness over the trochanter.  Lateral position abduction strength is good.  Negative logroll of the hips.  Good hip flexion strength.  Specialty Comments:  No specialty comments available.  Imaging: No results found.   PMFS History: Patient Active Problem List   Diagnosis Date Noted   Meningioma ([facility]) 10/07/2022   Seizure ([facility]) 10/07/2022   Testosterone deficiency 11/04/2020   Vitamin D deficiency 11/04/2020   Attention deficit hyperactivity disorder (ADHD), combined type 11/04/2020   Morton's neuroma of right foot 08/05/2020   Elevated LFTs 03/23/2020   Fusion of spine of lumbar region 10/31/2019   Spondylolysis of cervical region 10/31/2019   Chronic, continuous use of opioids  07/17/2019   Facet arthropathy, cervical 07/17/2019   Neck pain 12/17/2017   Patellar tendinitis of right knee 08/18/2017   Medicare annual wellness visit, subsequent 01/27/2017   Abscess of nasal cavity 06/14/2016   Right flank mass 06/14/2016   Tobacco use disorder 06/14/2016   Overweight (BMI 25.0-29.9) 06/14/2016   Sinusitis, chronic 08/12/2015   Arthralgia 07/06/2015   Chronic back pain 06/10/2015   Multiple duodenal ulcers 06/10/2015   Hyperlipidemia 05/07/2015   Migraines 04/21/2015   Essential hypertension 02/16/2015   Impingement syndrome of shoulder 08/07/2014   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   Past Medical History:  Diagnosis Date   Allergy    Arthritis    Blood transfusion without reported diagnosis    around age 68 after major nausea secondary to prescribed MS Contin   GERD (gastroesophageal reflux disease)    Hyperlipidemia    Hypertension    Migraines    Wears glasses     Family History  Problem Relation Age of Onset   Arthritis Mother    Hyperlipidemia Mother    Hypertension Father    Cancer Father    Skin cancer Father    Colon cancer Maternal Grandmother    Cancer Maternal Grandmother    Hypertension Sister    Hypertension Paternal Grandmother    Heart attack Paternal Grandfather    Heart disease Paternal Grandfather    Diabetes Neg Hx    Stroke Neg Hx    Prostate cancer Neg Hx     Past Surgical History:  Procedure Laterality Date   APPENDECTOMY  1980s   post op bleeding sent him back for ex lap and hemostatic intervention.    BACK SURGERY     8 surgeries total - fused from L1-S1   COLONOSCOPY     Age 31. For bleeding   ESOPHAGOGASTRODUODENOSCOPY N/A 06/10/2015   Procedure: ESOPHAGOGASTRODUODENOSCOPY (EGD);  Surgeon: Lafayette Dragon, MD;  Location: [facility] ENDOSCOPY;  Service: Endoscopy;  Laterality: N/A;   ex lap with peri gastric vessel ligation  1990s   after retching, he rupture a blood vessel "outside" the stomach.  vessel was ligated per  pt's descriiption.    INCISIONAL HERNIA REPAIR Right 10/21/2016   Procedure: OPEN FLANK HERNIA REPAIR WITH MESH;  Surgeon: Ralene Ok, MD;  Location: [facility];  Service: General;  Laterality: Right;   INSERTION OF MESH Right 10/21/2016   Procedure: INSERTION OF MESH;  Surgeon: Ralene Ok, MD;  Location: [facility];  Service: General;  Laterality: Right;   KNEE SURGERY     Multiple bilateral scopes   RETINAL DETACHMENT SURGERY Right    SHOULDER SURGERY  4x on the left - total shoulder replacement.    SHOULDER SURGERY Left    x2   TOTAL SHOULDER ARTHROPLASTY Left    x2   Social History   Occupational History   Occupation: Disability    Comment: Former Therapist, sports  Tobacco Use   Smoking status: Former    Years: 24.00    Types: Cigarettes   Smokeless tobacco: Never  Vaping Use   Vaping Use: Never used  Substance and Sexual Activity   Alcohol use: Not Currently    Alcohol/week: 0.0 standard drinks of alcohol   Drug use: No   Sexual activity: Not on file

## 2022-11-09 ENCOUNTER — Telehealth: Payer: Self-pay | Admitting: Physician Assistant

## 2022-11-09 ENCOUNTER — Other Ambulatory Visit (HOSPITAL_BASED_OUTPATIENT_CLINIC_OR_DEPARTMENT_OTHER): Payer: Self-pay

## 2022-11-09 NOTE — Telephone Encounter (Signed)
Copied from [facility] [id number]. Topic: Medicare AWV >> Nov 09, 2022  1:52 PM Gillis Santa wrote: Reason for CRM: LVM PATIENT TO CALL [phone] SCHEDULE AWV WITH TINA

## 2022-11-17 ENCOUNTER — Encounter: Payer: Self-pay | Admitting: Physician Assistant

## 2022-11-17 NOTE — Telephone Encounter (Signed)
Please see pt msg and send ADHD meds to [facility] per pt request. [facility] will be closed due to New Years holiday

## 2022-11-18 ENCOUNTER — Encounter: Payer: Self-pay | Admitting: Physician Assistant

## 2022-11-20 ENCOUNTER — Other Ambulatory Visit: Payer: Self-pay | Admitting: Physician Assistant

## 2022-11-20 ENCOUNTER — Encounter: Payer: Self-pay | Admitting: Physician Assistant

## 2022-11-21 ENCOUNTER — Other Ambulatory Visit: Payer: Self-pay

## 2022-11-22 ENCOUNTER — Encounter: Payer: Self-pay | Admitting: Physician Assistant

## 2022-11-22 ENCOUNTER — Other Ambulatory Visit (HOSPITAL_BASED_OUTPATIENT_CLINIC_OR_DEPARTMENT_OTHER): Payer: Self-pay

## 2022-11-22 MED ORDER — AMPHETAMINE-DEXTROAMPHET ER 20 MG PO CP24
40.0000 mg | ORAL_CAPSULE | Freq: Every day | ORAL | 0 refills | Status: DC
  Filled 2022-11-22 – 2022-11-23 (×2): qty 60, 30d supply, fill #0

## 2022-11-23 ENCOUNTER — Other Ambulatory Visit: Payer: Self-pay

## 2022-11-23 ENCOUNTER — Other Ambulatory Visit (HOSPITAL_BASED_OUTPATIENT_CLINIC_OR_DEPARTMENT_OTHER): Payer: Self-pay

## 2022-11-23 DIAGNOSIS — D329 Benign neoplasm of meninges, unspecified: Secondary | ICD-10-CM

## 2022-11-23 DIAGNOSIS — L989 Disorder of the skin and subcutaneous tissue, unspecified: Secondary | ICD-10-CM

## 2022-11-23 DIAGNOSIS — R948 Abnormal results of function studies of other organs and systems: Secondary | ICD-10-CM | POA: Diagnosis not present

## 2022-11-28 DIAGNOSIS — M5136 Other intervertebral disc degeneration, lumbar region: Secondary | ICD-10-CM | POA: Diagnosis not present

## 2022-11-28 DIAGNOSIS — M7651 Patellar tendinitis, right knee: Secondary | ICD-10-CM | POA: Diagnosis not present

## 2022-11-28 DIAGNOSIS — R531 Weakness: Secondary | ICD-10-CM | POA: Diagnosis not present

## 2022-11-28 DIAGNOSIS — M546 Pain in thoracic spine: Secondary | ICD-10-CM | POA: Diagnosis not present

## 2022-11-30 DIAGNOSIS — N486 Induration penis plastica: Secondary | ICD-10-CM | POA: Diagnosis not present

## 2022-11-30 DIAGNOSIS — N5201 Erectile dysfunction due to arterial insufficiency: Secondary | ICD-10-CM | POA: Diagnosis not present

## 2022-11-30 DIAGNOSIS — E291 Testicular hypofunction: Secondary | ICD-10-CM | POA: Diagnosis not present

## 2022-12-01 ENCOUNTER — Other Ambulatory Visit: Payer: Self-pay | Admitting: Physician Assistant

## 2022-12-06 DIAGNOSIS — M5136 Other intervertebral disc degeneration, lumbar region: Secondary | ICD-10-CM | POA: Diagnosis not present

## 2022-12-06 DIAGNOSIS — R531 Weakness: Secondary | ICD-10-CM | POA: Diagnosis not present

## 2022-12-06 DIAGNOSIS — M7651 Patellar tendinitis, right knee: Secondary | ICD-10-CM | POA: Diagnosis not present

## 2022-12-06 DIAGNOSIS — M546 Pain in thoracic spine: Secondary | ICD-10-CM | POA: Diagnosis not present

## 2022-12-07 ENCOUNTER — Other Ambulatory Visit (HOSPITAL_BASED_OUTPATIENT_CLINIC_OR_DEPARTMENT_OTHER): Payer: Self-pay

## 2022-12-07 DIAGNOSIS — M47818 Spondylosis without myelopathy or radiculopathy, sacral and sacrococcygeal region: Secondary | ICD-10-CM | POA: Diagnosis not present

## 2022-12-07 DIAGNOSIS — M62830 Muscle spasm of back: Secondary | ICD-10-CM | POA: Diagnosis not present

## 2022-12-07 DIAGNOSIS — Z79891 Long term (current) use of opiate analgesic: Secondary | ICD-10-CM | POA: Diagnosis not present

## 2022-12-07 DIAGNOSIS — G894 Chronic pain syndrome: Secondary | ICD-10-CM | POA: Diagnosis not present

## 2022-12-07 MED ORDER — BUTALBITAL-APAP-CAFFEINE 50-325-40 MG PO TABS
1.0000 | ORAL_TABLET | Freq: Four times a day (QID) | ORAL | 1 refills | Status: DC | PRN
  Filled 2022-12-07: qty 100, 25d supply, fill #0
  Filled 2022-12-07: qty 20, 5d supply, fill #0
  Filled 2023-01-01 – 2023-01-04 (×2): qty 120, 30d supply, fill #1
  Filled 2023-01-04: qty 111, 27d supply, fill #1
  Filled 2023-01-04 (×2): qty 9, 3d supply, fill #1

## 2022-12-09 DIAGNOSIS — M4602 Spinal enthesopathy, cervical region: Secondary | ICD-10-CM | POA: Diagnosis not present

## 2022-12-09 DIAGNOSIS — M7912 Myalgia of auxiliary muscles, head and neck: Secondary | ICD-10-CM | POA: Diagnosis not present

## 2022-12-14 DIAGNOSIS — M546 Pain in thoracic spine: Secondary | ICD-10-CM | POA: Diagnosis not present

## 2022-12-14 DIAGNOSIS — M7651 Patellar tendinitis, right knee: Secondary | ICD-10-CM | POA: Diagnosis not present

## 2022-12-14 DIAGNOSIS — R531 Weakness: Secondary | ICD-10-CM | POA: Diagnosis not present

## 2022-12-14 DIAGNOSIS — M5136 Other intervertebral disc degeneration, lumbar region: Secondary | ICD-10-CM | POA: Diagnosis not present

## 2022-12-15 ENCOUNTER — Other Ambulatory Visit (HOSPITAL_BASED_OUTPATIENT_CLINIC_OR_DEPARTMENT_OTHER): Payer: Self-pay

## 2022-12-15 ENCOUNTER — Ambulatory Visit (HOSPITAL_COMMUNITY): Payer: Commercial Managed Care - PPO | Admitting: Psychiatry

## 2022-12-15 ENCOUNTER — Encounter (HOSPITAL_COMMUNITY): Payer: Self-pay | Admitting: Psychiatry

## 2022-12-15 VITALS — Wt 174.0 lb

## 2022-12-15 DIAGNOSIS — F902 Attention-deficit hyperactivity disorder, combined type: Secondary | ICD-10-CM

## 2022-12-15 MED ORDER — ATOMOXETINE HCL 25 MG PO CAPS
25.0000 mg | ORAL_CAPSULE | Freq: Two times a day (BID) | ORAL | 0 refills | Status: DC
  Filled 2022-12-15: qty 30, 15d supply, fill #0
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
## 2022-12-18 ENCOUNTER — Other Ambulatory Visit: Payer: Self-pay | Admitting: Physician Assistant

## 2022-12-19 ENCOUNTER — Encounter (HOSPITAL_BASED_OUTPATIENT_CLINIC_OR_DEPARTMENT_OTHER): Payer: Self-pay | Admitting: Pharmacist

## 2022-12-19 ENCOUNTER — Other Ambulatory Visit (HOSPITAL_BASED_OUTPATIENT_CLINIC_OR_DEPARTMENT_OTHER): Payer: Self-pay

## 2022-12-19 NOTE — Telephone Encounter (Signed)
Last OV: 10/07/22  Next OV: 01/05/23  Last filled: 11/22/22  Quantity: 60 w/ 0 refills

## 2022-12-20 ENCOUNTER — Encounter (HOSPITAL_BASED_OUTPATIENT_CLINIC_OR_DEPARTMENT_OTHER): Payer: Self-pay | Admitting: Pharmacist

## 2022-12-20 ENCOUNTER — Other Ambulatory Visit (HOSPITAL_BASED_OUTPATIENT_CLINIC_OR_DEPARTMENT_OTHER): Payer: Self-pay

## 2022-12-20 ENCOUNTER — Other Ambulatory Visit: Payer: Self-pay | Admitting: Physician Assistant

## 2022-12-21 ENCOUNTER — Other Ambulatory Visit (HOSPITAL_BASED_OUTPATIENT_CLINIC_OR_DEPARTMENT_OTHER): Payer: Self-pay

## 2022-12-22 ENCOUNTER — Other Ambulatory Visit (HOSPITAL_COMMUNITY): Payer: Self-pay

## 2022-12-22 ENCOUNTER — Other Ambulatory Visit (HOSPITAL_BASED_OUTPATIENT_CLINIC_OR_DEPARTMENT_OTHER): Payer: Self-pay

## 2022-12-22 ENCOUNTER — Ambulatory Visit (INDEPENDENT_AMBULATORY_CARE_PROVIDER_SITE_OTHER): Payer: Commercial Managed Care - PPO | Admitting: Physician Assistant

## 2022-12-22 ENCOUNTER — Encounter: Payer: Self-pay | Admitting: Physician Assistant

## 2022-12-22 VITALS — BP 138/88 | HR 82 | Temp 97.7°F | Ht 70.0 in | Wt 176.4 lb

## 2022-12-22 DIAGNOSIS — M542 Cervicalgia: Secondary | ICD-10-CM | POA: Diagnosis not present

## 2022-12-22 DIAGNOSIS — M25512 Pain in left shoulder: Secondary | ICD-10-CM

## 2022-12-22 DIAGNOSIS — G8929 Other chronic pain: Secondary | ICD-10-CM

## 2022-12-22 DIAGNOSIS — M7651 Patellar tendinitis, right knee: Secondary | ICD-10-CM | POA: Diagnosis not present

## 2022-12-22 DIAGNOSIS — M5136 Other intervertebral disc degeneration, lumbar region: Secondary | ICD-10-CM | POA: Diagnosis not present

## 2022-12-22 DIAGNOSIS — F902 Attention-deficit hyperactivity disorder, combined type: Secondary | ICD-10-CM

## 2022-12-22 DIAGNOSIS — R531 Weakness: Secondary | ICD-10-CM | POA: Diagnosis not present

## 2022-12-22 DIAGNOSIS — M546 Pain in thoracic spine: Secondary | ICD-10-CM | POA: Diagnosis not present

## 2022-12-22 MED ORDER — AMPHETAMINE-DEXTROAMPHET ER 20 MG PO CP24
20.0000 mg | ORAL_CAPSULE | Freq: Every day | ORAL | 0 refills | Status: DC
  Filled 2022-12-22: qty 30, 30d supply, fill #0

## 2022-12-22 NOTE — Progress Notes (Signed)
Subjective:    Patient ID: Jeremiah Anderson, male    DOB: 1966/07/26, 57 y.o.   MRN: [id number]  Chief Complaint  Patient presents with   ADHD    HPI Patient is in today for ADHD recheck.  Patient had initial consult with Dr. Adele Schilder 12/15/2022.  It was recommended that he reduce his Adderall dosing and start on Strattera.  It was also recommended that he look into neurocognitive testing.    Also tells me he has been having increasing pain in left sided neck and shoulder. [place] physical therapy - needling and traction, but feeling like he needs facet injections.   Past Medical History:  Diagnosis Date   Allergy    Arthritis    Blood transfusion without reported diagnosis    around age 51 after major nausea secondary to prescribed MS Contin   GERD (gastroesophageal reflux disease)    Hyperlipidemia    Hypertension    Migraines    Wears glasses     Past Surgical History:  Procedure Laterality Date   APPENDECTOMY  1980s   post op bleeding sent him back for ex lap and hemostatic intervention.    BACK SURGERY     8 surgeries total - fused from L1-S1   COLONOSCOPY     Age 44. For bleeding   ESOPHAGOGASTRODUODENOSCOPY N/A 06/10/2015   Procedure: ESOPHAGOGASTRODUODENOSCOPY (EGD);  Surgeon: Lafayette Dragon, MD;  Location: [facility] ENDOSCOPY;  Service: Endoscopy;  Laterality: N/A;   ex lap with peri gastric vessel ligation  1990s   after retching, he rupture a blood vessel "outside" the stomach.  vessel was ligated per pt's descriiption.    INCISIONAL HERNIA REPAIR Right 10/21/2016   Procedure: OPEN FLANK HERNIA REPAIR WITH MESH;  Surgeon: Ralene Ok, MD;  Location: [place];  Service: General;  Laterality: Right;   INSERTION OF MESH Right 10/21/2016   Procedure: INSERTION OF MESH;  Surgeon: Ralene Ok, MD;  Location: [place];  Service: General;  Laterality: Right;   KNEE SURGERY     Multiple bilateral scopes   RETINAL DETACHMENT SURGERY Right    SHOULDER SURGERY     4x on the left -  total shoulder replacement.    SHOULDER SURGERY Left    x2   TOTAL SHOULDER ARTHROPLASTY Left    x2    Family History  Problem Relation Age of Onset   Arthritis Mother    Hyperlipidemia Mother    Hypertension Father    Cancer Father    Skin cancer Father    Colon cancer Maternal Grandmother    Cancer Maternal Grandmother    Hypertension Sister    Hypertension Paternal Grandmother    Heart attack Paternal Grandfather    Heart disease Paternal Grandfather    Diabetes Neg Hx    Stroke Neg Hx    Prostate cancer Neg Hx     Social History   Tobacco Use   Smoking status: Former    Years: 24.00    Types: Cigarettes   Smokeless tobacco: Never  Vaping Use   Vaping Use: Never used  Substance Use Topics   Alcohol use: Not Currently    Alcohol/week: 0.0 standard drinks of alcohol   Drug use: No     Allergies  Allergen Reactions   Fentanyl Other (See Comments)    Hallucinations (with the patch)  Other Reaction(s): Other (See Comments)  Hears voice   Fentanyl Patch   Lisinopril Swelling   Lyrica [Pregabalin]     hallucinations  Sulfa Antibiotics Swelling    Review of Systems NEGATIVE UNLESS OTHERWISE INDICATED IN HPI      Objective:     BP 138/88   Pulse 82   Temp 97.7 F (36.5 C)   Ht '5\' 10"'$  (1.778 m)   Wt 176 lb 6.4 oz (80 kg)   SpO2 96%   BMI 25.31 kg/m   Wt Readings from Last 3 Encounters:  12/22/22 176 lb 6.4 oz (80 kg)  10/26/22 170 lb (77.1 kg)  10/07/22 170 lb 12.8 oz (77.5 kg)    BP Readings from Last 3 Encounters:  12/22/22 138/88  10/26/22 (!) 162/96  10/07/22 134/86     Physical Exam Constitutional:      Appearance: Normal appearance.  Cardiovascular:     Rate and Rhythm: Normal rate and regular rhythm.  Pulmonary:     Effort: Pulmonary effort is normal.     Breath sounds: Normal breath sounds.  Neurological:     Mental Status: He is alert.  Psychiatric:        Mood and Affect: Mood normal.        Behavior: Behavior is  hyperactive.        Assessment & Plan:  Attention deficit hyperactivity disorder (ADHD), combined type Assessment & Plan: Pt is following with Dr. Adele Schilder Starting on Strattera 25 mg Continue Adderall XR at a reduced dose of 20 mg, refilled this today for him He will check with [facility] about neurocognitive testing  PDMP reviewed today, no red flags, filling appropriately.     Neck pain Assessment & Plan: Continues to work with pain management / specialist    Chronic left shoulder pain Assessment & Plan: Continues to work with pain management / specialist    Other orders -     Amphetamine-Dextroamphet ER; Take 1 capsule (20 mg total) by mouth daily.  Dispense: 30 capsule; Refill: 0        Return if symptoms worsen or fail to improve.    Gehrig Patras M Kaidan Spengler, PA-C

## 2022-12-23 ENCOUNTER — Other Ambulatory Visit (HOSPITAL_COMMUNITY): Payer: Self-pay | Admitting: Physician Assistant

## 2022-12-23 DIAGNOSIS — D329 Benign neoplasm of meninges, unspecified: Secondary | ICD-10-CM

## 2022-12-26 NOTE — Assessment & Plan Note (Signed)
Pt is following with Dr. Adele Schilder Starting on Strattera 25 mg Continue Adderall XR at a reduced dose of 20 mg, refilled this today for him He will check with [facility] about neurocognitive testing  PDMP reviewed today, no red flags, filling appropriately.

## 2022-12-26 NOTE — Assessment & Plan Note (Signed)
Continues to work with pain management / specialist

## 2022-12-28 ENCOUNTER — Ambulatory Visit
Admission: RE | Admit: 2022-12-28 | Discharge: 2022-12-28 | Disposition: A | Discharge status: Home / Self Care | Payer: Commercial Managed Care - PPO | Source: Ambulatory Visit | Attending: Anesthesiology | Admitting: Anesthesiology

## 2022-12-28 ENCOUNTER — Other Ambulatory Visit: Payer: Self-pay | Admitting: Anesthesiology

## 2022-12-28 DIAGNOSIS — M47818 Spondylosis without myelopathy or radiculopathy, sacral and sacrococcygeal region: Secondary | ICD-10-CM | POA: Diagnosis not present

## 2022-12-28 DIAGNOSIS — M62830 Muscle spasm of back: Secondary | ICD-10-CM | POA: Diagnosis not present

## 2022-12-28 DIAGNOSIS — M5136 Other intervertebral disc degeneration, lumbar region: Secondary | ICD-10-CM | POA: Diagnosis not present

## 2022-12-28 DIAGNOSIS — Z96612 Presence of left artificial shoulder joint: Secondary | ICD-10-CM | POA: Diagnosis not present

## 2022-12-28 DIAGNOSIS — M25512 Pain in left shoulder: Secondary | ICD-10-CM | POA: Diagnosis not present

## 2022-12-28 DIAGNOSIS — M546 Pain in thoracic spine: Secondary | ICD-10-CM | POA: Diagnosis not present

## 2022-12-28 DIAGNOSIS — Z471 Aftercare following joint replacement surgery: Secondary | ICD-10-CM | POA: Diagnosis not present

## 2022-12-28 DIAGNOSIS — M5412 Radiculopathy, cervical region: Secondary | ICD-10-CM | POA: Diagnosis not present

## 2022-12-28 DIAGNOSIS — R531 Weakness: Secondary | ICD-10-CM | POA: Diagnosis not present

## 2022-12-28 DIAGNOSIS — M7651 Patellar tendinitis, right knee: Secondary | ICD-10-CM | POA: Diagnosis not present

## 2022-12-29 ENCOUNTER — Other Ambulatory Visit: Payer: Self-pay | Admitting: Anesthesiology

## 2022-12-29 DIAGNOSIS — M5412 Radiculopathy, cervical region: Secondary | ICD-10-CM

## 2022-12-29 DIAGNOSIS — M62830 Muscle spasm of back: Secondary | ICD-10-CM

## 2022-12-29 DIAGNOSIS — M47818 Spondylosis without myelopathy or radiculopathy, sacral and sacrococcygeal region: Secondary | ICD-10-CM

## 2022-12-29 DIAGNOSIS — R531 Weakness: Secondary | ICD-10-CM

## 2022-12-29 DIAGNOSIS — M542 Cervicalgia: Secondary | ICD-10-CM

## 2022-12-29 DIAGNOSIS — M25512 Pain in left shoulder: Secondary | ICD-10-CM

## 2023-01-01 ENCOUNTER — Other Ambulatory Visit: Payer: Self-pay

## 2023-01-01 ENCOUNTER — Other Ambulatory Visit (HOSPITAL_BASED_OUTPATIENT_CLINIC_OR_DEPARTMENT_OTHER): Payer: Self-pay

## 2023-01-02 ENCOUNTER — Other Ambulatory Visit (HOSPITAL_BASED_OUTPATIENT_CLINIC_OR_DEPARTMENT_OTHER): Payer: Self-pay

## 2023-01-03 ENCOUNTER — Telehealth: Payer: Self-pay | Admitting: Physician Assistant

## 2023-01-03 DIAGNOSIS — M546 Pain in thoracic spine: Secondary | ICD-10-CM | POA: Diagnosis not present

## 2023-01-03 DIAGNOSIS — M7651 Patellar tendinitis, right knee: Secondary | ICD-10-CM | POA: Diagnosis not present

## 2023-01-03 DIAGNOSIS — M5136 Other intervertebral disc degeneration, lumbar region: Secondary | ICD-10-CM | POA: Diagnosis not present

## 2023-01-03 DIAGNOSIS — R531 Weakness: Secondary | ICD-10-CM | POA: Diagnosis not present

## 2023-01-03 NOTE — Telephone Encounter (Signed)
Copied from [place] [id number]. Topic: Medicare AWV >> Jan 03, 2023 10:07 AM Gillis Santa wrote: Reason for CRM: LVM FOR PATIENT TO CALL KAREN [phone] TO SCHEDULE AWV [place]

## 2023-01-04 ENCOUNTER — Other Ambulatory Visit: Payer: Self-pay

## 2023-01-04 ENCOUNTER — Other Ambulatory Visit (HOSPITAL_BASED_OUTPATIENT_CLINIC_OR_DEPARTMENT_OTHER): Payer: Self-pay

## 2023-01-04 DIAGNOSIS — Z79891 Long term (current) use of opiate analgesic: Secondary | ICD-10-CM | POA: Diagnosis not present

## 2023-01-04 DIAGNOSIS — M62830 Muscle spasm of back: Secondary | ICD-10-CM | POA: Diagnosis not present

## 2023-01-04 DIAGNOSIS — M47818 Spondylosis without myelopathy or radiculopathy, sacral and sacrococcygeal region: Secondary | ICD-10-CM | POA: Diagnosis not present

## 2023-01-04 DIAGNOSIS — G894 Chronic pain syndrome: Secondary | ICD-10-CM | POA: Diagnosis not present

## 2023-01-05 ENCOUNTER — Ambulatory Visit: Payer: Medicare Other | Admitting: Physician Assistant

## 2023-01-10 ENCOUNTER — Ambulatory Visit (HOSPITAL_COMMUNITY)
Admission: RE | Admit: 2023-01-10 | Discharge: 2023-01-10 | Disposition: A | Discharge status: Home / Self Care | Payer: Commercial Managed Care - PPO | Source: Ambulatory Visit | Attending: Physician Assistant | Admitting: Physician Assistant

## 2023-01-10 DIAGNOSIS — Z9889 Other specified postprocedural states: Secondary | ICD-10-CM | POA: Diagnosis not present

## 2023-01-10 DIAGNOSIS — D329 Benign neoplasm of meninges, unspecified: Secondary | ICD-10-CM

## 2023-01-10 DIAGNOSIS — G9389 Other specified disorders of brain: Secondary | ICD-10-CM | POA: Diagnosis not present

## 2023-01-10 MED ORDER — GADOBUTROL 1 MMOL/ML IV SOLN
8.0000 mL | Freq: Once | INTRAVENOUS | Status: AC | PRN
  Administered 2023-01-10: 8 mL via INTRAVENOUS

## 2023-01-11 ENCOUNTER — Other Ambulatory Visit (HOSPITAL_COMMUNITY): Payer: Self-pay | Admitting: Psychiatry

## 2023-01-11 ENCOUNTER — Other Ambulatory Visit: Payer: Self-pay

## 2023-01-11 ENCOUNTER — Other Ambulatory Visit (HOSPITAL_COMMUNITY): Payer: Commercial Managed Care - PPO

## 2023-01-11 ENCOUNTER — Other Ambulatory Visit (HOSPITAL_COMMUNITY): Payer: Self-pay | Admitting: Anesthesiology

## 2023-01-11 ENCOUNTER — Encounter (HOSPITAL_COMMUNITY): Payer: Self-pay | Admitting: Anesthesiology

## 2023-01-11 DIAGNOSIS — F902 Attention-deficit hyperactivity disorder, combined type: Secondary | ICD-10-CM

## 2023-01-11 DIAGNOSIS — R531 Weakness: Secondary | ICD-10-CM

## 2023-01-11 DIAGNOSIS — M5412 Radiculopathy, cervical region: Secondary | ICD-10-CM

## 2023-01-11 DIAGNOSIS — M542 Cervicalgia: Secondary | ICD-10-CM

## 2023-01-11 DIAGNOSIS — M25512 Pain in left shoulder: Secondary | ICD-10-CM

## 2023-01-11 DIAGNOSIS — M5136 Other intervertebral disc degeneration, lumbar region: Secondary | ICD-10-CM | POA: Diagnosis not present

## 2023-01-11 DIAGNOSIS — M546 Pain in thoracic spine: Secondary | ICD-10-CM | POA: Diagnosis not present

## 2023-01-11 DIAGNOSIS — L570 Actinic keratosis: Secondary | ICD-10-CM | POA: Diagnosis not present

## 2023-01-11 DIAGNOSIS — D485 Neoplasm of uncertain behavior of skin: Secondary | ICD-10-CM | POA: Diagnosis not present

## 2023-01-11 DIAGNOSIS — M47818 Spondylosis without myelopathy or radiculopathy, sacral and sacrococcygeal region: Secondary | ICD-10-CM

## 2023-01-11 DIAGNOSIS — M62830 Muscle spasm of back: Secondary | ICD-10-CM

## 2023-01-11 DIAGNOSIS — M7651 Patellar tendinitis, right knee: Secondary | ICD-10-CM | POA: Diagnosis not present

## 2023-01-12 ENCOUNTER — Encounter (HOSPITAL_COMMUNITY): Payer: Self-pay | Admitting: Psychiatry

## 2023-01-12 ENCOUNTER — Other Ambulatory Visit (HOSPITAL_BASED_OUTPATIENT_CLINIC_OR_DEPARTMENT_OTHER): Payer: Self-pay

## 2023-01-12 ENCOUNTER — Ambulatory Visit (HOSPITAL_BASED_OUTPATIENT_CLINIC_OR_DEPARTMENT_OTHER): Payer: Commercial Managed Care - PPO | Admitting: Psychiatry

## 2023-01-12 DIAGNOSIS — F902 Attention-deficit hyperactivity disorder, combined type: Secondary | ICD-10-CM

## 2023-01-12 MED ORDER — AMPHETAMINE-DEXTROAMPHET ER 20 MG PO CP24
40.0000 mg | ORAL_CAPSULE | Freq: Every day | ORAL | 0 refills | Status: DC
  Filled 2023-01-12 – 2023-01-14 (×5): qty 60, 30d supply, fill #0
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
## 2023-01-13 ENCOUNTER — Encounter (HOSPITAL_BASED_OUTPATIENT_CLINIC_OR_DEPARTMENT_OTHER): Payer: Self-pay | Admitting: Pharmacist

## 2023-01-13 ENCOUNTER — Other Ambulatory Visit (HOSPITAL_BASED_OUTPATIENT_CLINIC_OR_DEPARTMENT_OTHER): Payer: Self-pay

## 2023-01-14 ENCOUNTER — Encounter (HOSPITAL_BASED_OUTPATIENT_CLINIC_OR_DEPARTMENT_OTHER): Payer: Self-pay

## 2023-01-14 ENCOUNTER — Other Ambulatory Visit (HOSPITAL_BASED_OUTPATIENT_CLINIC_OR_DEPARTMENT_OTHER): Payer: Self-pay

## 2023-01-17 DIAGNOSIS — M546 Pain in thoracic spine: Secondary | ICD-10-CM | POA: Diagnosis not present

## 2023-01-17 DIAGNOSIS — R531 Weakness: Secondary | ICD-10-CM | POA: Diagnosis not present

## 2023-01-17 DIAGNOSIS — M5136 Other intervertebral disc degeneration, lumbar region: Secondary | ICD-10-CM | POA: Diagnosis not present

## 2023-01-17 DIAGNOSIS — M7651 Patellar tendinitis, right knee: Secondary | ICD-10-CM | POA: Diagnosis not present

## 2023-01-18 ENCOUNTER — Ambulatory Visit (HOSPITAL_COMMUNITY)
Admission: RE | Admit: 2023-01-18 | Discharge: 2023-01-18 | Disposition: A | Discharge status: Home / Self Care | Payer: Commercial Managed Care - PPO | Source: Ambulatory Visit | Attending: Anesthesiology | Admitting: Anesthesiology

## 2023-01-18 DIAGNOSIS — M25512 Pain in left shoulder: Secondary | ICD-10-CM | POA: Diagnosis not present

## 2023-01-18 DIAGNOSIS — M542 Cervicalgia: Secondary | ICD-10-CM

## 2023-01-18 DIAGNOSIS — M62830 Muscle spasm of back: Secondary | ICD-10-CM | POA: Diagnosis not present

## 2023-01-18 DIAGNOSIS — M5412 Radiculopathy, cervical region: Secondary | ICD-10-CM | POA: Insufficient documentation

## 2023-01-18 DIAGNOSIS — R531 Weakness: Secondary | ICD-10-CM | POA: Insufficient documentation

## 2023-01-18 MED ORDER — GADOBUTROL 1 MMOL/ML IV SOLN
8.0000 mL | Freq: Once | INTRAVENOUS | Status: AC | PRN
  Administered 2023-01-18: 8 mL via INTRAVENOUS

## 2023-01-19 ENCOUNTER — Telehealth: Payer: Self-pay | Admitting: Physician Assistant

## 2023-01-19 NOTE — Telephone Encounter (Signed)
Copied from [place] [id number]. Topic: Medicare AWV >> Jan 19, 2023 10:40 AM Gillis Santa wrote: Reason for CRM:   Called patient to schedule Medicare Annual Wellness Visit (AWV). Left message for patient to call back and schedule Medicare Annual Wellness Visit (AWV).  Last date of AWV: 01/27/2017  Please schedule an appointment at any time with Otila Kluver, [facility].  If any questions, please contact me at [phone].  Thank you ,  Shaune Pollack [facility] AWV TEAM Direct Dial [phone]

## 2023-01-23 DIAGNOSIS — M47818 Spondylosis without myelopathy or radiculopathy, sacral and sacrococcygeal region: Secondary | ICD-10-CM | POA: Diagnosis not present

## 2023-01-23 DIAGNOSIS — G894 Chronic pain syndrome: Secondary | ICD-10-CM | POA: Diagnosis not present

## 2023-01-23 DIAGNOSIS — M4712 Other spondylosis with myelopathy, cervical region: Secondary | ICD-10-CM | POA: Diagnosis not present

## 2023-01-23 DIAGNOSIS — Z79891 Long term (current) use of opiate analgesic: Secondary | ICD-10-CM | POA: Diagnosis not present

## 2023-01-23 DIAGNOSIS — M62830 Muscle spasm of back: Secondary | ICD-10-CM | POA: Diagnosis not present

## 2023-01-23 DIAGNOSIS — M4802 Spinal stenosis, cervical region: Secondary | ICD-10-CM | POA: Diagnosis not present

## 2023-01-25 ENCOUNTER — Telehealth: Payer: Self-pay | Admitting: Physician Assistant

## 2023-01-25 DIAGNOSIS — M7651 Patellar tendinitis, right knee: Secondary | ICD-10-CM | POA: Diagnosis not present

## 2023-01-25 DIAGNOSIS — M5136 Other intervertebral disc degeneration, lumbar region: Secondary | ICD-10-CM | POA: Diagnosis not present

## 2023-01-25 DIAGNOSIS — R531 Weakness: Secondary | ICD-10-CM | POA: Diagnosis not present

## 2023-01-25 DIAGNOSIS — M546 Pain in thoracic spine: Secondary | ICD-10-CM | POA: Diagnosis not present

## 2023-01-25 NOTE — Telephone Encounter (Signed)
Copied from [place] [phone] [id number]. Topic: Medicare AWV >> Jan 25, 2023 11:13 AM Gillis Santa wrote: Reason for CRM: Called patient to schedule Medicare Annual Wellness Visit (AWV). Left message for patient to call back and schedule Medicare Annual Wellness Visit (AWV).  Last date of AWV: n/a  Please schedule an appointment at any time with Otila Kluver, [facility].  If any questions, please contact me at [phone].  Thank you ,  Shaune Pollack [facility] AWV TEAM Direct Dial [phone]

## 2023-02-01 ENCOUNTER — Other Ambulatory Visit (HOSPITAL_BASED_OUTPATIENT_CLINIC_OR_DEPARTMENT_OTHER): Payer: Self-pay

## 2023-02-01 DIAGNOSIS — M62830 Muscle spasm of back: Secondary | ICD-10-CM | POA: Diagnosis not present

## 2023-02-01 DIAGNOSIS — M4802 Spinal stenosis, cervical region: Secondary | ICD-10-CM | POA: Diagnosis not present

## 2023-02-01 DIAGNOSIS — M47818 Spondylosis without myelopathy or radiculopathy, sacral and sacrococcygeal region: Secondary | ICD-10-CM | POA: Diagnosis not present

## 2023-02-01 DIAGNOSIS — M4712 Other spondylosis with myelopathy, cervical region: Secondary | ICD-10-CM | POA: Diagnosis not present

## 2023-02-01 MED ORDER — BUTALBITAL-APAP-CAFFEINE 50-325-40 MG PO TABS
1.0000 | ORAL_TABLET | Freq: Four times a day (QID) | ORAL | 1 refills | Status: DC | PRN
  Filled 2023-02-01: qty 120, 30d supply, fill #0
  Filled 2023-02-28: qty 120, 30d supply, fill #1

## 2023-02-07 ENCOUNTER — Other Ambulatory Visit: Payer: Self-pay

## 2023-02-07 ENCOUNTER — Other Ambulatory Visit (HOSPITAL_COMMUNITY): Payer: Self-pay | Admitting: Psychiatry

## 2023-02-07 ENCOUNTER — Other Ambulatory Visit (HOSPITAL_BASED_OUTPATIENT_CLINIC_OR_DEPARTMENT_OTHER): Payer: Self-pay

## 2023-02-07 DIAGNOSIS — F902 Attention-deficit hyperactivity disorder, combined type: Secondary | ICD-10-CM

## 2023-02-09 ENCOUNTER — Telehealth (HOSPITAL_COMMUNITY): Payer: Self-pay | Admitting: *Deleted

## 2023-02-09 DIAGNOSIS — F902 Attention-deficit hyperactivity disorder, combined type: Secondary | ICD-10-CM

## 2023-02-09 NOTE — Telephone Encounter (Signed)
Pt called requesting a refill of the Adderall XR 20 mg (total dose 40 mg). Last e-scribed 01/12/23. Pt has an upcoming appointment 02/16/23. Please review.

## 2023-02-10 ENCOUNTER — Other Ambulatory Visit: Payer: Self-pay | Admitting: Physician Assistant

## 2023-02-10 ENCOUNTER — Other Ambulatory Visit (HOSPITAL_BASED_OUTPATIENT_CLINIC_OR_DEPARTMENT_OTHER): Payer: Self-pay

## 2023-02-10 ENCOUNTER — Encounter: Payer: Self-pay | Admitting: Physician Assistant

## 2023-02-10 DIAGNOSIS — M542 Cervicalgia: Secondary | ICD-10-CM | POA: Diagnosis not present

## 2023-02-10 DIAGNOSIS — Z6826 Body mass index (BMI) 26.0-26.9, adult: Secondary | ICD-10-CM | POA: Diagnosis not present

## 2023-02-10 DIAGNOSIS — M4722 Other spondylosis with radiculopathy, cervical region: Secondary | ICD-10-CM | POA: Diagnosis not present

## 2023-02-10 MED ORDER — AMPHETAMINE-DEXTROAMPHET ER 20 MG PO CP24
40.0000 mg | ORAL_CAPSULE | Freq: Every day | ORAL | 0 refills | Status: DC
  Filled 2023-02-10 – 2023-02-11 (×2): qty 60, 30d supply, fill #0

## 2023-02-10 MED ORDER — METHYLPREDNISOLONE 4 MG PO TBPK
ORAL_TABLET | ORAL | 0 refills | Status: DC
  Filled 2023-02-10: qty 21, 6d supply, fill #0

## 2023-02-10 NOTE — Telephone Encounter (Signed)
Send to [employer] [facility]. He needs to follow up with his PCP for high blood pressure.

## 2023-02-10 NOTE — Telephone Encounter (Signed)
Please see pt msg and advise 

## 2023-02-11 ENCOUNTER — Other Ambulatory Visit (HOSPITAL_BASED_OUTPATIENT_CLINIC_OR_DEPARTMENT_OTHER): Payer: Self-pay

## 2023-02-13 ENCOUNTER — Other Ambulatory Visit (HOSPITAL_BASED_OUTPATIENT_CLINIC_OR_DEPARTMENT_OTHER): Payer: Self-pay

## 2023-02-13 MED ORDER — MELOXICAM 15 MG PO TABS
15.0000 mg | ORAL_TABLET | Freq: Every day | ORAL | 2 refills | Status: DC | PRN
  Filled 2023-02-13: qty 30, 30d supply, fill #0
  Filled 2023-04-24: qty 30, 30d supply, fill #1
  Filled 2023-09-03: qty 30, 30d supply, fill #2

## 2023-02-14 DIAGNOSIS — D329 Benign neoplasm of meninges, unspecified: Secondary | ICD-10-CM | POA: Diagnosis not present

## 2023-02-14 DIAGNOSIS — M4802 Spinal stenosis, cervical region: Secondary | ICD-10-CM | POA: Diagnosis not present

## 2023-02-16 ENCOUNTER — Other Ambulatory Visit (HOSPITAL_BASED_OUTPATIENT_CLINIC_OR_DEPARTMENT_OTHER): Payer: Self-pay

## 2023-02-16 ENCOUNTER — Ambulatory Visit (HOSPITAL_BASED_OUTPATIENT_CLINIC_OR_DEPARTMENT_OTHER): Payer: Commercial Managed Care - PPO | Admitting: Psychiatry

## 2023-02-16 ENCOUNTER — Encounter (HOSPITAL_COMMUNITY): Payer: Self-pay | Admitting: Psychiatry

## 2023-02-16 DIAGNOSIS — F902 Attention-deficit hyperactivity disorder, combined type: Secondary | ICD-10-CM | POA: Diagnosis not present

## 2023-02-16 MED ORDER — AMPHETAMINE-DEXTROAMPHET ER 20 MG PO CP24
40.0000 mg | ORAL_CAPSULE | Freq: Every day | ORAL | 0 refills | Status: DC
  Filled 2023-02-16 – 2023-03-13 (×2): qty 60, 30d supply, fill #0

## 2023-02-16 NOTE — Progress Notes (Addendum)
[place] MD/PA/NP OP Progress Note   Patient location; office Provider location; office  02/16/2023 4:37 PM Jeremiah Anderson  MRN:  [id number]  Chief Complaint:  Chief Complaint  Patient presents with   Follow-up   HPI: Patient came to the office for his follow-up appointment.  He is taking Adderall 20 mg 2 times a day.  He admitted is helping his focus, attention and is able to manage his job.  He admitted some concern because his mother but Adderall also giving him some confidence.  He recently saw his doctor at [facility] for a follow-up after the brain surgery but no new medication added.  Today he has a mild elevation of blood pressure but better reading than before.  He sleeps okay.  Sometimes he is very worried about his job because he wants to continue work but usually his fidgetiness, restlessness does make him very anxious.  So far he is getting along with the coworker.  He has no tremors, shakes or any EPS.  He has neck pain and sometimes he has sleeping issues but denies any anger, mania, suicidal thoughts or homicidal thoughts.  His appetite is okay.  His weight is unchanged from the past.  He denies drinking or using any illegal substances.  Visit Diagnosis:    ICD-10-CM   1. Attention deficit hyperactivity disorder (ADHD), combined type  F90.2 amphetamine-dextroamphetamine (ADDERALL XR) 20 MG 24 hr capsule      Past Psychiatric History: Reviewed. History of ADHD diagnosed in 2005.  Took Adderall until 2013 stop the medication because he was also taking a lot of pain medication.  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  No history of suicidal attempt but reported history of suicidal thoughts when he was going through withdrawals.  Denies any history of mania.  Had tried Adderall for a while and then recently Vyvanse but had GI side effects.  He tried Strattera did not help.  Primary care physician tried trazodone for sleep after he was given steroids  status post brain surgery when he could not sleep.  History of alcohol on and off and last use of beer more than a year ago.  No history of cocaine or any IV drugs.   Past Medical History:  Past Medical History:  Diagnosis Date   Allergy    Arthritis    Blood transfusion without reported diagnosis    around age 6 after major nausea secondary to prescribed MS Contin   GERD (gastroesophageal reflux disease)    Hyperlipidemia    Hypertension    Migraines    Wears glasses     Past Surgical History:  Procedure Laterality Date   APPENDECTOMY  1980s   post op bleeding sent him back for ex lap and hemostatic intervention.    BACK SURGERY     8 surgeries total - fused from L1-S1   COLONOSCOPY     Age 85. For bleeding   ESOPHAGOGASTRODUODENOSCOPY N/A 06/10/2015   Procedure: ESOPHAGOGASTRODUODENOSCOPY (EGD);  Surgeon: Lafayette Dragon, MD;  Location: [facility] ENDOSCOPY;  Service: Endoscopy;  Laterality: N/A;   ex lap with peri gastric vessel ligation  1990s   after retching, he rupture a blood vessel "outside" the stomach.  vessel was ligated per pt's descriiption.    INCISIONAL HERNIA REPAIR Right 10/21/2016   Procedure: OPEN FLANK HERNIA REPAIR WITH MESH;  Surgeon: Ralene Ok, MD;  Location: [facility];  Service: General;  Laterality: Right;   INSERTION OF  MESH Right 10/21/2016   Procedure: INSERTION OF MESH;  Surgeon: Ralene Ok, MD;  Location: [place];  Service: General;  Laterality: Right;   KNEE SURGERY     Multiple bilateral scopes   RETINAL DETACHMENT SURGERY Right    SHOULDER SURGERY     4x on the left - total shoulder replacement.    SHOULDER SURGERY Left    x2   TOTAL SHOULDER ARTHROPLASTY Left    x2    Family Psychiatric History: Reviewed.  Family History:  Family History  Problem Relation Age of Onset   Arthritis Mother    Hyperlipidemia Mother    Hypertension Father    Cancer Father    Skin cancer Father    Colon cancer Maternal Grandmother    Cancer Maternal  Grandmother    Hypertension Sister    Hypertension Paternal Grandmother    Heart attack Paternal Grandfather    Heart disease Paternal Grandfather    Diabetes Neg Hx    Stroke Neg Hx    Prostate cancer Neg Hx     Social History:  Social History   Socioeconomic History   Marital status: Single    Spouse name: Not on file   Number of children: 0   Years of education: 16   Highest education level: Bachelor's degree (e.g., BA, AB, BS)  Occupational History   Occupation: Disability    Comment: Former Therapist, sports  Tobacco Use   Smoking status: Former    Years: 24    Types: Cigarettes   Smokeless tobacco: Never  Scientific laboratory technician Use: Never used  Substance and Sexual Activity   Alcohol use: Not Currently    Alcohol/week: 0.0 standard drinks of alcohol   Drug use: No   Sexual activity: Not on file  Other Topics Concern   Not on file  Social History Narrative   Fun: play guitar / limited funds      Denies any religious beliefs effecting health care.    Social Determinants of Health   Financial Resource Strain: Not on file  Food Insecurity: Not on file  Transportation Needs: Not on file  Physical Activity: Not on file  Stress: Not on file  Social Connections: Not on file    Allergies:  Allergies  Allergen Reactions   Fentanyl Other (See Comments)    Hallucinations (with the patch)  Other Reaction(s): Other (See Comments)  Hears voice   Fentanyl Patch   Lisinopril Swelling   Lyrica [Pregabalin]     hallucinations   Sulfa Antibiotics Swelling    Metabolic Disorder Labs: No results found for: "HGBA1C", "MPG" No results found for: "PROLACTIN" Lab Results  Component Value Date   CHOL 150 04/11/2022   TRIG 107.0 04/11/2022   HDL 49.30 04/11/2022   CHOLHDL 3 04/11/2022   VLDL 21.4 04/11/2022   LDLCALC 80 04/11/2022   LDLCALC 79 11/04/2021   Lab Results  Component Value Date   TSH 0.93 11/04/2021   TSH 0.73 11/04/2020    Therapeutic Level Labs: No results  found for: "LITHIUM" No results found for: "VALPROATE" No results found for: "CBMZ"  Current Medications: Current Outpatient Medications  Medication Sig Dispense Refill   amLODipine (NORVASC) 10 MG tablet Take 1 tablet (10 mg total) by mouth daily. 90 tablet 3   amLODipine (NORVASC) 10 MG tablet Take 1 tablet (10 mg total) by mouth daily. 90 tablet 1   amphetamine-dextroamphetamine (ADDERALL XR) 20 MG 24 hr capsule Take 2 capsules (40 mg total) by  mouth daily. 60 capsule 0   atomoxetine (STRATTERA) 25 MG capsule Take 1 capsule (25 mg total) by mouth 2 (two) times daily. (Patient not taking: Reported on 01/12/2023) 30 capsule 0   butalbital-acetaminophen-caffeine (FIORICET) 50-325-40 MG tablet Take 1 tablet by mouth every 6 (six) hours as needed. 120 tablet 1   Cholecalciferol (VITAMIN D3) 25 MCG (1000 UT) CAPS Take by mouth daily.     Coenzyme Q10 (COQ10) 100 MG CAPS Take 100 mg by mouth daily.     cyanocobalamin 100 MCG tablet Take 100 mcg by mouth daily.     fluticasone (FLONASE) 50 MCG/ACT nasal spray Place 2 sprays into both nostrils daily. 16 g 11   Lidocaine 4 % PTCH Place 1-2 patches onto the skin 2 (two) times daily as needed (for pain.). Salonpas Maximum Strength Pain Relieving Gel-Patch     meloxicam (MOBIC) 15 MG tablet Take 1 tablet (15 mg total) by mouth daily as needed. 30 tablet 2   meloxicam (MOBIC) 15 MG tablet Take 1 tablet (15 mg total) by mouth daily as needed. 30 tablet 2   methylPREDNISolone (MEDROL DOSEPAK) 4 MG TBPK tablet Take as directed on package. 21 tablet 0   morphine (MSIR) 30 MG tablet Take 30 mg by mouth every 4 (four) hours as needed.     Multiple Vitamin (MULTIVITAMIN WITH MINERALS) TABS tablet Take 1 tablet by mouth daily.     Omega-3 Fatty Acids (OMEGA 3 PO) Take 120 mg by mouth daily.     ondansetron (ZOFRAN) 4 MG tablet TAKE 1 TABLET BY MOUTH EVERY 8 HOURS AS NEEDED FOR NAUSEA AND VOMITING 30 tablet 3   rosuvastatin (CRESTOR) 20 MG tablet Take 1 tablet  (20 mg total) by mouth daily. 90 tablet 3   SUMAtriptan (IMITREX) 100 MG tablet MAY REPEAT IN 2 HOURS IF HEADACHE PERSISTS OR RECURS. 9 tablet 3   tazarotene (TAZORAC) 0.1 % gel APPLY A SMALL AMOUNT TO AFFECTED AREA AT BEDTIME     testosterone cypionate (DEPOTESTOSTERONE CYPIONATE) 200 MG/ML injection      No current facility-administered medications for this visit.     Musculoskeletal: Strength & Muscle Tone: within normal limits Gait & Station: normal Patient leans: N/A  Psychiatric Specialty Exam: Review of Systems  Musculoskeletal:  Positive for neck pain.    Blood pressure (!) 147/79, pulse (!) 101, resp. rate 18, height 5\' 10"  (1.778 m), weight 176 lb 3.2 oz (79.9 kg), SpO2 98 %.Body mass index is 25.28 kg/m.  General Appearance: Casual  Eye Contact:  Good  Speech:   fast  Volume:  Normal  Mood:  Anxious  Affect:  Congruent  Thought Process:  Descriptions of Associations: Intact  Orientation:  Full (Time, Place, and Person)  Thought Content: Rumination   Suicidal Thoughts:  No  Homicidal Thoughts:  No  Memory:  Immediate;   Fair Recent;   Good Remote;   Good  Judgement:  Intact  Insight:  Present  Psychomotor Activity:  Restlessness and fidgety  Concentration:  Concentration: Fair and Attention Span: Fair  Recall:  Good  Fund of Knowledge: Good  Language: Good  Akathisia:  No  Handed:  Right  AIMS (if indicated): not done  Assets:  Communication Skills Desire for Improvement Housing Talents/Skills Transportation  ADL's:  Intact  Cognition: WNL  Sleep:   ok   Screenings: GAD-7    Flowsheet Row Office Visit from 12/22/2022 in [place]  Total GAD-7 Score 2  [place] Office Visit from 12/22/2022 in [place] Visit from 12/15/2022 in [facility] Office Visit from 04/11/2022 in [place] Visit from 12/13/2017 in  [place]  PHQ-2 Total Score 1 1 0 0  PHQ-9 Total Score 3 5 -- --      [place] Office Visit from 12/15/2022 in [place] Error: Question 6 not populated        Assessment and Plan: ADHD, combined type  Patient taking Adderall XR 20 mg 2 times a day.  He has mild elevation of blood pressure but he is going to discuss with his primary care to address his blood pressure because he does not want to cut down the Adderall since it is helping his focus, energy, motivation, multitasking and is able to keep his job.  He has no palpitation or any mania.  He has no tremors.  He is still waiting for neuropsych testing as he is still not sure if some of the symptoms he has is due to post brain surgery.  He had requested to his neurologist but have not heard from him.  Discussed medication side effects and benefits.  I discussed if blood pressure remains elevated then we may need to consider lowering the dose of Adderall.  He agreed with the plan however for now we will keep the current medication since he is going to talk to his PCP.  Recommend to call us back if is any question or concern.  Follow-up in 2 months.  Collaboration of Care: Collaboration of Care: Other provider involved in patient's care AEB notes are available in epic to review.  Patient/Guardian was advised Release of Information must be obtained prior to any record release in order to collaborate their care with an outside provider. Patient/Guardian was advised if they have not already done so to contact the registration department to sign all necessary forms in order for [place] to release information regarding their care.   Consent: Patient/Guardian gives verbal consent for treatment and assignment of benefits for services provided during this visit. Patient/Guardian expressed understanding and agreed to proceed.    Kathlee Nations, MD 02/16/2023,  4:37 PM

## 2023-02-23 DIAGNOSIS — M4312 Spondylolisthesis, cervical region: Secondary | ICD-10-CM | POA: Diagnosis not present

## 2023-02-23 DIAGNOSIS — M50322 Other cervical disc degeneration at C5-C6 level: Secondary | ICD-10-CM | POA: Diagnosis not present

## 2023-02-23 DIAGNOSIS — M542 Cervicalgia: Secondary | ICD-10-CM | POA: Diagnosis not present

## 2023-02-23 DIAGNOSIS — M4802 Spinal stenosis, cervical region: Secondary | ICD-10-CM | POA: Diagnosis not present

## 2023-02-23 DIAGNOSIS — M47819 Spondylosis without myelopathy or radiculopathy, site unspecified: Secondary | ICD-10-CM | POA: Diagnosis not present

## 2023-02-24 ENCOUNTER — Other Ambulatory Visit: Payer: Self-pay | Admitting: Physician Assistant

## 2023-02-27 NOTE — Telephone Encounter (Signed)
Last OV: 12/22/22  Next OV: None scheduled  Last filled: 08/29/22  Quantity: 30 w/ 3 refills

## 2023-02-28 ENCOUNTER — Encounter: Payer: Self-pay | Admitting: Physician Assistant

## 2023-02-28 ENCOUNTER — Other Ambulatory Visit: Payer: Self-pay | Admitting: Physician Assistant

## 2023-03-01 ENCOUNTER — Other Ambulatory Visit (HOSPITAL_BASED_OUTPATIENT_CLINIC_OR_DEPARTMENT_OTHER): Payer: Self-pay

## 2023-03-01 DIAGNOSIS — M4802 Spinal stenosis, cervical region: Secondary | ICD-10-CM | POA: Diagnosis not present

## 2023-03-01 DIAGNOSIS — M4712 Other spondylosis with myelopathy, cervical region: Secondary | ICD-10-CM | POA: Diagnosis not present

## 2023-03-01 DIAGNOSIS — M47818 Spondylosis without myelopathy or radiculopathy, sacral and sacrococcygeal region: Secondary | ICD-10-CM | POA: Diagnosis not present

## 2023-03-01 DIAGNOSIS — M62830 Muscle spasm of back: Secondary | ICD-10-CM | POA: Diagnosis not present

## 2023-03-01 MED ORDER — AMLODIPINE BESYLATE 10 MG PO TABS
10.0000 mg | ORAL_TABLET | Freq: Every day | ORAL | 1 refills | Status: DC
  Filled 2023-03-01: qty 90, 90d supply, fill #0

## 2023-03-09 ENCOUNTER — Ambulatory Visit: Payer: Commercial Managed Care - PPO | Admitting: Physician Assistant

## 2023-03-09 ENCOUNTER — Encounter: Payer: Self-pay | Admitting: Physician Assistant

## 2023-03-10 ENCOUNTER — Other Ambulatory Visit (HOSPITAL_BASED_OUTPATIENT_CLINIC_OR_DEPARTMENT_OTHER): Payer: Self-pay

## 2023-03-10 MED ORDER — AMLODIPINE BESYLATE 10 MG PO TABS
10.0000 mg | ORAL_TABLET | Freq: Every day | ORAL | 1 refills | Status: DC
  Filled 2023-03-10 – 2023-06-02 (×2): qty 90, 90d supply, fill #0
  Filled 2023-08-24: qty 90, 90d supply, fill #1

## 2023-03-10 MED ORDER — ROSUVASTATIN CALCIUM 20 MG PO TABS
20.0000 mg | ORAL_TABLET | Freq: Every day | ORAL | 1 refills | Status: DC
  Filled 2023-03-10: qty 90, 90d supply, fill #0
  Filled 2023-05-16 – 2023-06-02 (×3): qty 90, 90d supply, fill #1

## 2023-03-10 NOTE — Telephone Encounter (Signed)
Refilled to [facility]

## 2023-03-11 ENCOUNTER — Other Ambulatory Visit (HOSPITAL_BASED_OUTPATIENT_CLINIC_OR_DEPARTMENT_OTHER): Payer: Self-pay

## 2023-03-13 ENCOUNTER — Other Ambulatory Visit: Payer: Self-pay

## 2023-03-13 ENCOUNTER — Other Ambulatory Visit (HOSPITAL_BASED_OUTPATIENT_CLINIC_OR_DEPARTMENT_OTHER): Payer: Self-pay

## 2023-03-13 DIAGNOSIS — H59813 Chorioretinal scars after surgery for detachment, bilateral: Secondary | ICD-10-CM | POA: Diagnosis not present

## 2023-03-13 DIAGNOSIS — H35372 Puckering of macula, left eye: Secondary | ICD-10-CM | POA: Diagnosis not present

## 2023-03-13 DIAGNOSIS — H35033 Hypertensive retinopathy, bilateral: Secondary | ICD-10-CM | POA: Diagnosis not present

## 2023-03-13 DIAGNOSIS — H2513 Age-related nuclear cataract, bilateral: Secondary | ICD-10-CM | POA: Diagnosis not present

## 2023-03-13 DIAGNOSIS — H43813 Vitreous degeneration, bilateral: Secondary | ICD-10-CM | POA: Diagnosis not present

## 2023-03-15 ENCOUNTER — Encounter: Payer: Self-pay | Admitting: Physician Assistant

## 2023-03-15 ENCOUNTER — Ambulatory Visit (INDEPENDENT_AMBULATORY_CARE_PROVIDER_SITE_OTHER): Payer: Commercial Managed Care - PPO | Admitting: Physician Assistant

## 2023-03-15 VITALS — BP 116/70 | HR 98 | Temp 97.1°F | Ht 70.0 in | Wt 174.4 lb

## 2023-03-15 DIAGNOSIS — Z23 Encounter for immunization: Secondary | ICD-10-CM | POA: Diagnosis not present

## 2023-03-15 DIAGNOSIS — M898X6 Other specified disorders of bone, lower leg: Secondary | ICD-10-CM

## 2023-03-15 DIAGNOSIS — I10 Essential (primary) hypertension: Secondary | ICD-10-CM

## 2023-03-15 DIAGNOSIS — M4802 Spinal stenosis, cervical region: Secondary | ICD-10-CM | POA: Insufficient documentation

## 2023-03-15 NOTE — Progress Notes (Signed)
Subjective:    Patient ID: Jeremiah Anderson, male    DOB: Oct 08, 1966, 57 y.o.   MRN: [id number]  Chief Complaint  Patient presents with   Hypertension   Medical Management of Chronic Issues    Pt in the office for hypertension and to discuss neck fusion at [facility];     Patient is in today for recheck HTN. Also to update on life events.   Scheduled for surgery 03/20/23 - Anterior Cervical Discectomy and fusion C 4-5, C5-6, C6-7   with Medtr[employer] Cornerstone LS allograft and Alphatec Insignia plating with Dr. 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 Sharp pain, can last all day sometimes. No known injury, hasn't been exercising because of pain in neck.   BP 118/70 at last appt with [facility]. Running normal at home. States it was elevated at psych appointment because he was frustrated at that time.   Past Medical History:  Diagnosis Date   Allergy    Arthritis    Blood transfusion without reported diagnosis    around age 43 after major nausea secondary to prescribed MS Contin   GERD (gastroesophageal reflux disease)    Hyperlipidemia    Hypertension    Migraines    Wears glasses     Past Surgical History:  Procedure Laterality Date   APPENDECTOMY  1980s   post op bleeding sent him back for ex lap and hemostatic intervention.    BACK SURGERY     8 surgeries total - fused from L1-S1   COLONOSCOPY     Age 48. For bleeding   ESOPHAGOGASTRODUODENOSCOPY N/A 06/10/2015   Procedure: ESOPHAGOGASTRODUODENOSCOPY (EGD);  Surgeon: Hart Carwin, MD;  Location: [facility] ENDOSCOPY;  Service: Endoscopy;  Laterality: N/A;   ex lap with peri gastric vessel ligation  1990s   after retching, he rupture a blood vessel "outside" the stomach.  vessel was ligated per pt's descriiption.    INCISIONAL HERNIA REPAIR Right 10/21/2016   Procedure:  OPEN FLANK HERNIA REPAIR WITH MESH;  Surgeon: Axel Filler, MD;  Location: [facility] OR;  Service: General;  Laterality: Right;   INSERTION OF MESH Right 10/21/2016   Procedure: INSERTION OF MESH;  Surgeon: Axel Filler, MD;  Location: [facility] OR;  Service: General;  Laterality: Right;   KNEE SURGERY     Multiple bilateral scopes   RETINAL DETACHMENT SURGERY Right    SHOULDER SURGERY     4x on the left - total shoulder replacement.    SHOULDER SURGERY Left    x2   TOTAL SHOULDER ARTHROPLASTY Left    x2    Family History  Problem Relation Age of Onset   Arthritis Mother    Hyperlipidemia Mother    Hypertension Father    Cancer Father    Skin cancer Father    Colon cancer Maternal Grandmother    Cancer Maternal Grandmother    Hypertension Sister    Hypertension Paternal Grandmother    Heart attack Paternal Grandfather    Heart disease Paternal Grandfather    Diabetes Neg Hx    Stroke Neg Hx    Prostate cancer Neg Hx     Social History   Tobacco Use   Smoking status: Former    Years: 24    Types: Cigarettes   Smokeless tobacco: Never  Vaping Use   Vaping Use: Never used  Substance Use Topics   Alcohol use: Not Currently    Alcohol/week: 0.0 standard drinks of alcohol   Drug use: No     Allergies  Allergen Reactions   Fentanyl Other (See Comments)    Hallucinations (with the patch)  Other Reaction(s): Other (See Comments)  Hears voice   Fentanyl Patch   Lisinopril Swelling   Lyrica [Pregabalin]     hallucinations   Metoprolol Other (See Comments)   Sulfa Antibiotics Swelling    Review of Systems NEGATIVE UNLESS OTHERWISE INDICATED IN HPI      Objective:     BP 116/70 (BP Location: Left Arm)   Pulse 98   Temp (!) 97.1 F (36.2 C) (Temporal)   Ht  (1.778 m)   Wt 174 lb 6.4 oz (79.1 kg)   SpO2 96%   BMI 25.02 kg/m   Wt Readings from Last 3 Encounters:  03/15/23 174 lb 6.4 oz (79.1 kg)  02/16/23 176 lb 3.2 oz (79.9 kg)  01/12/23 181 lb 6.4 oz  (82.3 kg)    BP Readings from Last 3 Encounters:  03/15/23 116/70  02/16/23 (!) 147/79  01/12/23 (!) 151/100     Physical Exam Constitutional:      Appearance: Normal appearance.  Cardiovascular:     Rate and Rhythm: Normal rate and regular rhythm.  Pulmonary:     Effort: Pulmonary effort is normal.     Breath sounds: Normal breath sounds.  Musculoskeletal:     Right lower leg: No edema.     Left lower leg: No edema.       Legs:  Skin:    Findings: No lesion or rash.  Neurological:     Mental Status: He is alert.  Psychiatric:        Mood and Affect: Mood normal.        Behavior: Behavior is hyperactive.        Assessment & Plan:  Essential hypertension Assessment & Plan: Blood pressure is normal.  Normal readings at home.  Continue on amlodipine 10 mg daily.   Spinal stenosis, cervical region Assessment & Plan: Scheduled for surgery this coming Monday 03/20/23   Pain of right fibula -     DG Tibia/Fibula Right; Future  Need for prophylactic vaccination and inoculation against varicella -     Varicella-zoster vaccine IM      Return if symptoms worsen or fail to improve.   Time Spent: 35 minutes of total time was spent on the date of the encounter performing the following actions: chart review prior to seeing the patient, obtaining history, performing a medically necessary exam, counseling on the treatment plan, placing orders, and documenting in our EHR.     Cheryel Kyte M Akiel Fennell, PA-C

## 2023-03-15 NOTE — Assessment & Plan Note (Signed)
Scheduled for surgery this coming Monday 03/20/23

## 2023-03-15 NOTE — Assessment & Plan Note (Signed)
Blood pressure is normal.  Normal readings at home.  Continue on amlodipine 10 mg daily. 

## 2023-03-20 DIAGNOSIS — Z888 Allergy status to other drugs, medicaments and biological substances status: Secondary | ICD-10-CM | POA: Diagnosis not present

## 2023-03-20 DIAGNOSIS — M4802 Spinal stenosis, cervical region: Secondary | ICD-10-CM | POA: Diagnosis not present

## 2023-03-20 DIAGNOSIS — Z882 Allergy status to sulfonamides status: Secondary | ICD-10-CM | POA: Diagnosis not present

## 2023-03-20 DIAGNOSIS — M4852XA Collapsed vertebra, not elsewhere classified, cervical region, initial encounter for fracture: Secondary | ICD-10-CM | POA: Diagnosis not present

## 2023-03-20 DIAGNOSIS — Z87891 Personal history of nicotine dependence: Secondary | ICD-10-CM | POA: Diagnosis not present

## 2023-03-20 DIAGNOSIS — M50022 Cervical disc disorder at C5-C6 level with myelopathy: Secondary | ICD-10-CM | POA: Diagnosis not present

## 2023-03-20 DIAGNOSIS — M50221 Other cervical disc displacement at C4-C5 level: Secondary | ICD-10-CM | POA: Diagnosis not present

## 2023-03-20 DIAGNOSIS — Z96612 Presence of left artificial shoulder joint: Secondary | ICD-10-CM | POA: Diagnosis not present

## 2023-03-20 DIAGNOSIS — M50223 Other cervical disc displacement at C6-C7 level: Secondary | ICD-10-CM | POA: Diagnosis not present

## 2023-03-20 DIAGNOSIS — M50222 Other cervical disc displacement at C5-C6 level: Secondary | ICD-10-CM | POA: Diagnosis not present

## 2023-03-20 DIAGNOSIS — M4312 Spondylolisthesis, cervical region: Secondary | ICD-10-CM | POA: Diagnosis not present

## 2023-03-20 DIAGNOSIS — E785 Hyperlipidemia, unspecified: Secondary | ICD-10-CM | POA: Diagnosis not present

## 2023-03-20 DIAGNOSIS — M50021 Cervical disc disorder at C4-C5 level with myelopathy: Secondary | ICD-10-CM | POA: Diagnosis not present

## 2023-03-20 DIAGNOSIS — I10 Essential (primary) hypertension: Secondary | ICD-10-CM | POA: Diagnosis not present

## 2023-03-20 DIAGNOSIS — M5020 Other cervical disc displacement, unspecified cervical region: Secondary | ICD-10-CM | POA: Diagnosis not present

## 2023-03-20 DIAGNOSIS — M47812 Spondylosis without myelopathy or radiculopathy, cervical region: Secondary | ICD-10-CM | POA: Diagnosis not present

## 2023-03-20 DIAGNOSIS — Z86011 Personal history of benign neoplasm of the brain: Secondary | ICD-10-CM | POA: Diagnosis not present

## 2023-03-20 DIAGNOSIS — M50023 Cervical disc disorder at C6-C7 level with myelopathy: Secondary | ICD-10-CM | POA: Diagnosis not present

## 2023-03-21 ENCOUNTER — Encounter: Payer: Self-pay | Admitting: Physician Assistant

## 2023-03-22 ENCOUNTER — Telehealth: Payer: Self-pay

## 2023-03-22 NOTE — Transitions of Care (Post Inpatient/ED Visit) (Signed)
   03/22/2023  Name: Jeremiah Anderson MRN: [id number] DOB: 09/03/1966  Today's TOC FU Call Status: Today's TOC FU Call Status:: Unsuccessul Call (1st Attempt) Unsuccessful Call (1st Attempt) Date: 03/22/23  Attempted to reach the patient regarding the most recent Inpatient/ED visit.  Follow Up Plan: Additional outreach attempts will be made to reach the patient to complete the Transitions of Care (Post Inpatient/ED visit) call.      Antionette Fairy, RN,BSN,CCM [facility] Health/[facility] Care Management Care Management Community Coordinator Direct Phone: [phone] Toll Free: [phone] Fax: [phone]

## 2023-03-23 ENCOUNTER — Telehealth: Payer: Self-pay

## 2023-03-23 NOTE — Transitions of Care (Post Inpatient/ED Visit) (Signed)
   03/23/2023  Name: Augustin Bun MRN: [id number] DOB: Jan 22, 1966  Today's TOC FU Call Status: Today's TOC FU Call Status:: Unsuccessful Call (3rd Attempt) Unsuccessful Call (3rd Attempt) Date: 03/23/23  Attempted to reach the patient regarding the most recent Inpatient/ED visit.  Follow Up Plan: No further outreach attempts will be made at this time. We have been unable to contact the patient.     Antionette Fairy, RN,BSN,CCM [facility] Health/[facility] Care Management Care Management Community Coordinator Direct Phone: [phone] Toll Free: [phone] Fax: [phone]

## 2023-03-23 NOTE — Telephone Encounter (Signed)
Please see patient msg and response to 2nd Shingles vaccine

## 2023-03-23 NOTE — Transitions of Care (Post Inpatient/ED Visit) (Signed)
   03/23/2023  Name: Jaber Dunlow MRN: [id number] DOB: [birth year]  Today's TOC FU Call Status: Today's TOC FU Call Status:: Unsuccessful Call (2nd Attempt) Unsuccessful Call (2nd Attempt) Date: 03/23/23  Attempted to reach the patient regarding the most recent Inpatient/ED visit.  Follow Up Plan: Additional outreach attempts will be made to reach the patient to complete the Transitions of Care (Post Inpatient/ED visit) call.      Antionette Fairy, RN,BSN,CCM [facility] Care Management Care Management Community Coordinator Direct Phone: [phone] Toll Free: [phone] Fax: [phone]

## 2023-03-29 ENCOUNTER — Other Ambulatory Visit (HOSPITAL_BASED_OUTPATIENT_CLINIC_OR_DEPARTMENT_OTHER): Payer: Self-pay

## 2023-03-29 DIAGNOSIS — M4802 Spinal stenosis, cervical region: Secondary | ICD-10-CM | POA: Diagnosis not present

## 2023-03-29 DIAGNOSIS — M7701 Medial epicondylitis, right elbow: Secondary | ICD-10-CM | POA: Diagnosis not present

## 2023-03-29 DIAGNOSIS — M47818 Spondylosis without myelopathy or radiculopathy, sacral and sacrococcygeal region: Secondary | ICD-10-CM | POA: Diagnosis not present

## 2023-03-29 DIAGNOSIS — M4712 Other spondylosis with myelopathy, cervical region: Secondary | ICD-10-CM | POA: Diagnosis not present

## 2023-03-29 MED ORDER — BUTALBITAL-APAP-CAFFEINE 50-325-40 MG PO TABS
ORAL_TABLET | ORAL | 1 refills | Status: DC
  Filled 2023-03-29: qty 120, 30d supply, fill #0
  Filled 2023-04-24: qty 120, 30d supply, fill #1

## 2023-03-29 MED ORDER — BACLOFEN 5 MG PO TABS
5.0000 mg | ORAL_TABLET | Freq: Three times a day (TID) | ORAL | 1 refills | Status: AC
  Filled 2023-03-29 – 2023-04-24 (×2): qty 90, 30d supply, fill #0

## 2023-03-29 NOTE — Telephone Encounter (Signed)
Patient returned call. I wasn't able to schedule him correctly. I provided pt with your number just in case but he prefers a callback.

## 2023-03-30 ENCOUNTER — Telehealth: Payer: Self-pay | Admitting: Physician Assistant

## 2023-03-30 NOTE — Telephone Encounter (Signed)
Copied from CRM [id number]. Topic: Medicare AWV >> Mar 30, 2023 12:18 PM Gwenith Spitz wrote: Reason for CRM: Called patient to schedule Medicare Annual Wellness Visit (AWV). Left message for patient to call back and schedule Medicare Annual Wellness Visit (AWV).  Last date of AWV: N/A  Please schedule AWVS with Inetta Fermo, [facility].  If any questions, please contact me at [phone].  Thank you ,  Gabriel Cirri [facility] AWV TEAM Direct Dial [phone]

## 2023-04-03 ENCOUNTER — Telehealth: Payer: Self-pay | Admitting: Physician Assistant

## 2023-04-03 NOTE — Telephone Encounter (Signed)
Copied from CRM [id number]. Topic: Medicare AWV >> Apr 03, 2023 10:00 AM Gwenith Spitz wrote: Reason for CRM: Called patient to schedule Medicare Annual Wellness Visit (AWV). Left message for patient to call back and schedule Medicare Annual Wellness Visit (AWV).  Last date of AWV: n/a  Please schedule an appointment at any time with Inetta Fermo, [facility]. Please schedule AWVS with Inetta Fermo, [facility].  If any questions, please contact me at [phone].  Thank you ,  Gabriel Cirri [facility] AWV TEAM Direct Dial [phone]

## 2023-04-05 ENCOUNTER — Other Ambulatory Visit (HOSPITAL_COMMUNITY): Payer: Self-pay | Admitting: Psychiatry

## 2023-04-05 ENCOUNTER — Telehealth: Payer: Self-pay | Admitting: Physician Assistant

## 2023-04-05 ENCOUNTER — Ambulatory Visit (INDEPENDENT_AMBULATORY_CARE_PROVIDER_SITE_OTHER)
Admission: RE | Admit: 2023-04-05 | Discharge: 2023-04-05 | Disposition: A | Discharge status: Home / Self Care | Payer: Commercial Managed Care - PPO | Source: Ambulatory Visit | Attending: Physician Assistant | Admitting: Physician Assistant

## 2023-04-05 ENCOUNTER — Encounter (HOSPITAL_BASED_OUTPATIENT_CLINIC_OR_DEPARTMENT_OTHER): Payer: Self-pay | Admitting: Pharmacist

## 2023-04-05 ENCOUNTER — Other Ambulatory Visit (HOSPITAL_BASED_OUTPATIENT_CLINIC_OR_DEPARTMENT_OTHER): Payer: Self-pay

## 2023-04-05 DIAGNOSIS — M898X6 Other specified disorders of bone, lower leg: Secondary | ICD-10-CM | POA: Diagnosis not present

## 2023-04-05 DIAGNOSIS — F902 Attention-deficit hyperactivity disorder, combined type: Secondary | ICD-10-CM

## 2023-04-05 DIAGNOSIS — M79661 Pain in right lower leg: Secondary | ICD-10-CM | POA: Diagnosis not present

## 2023-04-05 NOTE — Telephone Encounter (Signed)
Contacted [facility]dis[facility] to schedule their annual wellness visit. Appointment made for 04/25/2023.  Gabriel Cirri [facility] AWV TEAM Direct Dial [phone]

## 2023-04-06 ENCOUNTER — Other Ambulatory Visit (HOSPITAL_BASED_OUTPATIENT_CLINIC_OR_DEPARTMENT_OTHER): Payer: Self-pay

## 2023-04-12 ENCOUNTER — Telehealth (HOSPITAL_COMMUNITY): Payer: Self-pay | Admitting: *Deleted

## 2023-04-12 ENCOUNTER — Other Ambulatory Visit (HOSPITAL_BASED_OUTPATIENT_CLINIC_OR_DEPARTMENT_OTHER): Payer: Self-pay

## 2023-04-12 DIAGNOSIS — F902 Attention-deficit hyperactivity disorder, combined type: Secondary | ICD-10-CM

## 2023-04-12 MED ORDER — AMPHETAMINE-DEXTROAMPHET ER 20 MG PO CP24
40.0000 mg | ORAL_CAPSULE | Freq: Every day | ORAL | 0 refills | Status: DC
Start: 2023-04-12 — End: 2023-04-20
  Filled 2023-04-12: qty 60, 30d supply, fill #0

## 2023-04-12 NOTE — Telephone Encounter (Signed)
Take 2 capsules (40 mg total) by mouth daily., Starting Mon 03/13/2023, Normal Dose, Route, Frequency: 40 mg, Oral, DailyDx Associated: Start Date & Time: 04/22/2024Ord/Sold: 02/16/2023 (O)Ordered On: 03/28/2024ReportLast Dispense: Refill request denied (Last dispensed: 03/13/2023)Pharmacy: [facility]   Note to Pharmacy: Do not refill until 03/13/23 LAST FILL DATE: 03/13/23

## 2023-04-12 NOTE — Telephone Encounter (Signed)
Take 2 capsules (40 mg total) by mouth daily., Starting Mon 03/13/2023, Normal Dose, Route, Frequency: 40 mg, Oral, DailyDx Associated: Start Date & Time: 04/22/2024Ord/Sold: 02/16/2023 (O)Ordered On: 03/28/2024ReportLast Dispense: Refill request denied (Last dispensed: 03/13/2023)Pharmacy: [facility]   Note to Pharmacy: Do not refill until 03/13/23 LAST FILL DATE: 03/13/23 

## 2023-04-12 NOTE — Telephone Encounter (Signed)
Done

## 2023-04-12 NOTE — Addendum Note (Signed)
Addended by: Kathryne Sharper T on: 04/12/2023 03:45 PM   Modules accepted: Orders

## 2023-04-14 ENCOUNTER — Other Ambulatory Visit: Payer: Self-pay | Admitting: Physician Assistant

## 2023-04-20 ENCOUNTER — Ambulatory Visit (HOSPITAL_BASED_OUTPATIENT_CLINIC_OR_DEPARTMENT_OTHER): Payer: Commercial Managed Care - PPO | Admitting: Psychiatry

## 2023-04-20 ENCOUNTER — Other Ambulatory Visit (HOSPITAL_BASED_OUTPATIENT_CLINIC_OR_DEPARTMENT_OTHER): Payer: Self-pay

## 2023-04-20 ENCOUNTER — Encounter (HOSPITAL_COMMUNITY): Payer: Self-pay | Admitting: Psychiatry

## 2023-04-20 DIAGNOSIS — F902 Attention-deficit hyperactivity disorder, combined type: Secondary | ICD-10-CM

## 2023-04-20 MED ORDER — AMPHETAMINE-DEXTROAMPHET ER 20 MG PO CP24
20.0000 mg | ORAL_CAPSULE | Freq: Every day | ORAL | 0 refills | Status: DC
Start: 2023-04-20 — End: 2023-05-31
  Filled 2023-04-20 – 2023-05-09 (×3): qty 30, 30d supply, fill #0

## 2023-04-20 NOTE — Progress Notes (Signed)
BH MD/PA/NP OP Progress Note   Patient location; office Provider location; office  04/20/2023 3:52 PM Jeremiah Anderson  MRN:  [id number]  Chief Complaint:  Chief Complaint  Patient presents with   Follow-up   HPI: Patient came in for his follow-up appointment.  He recently had spinal fusion for his neck pain.  He is on short-term disability and doing physical therapy.  He is about to start work on June 24 but not sure as it may take another few more weeks.  Though he is not working but is still trying to complete his assignment through [facility] and home work. He is also reading EKG book and sign up for BLS so he should be ready when he go back to work.  Patient told recently he moved in with the fianc and things are little change from the past.  He feels the responsibility since he has to pay the rent of the apartment.  He likes his job and like to continue and hoping to go back to work once he is clear.  He was given muscle relaxant methocarbamol but now switched to baclofen.  His attention concentration is good.  He is sleeping okay.  He still have uneasiness and discomfort on his left side of arm and shoulder but hoping it will get better.  He denies any suicidal thoughts or homicidal thoughts.  His attention concentration is okay.  He has no tremor or shakes but to still have fidgety.  His appetite is okay.  His weight is unchanged from the past.  Today his blood pressure is much better than the past.  He denies drinking or using any illegal substances.  Visit Diagnosis:    ICD-10-CM   1. Attention deficit hyperactivity disorder (ADHD), combined type  F90.2 amphetamine-dextroamphetamine (ADDERALL XR) 20 MG 24 hr capsule       Past Psychiatric History: Reviewed. History of ADHD diagnosed in 2005.  Took Adderall until 2013 but stopped after taking pain medication. 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  No history of suicidal attempt but reported suicidal  thoughts when going through withdrawals.  Denies any history of mania.  Had tried Adderall for a while and then recently Vyvanse but had GI side effects.  He tried Strattera did not help.  Primary care physician tried trazodone for sleep after he was given steroids status post brain surgery when he could not sleep.  History of alcohol on and off but no current use. No history of cocaine or any IV drugs.   Past Medical History:  Past Medical History:  Diagnosis Date   Allergy    Arthritis    Blood transfusion without reported diagnosis    around age 35 after major nausea secondary to prescribed MS Contin   GERD (gastroesophageal reflux disease)    Hyperlipidemia    Hypertension    Migraines    Wears glasses     Past Surgical History:  Procedure Laterality Date   APPENDECTOMY  1980s   post op bleeding sent him back for ex lap and hemostatic intervention.    BACK SURGERY     8 surgeries total - fused from L1-S1   COLONOSCOPY     Age 21. For bleeding   ESOPHAGOGASTRODUODENOSCOPY N/A 06/10/2015   Procedure: ESOPHAGOGASTRODUODENOSCOPY (EGD);  Surgeon: Hart Carwin, MD;  Location[facility] ENDOSCOPY;  Service: Endoscopy;  Laterality: N/A;   ex lap with peri gastric vessel ligation  1990s   after retching, he  rupture a blood vessel "outside" the stomach.  vessel was ligated per pt's descriiption.    INCISIONAL HERNIA REPAIR Right 10/21/2016   Procedure: OPEN FLANK HERNIA REPAIR WITH MESH;  Surgeon: Axel Filler, MD;  Location: [facility] OR;  Service: General;  Laterality: Right;   INSERTION OF MESH Right 10/21/2016   Procedure: INSERTION OF MESH;  Surgeon: Axel Filler, MD;  Location: [facility] OR;  Service: General;  Laterality: Right;   KNEE SURGERY     Multiple bilateral scopes   RETINAL DETACHMENT SURGERY Right    SHOULDER SURGERY     4x on the left - total shoulder replacement.    SHOULDER SURGERY Left    x2   TOTAL SHOULDER ARTHROPLASTY Left    x2    Family Psychiatric History:  Reviewed.  Family History:  Family History  Problem Relation Age of Onset   Arthritis Mother    Hyperlipidemia Mother    Hypertension Father    Cancer Father    Skin cancer Father    Colon cancer Maternal Grandmother    Cancer Maternal Grandmother    Hypertension Sister    Hypertension Paternal Grandmother    Heart attack Paternal Grandfather    Heart disease Paternal Grandfather    Diabetes Neg Hx    Stroke Neg Hx    Prostate cancer Neg Hx     Social History:  Social History   Socioeconomic History   Marital status: Single    Spouse name: Not on file   Number of children: 0   Years of education: 16   Highest education level: Bachelor's degree (e.g., BA, AB, BS)  Occupational History   Occupation: Disability    Comment: Former Charity fundraiser  Tobacco Use   Smoking status: Former    Years: 24    Types: Cigarettes   Smokeless tobacco: Never  Building services engineer Use: Never used  Substance and Sexual Activity   Alcohol use: Not Currently    Alcohol/week: 0.0 standard drinks of alcohol   Drug use: No   Sexual activity: Not on file  Other Topics Concern   Not on file  Social History Narrative   Fun: play guitar / limited funds      Denies any religious beliefs effecting health care.    Social Determinants of Health   Financial Resource Strain: Low Risk  (03/07/2023)   Overall Financial Resource Strain (CARDIA)    Difficulty of Paying Living Expenses: Not hard at all  Food Insecurity: No Food Insecurity (03/07/2023)   Hunger Vital Sign    Worried About Running Out of Food in the Last Year: Never true    Ran Out of Food in the Last Year: Never true  Transportation Needs: No Transportation Needs (03/07/2023)   PRAPARE - Administrator, Civil Service (Medical): No    Lack of Transportation (Non-Medical): No  Physical Activity: Insufficiently Active (03/07/2023)   Exercise Vital Sign    Days of Exercise per Week: 4 days    Minutes of Exercise per Session: 30 min   Stress: Stress Concern Present (03/07/2023)   [employer] of Occupational Health - Occupational Stress Questionnaire    Feeling of Stress : To some extent  Social Connections: Unknown (03/07/2023)   Social Connection and Isolation Panel [NHANES]    Frequency of Communication with Friends and Family: Once a week    Frequency of Social Gatherings with Friends and Family: Once a week    Attends Religious Services: Patient declined  Active Member of Clubs or Organizations: No    Attends Banker Meetings: Not on file    Marital Status: Living with partner    Allergies:  Allergies  Allergen Reactions   Fentanyl Other (See Comments)    Hallucinations (with the patch)  Other Reaction(s): Other (See Comments)  Hears voice   Fentanyl Patch   Lisinopril Swelling   Lyrica [Pregabalin]     hallucinations   Metoprolol Other (See Comments)   Sulfa Antibiotics Swelling    Metabolic Disorder Labs: No results found for: "HGBA1C", "MPG" No results found for: "PROLACTIN" Lab Results  Component Value Date   CHOL 150 04/11/2022   TRIG 107.0 04/11/2022   HDL 49.30 04/11/2022   CHOLHDL 3 04/11/2022   VLDL 21.4 04/11/2022   LDLCALC 80 04/11/2022   LDLCALC 79 11/04/2021   Lab Results  Component Value Date   TSH 0.93 11/04/2021   TSH 0.73 11/04/2020    Therapeutic Level Labs: No results found for: "LITHIUM" No results found for: "VALPROATE" No results found for: "CBMZ"  Current Medications: Current Outpatient Medications  Medication Sig Dispense Refill   amLODipine (NORVASC) 10 MG tablet Take 1 tablet (10 mg total) by mouth daily. 90 tablet 1   amphetamine-dextroamphetamine (ADDERALL XR) 20 MG 24 hr capsule Take 2 capsules (40 mg total) by mouth daily. 60 capsule 0   Baclofen 5 MG TABS Take 1/2 to 1 tablet (5 mg total) by mouth 3 (three) times daily as needed. 90 tablet 1   butalbital-acetaminophen-caffeine (BAC) 50-325-40 MG tablet Take 1 tablet by mouth every  six hours as needed. 120 tablet 1   Cholecalciferol (VITAMIN D3) 25 MCG (1000 UT) CAPS Take by mouth daily.     Coenzyme Q10 (COQ10) 100 MG CAPS Take 100 mg by mouth daily.     cyanocobalamin 100 MCG tablet Take 100 mcg by mouth daily.     fluticasone (FLONASE) 50 MCG/ACT nasal spray Place 2 sprays into both nostrils daily. 16 g 11   Lidocaine 4 % PTCH Place 1-2 patches onto the skin 2 (two) times daily as needed (for pain.). Salonpas Maximum Strength Pain Relieving Gel-Patch     meloxicam (MOBIC) 15 MG tablet Take 1 tablet (15 mg total) by mouth daily as needed. 30 tablet 2   morphine (MSIR) 30 MG tablet Take 30 mg by mouth every 4 (four) hours as needed.     Multiple Vitamin (MULTIVITAMIN WITH MINERALS) TABS tablet Take 1 tablet by mouth daily.     Omega-3 Fatty Acids (OMEGA 3 PO) Take 120 mg by mouth daily.     ondansetron (ZOFRAN) 4 MG tablet TAKE 1 TABLET BY MOUTH EVERY 8 HOURS AS NEEDED FOR NAUSEA AND VOMITING 30 tablet 3   rosuvastatin (CRESTOR) 20 MG tablet Take 1 tablet (20 mg total) by mouth daily. 90 tablet 1   SUMAtriptan (IMITREX) 100 MG tablet MAY REPEAT IN 2 HOURS IF HEADACHE PERSISTS OR RECURS. 9 tablet 3   tazarotene (TAZORAC) 0.1 % gel APPLY A SMALL AMOUNT TO AFFECTED AREA AT BEDTIME     testosterone cypionate (DEPOTESTOSTERONE CYPIONATE) 200 MG/ML injection      butalbital-acetaminophen-caffeine (FIORICET) 50-325-40 MG tablet Take 1 tablet by mouth every 6 (six) hours as needed. 120 tablet 1   methylPREDNISolone (MEDROL DOSEPAK) 4 MG TBPK tablet Take as directed on package. 21 tablet 0   No current facility-administered medications for this visit.     Musculoskeletal: Strength & Muscle Tone: within normal limits Gait &  Station: normal Patient leans: N/A  Psychiatric Specialty Exam: Review of Systems  Musculoskeletal:  Positive for neck pain.    Blood pressure 138/85, pulse (!) 111, height 5\' 10"  (1.778 m), weight 172 lb (78 kg).Body mass index is 24.68 kg/m.   General Appearance: Casual  Eye Contact:  Good  Speech:  Normal Rate  Volume:  Normal  Mood:  Euthymic  Affect:  Congruent  Thought Process:  Goal Directed  Orientation:  Full (Time, Place, and Person)  Thought Content: Logical   Suicidal Thoughts:  No  Homicidal Thoughts:  No  Memory:  Immediate;   Fair Recent;   Good Remote;   Good  Judgement:  Intact  Insight:  Present  Psychomotor Activity:  Restlessness and fidgety  Concentration:  Concentration: Fair and Attention Span: Fair  Recall:  Good  Fund of Knowledge: Good  Language: Good  Akathisia:  No  Handed:  Right  AIMS (if indicated): not done  Assets:  Communication Skills Desire for Improvement Housing Talents/Skills Transportation  ADL's:  Intact  Cognition: WNL  Sleep:  Fair   Screenings: GAD-7    Flowsheet Row Office Visit from 12/22/2022 in [facility]  Total GAD-7 Score 2      PHQ2-9    Flowsheet Row Office Visit from 12/22/2022 in [facility] Visit from 12/15/2022 in [facility] Office Visit from 04/11/2022 in [facility] [employer] from 12/13/2017 in [facility]  PHQ-2 Total Score 1 1 0 0  PHQ-9 Total Score 3 5 -- --      Flowsheet Row Office Visit from 12/15/2022 in [facility]  C-SSRS RISK CATEGORY Error: Question 6 not populated        Assessment and Plan: ADHD, combined type  Patient recently had spinal fusion and he is currently not working but is still busy at home and trying to read EKG book and try to complete her assignment.  I reviewed current medication. He feels attention concentration is good.  Reviewed current medication, psychosocial stressors.  I recommend since he is not working to try to cut down the dose to take only 1 a day until he go back to work.  Patient agreed with the plan.  We will provide  Adderall XR 20 mg once a day until he resume his work full-time in the future.  Recommend to call us back if is any question or any concern.  Follow-up in 3 months.  Today's blood pressure is okay.  He understands if he started to have high blood pressure reading then we will need to cut down the dose.  Patient is also taking morphine and muscle relaxant but hoping to come off from muscle relaxant in the future once he go back to work.   Collaboration of Care: Collaboration of Care: Other provider involved in patient's care AEB notes are available in epic to review.  Patient/Guardian was advised Release of Information must be obtained prior to any record release in order to collaborate their care with an outside provider. Patient/Guardian was advised if they have not already done so to contact the registration department to sign all necessary forms in order for [place] to release information regarding their care.   Consent: Patient/Guardian gives verbal consent for treatment and assignment of benefits for services provided during this visit. Patient/Guardian expressed understanding and agreed to proceed.    Cleotis Nipper, MD 04/20/2023, 3:52 PMPatient ID: Jeremiah Anderson, male   DOB: 08-20-1966, 57 y.o.   MRN: [id number]

## 2023-04-21 DIAGNOSIS — M4312 Spondylolisthesis, cervical region: Secondary | ICD-10-CM | POA: Diagnosis not present

## 2023-04-21 DIAGNOSIS — M4802 Spinal stenosis, cervical region: Secondary | ICD-10-CM | POA: Diagnosis not present

## 2023-04-21 DIAGNOSIS — Z9889 Other specified postprocedural states: Secondary | ICD-10-CM | POA: Diagnosis not present

## 2023-04-21 DIAGNOSIS — M47812 Spondylosis without myelopathy or radiculopathy, cervical region: Secondary | ICD-10-CM | POA: Diagnosis not present

## 2023-04-24 DIAGNOSIS — M419 Scoliosis, unspecified: Secondary | ICD-10-CM | POA: Diagnosis not present

## 2023-04-24 DIAGNOSIS — M4030 Flatback syndrome, site unspecified: Secondary | ICD-10-CM | POA: Diagnosis not present

## 2023-04-24 DIAGNOSIS — M4326 Fusion of spine, lumbar region: Secondary | ICD-10-CM | POA: Diagnosis not present

## 2023-04-25 ENCOUNTER — Other Ambulatory Visit: Payer: Self-pay

## 2023-04-25 ENCOUNTER — Other Ambulatory Visit (HOSPITAL_BASED_OUTPATIENT_CLINIC_OR_DEPARTMENT_OTHER): Payer: Self-pay

## 2023-04-25 ENCOUNTER — Ambulatory Visit (INDEPENDENT_AMBULATORY_CARE_PROVIDER_SITE_OTHER): Payer: Commercial Managed Care - PPO

## 2023-04-25 VITALS — Wt 172.0 lb

## 2023-04-25 DIAGNOSIS — Z0000 Encounter for general adult medical examination without abnormal findings: Secondary | ICD-10-CM | POA: Diagnosis not present

## 2023-04-25 NOTE — Patient Instructions (Signed)
[name] , Thank you for taking time to come for your Medicare Wellness Visit. I appreciate your ongoing commitment to your health goals. Please review the following plan we discussed and let me know if I can assist you in the future.   These are the goals we discussed:  Goals      Patient Stated     Want to get back to work         This is a list of the screening recommended for you and due dates:  Health Maintenance  Topic Date Due   COVID-19 Vaccine (6 - 2023-24 season) 11/10/2022   Flu Shot  06/22/2023   Medicare Annual Wellness Visit  04/24/2024   Colon Cancer Screening  04/06/2027   DTaP/Tdap/Td vaccine (2 - Tdap) 11/19/2027   Hepatitis C Screening  Completed   HIV Screening  Completed   Zoster (Shingles) Vaccine  Completed   HPV Vaccine  Aged Out    Advanced directives: Advance directive discussed with you today. Even though you declined this today please call our office should you change your mind and we can give you the proper paperwork for you to fill out.  Conditions/risks identified: get back to work   Next appointment: Follow up in one year for your annual wellness visit   Preventive Care 40-64 Years, Male Preventive care refers to lifestyle choices and visits with your health care provider that can promote health and wellness. What does preventive care include? A yearly physical exam. This is also called an annual well check. Dental exams once or twice a year. Routine eye exams. Ask your health care provider how often you should have your eyes checked. Personal lifestyle choices, including: Daily care of your teeth and gums. Regular physical activity. Eating a healthy diet. Avoiding tobacco and drug use. Limiting alcohol use. Practicing safe sex. Taking low-dose aspirin every day starting at age 58. What happens during an annual well check? The services and screenings done by your health care provider during your annual well check will depend on your age,  overall health, lifestyle risk factors, and family history of disease. Counseling  Your health care provider may ask you questions about your: Alcohol use. Tobacco use. Drug use. Emotional well-being. Home and relationship well-being. Sexual activity. Eating habits. Work and work Astronomer. Screening  You may have the following tests or measurements: Height, weight, and BMI. Blood pressure. Lipid and cholesterol levels. These may be checked every 5 years, or more frequently if you are over 72 years old. Skin check. Lung cancer screening. You may have this screening every year starting at age 72 if you have a 30-pack-year history of smoking and currently smoke or have quit within the past 15 years. Fecal occult blood test (FOBT) of the stool. You may have this test every year starting at age 90. Flexible sigmoidoscopy or colonoscopy. You may have a sigmoidoscopy every 5 years or a colonoscopy every 10 years starting at age 70. Prostate cancer screening. Recommendations will vary depending on your family history and other risks. Hepatitis C blood test. Hepatitis B blood test. Sexually transmitted disease (STD) testing. Diabetes screening. This is done by checking your blood sugar (glucose) after you have not eaten for a while (fasting). You may have this done every 1-3 years. Discuss your test results, treatment options, and if necessary, the need for more tests with your health care provider. Vaccines  Your health care provider may recommend certain vaccines, such as: Influenza vaccine. This is  recommended every year. Tetanus, diphtheria, and acellular pertussis (Tdap, Td) vaccine. You may need a Td booster every 10 years. Zoster vaccine. You may need this after age 9. Pneumococcal 13-valent conjugate (PCV13) vaccine. You may need this if you have certain conditions and have not been vaccinated. Pneumococcal polysaccharide (PPSV23) vaccine. You may need one or two doses if you smoke  cigarettes or if you have certain conditions. Talk to your health care provider about which screenings and vaccines you need and how often you need them. This information is not intended to replace advice given to you by your health care provider. Make sure you discuss any questions you have with your health care provider. Document Released: 12/04/2015 Document Revised: 07/27/2016 Document Reviewed: 09/08/2015 Elsevier Interactive Patient Education  2017 [employer].  Fall Prevention in the Home Falls can cause injuries. They can happen to people of all ages. There are many things you can do to make your home safe and to help prevent falls. What can I do on the outside of my home? Regularly fix the edges of walkways and driveways and fix any cracks. Remove anything that might make you trip as you walk through a door, such as a raised step or threshold. Trim any bushes or trees on the path to your home. Use bright outdoor lighting. Clear any walking paths of anything that might make someone trip, such as rocks or tools. Regularly check to see if handrails are loose or broken. Make sure that both sides of any steps have handrails. Any raised decks and porches should have guardrails on the edges. Have any leaves, snow, or ice cleared regularly. Use sand or salt on walking paths during winter. Clean up any spills in your garage right away. This includes oil or grease spills. What can I do in the bathroom? Use night lights. Install grab bars by the toilet and in the tub and shower. Do not use towel bars as grab bars. Use non-skid mats or decals in the tub or shower. If you need to sit down in the shower, use a plastic, non-slip stool. Keep the floor dry. Clean up any water that spills on the floor as soon as it happens. Remove soap buildup in the tub or shower regularly. Attach bath mats securely with double-sided non-slip rug tape. Do not have throw rugs and other things on the floor that  can make you trip. What can I do in the bedroom? Use night lights. Make sure that you have a light by your bed that is easy to reach. Do not use any sheets or blankets that are too big for your bed. They should not hang down onto the floor. Have a firm chair that has side arms. You can use this for support while you get dressed. Do not have throw rugs and other things on the floor that can make you trip. What can I do in the kitchen? Clean up any spills right away. Avoid walking on wet floors. Keep items that you use a lot in easy-to-reach places. If you need to reach something above you, use a strong step stool that has a grab bar. Keep electrical cords out of the way. Do not use floor polish or wax that makes floors slippery. If you must use wax, use non-skid floor wax. Do not have throw rugs and other things on the floor that can make you trip. What can I do with my stairs? Do not leave any items on the stairs. Make sure  that there are handrails on both sides of the stairs and use them. Fix handrails that are broken or loose. Make sure that handrails are as long as the stairways. Check any carpeting to make sure that it is firmly attached to the stairs. Fix any carpet that is loose or worn. Avoid having throw rugs at the top or bottom of the stairs. If you do have throw rugs, attach them to the floor with carpet tape. Make sure that you have a light switch at the top of the stairs and the bottom of the stairs. If you do not have them, ask someone to add them for you. What else can I do to help prevent falls? Wear shoes that: Do not have high heels. Have rubber bottoms. Are comfortable and fit you well. Are closed at the toe. Do not wear sandals. If you use a stepladder: Make sure that it is fully opened. Do not climb a closed stepladder. Make sure that both sides of the stepladder are locked into place. Ask someone to hold it for you, if possible. Clearly mark and make sure that you  can see: Any grab bars or handrails. First and last steps. Where the edge of each step is. Use tools that help you move around (mobility aids) if they are needed. These include: Canes. Walkers. Scooters. Crutches. Turn on the lights when you go into a dark area. Replace any light bulbs as soon as they burn out. Set up your furniture so you have a clear path. Avoid moving your furniture around. If any of your floors are uneven, fix them. If there are any pets around you, be aware of where they are. Review your medicines with your doctor. Some medicines can make you feel dizzy. This can increase your chance of falling. Ask your doctor what other things that you can do to help prevent falls. This information is not intended to replace advice given to you by your health care provider. Make sure you discuss any questions you have with your health care provider. Document Released: 09/03/2009 Document Revised: 04/14/2016 Document Reviewed: 12/12/2014 Elsevier Interactive Patient Education  2017 [employer].

## 2023-04-25 NOTE — Progress Notes (Signed)
I connected with  Jeremiah Anderson on 04/25/23 by a audio enabled telemedicine application and verified that I am speaking with the correct person using two identifiers.  Patient Location: Home  Provider Location: Office/Clinic  I discussed the limitations of evaluation and management by telemedicine. The patient expressed understanding and agreed to proceed.   Subjective:   Jeremiah Anderson is a 57 y.o. male who presents for Medicare Annual/Subsequent preventive examination.  Review of Systems     Cardiac Risk Factors include: advanced age (>10men, >70 women);male gender;hypertension;dyslipidemia     Objective:    Today's Vitals   04/25/23 1451  Weight: 172 lb (78 kg)   Body mass index is 24.68 kg/m.     04/25/2023    2:59 PM 04/20/2023    3:45 PM 10/18/2016    1:40 PM 06/10/2015    1:00 AM  Advanced Directives  Does Patient Have a Medical Advance Directive? No  No Yes  Type of Advance Directive    Living will;Healthcare Power of Attorney  Does patient want to make changes to medical advance directive?    No - Patient declined  Would patient like information on creating a medical advance directive? No - Patient declined  Yes (MAU/Ambulatory/Procedural Areas - Information given)      Information is confidential and restricted. Go to Review Flowsheets to unlock data.    Current Medications (verified) Outpatient Encounter Medications as of 04/25/2023  Medication Sig   amLODipine (NORVASC) 10 MG tablet Take 1 tablet (10 mg total) by mouth daily.   amphetamine-dextroamphetamine (ADDERALL XR) 20 MG 24 hr capsule Take 1 capsule (20 mg total) by mouth daily.   Baclofen 5 MG TABS Take 1/2 to 1 tablet (5 mg total) by mouth 3 (three) times daily as needed.   butalbital-acetaminophen-caffeine (BAC) 50-325-40 MG tablet Take 1 tablet by mouth every six hours as needed.   Cholecalciferol (VITAMIN D3) 25 MCG (1000 UT) CAPS Take by mouth daily.   Coenzyme Q10 (COQ10) 100 MG CAPS Take 100 mg by  mouth daily.   cyanocobalamin 100 MCG tablet Take 100 mcg by mouth daily.   fluticasone (FLONASE) 50 MCG/ACT nasal spray Place 2 sprays into both nostrils daily.   Lidocaine 4 % PTCH Place 1-2 patches onto the skin 2 (two) times daily as needed (for pain.). Salonpas Maximum Strength Pain Relieving Gel-Patch   meloxicam (MOBIC) 15 MG tablet Take 1 tablet (15 mg total) by mouth daily as needed.   morphine (MSIR) 30 MG tablet Take 30 mg by mouth every 4 (four) hours as needed.   Multiple Vitamin (MULTIVITAMIN WITH MINERALS) TABS tablet Take 1 tablet by mouth daily.   naloxone (NARCAN) nasal spray 4 mg/0.1 mL Place into the nose.   Omega-3 Fatty Acids (OMEGA 3 PO) Take 120 mg by mouth daily.   ondansetron (ZOFRAN) 4 MG tablet TAKE 1 TABLET BY MOUTH EVERY 8 HOURS AS NEEDED FOR NAUSEA AND VOMITING   rosuvastatin (CRESTOR) 20 MG tablet Take 1 tablet (20 mg total) by mouth daily.   SUMAtriptan (IMITREX) 100 MG tablet MAY REPEAT IN 2 HOURS IF HEADACHE PERSISTS OR RECURS.   tazarotene (TAZORAC) 0.1 % gel APPLY A SMALL AMOUNT TO AFFECTED AREA AT BEDTIME   testosterone cypionate (DEPOTESTOSTERONE CYPIONATE) 200 MG/ML injection    No facility-administered encounter medications on file as of 04/25/2023.    Allergies (verified) Fentanyl, Lisinopril, Lyrica [pregabalin], Metoprolol, and Sulfa antibiotics   History: Past Medical History:  Diagnosis Date   Allergy  Arthritis    Blood transfusion without reported diagnosis    around age 47 after major nausea secondary to prescribed MS Contin   GERD (gastroesophageal reflux disease)    Hyperlipidemia    Hypertension    Migraines    Wears glasses    Past Surgical History:  Procedure Laterality Date   APPENDECTOMY  1980s   post op bleeding sent him back for ex lap and hemostatic intervention.    BACK SURGERY     8 surgeries total - fused from L1-S1   COLONOSCOPY     Age 57. For bleeding   ESOPHAGOGASTRODUODENOSCOPY N/A 06/10/2015   Procedure:  ESOPHAGOGASTRODUODENOSCOPY (EGD);  Surgeon: Hart Carwin, MD;  Location: [facility] ENDOSCOPY;  Service: Endoscopy;  Laterality: N/A;   ex lap with peri gastric vessel ligation  1990s   after retching, he rupture a blood vessel "outside" the stomach.  vessel was ligated per pt's descriiption.    INCISIONAL HERNIA REPAIR Right 10/21/2016   Procedure: OPEN FLANK HERNIA REPAIR WITH MESH;  Surgeon: Axel Filler, MD;  Location: [facility] OR;  Service: General;  Laterality: Right;   INSERTION OF MESH Right 10/21/2016   Procedure: INSERTION OF MESH;  Surgeon: Axel Filler, MD;  Location: [facility] OR;  Service: General;  Laterality: Right;   KNEE SURGERY     Multiple bilateral scopes   RETINAL DETACHMENT SURGERY Right    SHOULDER SURGERY     4x on the left - total shoulder replacement.    SHOULDER SURGERY Left    x2   TOTAL SHOULDER ARTHROPLASTY Left    x2   Family History  Problem Relation Age of Onset   Arthritis Mother    Hyperlipidemia Mother    Hypertension Father    Cancer Father    Skin cancer Father    Colon cancer Maternal Grandmother    Cancer Maternal Grandmother    Hypertension Sister    Hypertension Paternal Grandmother    Heart attack Paternal Grandfather    Heart disease Paternal Grandfather    Diabetes Neg Hx    Stroke Neg Hx    Prostate cancer Neg Hx    Social History   Socioeconomic History   Marital status: Single    Spouse name: Not on file   Number of children: 0   Years of education: 16   Highest education level: Bachelor's degree (e.g., BA, AB, BS)  Occupational History   Occupation: Disability    Comment: Former Charity fundraiser  Tobacco Use   Smoking status: Former    Years: 24    Types: Cigarettes   Smokeless tobacco: Never  Building services engineer Use: Never used  Substance and Sexual Activity   Alcohol use: Not Currently    Alcohol/week: 0.0 standard drinks of alcohol   Drug use: No   Sexual activity: Not on file  Other Topics Concern   Not on file  Social History  Narrative   Fun: play guitar / limited funds      Denies any religious beliefs effecting health care.    Social Determinants of Health   Financial Resource Strain: Low Risk  (04/24/2023)   Overall Financial Resource Strain (CARDIA)    Difficulty of Paying Living Expenses: Not very hard  Food Insecurity: No Food Insecurity (04/24/2023)   Hunger Vital Sign    Worried About Running Out of Food in the Last Year: Never true    Ran Out of Food in the Last Year: Never true  Transportation Needs: No Transportation  Needs (04/24/2023)   PRAPARE - Administrator, Civil Service (Medical): No    Lack of Transportation (Non-Medical): No  Physical Activity: Insufficiently Active (04/24/2023)   Exercise Vital Sign    Days of Exercise per Week: 3 days    Minutes of Exercise per Session: 30 min  Stress: Stress Concern Present (04/24/2023)   [employer] of Occupational Health - Occupational Stress Questionnaire    Feeling of Stress : To some extent  Social Connections: Unknown (04/24/2023)   Social Connection and Isolation Panel [NHANES]    Frequency of Communication with Friends and Family: Once a week    Frequency of Social Gatherings with Friends and Family: Once a week    Attends Religious Services: Patient declined    Database administrator or Organizations: No    Attends Engineer, structural: Never    Marital Status: Living with partner    Tobacco Counseling Counseling given: Not Answered   Clinical Intake:  Pre-visit preparation completed: Yes  Pain : No/denies pain     BMI - recorded: 24.68 Nutritional Status: BMI of 19-24  Normal Diabetes: No  How often do you need to have someone help you when you read instructions, pamphlets, or other written materials from your doctor or pharmacy?: 1 - Never  Diabetic?no  Interpreter Needed?: No  Information entered by :: Lanier Ensign, LPN   Activities of Daily Living    04/24/2023   11:06 PM  In your present  state of health, do you have any difficulty performing the following activities:  Hearing? 0  Vision? 0  Difficulty concentrating or making decisions? 0  Walking or climbing stairs? 0  Dressing or bathing? 0  Doing errands, shopping? 0  Preparing Food and eating ? N  Using the Toilet? N  In the past six months, have you accidently leaked urine? N  Do you have problems with loss of bowel control? N  Managing your Medications? N  Managing your Finances? N  Housekeeping or managing your Housekeeping? N    Patient Care Team: Allwardt, [name], PA-C as PCP - General (Physician Assistant) Jodelle Red, MD as PCP - Cardiology (Cardiology) Bjorn Pippin, MD as Attending Physician (Urology)  Indicate any recent Medical Services you may have received from other than [facility] providers in the past year (date may be approximate).     Assessment:   This is a routine wellness examination for Antonia.  Hearing/Vision screen Hearing Screening - Comments:: Pt denies any hearing issues  Vision Screening - Comments:: Pt [facility] for annual eye exams   Dietary issues and exercise activities discussed: Current Exercise Habits: Home exercise routine, Type of exercise: walking, Time (Minutes): 30, Frequency (Times/Week): 3, Weekly Exercise (Minutes/Week): 90   Goals Addressed             This Visit's Progress    Patient Stated       Want to get back to work        Depression Screen    04/25/2023    2:58 PM 12/22/2022   10:39 AM 12/15/2022    1:42 PM 12/15/2022    1:41 PM 04/11/2022   12:05 PM 12/13/2017    3:26 PM  PHQ 2/9 Scores  PHQ - 2 Score 0 1   0 0  PHQ- 9 Score  3         Information is confidential and restricted. Go to Review Flowsheets to unlock data.  Fall Risk    04/24/2023   11:06 PM 12/22/2022   10:39 AM 05/02/2018    9:17 AM 12/13/2017    3:26 PM  Fall Risk   Falls in the past year? 0 0 No Yes  Number falls in past yr:  0  1  Injury with  Fall?  0  No  Risk for fall due to : Impaired vision     Follow up Falls prevention discussed       FALL RISK PREVENTION PERTAINING TO THE HOME:  Any stairs in or around the home? Yes  If so, are there any without handrails? No  Home free of loose throw rugs in walkways, pet beds, electrical cords, etc? Yes  Adequate lighting in your home to reduce risk of falls? Yes   ASSISTIVE DEVICES UTILIZED TO PREVENT FALLS:  Life alert? No  Use of a cane, walker or w/c? No  Grab bars in the bathroom? No  Shower chair or bench in shower? No  Elevated toilet seat or a handicapped toilet? No   TIMED UP AND GO:  Was the test performed? No .   Cognitive Function:        04/25/2023    3:02 PM  6CIT Screen  What Year? 0 points  What month? 0 points  What time? 0 points  Count back from 20 0 points  Months in reverse 0 points  Repeat phrase 0 points  Total Score 0 points    Immunizations Immunization History  Administered Date(s) Administered   Hepatitis B, ADULT 05/31/2021   Influenza,inj,Quad PF,6+ Mos 09/15/2022   PFIZER(Purple Top)SARS-COV-2 Vaccination 02/09/2020, 03/01/2020, 09/29/2020   PPD Test 01/22/2018, 05/31/2021   Pfizer Covid-19 Vaccine Bivalent Booster 67yrs & up 02/23/2022, 09/15/2022   Td 11/18/2017   Zoster Recombinat (Shingrix) 04/11/2022, 03/15/2023    TDAP status: Up to date  Flu Vaccine status: Up to date    Covid-19 vaccine status: Information provided on how to obtain vaccines.   Qualifies for Shingles Vaccine? Yes   Zostavax completed Yes   Shingrix Completed?: Yes  Screening Tests Health Maintenance  Topic Date Due   COVID-19 Vaccine (6 - 2023-24 season) 11/10/2022   INFLUENZA VACCINE  06/22/2023   Medicare Annual Wellness (AWV)  04/24/2024   Colonoscopy  04/06/2027   DTaP/Tdap/Td (2 - Tdap) 11/19/2027   Hepatitis C Screening  Completed   HIV Screening  Completed   Zoster Vaccines- Shingrix  Completed   HPV VACCINES  Aged Out     Health Maintenance  Health Maintenance Due  Topic Date Due   COVID-19 Vaccine (6 - 2023-24 season) 11/10/2022    Colorectal cancer screening: Type of screening: Colonoscopy. Completed 04/05/17. Repeat every 10 years  Additional Screening:  Hepatitis C Screening:  Completed 04/11/22  Vision Screening: Recommended annual ophthalmology exams for early detection of glaucoma and other disorders of the eye. Is the patient up to date with their annual eye exam?  Yes  Who is the provider or what is the name of the office in which the patient attends annual eye exams? [facility]  If pt is not established with a provider, would they like to be referred to a provider to establish care? No .   Dental Screening: Recommended annual dental exams for proper oral hygiene  Community Resource Referral / Chronic Care Management: CRR required this visit?  No   CCM required this visit?  No      Plan:     I have personally  reviewed and noted the following in the patient's chart:   Medical and social history Use of alcohol, tobacco or illicit drugs  Current medications and supplements including opioid prescriptions. Patient is currently taking opioid prescriptions. Information provided to patient regarding non-opioid alternatives. Patient advised to discuss non-opioid treatment plan with their provider. Functional ability and status Nutritional status Physical activity Advanced directives List of other physicians Hospitalizations, surgeries, and ER visits in previous 12 months Vitals Screenings to include cognitive, depression, and falls Referrals and appointments  In addition, I have reviewed and discussed with patient certain preventive protocols, quality metrics, and best practice recommendations. A written personalized care plan for preventive services as well as general preventive health recommendations were provided to patient.     Marzella Schlein, LPN   11/26/1094   Nurse  Notes: none

## 2023-04-26 DIAGNOSIS — M4802 Spinal stenosis, cervical region: Secondary | ICD-10-CM | POA: Diagnosis not present

## 2023-04-26 DIAGNOSIS — M47818 Spondylosis without myelopathy or radiculopathy, sacral and sacrococcygeal region: Secondary | ICD-10-CM | POA: Diagnosis not present

## 2023-04-26 DIAGNOSIS — M62830 Muscle spasm of back: Secondary | ICD-10-CM | POA: Diagnosis not present

## 2023-04-26 DIAGNOSIS — M4712 Other spondylosis with myelopathy, cervical region: Secondary | ICD-10-CM | POA: Diagnosis not present

## 2023-05-08 DIAGNOSIS — M542 Cervicalgia: Secondary | ICD-10-CM | POA: Diagnosis not present

## 2023-05-09 ENCOUNTER — Other Ambulatory Visit (HOSPITAL_BASED_OUTPATIENT_CLINIC_OR_DEPARTMENT_OTHER): Payer: Self-pay

## 2023-05-10 ENCOUNTER — Other Ambulatory Visit: Payer: Self-pay

## 2023-05-12 DIAGNOSIS — M542 Cervicalgia: Secondary | ICD-10-CM | POA: Diagnosis not present

## 2023-05-15 DIAGNOSIS — M542 Cervicalgia: Secondary | ICD-10-CM | POA: Diagnosis not present

## 2023-05-16 ENCOUNTER — Other Ambulatory Visit (HOSPITAL_BASED_OUTPATIENT_CLINIC_OR_DEPARTMENT_OTHER): Payer: Self-pay

## 2023-05-17 DIAGNOSIS — M542 Cervicalgia: Secondary | ICD-10-CM | POA: Diagnosis not present

## 2023-05-18 ENCOUNTER — Telehealth (HOSPITAL_COMMUNITY): Payer: Self-pay

## 2023-05-18 NOTE — Telephone Encounter (Signed)
Of a, once he returned to work we will review his medication.

## 2023-05-18 NOTE — Telephone Encounter (Signed)
Patient is calling to let you know that he is scheduled to return to work on 7/15. He stated you lowered his Adderall while he was out on short term disability and to let you know when he was going back to work.

## 2023-05-20 ENCOUNTER — Other Ambulatory Visit (HOSPITAL_BASED_OUTPATIENT_CLINIC_OR_DEPARTMENT_OTHER): Payer: Self-pay

## 2023-05-22 ENCOUNTER — Other Ambulatory Visit (HOSPITAL_BASED_OUTPATIENT_CLINIC_OR_DEPARTMENT_OTHER): Payer: Self-pay

## 2023-05-22 DIAGNOSIS — Z79891 Long term (current) use of opiate analgesic: Secondary | ICD-10-CM | POA: Diagnosis not present

## 2023-05-22 DIAGNOSIS — M62830 Muscle spasm of back: Secondary | ICD-10-CM | POA: Diagnosis not present

## 2023-05-22 DIAGNOSIS — G894 Chronic pain syndrome: Secondary | ICD-10-CM | POA: Diagnosis not present

## 2023-05-22 DIAGNOSIS — M542 Cervicalgia: Secondary | ICD-10-CM | POA: Diagnosis not present

## 2023-05-22 DIAGNOSIS — M47818 Spondylosis without myelopathy or radiculopathy, sacral and sacrococcygeal region: Secondary | ICD-10-CM | POA: Diagnosis not present

## 2023-05-22 MED ORDER — BUTALBITAL-APAP-CAFFEINE 50-325-40 MG PO TABS
1.0000 | ORAL_TABLET | Freq: Four times a day (QID) | ORAL | 1 refills | Status: DC | PRN
  Filled 2023-05-22: qty 120, 30d supply, fill #0
  Filled 2023-06-16 – 2023-06-19 (×2): qty 120, 30d supply, fill #1

## 2023-05-23 ENCOUNTER — Other Ambulatory Visit (HOSPITAL_BASED_OUTPATIENT_CLINIC_OR_DEPARTMENT_OTHER): Payer: Self-pay

## 2023-05-23 DIAGNOSIS — E291 Testicular hypofunction: Secondary | ICD-10-CM | POA: Diagnosis not present

## 2023-05-24 DIAGNOSIS — L570 Actinic keratosis: Secondary | ICD-10-CM | POA: Diagnosis not present

## 2023-05-24 DIAGNOSIS — L739 Follicular disorder, unspecified: Secondary | ICD-10-CM | POA: Diagnosis not present

## 2023-05-24 DIAGNOSIS — M542 Cervicalgia: Secondary | ICD-10-CM | POA: Diagnosis not present

## 2023-05-24 DIAGNOSIS — L738 Other specified follicular disorders: Secondary | ICD-10-CM | POA: Diagnosis not present

## 2023-05-29 DIAGNOSIS — M542 Cervicalgia: Secondary | ICD-10-CM | POA: Diagnosis not present

## 2023-05-30 ENCOUNTER — Telehealth (HOSPITAL_COMMUNITY): Payer: Self-pay | Admitting: *Deleted

## 2023-05-30 DIAGNOSIS — F902 Attention-deficit hyperactivity disorder, combined type: Secondary | ICD-10-CM

## 2023-05-30 NOTE — Telephone Encounter (Signed)
Pt called to inform that he has returned to work, from STD, and is requesting Adderall XR 20 mg be increased back to previous dose of 40 mg every day. Pt has scheduled f/u scheduled for 07/25/23. Please review and advise.

## 2023-05-31 ENCOUNTER — Other Ambulatory Visit (HOSPITAL_BASED_OUTPATIENT_CLINIC_OR_DEPARTMENT_OTHER): Payer: Self-pay

## 2023-05-31 DIAGNOSIS — N5201 Erectile dysfunction due to arterial insufficiency: Secondary | ICD-10-CM | POA: Diagnosis not present

## 2023-05-31 DIAGNOSIS — E230 Hypopituitarism: Secondary | ICD-10-CM | POA: Diagnosis not present

## 2023-05-31 MED ORDER — TESTOSTERONE CYPIONATE 200 MG/ML IM SOLN
100.0000 mg | INTRAMUSCULAR | 1 refills | Status: AC
  Filled 2023-05-31: qty 10, 70d supply, fill #0
  Filled 2023-09-28: qty 10, 70d supply, fill #1

## 2023-05-31 MED ORDER — AMPHETAMINE-DEXTROAMPHET ER 20 MG PO CP24
20.0000 mg | ORAL_CAPSULE | Freq: Two times a day (BID) | ORAL | 0 refills | Status: DC
Start: 2023-05-31 — End: 2023-06-29
  Filled 2023-05-31 – 2023-06-01 (×2): qty 60, 30d supply, fill #0

## 2023-05-31 NOTE — Telephone Encounter (Signed)
I sent a new prescription to his pharmacy.  He need to monitor closely his blood pressure.

## 2023-06-01 ENCOUNTER — Other Ambulatory Visit (HOSPITAL_BASED_OUTPATIENT_CLINIC_OR_DEPARTMENT_OTHER): Payer: Self-pay

## 2023-06-01 DIAGNOSIS — M542 Cervicalgia: Secondary | ICD-10-CM | POA: Diagnosis not present

## 2023-06-02 ENCOUNTER — Other Ambulatory Visit (HOSPITAL_BASED_OUTPATIENT_CLINIC_OR_DEPARTMENT_OTHER): Payer: Self-pay

## 2023-06-02 DIAGNOSIS — R109 Unspecified abdominal pain: Secondary | ICD-10-CM | POA: Diagnosis not present

## 2023-06-02 DIAGNOSIS — M47818 Spondylosis without myelopathy or radiculopathy, sacral and sacrococcygeal region: Secondary | ICD-10-CM | POA: Diagnosis not present

## 2023-06-02 DIAGNOSIS — M62830 Muscle spasm of back: Secondary | ICD-10-CM | POA: Diagnosis not present

## 2023-06-02 DIAGNOSIS — G894 Chronic pain syndrome: Secondary | ICD-10-CM | POA: Diagnosis not present

## 2023-06-05 DIAGNOSIS — M542 Cervicalgia: Secondary | ICD-10-CM | POA: Diagnosis not present

## 2023-06-06 ENCOUNTER — Other Ambulatory Visit: Payer: Self-pay | Admitting: Anesthesiology

## 2023-06-06 DIAGNOSIS — Z711 Person with feared health complaint in whom no diagnosis is made: Secondary | ICD-10-CM

## 2023-06-07 DIAGNOSIS — M542 Cervicalgia: Secondary | ICD-10-CM | POA: Diagnosis not present

## 2023-06-09 ENCOUNTER — Ambulatory Visit
Admission: RE | Admit: 2023-06-09 | Discharge: 2023-06-09 | Disposition: A | Discharge status: Home / Self Care | Payer: Commercial Managed Care - PPO | Source: Ambulatory Visit | Attending: Anesthesiology | Admitting: Anesthesiology

## 2023-06-09 DIAGNOSIS — I700 Atherosclerosis of aorta: Secondary | ICD-10-CM | POA: Diagnosis not present

## 2023-06-09 DIAGNOSIS — Z711 Person with feared health complaint in whom no diagnosis is made: Secondary | ICD-10-CM

## 2023-06-17 ENCOUNTER — Other Ambulatory Visit (HOSPITAL_BASED_OUTPATIENT_CLINIC_OR_DEPARTMENT_OTHER): Payer: Self-pay

## 2023-06-21 DIAGNOSIS — M62830 Muscle spasm of back: Secondary | ICD-10-CM | POA: Diagnosis not present

## 2023-06-21 DIAGNOSIS — R109 Unspecified abdominal pain: Secondary | ICD-10-CM | POA: Diagnosis not present

## 2023-06-21 DIAGNOSIS — G894 Chronic pain syndrome: Secondary | ICD-10-CM | POA: Diagnosis not present

## 2023-06-21 DIAGNOSIS — M47818 Spondylosis without myelopathy or radiculopathy, sacral and sacrococcygeal region: Secondary | ICD-10-CM | POA: Diagnosis not present

## 2023-06-26 ENCOUNTER — Other Ambulatory Visit: Payer: Commercial Managed Care - PPO

## 2023-06-29 ENCOUNTER — Other Ambulatory Visit (HOSPITAL_COMMUNITY): Payer: Self-pay | Admitting: Psychiatry

## 2023-06-29 ENCOUNTER — Telehealth (HOSPITAL_COMMUNITY): Payer: Self-pay | Admitting: *Deleted

## 2023-06-29 ENCOUNTER — Other Ambulatory Visit (HOSPITAL_BASED_OUTPATIENT_CLINIC_OR_DEPARTMENT_OTHER): Payer: Self-pay

## 2023-06-29 DIAGNOSIS — F902 Attention-deficit hyperactivity disorder, combined type: Secondary | ICD-10-CM

## 2023-06-29 MED ORDER — AMPHETAMINE-DEXTROAMPHET ER 20 MG PO CP24
20.0000 mg | ORAL_CAPSULE | Freq: Two times a day (BID) | ORAL | 0 refills | Status: DC
Start: 2023-06-29 — End: 2023-07-04
  Filled 2023-06-29: qty 60, 30d supply, fill #0

## 2023-06-29 NOTE — Telephone Encounter (Signed)
Pt called requesting a refill of the Adderall XR 20 mg BID. Last ordered on 05/31/23 as pt has returned to work. Pt says fill date tomorrow per pharmacy. Pt has a f/u scheduled for 07/25/23. Please send to [facility]

## 2023-06-29 NOTE — Telephone Encounter (Signed)
Done

## 2023-06-30 ENCOUNTER — Other Ambulatory Visit (HOSPITAL_BASED_OUTPATIENT_CLINIC_OR_DEPARTMENT_OTHER): Payer: Self-pay

## 2023-07-01 ENCOUNTER — Other Ambulatory Visit (HOSPITAL_BASED_OUTPATIENT_CLINIC_OR_DEPARTMENT_OTHER): Payer: Self-pay

## 2023-07-03 ENCOUNTER — Other Ambulatory Visit (HOSPITAL_BASED_OUTPATIENT_CLINIC_OR_DEPARTMENT_OTHER): Payer: Self-pay

## 2023-07-04 ENCOUNTER — Telehealth (HOSPITAL_COMMUNITY): Payer: Self-pay | Admitting: *Deleted

## 2023-07-04 ENCOUNTER — Other Ambulatory Visit: Payer: Self-pay

## 2023-07-04 ENCOUNTER — Other Ambulatory Visit (HOSPITAL_BASED_OUTPATIENT_CLINIC_OR_DEPARTMENT_OTHER): Payer: Self-pay

## 2023-07-04 ENCOUNTER — Encounter: Payer: Self-pay | Admitting: Physician Assistant

## 2023-07-04 DIAGNOSIS — F902 Attention-deficit hyperactivity disorder, combined type: Secondary | ICD-10-CM

## 2023-07-04 MED ORDER — AMPHETAMINE-DEXTROAMPHET ER 20 MG PO CP24: 20.0000 mg | ORAL_CAPSULE | Freq: Two times a day (BID) | ORAL | 0 refills | Status: DC

## 2023-07-04 NOTE — Telephone Encounter (Signed)
Pt of Dr. Sheela Stack called requesting refill of the Adderall XR 20 mg qam and at bedtime please be sent to [facility] as the [facility] have this medication on backorder with no ETA. Script was sent to [facility] on 06/29/23 by Dr. Lolly Mustache. Pt next visit scheduled for 07/25/23. Please review.

## 2023-07-05 DIAGNOSIS — M542 Cervicalgia: Secondary | ICD-10-CM | POA: Diagnosis not present

## 2023-07-12 ENCOUNTER — Other Ambulatory Visit (HOSPITAL_BASED_OUTPATIENT_CLINIC_OR_DEPARTMENT_OTHER): Payer: Self-pay

## 2023-07-12 DIAGNOSIS — M542 Cervicalgia: Secondary | ICD-10-CM | POA: Diagnosis not present

## 2023-07-13 ENCOUNTER — Other Ambulatory Visit (HOSPITAL_COMMUNITY): Payer: Self-pay

## 2023-07-13 ENCOUNTER — Other Ambulatory Visit (HOSPITAL_BASED_OUTPATIENT_CLINIC_OR_DEPARTMENT_OTHER): Payer: Self-pay

## 2023-07-13 MED ORDER — BUTALBITAL-APAP-CAFFEINE 50-325-40 MG PO TABS
1.0000 | ORAL_TABLET | Freq: Four times a day (QID) | ORAL | 1 refills | Status: DC | PRN
  Filled 2023-07-13 – 2023-07-17 (×4): qty 120, 30d supply, fill #0
  Filled 2023-08-12 – 2023-08-15 (×4): qty 120, 30d supply, fill #1

## 2023-07-14 DIAGNOSIS — M542 Cervicalgia: Secondary | ICD-10-CM | POA: Diagnosis not present

## 2023-07-15 ENCOUNTER — Encounter (HOSPITAL_BASED_OUTPATIENT_CLINIC_OR_DEPARTMENT_OTHER): Payer: Self-pay | Admitting: Pharmacist

## 2023-07-15 ENCOUNTER — Other Ambulatory Visit (HOSPITAL_BASED_OUTPATIENT_CLINIC_OR_DEPARTMENT_OTHER): Payer: Self-pay

## 2023-07-16 ENCOUNTER — Other Ambulatory Visit (HOSPITAL_BASED_OUTPATIENT_CLINIC_OR_DEPARTMENT_OTHER): Payer: Self-pay

## 2023-07-17 ENCOUNTER — Other Ambulatory Visit (HOSPITAL_BASED_OUTPATIENT_CLINIC_OR_DEPARTMENT_OTHER): Payer: Self-pay

## 2023-07-17 ENCOUNTER — Other Ambulatory Visit: Payer: Self-pay

## 2023-07-19 DIAGNOSIS — G894 Chronic pain syndrome: Secondary | ICD-10-CM | POA: Diagnosis not present

## 2023-07-19 DIAGNOSIS — M62830 Muscle spasm of back: Secondary | ICD-10-CM | POA: Diagnosis not present

## 2023-07-19 DIAGNOSIS — R109 Unspecified abdominal pain: Secondary | ICD-10-CM | POA: Diagnosis not present

## 2023-07-19 DIAGNOSIS — M47818 Spondylosis without myelopathy or radiculopathy, sacral and sacrococcygeal region: Secondary | ICD-10-CM | POA: Diagnosis not present

## 2023-07-20 DIAGNOSIS — M542 Cervicalgia: Secondary | ICD-10-CM | POA: Diagnosis not present

## 2023-07-21 ENCOUNTER — Ambulatory Visit (INDEPENDENT_AMBULATORY_CARE_PROVIDER_SITE_OTHER): Payer: Commercial Managed Care - PPO | Admitting: Physician Assistant

## 2023-07-21 ENCOUNTER — Other Ambulatory Visit (HOSPITAL_BASED_OUTPATIENT_CLINIC_OR_DEPARTMENT_OTHER): Payer: Self-pay

## 2023-07-21 VITALS — BP 130/88 | HR 90 | Temp 97.7°F | Ht 70.0 in | Wt 174.4 lb

## 2023-07-21 DIAGNOSIS — R222 Localized swelling, mass and lump, trunk: Secondary | ICD-10-CM | POA: Insufficient documentation

## 2023-07-21 DIAGNOSIS — Z131 Encounter for screening for diabetes mellitus: Secondary | ICD-10-CM | POA: Diagnosis not present

## 2023-07-21 DIAGNOSIS — Z0000 Encounter for general adult medical examination without abnormal findings: Secondary | ICD-10-CM | POA: Insufficient documentation

## 2023-07-21 DIAGNOSIS — Z13220 Encounter for screening for lipoid disorders: Secondary | ICD-10-CM

## 2023-07-21 LAB — CBC WITH DIFFERENTIAL/PLATELET
Basophils Absolute: 0 10*3/uL (ref 0.0–0.1)
Basophils Relative: 0.4 % (ref 0.0–3.0)
Eosinophils Absolute: 0.3 10*3/uL (ref 0.0–0.7)
Eosinophils Relative: 6.4 % — ABNORMAL HIGH (ref 0.0–5.0)
HCT: 42.6 % (ref 39.0–52.0)
Hemoglobin: 14.4 g/dL (ref 13.0–17.0)
Lymphocytes Relative: 40.6 % (ref 12.0–46.0)
Lymphs Abs: 2.1 10*3/uL (ref 0.7–4.0)
MCHC: 33.9 g/dL (ref 30.0–36.0)
MCV: 87 fl (ref 78.0–100.0)
Monocytes Absolute: 0.5 10*3/uL (ref 0.1–1.0)
Monocytes Relative: 8.9 % (ref 3.0–12.0)
Neutro Abs: 2.2 10*3/uL (ref 1.4–7.7)
Neutrophils Relative %: 43.7 % (ref 43.0–77.0)
Platelets: 265 10*3/uL (ref 150.0–400.0)
RBC: 4.89 Mil/uL (ref 4.22–5.81)
RDW: 13.2 % (ref 11.5–15.5)
WBC: 5.1 10*3/uL (ref 4.0–10.5)

## 2023-07-21 LAB — COMPREHENSIVE METABOLIC PANEL
ALT: 25 U/L (ref 0–53)
AST: 30 U/L (ref 0–37)
Albumin: 4.2 g/dL (ref 3.5–5.2)
Alkaline Phosphatase: 62 U/L (ref 39–117)
BUN: 11 mg/dL (ref 6–23)
CO2: 33 mEq/L — ABNORMAL HIGH (ref 19–32)
Calcium: 9.4 mg/dL (ref 8.4–10.5)
Chloride: 102 mEq/L (ref 96–112)
Creatinine, Ser: 0.81 mg/dL (ref 0.40–1.50)
GFR: 98.07 mL/min (ref >60.00)
Glucose, Bld: 85 mg/dL (ref 70–99)
Potassium: 4.8 mEq/L (ref 3.5–5.1)
Sodium: 141 mEq/L (ref 135–145)
Total Bilirubin: 0.3 mg/dL (ref 0.2–1.2)
Total Protein: 6.2 g/dL (ref 6.0–8.3)

## 2023-07-21 LAB — HEMOGLOBIN A1C: Hgb A1c MFr Bld: 5.6 % (ref 4.6–6.5)

## 2023-07-21 LAB — LIPID PANEL
Cholesterol: 160 mg/dL (ref 0–200)
HDL: 56.9 mg/dL
LDL Cholesterol: 79 mg/dL (ref 0–99)
NonHDL: 103.17
Total CHOL/HDL Ratio: 3
Triglycerides: 121 mg/dL (ref 0.0–149.0)
VLDL: 24.2 mg/dL (ref 0.0–40.0)

## 2023-07-21 LAB — PSA: PSA: 0.54 ng/mL (ref 0.10–4.00)

## 2023-07-21 LAB — TSH: TSH: 1.31 u[IU]/mL (ref 0.35–5.50)

## 2023-07-21 NOTE — Patient Instructions (Signed)
Labs today  Keep up good work!!   Call if any concerns

## 2023-07-21 NOTE — Progress Notes (Unsigned)
Subjective:    Patient ID: Jeremiah Anderson, male    DOB: [birth year], 57 y.o.   MRN: [id number]  Chief Complaint  Patient presents with   Annual Exam    No other concerns. Did mention being off for 5 months because of surgery, and had COVID 06/24/23. Wants to hold off Flu shot because he doesn't want to get sick since he is just now getting back to work.    HPI Patient is in today for annual exam. Recently getting over COVID-19 - tested positive on 06/24/23.   Acute concerns: Continues to have aches and pains - R lower leg lateral / ankle some nerve pain, wakes him up at night; ?lipoma left lower back - working with ortho to define this mass   Health maintenance: Lifestyle/ exercise: Currently going through PT since recovering from neck surgery  Nutrition: good food overall  Mental health: physical ailments weighing on him  Sleep: interrupted  Substance use: none Sexual activity: monogamous  Immunizations: UTD - will have flu through work  Colonoscopy: UTD - next due 2028    Past Medical History:  Diagnosis Date   Allergy    Arthritis    Blood transfusion without reported diagnosis    around age 75 after major nausea secondary to prescribed MS Contin   GERD (gastroesophageal reflux disease)    Hyperlipidemia    Hypertension    Migraines    Wears glasses     Past Surgical History:  Procedure Laterality Date   APPENDECTOMY  1980s   post op bleeding sent him back for ex lap and hemostatic intervention.    BACK SURGERY     8 surgeries total - fused from L1-S1   COLONOSCOPY     Age 54. For bleeding   ESOPHAGOGASTRODUODENOSCOPY N/A 06/10/2015   Procedure: ESOPHAGOGASTRODUODENOSCOPY (EGD);  Surgeon: Hart Carwin, MD;  Location: [facility] ENDOSCOPY;  Service: Endoscopy;  Laterality: N/A;   ex lap with peri gastric vessel ligation  1990s   after retching, he rupture a blood vessel "outside" the stomach.  vessel was ligated per pt's descriiption.    INCISIONAL HERNIA REPAIR Right  10/21/2016   Procedure: OPEN FLANK HERNIA REPAIR WITH MESH;  Surgeon: Axel Filler, MD;  Location: [facility] OR;  Service: General;  Laterality: Right;   INSERTION OF MESH Right 10/21/2016   Procedure: INSERTION OF MESH;  Surgeon: Axel Filler, MD;  Location: [facility] OR;  Service: General;  Laterality: Right;   KNEE SURGERY     Multiple bilateral scopes   RETINAL DETACHMENT SURGERY Right    SHOULDER SURGERY     4x on the left - total shoulder replacement.    SHOULDER SURGERY Left    x2   TOTAL SHOULDER ARTHROPLASTY Left    x2    Family History  Problem Relation Age of Onset   Arthritis Mother    Hyperlipidemia Mother    Hypertension Father    Cancer Father    Skin cancer Father    Colon cancer Maternal Grandmother    Cancer Maternal Grandmother    Hypertension Sister    Hypertension Paternal Grandmother    Heart attack Paternal Grandfather    Heart disease Paternal Grandfather    Diabetes Neg Hx    Stroke Neg Hx    Prostate cancer Neg Hx     Social History   Tobacco Use   Smoking status: Former    Types: Cigarettes   Smokeless tobacco: Never  Vaping Use   Vaping status: Never  Used  Substance Use Topics   Alcohol use: Not Currently    Alcohol/week: 0.0 standard drinks of alcohol   Drug use: No     Allergies  Allergen Reactions   Fentanyl Other (See Comments)    Hallucinations (with the patch)  Other Reaction(s): Other (See Comments)  Hears voice   Fentanyl Patch   Lisinopril Swelling   Lyrica [Pregabalin]     hallucinations   Metoprolol Other (See Comments)   Sulfa Antibiotics Swelling    Review of Systems NEGATIVE UNLESS OTHERWISE INDICATED IN HPI      Objective:     BP 130/88 (BP Location: Left Arm, Patient Position: Sitting, Cuff Size: Normal)   Pulse 90   Temp 97.7 F (36.5 C) (Temporal)   Ht 5\' 10"  (1.778 m)   Wt 174 lb 6.4 oz (79.1 kg)   SpO2 98%   BMI 25.02 kg/m   Wt Readings from Last 3 Encounters:  07/21/23 174 lb 6.4 oz (79.1 kg)   04/25/23 172 lb (78 kg)  03/15/23 174 lb 6.4 oz (79.1 kg)    BP Readings from Last 3 Encounters:  07/21/23 130/88  03/15/23 116/70  12/22/22 138/88     Physical Exam Vitals and nursing note reviewed.  Constitutional:      General: He is not in acute distress.    Appearance: Normal appearance. He is not toxic-appearing.  HENT:     Head: Normocephalic and atraumatic.     Right Ear: Tympanic membrane, ear canal and external ear normal.     Left Ear: Tympanic membrane, ear canal and external ear normal.     Nose: Nose normal.     Mouth/Throat:     Mouth: Mucous membranes are moist.     Pharynx: Oropharynx is clear.  Eyes:     Extraocular Movements: Extraocular movements intact.     Conjunctiva/sclera: Conjunctivae normal.     Pupils: Pupils are equal, round, and reactive to light.  Cardiovascular:     Rate and Rhythm: Normal rate and regular rhythm.     Pulses: Normal pulses.     Heart sounds: Normal heart sounds.  Pulmonary:     Effort: Pulmonary effort is normal.     Breath sounds: Normal breath sounds.  Abdominal:     General: Abdomen is flat. Bowel sounds are normal.     Palpations: Abdomen is soft.     Tenderness: There is no abdominal tenderness.  Musculoskeletal:     Cervical back: Normal range of motion and neck supple.     Comments: Straight spine, loss of normal curvature   Skin:    General: Skin is warm and dry.     Findings: Lesion (left lower back subcutaneous oval mass approx golf ball sized) present. No rash.  Neurological:     General: No focal deficit present.     Mental Status: He is alert and oriented to person, place, and time.  Psychiatric:        Mood and Affect: Mood normal.        Behavior: Behavior normal.        Assessment & Plan:  Encounter for annual physical exam -     Lipid panel -     PSA -     TSH -     Comprehensive metabolic panel -     CBC with Differential/Platelet -     Hemoglobin A1c  Palpable mass of lower  back   Age-appropriate screening and counseling performed today. Will  check labs and call with results. Preventive measures discussed and printed in AVS for patient.   Patient Counseling: [x]   Nutrition: Stressed importance of moderation in sodium/caffeine intake, saturated fat and cholesterol, caloric balance, sufficient intake of fresh fruits, vegetables, and fiber.  [x]   Stressed the importance of regular exercise.   []   Substance Abuse: Discussed cessation/primary prevention of tobacco, alcohol, or other drug use; driving or other dangerous activities under the influence; availability of treatment for abuse.   []   Injury prevention: Discussed safety belts, safety helmets, smoke detector, smoking near bedding or upholstery.   []   Sexuality: Discussed sexually transmitted diseases, partner selection, use of condoms, avoidance of unintended pregnancy  and contraceptive alternatives.   [x]   Dental health: Discussed importance of regular tooth brushing, flossing, and dental visits.  [x]   Health maintenance and immunizations reviewed. Please refer to Health maintenance section.         Return in about 1 year (around 07/20/2024) for physical.    Darean Rote M Khadija Thier, PA-C

## 2023-07-24 NOTE — Assessment & Plan Note (Signed)
Pt reports several imaging studies completed; following with ortho

## 2023-07-25 ENCOUNTER — Ambulatory Visit (HOSPITAL_COMMUNITY): Payer: Commercial Managed Care - PPO | Admitting: Psychiatry

## 2023-07-27 ENCOUNTER — Encounter (HOSPITAL_COMMUNITY): Payer: Self-pay | Admitting: Psychiatry

## 2023-07-27 ENCOUNTER — Ambulatory Visit (HOSPITAL_BASED_OUTPATIENT_CLINIC_OR_DEPARTMENT_OTHER): Payer: Commercial Managed Care - PPO | Admitting: Psychiatry

## 2023-07-27 DIAGNOSIS — F902 Attention-deficit hyperactivity disorder, combined type: Secondary | ICD-10-CM

## 2023-07-27 DIAGNOSIS — M542 Cervicalgia: Secondary | ICD-10-CM | POA: Diagnosis not present

## 2023-07-27 MED ORDER — AMPHETAMINE-DEXTROAMPHET ER 20 MG PO CP24
20.0000 mg | ORAL_CAPSULE | Freq: Two times a day (BID) | ORAL | 0 refills | Status: DC
Start: 2023-07-27 — End: 2023-07-31

## 2023-07-27 NOTE — Progress Notes (Signed)
BH MD/PA/NP OP Progress Note   Patient location; office Provider location; office  07/27/2023 3:20 PM Jeremiah Anderson  MRN:  [id number]  Chief Complaint:  Chief Complaint  Patient presents with   Follow-up   HPI:  Patient came in for his follow-up appointment.  He started working after 4-1/2 months postsurgery.  In the beginning he had some difficulty adjusting but now slowly and gradually he is making progress.  He had a spinal fusion.  He is no longer taking Mobic because it was contraindicated after fusion.  He reported continued to feel sometimes jittery, fidgety and restless due to his chronic pain and he also believed hurting all over the body.  He is doing physical therapy.  He has neck pain, back pain.  He is going to follow-up [facility] to look at his back.  He feels sometime coworker get very anxious when he gets very fidgety and restless but he denies any aggression, violence, irritability.  He feels her Adderall helping him a lot.  He was able to pass the BLS and all other assignment.  He likes his job and he understand he need to continue to work to pay the bills.  His relationship is going well.  He is able to complete his task on time.  He denies drinking or using any illegal substances.  He is taking morphine and pain medicine.  He has irritability, paranoia, suicidal thoughts or homicidal thoughts.  He is working night shift.  His attention concentration is good.  His appetite is okay and his weight is stable.  Recently had a physical and labs are drawn which were normal.  Visit Diagnosis:    ICD-10-CM   1. Attention deficit hyperactivity disorder (ADHD), combined type  F90.2 amphetamine-dextroamphetamine (ADDERALL XR) 20 MG 24 hr capsule        Past Psychiatric History: Reviewed. H/O ADHD diagnosed in 2005.  Took Adderall until 2013 but stopped after taking pain medication. 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  H/O suicidal  thoughts when going through withdrawals but no attempts. No H/O mania.  Had tried Adderall for a while and then recently Vyvanse but had GI side effects.  Strattera did not help.  PCP tried trazodone for sleep S/P brain surgery. H/O alcohol on and off but no current use. No history of cocaine or any IV drugs.   Past Medical History:  Past Medical History:  Diagnosis Date   Allergy    Arthritis    Blood transfusion without reported diagnosis    around age 38 after major nausea secondary to prescribed MS Contin   GERD (gastroesophageal reflux disease)    Hyperlipidemia    Hypertension    Migraines    Wears glasses     Past Surgical History:  Procedure Laterality Date   APPENDECTOMY  1980s   post op bleeding sent him back for ex lap and hemostatic intervention.    BACK SURGERY     8 surgeries total - fused from L1-S1   COLONOSCOPY     Age 30. For bleeding   ESOPHAGOGASTRODUODENOSCOPY N/A 06/10/2015   Procedure: ESOPHAGOGASTRODUODENOSCOPY (EGD);  Surgeon: Hart Carwin, MD;  Location: [facility] ENDOSCOPY;  Service: Endoscopy;  Laterality: N/A;   ex lap with peri gastric vessel ligation  1990s   after retching, he rupture a blood vessel "outside" the stomach.  vessel was ligated per pt's descriiption.    INCISIONAL HERNIA REPAIR Right 10/21/2016   Procedure: OPEN FLANK  HERNIA REPAIR WITH MESH;  Surgeon: Axel Filler, MD;  Location: [facility] OR;  Service: General;  Laterality: Right;   INSERTION OF MESH Right 10/21/2016   Procedure: INSERTION OF MESH;  Surgeon: Axel Filler, MD;  Location: [facility] OR;  Service: General;  Laterality: Right;   KNEE SURGERY     Multiple bilateral scopes   RETINAL DETACHMENT SURGERY Right    SHOULDER SURGERY     4x on the left - total shoulder replacement.    SHOULDER SURGERY Left    x2   TOTAL SHOULDER ARTHROPLASTY Left    x2    Family Psychiatric History: Reviewed.  Family History:  Family History  Problem Relation Age of Onset   Arthritis Mother     Hyperlipidemia Mother    Hypertension Father    Cancer Father    Skin cancer Father    Colon cancer Maternal Grandmother    Cancer Maternal Grandmother    Hypertension Sister    Hypertension Paternal Grandmother    Heart attack Paternal Grandfather    Heart disease Paternal Grandfather    Diabetes Neg Hx    Stroke Neg Hx    Prostate cancer Neg Hx     Social History:  Social History   Socioeconomic History   Marital status: Single    Spouse name: Not on file   Number of children: 0   Years of education: 16   Highest education level: Bachelor's degree (e.g., BA, AB, BS)  Occupational History   Occupation: Disability    Comment: Former Charity fundraiser  Tobacco Use   Smoking status: Former    Types: Cigarettes   Smokeless tobacco: Never  Vaping Use   Vaping status: Never Used  Substance and Sexual Activity   Alcohol use: Not Currently    Alcohol/week: 0.0 standard drinks of alcohol   Drug use: No   Sexual activity: Not on file  Other Topics Concern   Not on file  Social History Narrative   Fun: play guitar / limited funds      Denies any religious beliefs effecting health care.    Social Determinants of Health   Financial Resource Strain: Low Risk  (04/24/2023)   Overall Financial Resource Strain (CARDIA)    Difficulty of Paying Living Expenses: Not very hard  Food Insecurity: No Food Insecurity (04/24/2023)   Hunger Vital Sign    Worried About Running Out of Food in the Last Year: Never true    Ran Out of Food in the Last Year: Never true  Transportation Needs: No Transportation Needs (04/24/2023)   PRAPARE - Administrator, Civil Service (Medical): No    Lack of Transportation (Non-Medical): No  Physical Activity: Insufficiently Active (04/24/2023)   Exercise Vital Sign    Days of Exercise per Week: 3 days    Minutes of Exercise per Session: 30 min  Stress: Stress Concern Present (04/24/2023)   [employer] of Occupational Health - Occupational Stress  Questionnaire    Feeling of Stress : To some extent  Social Connections: Unknown (04/24/2023)   Social Connection and Isolation Panel [NHANES]    Frequency of Communication with Friends and Family: Once a week    Frequency of Social Gatherings with Friends and Family: Once a week    Attends Religious Services: Patient declined    Database administrator or Organizations: No    Attends Banker Meetings: Never    Marital Status: Living with partner    Allergies:  Allergies  Allergen Reactions   Fentanyl Other (See Comments)    Hallucinations (with the patch)  Other Reaction(s): Other (See Comments)  Hears voice   Fentanyl Patch   Lisinopril Swelling   Lyrica [Pregabalin]     hallucinations   Metoprolol Other (See Comments)   Sulfa Antibiotics Swelling    Metabolic Disorder Labs: Lab Results  Component Value Date   HGBA1C 5.6 07/21/2023   No results found for: "PROLACTIN" Lab Results  Component Value Date   CHOL 160 07/21/2023   TRIG 121.0 07/21/2023   HDL 56.90 07/21/2023   CHOLHDL 3 07/21/2023   VLDL 24.2 07/21/2023   LDLCALC 79 07/21/2023   LDLCALC 80 04/11/2022   Lab Results  Component Value Date   TSH 1.31 07/21/2023   TSH 0.93 11/04/2021    Therapeutic Level Labs: No results found for: "LITHIUM" No results found for: "VALPROATE" No results found for: "CBMZ"  Current Medications: Current Outpatient Medications  Medication Sig Dispense Refill   amLODipine (NORVASC) 10 MG tablet Take 1 tablet (10 mg total) by mouth daily. 90 tablet 1   amphetamine-dextroamphetamine (ADDERALL XR) 20 MG 24 hr capsule Take 1 capsule (20 mg total) by mouth in the morning and at bedtime. 60 capsule 0   butalbital-acetaminophen-caffeine (BAC) 50-325-40 MG tablet Take 1 tablet by mouth every 6 (six) hours as needed. 120 tablet 1   Cholecalciferol (VITAMIN D3) 25 MCG (1000 UT) CAPS Take by mouth daily.     Coenzyme Q10 (COQ10) 100 MG CAPS Take 100 mg by mouth daily.      cyanocobalamin 100 MCG tablet Take 100 mcg by mouth daily.     fluticasone (FLONASE) 50 MCG/ACT nasal spray Place 2 sprays into both nostrils daily. 16 g 11   Lidocaine 4 % PTCH Place 1-2 patches onto the skin 2 (two) times daily as needed (for pain.). Salonpas Maximum Strength Pain Relieving Gel-Patch     meloxicam (MOBIC) 15 MG tablet Take 1 tablet (15 mg total) by mouth daily as needed. 30 tablet 2   morphine (MSIR) 30 MG tablet Take 30 mg by mouth every 4 (four) hours as needed.     Multiple Vitamin (MULTIVITAMIN WITH MINERALS) TABS tablet Take 1 tablet by mouth daily.     naloxone (NARCAN) nasal spray 4 mg/0.1 mL Place into the nose.     Omega-3 Fatty Acids (OMEGA 3 PO) Take 120 mg by mouth daily.     ondansetron (ZOFRAN) 4 MG tablet TAKE 1 TABLET BY MOUTH EVERY 8 HOURS AS NEEDED FOR NAUSEA AND VOMITING 30 tablet 3   rosuvastatin (CRESTOR) 20 MG tablet Take 1 tablet (20 mg total) by mouth daily. 90 tablet 1   SUMAtriptan (IMITREX) 100 MG tablet MAY REPEAT IN 2 HOURS IF HEADACHE PERSISTS OR RECURS. 9 tablet 3   tazarotene (TAZORAC) 0.1 % gel APPLY A SMALL AMOUNT TO AFFECTED AREA AT BEDTIME     testosterone cypionate (DEPOTESTOSTERONE CYPIONATE) 200 MG/ML injection Inject 0.5 mLs (100 mg total) into the muscle once a week. 10 mL 1   No current facility-administered medications for this visit.     Musculoskeletal: Strength & Muscle Tone: within normal limits Gait & Station: normal Patient leans: N/A Psychiatric Specialty Exam: Physical Exam  Review of Systems  Musculoskeletal:  Positive for back pain and neck pain.       Chronic pain    Blood pressure 122/74, height 5\' 10"  (1.778 m), weight 178 lb (80.7 kg).Body mass index is 25.54 kg/m.  General Appearance:  Casual  Eye Contact:  Good  Speech:  Normal Rate  Volume:  Normal  Mood:  Anxious  Affect:  Appropriate  Thought Process:  Goal Directed  Orientation:  Full (Time, Place, and Person)  Thought Content:  Logical   Suicidal Thoughts:  No  Homicidal Thoughts:  No  Memory:  Immediate;   Good Recent;   Good Remote;   Fair  Judgement:  Intact  Insight:  Present  Psychomotor Activity:  Restlessness and fidgety   Concentration:  Concentration: Fair and Attention Span: Fair  Recall:  Good  Fund of Knowledge:  Good  Language:  Good  Akathisia:  No  Handed:  Right  AIMS (if indicated):     Assets:  Communication Skills Desire for Improvement Housing Resilience Social Support Talents/Skills Transportation  ADL's:  Intact  Cognition:  WNL  Sleep:        Screenings: GAD-7    Flowsheet Row Office Visit from 07/21/2023 in [facility] from 12/22/2022 in [facility]  Total GAD-7 Score 1 2      PHQ2-9    Flowsheet Row Office Visit from 07/21/2023 in [facility] Clinical Support from 04/25/2023 in [facility] from 12/22/2022 in [facility] from 12/15/2022 in [facility] Office Visit from 04/11/2022 in [facility]  PHQ-2 Total Score 2 0 1 1 0  PHQ-9 Total Score 5 -- 3 5 --      Flowsheet Row Office Visit from 12/15/2022 in [facility]  C-SSRS RISK CATEGORY Error: Question 6 not populated        Assessment and Plan: ADHD, combined type  Patient is stable on current medication.  After 4-1/2 months he is back to work.  He does not want to change the medication since it is working well.  Continue Adderall XR 20 mg at 4:00 and at 8 PM before he go to work.  His blood pressure is under control.  He has chronic pain and going to see the [facility] to address his back pain.  Discussed medication side effects and benefits.  Discussed stimulant abuse, dependency, tolerance and withdrawal.  I also reviewed blood work results.  Hemoglobin A1c normal.   Follow-up in 3 months.  He is having some issues with the pharmacy and he [facility].   Collaboration of Care: Collaboration of Care: Other provider involved in patient's care AEB notes are available in epic to review.  Patient/Guardian was advised Release of Information must be obtained prior to any record release in order to collaborate their care with an outside provider. Patient/Guardian was advised if they have not already done so to contact the registration department to sign all necessary forms in order for [place] to release information regarding their care.   Consent: Patient/Guardian gives verbal consent for treatment and assignment of benefits for services provided during this visit. Patient/Guardian expressed understanding and agreed to proceed.    Cleotis Nipper, MD 07/27/2023, 3:20 PM

## 2023-07-28 ENCOUNTER — Telehealth (HOSPITAL_COMMUNITY): Payer: Self-pay | Admitting: *Deleted

## 2023-07-28 DIAGNOSIS — F902 Attention-deficit hyperactivity disorder, combined type: Secondary | ICD-10-CM

## 2023-07-28 NOTE — Telephone Encounter (Signed)
Pt called requesting that the script for Adderall XR 20 mg be sent to [facility] due to [facility] not having the medication in stock and it is currently on backorder. Pt seen yesterday.

## 2023-07-31 ENCOUNTER — Other Ambulatory Visit (HOSPITAL_BASED_OUTPATIENT_CLINIC_OR_DEPARTMENT_OTHER): Payer: Self-pay

## 2023-07-31 ENCOUNTER — Other Ambulatory Visit: Payer: Self-pay

## 2023-07-31 DIAGNOSIS — M542 Cervicalgia: Secondary | ICD-10-CM | POA: Diagnosis not present

## 2023-07-31 MED ORDER — AMPHETAMINE-DEXTROAMPHET ER 20 MG PO CP24
20.0000 mg | ORAL_CAPSULE | Freq: Two times a day (BID) | ORAL | 0 refills | Status: DC
Start: 2023-07-31 — End: 2023-09-01
  Filled 2023-07-31 – 2023-08-01 (×3): qty 60, 30d supply, fill #0
  Filled 2023-08-03: qty 5, 3d supply, fill #0
  Filled 2023-08-07: qty 5, 3d supply, fill #1
  Filled 2023-08-08: qty 55, 28d supply, fill #1
  Filled ????-??-??: fill #0

## 2023-07-31 NOTE — Telephone Encounter (Signed)
Send prescription to Med center at [facility].

## 2023-08-01 ENCOUNTER — Other Ambulatory Visit (HOSPITAL_BASED_OUTPATIENT_CLINIC_OR_DEPARTMENT_OTHER): Payer: Self-pay

## 2023-08-01 ENCOUNTER — Encounter (HOSPITAL_BASED_OUTPATIENT_CLINIC_OR_DEPARTMENT_OTHER): Payer: Self-pay

## 2023-08-01 ENCOUNTER — Other Ambulatory Visit: Payer: Self-pay | Admitting: Physician Assistant

## 2023-08-02 NOTE — Telephone Encounter (Signed)
Last OV 07/21/2023  Next OV 05/07/2024  Last filled 02/27/2023  Quantity 30 w/3 refills   Please advise if ok to Refill

## 2023-08-03 ENCOUNTER — Other Ambulatory Visit (HOSPITAL_BASED_OUTPATIENT_CLINIC_OR_DEPARTMENT_OTHER): Payer: Self-pay

## 2023-08-07 ENCOUNTER — Other Ambulatory Visit: Payer: Self-pay

## 2023-08-07 ENCOUNTER — Other Ambulatory Visit (HOSPITAL_BASED_OUTPATIENT_CLINIC_OR_DEPARTMENT_OTHER): Payer: Self-pay

## 2023-08-07 ENCOUNTER — Other Ambulatory Visit (HOSPITAL_COMMUNITY): Payer: Self-pay

## 2023-08-07 DIAGNOSIS — M542 Cervicalgia: Secondary | ICD-10-CM | POA: Diagnosis not present

## 2023-08-08 ENCOUNTER — Other Ambulatory Visit (HOSPITAL_BASED_OUTPATIENT_CLINIC_OR_DEPARTMENT_OTHER): Payer: Self-pay

## 2023-08-11 ENCOUNTER — Other Ambulatory Visit (INDEPENDENT_AMBULATORY_CARE_PROVIDER_SITE_OTHER): Payer: Commercial Managed Care - PPO

## 2023-08-11 ENCOUNTER — Other Ambulatory Visit: Payer: Self-pay

## 2023-08-11 ENCOUNTER — Ambulatory Visit (INDEPENDENT_AMBULATORY_CARE_PROVIDER_SITE_OTHER): Payer: Commercial Managed Care - PPO | Admitting: Orthopaedic Surgery

## 2023-08-11 DIAGNOSIS — M25552 Pain in left hip: Secondary | ICD-10-CM | POA: Diagnosis not present

## 2023-08-11 DIAGNOSIS — M25551 Pain in right hip: Secondary | ICD-10-CM

## 2023-08-11 DIAGNOSIS — M545 Low back pain, unspecified: Secondary | ICD-10-CM

## 2023-08-11 DIAGNOSIS — G8929 Other chronic pain: Secondary | ICD-10-CM | POA: Diagnosis not present

## 2023-08-13 ENCOUNTER — Other Ambulatory Visit (HOSPITAL_BASED_OUTPATIENT_CLINIC_OR_DEPARTMENT_OTHER): Payer: Self-pay

## 2023-08-14 ENCOUNTER — Other Ambulatory Visit (HOSPITAL_BASED_OUTPATIENT_CLINIC_OR_DEPARTMENT_OTHER): Payer: Self-pay

## 2023-08-15 ENCOUNTER — Other Ambulatory Visit (HOSPITAL_BASED_OUTPATIENT_CLINIC_OR_DEPARTMENT_OTHER): Payer: Self-pay

## 2023-08-15 NOTE — Progress Notes (Signed)
Office Visit Note   Patient: Jeremiah Anderson           Date of Birth: [birth year]           MRN: [id number] Visit Date: 08/11/2023              Requested by: Allwardt, Jeremiah Infante, PA-C [address] [place],  [place] [address] PCP: [name], PA-C   Assessment & Plan: Visit Diagnoses:  1. Chronic low back pain, unspecified back pain laterality, unspecified whether sciatica present   2. Pain of left hip   3. Pain in right hip     Plan: Patient is continuing to work.  He can follow-up on an as-needed basis.  He has had recent fusion cervical spine at [facility].  Follow-Up Instructions: No follow-ups on file.   Orders:  Orders Placed This Encounter  Procedures   XR Lumbar Spine 2-3 Views   XR HIPS BILAT W OR W/O PELVIS 3-4 VIEWS   No orders of the defined types were placed in this encounter.     Procedures: No procedures performed   Clinical Data: No additional findings.   Subjective: Chief Complaint  Patient presents with   Right Hip - Pain   Left Hip - Pain   Lower Back - Pain    HPI 57 year old male followed by Dr. 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  He has some cervical spondylosis which has been fused elsewhere.  He notes decreased rotation of his neck.  Past history of meningioma.  He has some aching that occurs in his calf at times.  Previously had some partial foot drop which is improved he is now wearing a brace.  States last week he had some problems turning his neck doing better this week.  Patient 3 level a fewPatient was done by Dr. [name] at [facility].   Review of Systems all systems noncontributory.   Objective: Vital Signs: There were no vitals taken for this visit.  Physical Exam Constitutional:      Appearance: He is well-developed.  HENT:     Head: Normocephalic and atraumatic.     Right Ear: External ear normal.     Left Ear: External ear normal.  Eyes:     Pupils: Pupils are equal, round,  and reactive to light.  Neck:     Thyroid: No thyromegaly.     Trachea: No tracheal deviation.  Cardiovascular:     Rate and Rhythm: Normal rate.  Pulmonary:     Effort: Pulmonary effort is normal.     Breath sounds: No wheezing.  Abdominal:     General: Bowel sounds are normal.     Palpations: Abdomen is soft.  Musculoskeletal:     Cervical back: Neck supple.  Skin:    General: Skin is warm and dry.     Capillary Refill: Capillary refill takes less than 2 seconds.  Neurological:     Mental Status: He is alert and oriented to person, place, and time.  Psychiatric:        Behavior: Behavior normal.        Thought Content: Thought content normal.        Judgment: Judgment normal.     Ortho Exam patient ambulates without limp.  decreased cervical range of motion he is able to heel and toe walk.  Grip strength is good.  Healed cervical incision.  Specialty Comments:  No specialty comments available.  Imaging: AP lateral  lumbar spine demonstrate fusion L1-S1.  Bottom 2 levels have no rods but pedicle screw still present.  No evidence of subsidence or loosening of hardware and fusion appears solid.  Mild endplate spurring and narrowing at T12-L1.  Impression: Fusion L1-S1.  Mild adjacent level progression.  Narrative & Impression  AP pelvis including hips demonstrates mild lateral uncoverage of the head with mild joint space narrowing minimal osteophyte formation.   Impression: Mild hip osteoarthritis bilateral.  Pedicle screws noted at L4 and L5 without rod.   PMFS History: Patient Active Problem List   Diagnosis Date Noted   Palpable mass of lower back 07/21/2023   Encounter for annual physical exam 07/21/2023   Spinal stenosis, cervical region 03/15/2023   Chronic left shoulder pain 12/22/2022   Meningioma (HCC) 10/07/2022   Seizure (HCC) 10/07/2022   Testosterone deficiency 11/04/2020   Vitamin D deficiency 11/04/2020   Attention deficit hyperactivity disorder  (ADHD), combined type 11/04/2020   S/P foot surgery, right 10/20/2020   Morton's neuroma of right foot 08/05/2020   Elevated LFTs 03/23/2020   Fusion of spine of lumbar region 10/31/2019   Spondylolysis of cervical region 10/31/2019   Chronic, continuous use of opioids 07/17/2019   Facet arthropathy, cervical 07/17/2019   Neck pain 12/17/2017   Patellar tendinitis of right knee 08/18/2017   Medicare annual wellness visit, subsequent 01/27/2017   Abscess of nasal cavity 06/14/2016   Right flank mass 06/14/2016   Tobacco use disorder 06/14/2016   Overweight (BMI 25.0-29.9) 06/14/2016   Sinusitis, chronic 08/12/2015   Arthralgia 07/06/2015   Chronic back pain 06/10/2015   Multiple duodenal ulcers 06/10/2015   Hyperlipidemia 05/07/2015   Migraines 04/21/2015   Essential hypertension 02/16/2015   Impingement syndrome of shoulder 08/07/2014   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   Past Medical History:  Diagnosis Date   Allergy    Arthritis    Blood transfusion without reported diagnosis    around age 28 after major nausea secondary to prescribed MS Contin   GERD (gastroesophageal reflux disease)    Hyperlipidemia    Hypertension    Migraines    Wears glasses     Family History  Problem Relation Age of Onset   Arthritis Mother    Hyperlipidemia Mother    Hypertension Father    Cancer Father    Skin cancer Father    Colon cancer Maternal Grandmother    Cancer Maternal Grandmother    Hypertension Sister    Hypertension Paternal Grandmother    Heart attack Paternal Grandfather    Heart disease Paternal Grandfather    Diabetes Neg Hx    Stroke Neg Hx    Prostate cancer Neg Hx     Past Surgical History:  Procedure Laterality Date   APPENDECTOMY  1980s   post op bleeding sent him back for ex lap and hemostatic intervention.    BACK SURGERY     8 surgeries total - fused from L1-S1   COLONOSCOPY     Age 87. For bleeding   ESOPHAGOGASTRODUODENOSCOPY N/A 06/10/2015    Procedure: ESOPHAGOGASTRODUODENOSCOPY (EGD);  Surgeon: Hart Carwin, MD;  Location: [facility] ENDOSCOPY;  Service: Endoscopy;  Laterality: N/A;   ex lap with peri gastric vessel ligation  1990s   after retching, he rupture a blood vessel "outside" the stomach.  vessel was ligated per pt's descriiption.    INCISIONAL HERNIA REPAIR Right 10/21/2016   Procedure: OPEN FLANK HERNIA REPAIR WITH MESH;  Surgeon: Axel Filler, MD;  Location: [facility] OR;  Service: General;  Laterality: Right;   INSERTION OF MESH Right 10/21/2016   Procedure: INSERTION OF MESH;  Surgeon: Axel Filler, MD;  Location: [facility] OR;  Service: General;  Laterality: Right;   KNEE SURGERY     Multiple bilateral scopes   RETINAL DETACHMENT SURGERY Right    SHOULDER SURGERY     4x on the left - total shoulder replacement.    SHOULDER SURGERY Left    x2   TOTAL SHOULDER ARTHROPLASTY Left    x2   Social History   Occupational History   Occupation: Disability    Comment: Former Charity fundraiser  Tobacco Use   Smoking status: Former    Types: Cigarettes   Smokeless tobacco: Never  Vaping Use   Vaping status: Never Used  Substance and Sexual Activity   Alcohol use: Not Currently    Alcohol/week: 0.0 standard drinks of alcohol   Drug use: No   Sexual activity: Not on file

## 2023-08-16 ENCOUNTER — Other Ambulatory Visit (HOSPITAL_BASED_OUTPATIENT_CLINIC_OR_DEPARTMENT_OTHER): Payer: Self-pay

## 2023-08-16 DIAGNOSIS — M542 Cervicalgia: Secondary | ICD-10-CM | POA: Diagnosis not present

## 2023-08-17 DIAGNOSIS — R109 Unspecified abdominal pain: Secondary | ICD-10-CM | POA: Diagnosis not present

## 2023-08-17 DIAGNOSIS — M47818 Spondylosis without myelopathy or radiculopathy, sacral and sacrococcygeal region: Secondary | ICD-10-CM | POA: Diagnosis not present

## 2023-08-17 DIAGNOSIS — G894 Chronic pain syndrome: Secondary | ICD-10-CM | POA: Diagnosis not present

## 2023-08-17 DIAGNOSIS — Z79891 Long term (current) use of opiate analgesic: Secondary | ICD-10-CM | POA: Diagnosis not present

## 2023-08-17 DIAGNOSIS — M62830 Muscle spasm of back: Secondary | ICD-10-CM | POA: Diagnosis not present

## 2023-08-22 DIAGNOSIS — M546 Pain in thoracic spine: Secondary | ICD-10-CM | POA: Diagnosis not present

## 2023-08-22 DIAGNOSIS — M545 Low back pain, unspecified: Secondary | ICD-10-CM | POA: Diagnosis not present

## 2023-08-23 DIAGNOSIS — M542 Cervicalgia: Secondary | ICD-10-CM | POA: Diagnosis not present

## 2023-08-24 ENCOUNTER — Ambulatory Visit (HOSPITAL_COMMUNITY)
Admission: RE | Admit: 2023-08-24 | Discharge: 2023-08-24 | Disposition: A | Discharge status: Home / Self Care | Payer: Commercial Managed Care - PPO | Source: Ambulatory Visit | Attending: Physician Assistant | Admitting: Physician Assistant

## 2023-08-24 ENCOUNTER — Encounter: Payer: Self-pay | Admitting: Physician Assistant

## 2023-08-24 ENCOUNTER — Ambulatory Visit (INDEPENDENT_AMBULATORY_CARE_PROVIDER_SITE_OTHER): Payer: Commercial Managed Care - PPO | Admitting: Physician Assistant

## 2023-08-24 VITALS — Temp 97.1°F | Ht 70.0 in | Wt 172.8 lb

## 2023-08-24 DIAGNOSIS — D320 Benign neoplasm of cerebral meninges: Secondary | ICD-10-CM | POA: Diagnosis not present

## 2023-08-24 DIAGNOSIS — Z9889 Other specified postprocedural states: Secondary | ICD-10-CM | POA: Diagnosis not present

## 2023-08-24 DIAGNOSIS — R42 Dizziness and giddiness: Secondary | ICD-10-CM

## 2023-08-24 DIAGNOSIS — D329 Benign neoplasm of meninges, unspecified: Secondary | ICD-10-CM | POA: Diagnosis not present

## 2023-08-24 DIAGNOSIS — G9389 Other specified disorders of brain: Secondary | ICD-10-CM | POA: Diagnosis not present

## 2023-08-24 MED ORDER — IOHEXOL 350 MG/ML SOLN
75.0000 mL | Freq: Once | INTRAVENOUS | Status: AC | PRN
  Administered 2023-08-24: 75 mL via INTRAVENOUS

## 2023-08-24 NOTE — Progress Notes (Signed)
Subjective:    Patient ID: Jeremiah Anderson, male    DOB: 08/30/1966, 57 y.o.   MRN: [id number]  Chief Complaint  Patient presents with   Dizziness    Pt has experienced dizziness at work two times; thinks it may be blood pressure related or inner ear related. Not sleeping well and some stressful situations as well; needs a note ok to return to work. Pt denies SOB and or chest pains.     HPI Patient with hx of meningioma and s/p craniotomy is in today for concerns of dizziness. Not sleeping well, has been stressed and angry lately - dealing with some family stress, cousin with new cancer, sister is making him angry, mother is visiting in [place], etc.  Also had recent GI illness with fever and some diarrhea, had two days after this where he felt dizzy - thought maybe just dehydrated.  Then has had recurrent episodes.   Incident at work - bent down to low level and stood back up, felt like he might pass out. Feeling like bubbles in the head, same as when brain tumor was first discovered he says. Another time had to hold onto the railing while urinating.   Last episode was three days ago of "dizziness" at work. States even with sitting down sometimes feels this.  NO chest pain or SOB. No n/v/d. No vision changes.   Past Medical History:  Diagnosis Date   Allergy    Arthritis    Blood transfusion without reported diagnosis    around age 75 after major nausea secondary to prescribed MS Contin   GERD (gastroesophageal reflux disease)    Hyperlipidemia    Hypertension    Migraines    Wears glasses     Past Surgical History:  Procedure Laterality Date   APPENDECTOMY  1980s   post op bleeding sent him back for ex lap and hemostatic intervention.    BACK SURGERY     8 surgeries total - fused from L1-S1   COLONOSCOPY     Age 89. For bleeding   ESOPHAGOGASTRODUODENOSCOPY N/A 06/10/2015   Procedure: ESOPHAGOGASTRODUODENOSCOPY (EGD);  Surgeon: Hart Carwin, MD;  Location: [facility] ENDOSCOPY;   Service: Endoscopy;  Laterality: N/A;   ex lap with peri gastric vessel ligation  1990s   after retching, he rupture a blood vessel "outside" the stomach.  vessel was ligated per pt's descriiption.    INCISIONAL HERNIA REPAIR Right 10/21/2016   Procedure: OPEN FLANK HERNIA REPAIR WITH MESH;  Surgeon: Axel Filler, MD;  Location: [facility] OR;  Service: General;  Laterality: Right;   INSERTION OF MESH Right 10/21/2016   Procedure: INSERTION OF MESH;  Surgeon: Axel Filler, MD;  Location: [facility] OR;  Service: General;  Laterality: Right;   KNEE SURGERY     Multiple bilateral scopes   RETINAL DETACHMENT SURGERY Right    SHOULDER SURGERY     4x on the left - total shoulder replacement.    SHOULDER SURGERY Left    x2   TOTAL SHOULDER ARTHROPLASTY Left    x2    Family History  Problem Relation Age of Onset   Arthritis Mother    Hyperlipidemia Mother    Hypertension Father    Cancer Father    Skin cancer Father    Colon cancer Maternal Grandmother    Cancer Maternal Grandmother    Hypertension Sister    Hypertension Paternal Grandmother    Heart attack Paternal Grandfather    Heart disease Paternal Grandfather  Diabetes Neg Hx    Stroke Neg Hx    Prostate cancer Neg Hx     Social History   Tobacco Use   Smoking status: Former    Types: Cigarettes   Smokeless tobacco: Never  Vaping Use   Vaping status: Never Used  Substance Use Topics   Alcohol use: Not Currently    Alcohol/week: 0.0 standard drinks of alcohol   Drug use: No     Allergies  Allergen Reactions   Fentanyl Other (See Comments)    Hallucinations (with the patch)  Other Reaction(s): Other (See Comments)  Hears voice   Fentanyl Patch   Lisinopril Swelling   Lyrica [Pregabalin]     hallucinations   Metoprolol Other (See Comments)   Sulfa Antibiotics Swelling    Review of Systems NEGATIVE UNLESS OTHERWISE INDICATED IN HPI      Objective:     Temp (!) 97.1 F (36.2 C) (Temporal)   Ht 5\' 10"   (1.778 m)   Wt 172 lb 12.8 oz (78.4 kg)   SpO2 96%   BMI 24.79 kg/m   Wt Readings from Last 3 Encounters:  08/24/23 172 lb 12.8 oz (78.4 kg)  07/21/23 174 lb 6.4 oz (79.1 kg)  04/25/23 172 lb (78 kg)    BP Readings from Last 3 Encounters:  07/21/23 130/88  03/15/23 116/70  12/22/22 138/88     Physical Exam Constitutional:      Appearance: Normal appearance.  Eyes:     Extraocular Movements: Extraocular movements intact.     Conjunctiva/sclera: Conjunctivae normal.     Pupils: Pupils are equal, round, and reactive to light.  Cardiovascular:     Rate and Rhythm: Normal rate and regular rhythm.  Pulmonary:     Effort: Pulmonary effort is normal.     Breath sounds: Normal breath sounds.  Musculoskeletal:     Right lower leg: No edema.     Left lower leg: No edema.  Neurological:     General: No focal deficit present.     Mental Status: He is alert and oriented to person, place, and time.     Cranial Nerves: No cranial nerve deficit.     Sensory: No sensory deficit.     Motor: No weakness.     Coordination: Coordination normal.     Gait: Gait normal.  Psychiatric:        Mood and Affect: Mood normal.        Behavior: Behavior is hyperactive.     Comments: Angry at times when talking about family stress        Assessment & Plan:  Meningioma [facility]) -     CT HEAD W & WO CONTRAST ( ); Future  Status post craniotomy -     CT HEAD W & WO CONTRAST ( ); Future  Dizziness -     CT HEAD W & WO CONTRAST ( ); Future   Reassured pt that orthostatic vitals are normal, reassuring. No acute focal neuro-deficit noted on exam. Very possible that stress and lack of sleep are contributing to his symptoms. However, with history as above, definitely need to r/o any changes in brain such as new meningioma developing. Pt agreeable with plan. Ordered STAT CT head today- pt to go there now for imaging, treat pending results.      Return if symptoms worsen or fail to  improve.  Time Spent: 32 minutes of total time was spent on the date of the encounter performing the following actions: chart review  prior to seeing the patient, obtaining history, performing a medically necessary exam, counseling on the treatment plan, placing orders, and documenting in our EHR.      Ellagrace Yoshida M Azion Centrella, PA-C

## 2023-08-25 ENCOUNTER — Encounter: Payer: Self-pay | Admitting: Family

## 2023-08-25 ENCOUNTER — Encounter: Payer: Self-pay | Admitting: Physician Assistant

## 2023-08-25 NOTE — Telephone Encounter (Signed)
Please advise if ok to send letter for patient via MyChart releasing him to go back to work tonight.

## 2023-08-25 NOTE — Telephone Encounter (Signed)
Patient called for an update. Since pcp approved and pcp/cma are out of office, can you write note and send via MyChart?

## 2023-08-28 DIAGNOSIS — M542 Cervicalgia: Secondary | ICD-10-CM | POA: Diagnosis not present

## 2023-08-30 ENCOUNTER — Other Ambulatory Visit: Payer: Self-pay | Admitting: Physician Assistant

## 2023-09-01 ENCOUNTER — Other Ambulatory Visit: Payer: Self-pay | Admitting: Physician Assistant

## 2023-09-01 ENCOUNTER — Other Ambulatory Visit (HOSPITAL_COMMUNITY): Payer: Self-pay | Admitting: Psychiatry

## 2023-09-01 ENCOUNTER — Other Ambulatory Visit (HOSPITAL_BASED_OUTPATIENT_CLINIC_OR_DEPARTMENT_OTHER): Payer: Self-pay

## 2023-09-01 DIAGNOSIS — F902 Attention-deficit hyperactivity disorder, combined type: Secondary | ICD-10-CM

## 2023-09-01 MED ORDER — ROSUVASTATIN CALCIUM 20 MG PO TABS
20.0000 mg | ORAL_TABLET | Freq: Every day | ORAL | 1 refills | Status: DC
  Filled 2023-09-01: qty 90, 90d supply, fill #0
  Filled 2023-11-30: qty 90, 90d supply, fill #1

## 2023-09-01 MED ORDER — AMPHETAMINE-DEXTROAMPHET ER 20 MG PO CP24
20.0000 mg | ORAL_CAPSULE | Freq: Two times a day (BID) | ORAL | 0 refills | Status: DC
  Filled 2023-09-01 – 2023-09-05 (×2): qty 60, 30d supply, fill #0

## 2023-09-05 ENCOUNTER — Other Ambulatory Visit: Payer: Self-pay

## 2023-09-05 ENCOUNTER — Other Ambulatory Visit (HOSPITAL_BASED_OUTPATIENT_CLINIC_OR_DEPARTMENT_OTHER): Payer: Self-pay

## 2023-09-07 DIAGNOSIS — M542 Cervicalgia: Secondary | ICD-10-CM | POA: Diagnosis not present

## 2023-09-11 ENCOUNTER — Other Ambulatory Visit (HOSPITAL_BASED_OUTPATIENT_CLINIC_OR_DEPARTMENT_OTHER): Payer: Self-pay

## 2023-09-11 MED ORDER — BUTALBITAL-APAP-CAFFEINE 50-325-40 MG PO TABS
1.0000 | ORAL_TABLET | Freq: Four times a day (QID) | ORAL | 1 refills | Status: DC | PRN
  Filled 2023-09-11 – 2023-09-12 (×4): qty 120, 30d supply, fill #0
  Filled 2023-10-08 – 2023-10-10 (×3): qty 120, 30d supply, fill #1

## 2023-09-12 ENCOUNTER — Other Ambulatory Visit: Payer: Self-pay

## 2023-09-12 ENCOUNTER — Other Ambulatory Visit (HOSPITAL_BASED_OUTPATIENT_CLINIC_OR_DEPARTMENT_OTHER): Payer: Self-pay

## 2023-09-14 ENCOUNTER — Other Ambulatory Visit (HOSPITAL_BASED_OUTPATIENT_CLINIC_OR_DEPARTMENT_OTHER): Payer: Self-pay

## 2023-09-14 ENCOUNTER — Other Ambulatory Visit: Payer: Self-pay

## 2023-09-14 DIAGNOSIS — M47818 Spondylosis without myelopathy or radiculopathy, sacral and sacrococcygeal region: Secondary | ICD-10-CM | POA: Diagnosis not present

## 2023-09-14 DIAGNOSIS — M25551 Pain in right hip: Secondary | ICD-10-CM | POA: Diagnosis not present

## 2023-09-14 DIAGNOSIS — R109 Unspecified abdominal pain: Secondary | ICD-10-CM | POA: Diagnosis not present

## 2023-09-14 DIAGNOSIS — M25559 Pain in unspecified hip: Secondary | ICD-10-CM | POA: Diagnosis not present

## 2023-09-14 MED ORDER — MORPHINE SULFATE 30 MG PO TABS: 30.0000 mg | ORAL_TABLET | ORAL | 0 refills | Status: DC | PRN

## 2023-09-28 ENCOUNTER — Other Ambulatory Visit (HOSPITAL_COMMUNITY): Payer: Self-pay | Admitting: Psychiatry

## 2023-09-28 DIAGNOSIS — F902 Attention-deficit hyperactivity disorder, combined type: Secondary | ICD-10-CM

## 2023-09-29 ENCOUNTER — Other Ambulatory Visit: Payer: Self-pay

## 2023-09-29 DIAGNOSIS — H43813 Vitreous degeneration, bilateral: Secondary | ICD-10-CM | POA: Diagnosis not present

## 2023-09-29 DIAGNOSIS — H35372 Puckering of macula, left eye: Secondary | ICD-10-CM | POA: Diagnosis not present

## 2023-09-29 DIAGNOSIS — H35033 Hypertensive retinopathy, bilateral: Secondary | ICD-10-CM | POA: Diagnosis not present

## 2023-09-29 DIAGNOSIS — H59813 Chorioretinal scars after surgery for detachment, bilateral: Secondary | ICD-10-CM | POA: Diagnosis not present

## 2023-09-29 DIAGNOSIS — H2513 Age-related nuclear cataract, bilateral: Secondary | ICD-10-CM | POA: Diagnosis not present

## 2023-10-02 ENCOUNTER — Telehealth (HOSPITAL_COMMUNITY): Payer: Self-pay | Admitting: *Deleted

## 2023-10-02 ENCOUNTER — Other Ambulatory Visit: Payer: Self-pay

## 2023-10-02 ENCOUNTER — Other Ambulatory Visit (HOSPITAL_BASED_OUTPATIENT_CLINIC_OR_DEPARTMENT_OTHER): Payer: Self-pay

## 2023-10-02 DIAGNOSIS — F902 Attention-deficit hyperactivity disorder, combined type: Secondary | ICD-10-CM

## 2023-10-02 MED ORDER — AMPHETAMINE-DEXTROAMPHET ER 20 MG PO CP24
20.0000 mg | ORAL_CAPSULE | Freq: Two times a day (BID) | ORAL | 0 refills | Status: DC
  Filled 2023-10-02 – 2023-10-04 (×4): qty 60, 30d supply, fill #0

## 2023-10-02 NOTE — Telephone Encounter (Signed)
Done

## 2023-10-02 NOTE — Telephone Encounter (Signed)
Pt called requesting a refill of the generic Adderall 20 mg XR BID. Medication last e-scribed on 09/01/23. Pt last visit was on 07/27/23 and he has a f/u scheduled for 10/26/23. Please send to [facility].

## 2023-10-03 ENCOUNTER — Other Ambulatory Visit: Payer: Self-pay

## 2023-10-03 ENCOUNTER — Other Ambulatory Visit (HOSPITAL_BASED_OUTPATIENT_CLINIC_OR_DEPARTMENT_OTHER): Payer: Self-pay

## 2023-10-03 MED ORDER — MELOXICAM 15 MG PO TABS
15.0000 mg | ORAL_TABLET | Freq: Every day | ORAL | 2 refills | Status: AC | PRN
  Filled 2023-10-03: qty 30, 30d supply, fill #0
  Filled 2023-10-29: qty 30, 30d supply, fill #1
  Filled 2023-11-30: qty 30, 30d supply, fill #2

## 2023-10-04 ENCOUNTER — Other Ambulatory Visit (HOSPITAL_BASED_OUTPATIENT_CLINIC_OR_DEPARTMENT_OTHER): Payer: Self-pay

## 2023-10-09 ENCOUNTER — Other Ambulatory Visit (HOSPITAL_BASED_OUTPATIENT_CLINIC_OR_DEPARTMENT_OTHER): Payer: Self-pay

## 2023-10-10 ENCOUNTER — Other Ambulatory Visit (HOSPITAL_BASED_OUTPATIENT_CLINIC_OR_DEPARTMENT_OTHER): Payer: Self-pay

## 2023-10-10 ENCOUNTER — Other Ambulatory Visit: Payer: Self-pay

## 2023-10-12 ENCOUNTER — Other Ambulatory Visit (HOSPITAL_BASED_OUTPATIENT_CLINIC_OR_DEPARTMENT_OTHER): Payer: Self-pay

## 2023-10-12 DIAGNOSIS — M47818 Spondylosis without myelopathy or radiculopathy, sacral and sacrococcygeal region: Secondary | ICD-10-CM | POA: Diagnosis not present

## 2023-10-12 DIAGNOSIS — M25559 Pain in unspecified hip: Secondary | ICD-10-CM | POA: Diagnosis not present

## 2023-10-12 DIAGNOSIS — M25551 Pain in right hip: Secondary | ICD-10-CM | POA: Diagnosis not present

## 2023-10-12 DIAGNOSIS — R109 Unspecified abdominal pain: Secondary | ICD-10-CM | POA: Diagnosis not present

## 2023-10-12 MED ORDER — BUTALBITAL-APAP-CAFFEINE 50-325-40 MG PO TABS
1.0000 | ORAL_TABLET | Freq: Four times a day (QID) | ORAL | 1 refills | Status: DC | PRN
  Filled 2023-11-06 – 2023-11-07 (×2): qty 120, 30d supply, fill #0
  Filled 2023-11-30 – 2023-12-05 (×3): qty 120, 30d supply, fill #1

## 2023-10-15 DIAGNOSIS — H43393 Other vitreous opacities, bilateral: Secondary | ICD-10-CM | POA: Diagnosis not present

## 2023-10-16 ENCOUNTER — Encounter: Payer: Self-pay | Admitting: Physician Assistant

## 2023-10-17 NOTE — Telephone Encounter (Signed)
Please see pt msg and advise if patient should schedule here or send referral to podiatry

## 2023-10-26 ENCOUNTER — Ambulatory Visit (HOSPITAL_COMMUNITY): Payer: Commercial Managed Care - PPO | Admitting: Psychiatry

## 2023-10-26 ENCOUNTER — Other Ambulatory Visit (HOSPITAL_BASED_OUTPATIENT_CLINIC_OR_DEPARTMENT_OTHER): Payer: Self-pay

## 2023-10-26 ENCOUNTER — Encounter (HOSPITAL_COMMUNITY): Payer: Self-pay | Admitting: Psychiatry

## 2023-10-26 DIAGNOSIS — F902 Attention-deficit hyperactivity disorder, combined type: Secondary | ICD-10-CM | POA: Diagnosis not present

## 2023-10-26 MED ORDER — AMPHETAMINE-DEXTROAMPHET ER 20 MG PO CP24
20.0000 mg | ORAL_CAPSULE | Freq: Two times a day (BID) | ORAL | 0 refills | Status: DC
  Filled 2023-10-26 – 2023-11-03 (×6): qty 60, 30d supply, fill #0

## 2023-10-26 NOTE — Progress Notes (Signed)
BH MD/PA/NP OP Progress Note   Patient location; office Provider location; office  10/26/2023 3:31 PM Jeremiah Anderson  MRN:  [id number]  Chief Complaint:  Chief Complaint  Patient presents with   Follow-up   HPI: Patient came in for his follow-up appointment.  He reported lately more overwhelmed because of family situation.  He is having issues with his sister.  He is in a process of writing a letter to the response that he received a letter from his sister few months ago.  Patient told the letter is too long and he was about to answer but started him very irritable, angry and frustrated.  He has a dizzy spell and he was seen by his primary care and required in a CT scan.  Patient has a history of brain tumor.  Patient told that his CT scan was normal.  Patient told his sister and mother is making him more upset and talking about the past events which he like to move on.  Otherwise patient reported job is going well.  He is settling slowly and gradually much better and learning things on his own place.  He sleeps good.  He takes the Adderall most of the days but some times he skip when he wants to sleep better.  He has no tremor or shakes but during the conversation he was appear somewhat disturbed with the family situation.  He was getting distracted and upset.  He remained fidgety and restless during the conversation but he admitted family situation makes him more upset.  His girlfriend is currently in [place] and coming back to the Sunday.  He started doing workout and physical therapy to help his neck pain.  He is doing push-ups to get his neck pain strengthening.  He denies drinking or using any illegal substances.  Has any paranoia, suicidal thoughts or homicidal thoughts.  He is working night shift.  He is getting pain medicine from pain management.  His appetite is okay and he had gained few pounds since the last visit.  Currently he denies any dizziness.  He denies any hallucination.  Visit  Diagnosis:    ICD-10-CM   1. Attention deficit hyperactivity disorder (ADHD), combined type  F90.2 amphetamine-dextroamphetamine (ADDERALL XR) 20 MG 24 hr capsule         Past Psychiatric History: Reviewed. H/O ADHD diagnosed in 2005.  Took Adderall until 2013 but stopped after taking pain medication. 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  H/O suicidal thoughts when going through withdrawals but no attempts. No H/O mania.  Had tried Adderall for a while and then recently Vyvanse but had GI side effects.  Strattera did not help.  PCP tried trazodone for sleep S/P brain surgery. H/O alcohol on and off but no current use. No history of cocaine or any IV drugs.   Past Medical History:  Past Medical History:  Diagnosis Date   Allergy    Arthritis    Blood transfusion without reported diagnosis    around age 34 after major nausea secondary to prescribed MS Contin   GERD (gastroesophageal reflux disease)    Hyperlipidemia    Hypertension    Migraines    Wears glasses     Past Surgical History:  Procedure Laterality Date   APPENDECTOMY  1980s   post op bleeding sent him back for ex lap and hemostatic intervention.    BACK SURGERY     8 surgeries total - fused from  L1-S1   COLONOSCOPY     Age 75. For bleeding   ESOPHAGOGASTRODUODENOSCOPY N/A 06/10/2015   Procedure: ESOPHAGOGASTRODUODENOSCOPY (EGD);  Surgeon: Hart Carwin, MD;  Location: [facility] ENDOSCOPY;  Service: Endoscopy;  Laterality: N/A;   ex lap with peri gastric vessel ligation  1990s   after retching, he rupture a blood vessel "outside" the stomach.  vessel was ligated per pt's descriiption.    INCISIONAL HERNIA REPAIR Right 10/21/2016   Procedure: OPEN FLANK HERNIA REPAIR WITH MESH;  Surgeon: Axel Filler, MD;  Location: [facility] OR;  Service: General;  Laterality: Right;   INSERTION OF MESH Right 10/21/2016   Procedure: INSERTION OF MESH;  Surgeon: Axel Filler, MD;  Location: [facility] OR;   Service: General;  Laterality: Right;   KNEE SURGERY     Multiple bilateral scopes   RETINAL DETACHMENT SURGERY Right    SHOULDER SURGERY     4x on the left - total shoulder replacement.    SHOULDER SURGERY Left    x2   TOTAL SHOULDER ARTHROPLASTY Left    x2    Family Psychiatric History: Reviewed.  Family History:  Family History  Problem Relation Age of Onset   Arthritis Mother    Hyperlipidemia Mother    Hypertension Father    Cancer Father    Skin cancer Father    Colon cancer Maternal Grandmother    Cancer Maternal Grandmother    Hypertension Sister    Hypertension Paternal Grandmother    Heart attack Paternal Grandfather    Heart disease Paternal Grandfather    Diabetes Neg Hx    Stroke Neg Hx    Prostate cancer Neg Hx     Social History:  Social History   Socioeconomic History   Marital status: Single    Spouse name: Not on file   Number of children: 0   Years of education: 16   Highest education level: Bachelor's degree (e.g., BA, AB, BS)  Occupational History   Occupation: Disability    Comment: Former Charity fundraiser  Tobacco Use   Smoking status: Former    Types: Cigarettes   Smokeless tobacco: Never  Vaping Use   Vaping status: Never Used  Substance and Sexual Activity   Alcohol use: Not Currently    Alcohol/week: 0.0 standard drinks of alcohol   Drug use: No   Sexual activity: Not on file  Other Topics Concern   Not on file  Social History Narrative   Fun: play guitar / limited funds      Denies any religious beliefs effecting health care.    Social Determinants of Health   Financial Resource Strain: Low Risk  (04/24/2023)   Overall Financial Resource Strain (CARDIA)    Difficulty of Paying Living Expenses: Not very hard  Food Insecurity: No Food Insecurity (04/24/2023)   Hunger Vital Sign    Worried About Running Out of Food in the Last Year: Never true    Ran Out of Food in the Last Year: Never true  Transportation Needs: No Transportation Needs  (04/24/2023)   PRAPARE - Administrator, Civil Service (Medical): No    Lack of Transportation (Non-Medical): No  Physical Activity: Insufficiently Active (04/24/2023)   Exercise Vital Sign    Days of Exercise per Week: 3 days    Minutes of Exercise per Session: 30 min  Stress: Stress Concern Present (04/24/2023)   [employer] of Occupational Health - Occupational Stress Questionnaire    Feeling of Stress : To some extent  Social Connections: Unknown (04/24/2023)   Social Connection and Isolation Panel [NHANES]    Frequency of Communication with Friends and Family: Once a week    Frequency of Social Gatherings with Friends and Family: Once a week    Attends Religious Services: Patient declined    Database administrator or Organizations: No    Attends Banker Meetings: Never    Marital Status: Living with partner    Allergies:  Allergies  Allergen Reactions   Fentanyl Other (See Comments)    Hallucinations (with the patch)  Other Reaction(s): Other (See Comments)  Hears voice   Fentanyl Patch   Lisinopril Swelling   Lyrica [Pregabalin]     hallucinations   Metoprolol Other (See Comments)   Sulfa Antibiotics Swelling    Metabolic Disorder Labs: Lab Results  Component Value Date   HGBA1C 5.6 07/21/2023   No results found for: "PROLACTIN" Lab Results  Component Value Date   CHOL 160 07/21/2023   TRIG 121.0 07/21/2023   HDL 56.90 07/21/2023   CHOLHDL 3 07/21/2023   VLDL 24.2 07/21/2023   LDLCALC 79 07/21/2023   LDLCALC 80 04/11/2022   Lab Results  Component Value Date   TSH 1.31 07/21/2023   TSH 0.93 11/04/2021    Therapeutic Level Labs: No results found for: "LITHIUM" No results found for: "VALPROATE" No results found for: "CBMZ"  Current Medications: Current Outpatient Medications  Medication Sig Dispense Refill   amLODipine (NORVASC) 10 MG tablet Take 1 tablet (10 mg total) by mouth daily. 90 tablet 1    amphetamine-dextroamphetamine (ADDERALL XR) 20 MG 24 hr capsule Take 1 capsule (20 mg total) by mouth in the morning and at bedtime. 60 capsule 0   butalbital-acetaminophen-caffeine (BAC) 50-325-40 MG tablet Take 1 tablet by mouth every 6 (six) hours as needed. 120 tablet 1   butalbital-acetaminophen-caffeine (BAC) 50-325-40 MG tablet Take 1 tablet by mouth every 6 (six) hours as needed. 120 tablet 1   butalbital-acetaminophen-caffeine (BAC) 50-325-40 MG tablet Take 1 tablet by mouth every 6 (six) hours as needed. 120 tablet 1   Cholecalciferol (VITAMIN D3) 25 MCG (1000 UT) CAPS Take by mouth daily.     Coenzyme Q10 (COQ10) 100 MG CAPS Take 100 mg by mouth daily.     cyanocobalamin 100 MCG tablet Take 100 mcg by mouth daily.     fluticasone (FLONASE) 50 MCG/ACT nasal spray Place 2 sprays into both nostrils daily. 16 g 11   Lidocaine 4 % PTCH Place 1-2 patches onto the skin 2 (two) times daily as needed (for pain.). Salonpas Maximum Strength Pain Relieving Gel-Patch     meloxicam (MOBIC) 15 MG tablet Take 1 tablet (15 mg total) by mouth daily as needed. 30 tablet 2   morphine (MSIR) 30 MG tablet Take 30 mg by mouth every 4 (four) hours as needed.     Multiple Vitamin (MULTIVITAMIN WITH MINERALS) TABS tablet Take 1 tablet by mouth daily.     naloxone (NARCAN) nasal spray 4 mg/0.1 mL Place into the nose.     Omega-3 Fatty Acids (OMEGA 3 PO) Take 120 mg by mouth daily.     ondansetron (ZOFRAN) 4 MG tablet TAKE 1 TABLET BY MOUTH EVERY 8 HOURS AS NEEDED FOR NAUSEA AND VOMITING 30 tablet 3   rosuvastatin (CRESTOR) 20 MG tablet Take 1 tablet (20 mg total) by mouth daily. 90 tablet 1   SUMAtriptan (IMITREX) 100 MG tablet MAY REPEAT IN 2 HOURS IF HEADACHE PERSISTS OR RECURS. 9  tablet 3   tazarotene (TAZORAC) 0.1 % gel APPLY A SMALL AMOUNT TO AFFECTED AREA AT BEDTIME     testosterone cypionate (DEPOTESTOSTERONE CYPIONATE) 200 MG/ML injection Inject 0.5 mLs (100 mg total) into the muscle once a week. 10 mL 1    No current facility-administered medications for this visit.    Psychiatric Specialty Exam: Physical Exam  Review of Systems  Neurological:  Negative for dizziness.  Psychiatric/Behavioral:  The patient is nervous/anxious and is hyperactive.     Blood pressure (!) 167/82, pulse 85, height 5\' 9"  (1.753 m), weight 182 lb (82.6 kg).There is no height or weight on file to calculate BMI.  General Appearance: Casual  Eye Contact:  Good  Speech:  Normal Rate  Volume:  Normal  Mood:  Anxious  Affect:  Congruent  Thought Process:  Descriptions of Associations: Intact  Orientation:  Full (Time, Place, and Person)  Thought Content:  Rumination  Suicidal Thoughts:  No  Homicidal Thoughts:  No  Memory:  Immediate;   Good Recent;   Good Remote;   Good  Judgement:  Intact  Insight:  Present  Psychomotor Activity:  Increased, Restlessness, and fidgety  Concentration:  Concentration: Fair and Attention Span: Fair  Recall:  Good  Fund of Knowledge:  Good  Language:  Good  Akathisia:  No  Handed:  Right  AIMS (if indicated):     Assets:  Communication Skills Desire for Improvement Housing Resilience Social Support Talents/Skills Transportation  ADL's:  Intact  Cognition:  WNL  Sleep:   ok       Screenings: GAD-7    Loss adjuster, chartered Office Visit from 08/24/2023 in [facility] [employer] from 07/21/2023 in [facility] [employer] [facility] [employer] from 12/22/2022 in [facility] [employer] [facility] [address]  Total GAD-7 Score 2 1 2       PHQ2-9    Flowsheet Row Office Visit from 08/24/2023 in [facility] [employer] from 07[facility]21/2023 in [facility] [employer] [facility] [address] Clinical Support from 04/25/2023 in [facility] [employer] from 12/22/2022 in [facility] [employer] [facility] [employer] from 12[facility]15/2022 in  [facility]  PHQ-2 Total Score 1 2 0 1 1  PHQ-9 Total Score 6 5 -- 3 5      Flowsheet Row Office Visit from 12/15/2022 in [facility]  C-SSRS RISK CATEGORY Error: Question 6 not populated        Assessment and Plan: ADHD, combined type  Discussed psychosocial stressors especially having issues related to his sister and mother.  Recommend that he may not need to answer the latter response if it makes him more irritable and frustrated.  Recommend breathing technique and using relaxing technique to get things under control.  We talk about anxiety related to psychosocial stressors/family stress may not be helpful.  I recommend we continue to feel more anxious and nervous than we may need to add antianxiety medication.  Patient stand and agree.  We also discussed about getting random urine test since patient is taking stimulant.  Patient told he is getting random urine test at pain management and he has no issues for drug test.  We will continue Adderall XR 20 mg at 4 and 8 PM before he go to work.  His blood pressure is under control.  I  review recent CT scan and no new finding noticed.  Reassurance given.  Discussed to use coping skills.  Recommend to call us back if he has any question or any concern.  Follow-up in 3 months   Collaboration of Care: Collaboration of Care: Other provider involved in patient's care AEB notes are available in epic to review.  Patient/Guardian was advised Release of Information must be obtained prior to any record release in order to collaborate their care with an outside provider. Patient/Guardian was advised if they have not already done so to contact the registration department to sign all necessary forms in order for [place] to release information regarding their care.   Consent: Patient/Guardian gives verbal consent for treatment and assignment of benefits for services provided during this  visit. Patient/Guardian expressed understanding and agreed to proceed.   I provided 25 minutes face-to-face time during this encounter.  Cleotis Nipper, MD 10/26/2023, 3:31 PM

## 2023-10-29 ENCOUNTER — Other Ambulatory Visit: Payer: Self-pay | Admitting: Physician Assistant

## 2023-10-30 ENCOUNTER — Other Ambulatory Visit (HOSPITAL_BASED_OUTPATIENT_CLINIC_OR_DEPARTMENT_OTHER): Payer: Self-pay

## 2023-10-30 ENCOUNTER — Other Ambulatory Visit: Payer: Self-pay

## 2023-10-31 ENCOUNTER — Other Ambulatory Visit: Payer: Self-pay

## 2023-10-31 ENCOUNTER — Other Ambulatory Visit (HOSPITAL_BASED_OUTPATIENT_CLINIC_OR_DEPARTMENT_OTHER): Payer: Self-pay

## 2023-10-31 DIAGNOSIS — L570 Actinic keratosis: Secondary | ICD-10-CM | POA: Diagnosis not present

## 2023-10-31 DIAGNOSIS — D485 Neoplasm of uncertain behavior of skin: Secondary | ICD-10-CM | POA: Diagnosis not present

## 2023-10-31 DIAGNOSIS — L821 Other seborrheic keratosis: Secondary | ICD-10-CM | POA: Diagnosis not present

## 2023-10-31 DIAGNOSIS — D229 Melanocytic nevi, unspecified: Secondary | ICD-10-CM | POA: Diagnosis not present

## 2023-10-31 DIAGNOSIS — L578 Other skin changes due to chronic exposure to nonionizing radiation: Secondary | ICD-10-CM | POA: Diagnosis not present

## 2023-10-31 DIAGNOSIS — L814 Other melanin hyperpigmentation: Secondary | ICD-10-CM | POA: Diagnosis not present

## 2023-10-31 MED ORDER — FLUTICASONE PROPIONATE 50 MCG/ACT NA SUSP
2.0000 | Freq: Every day | NASAL | 11 refills | Status: DC
  Filled 2023-10-31: qty 16, 30d supply, fill #0
  Filled 2023-12-04: qty 16, 30d supply, fill #1
  Filled 2024-01-29: qty 16, 30d supply, fill #2

## 2023-11-01 ENCOUNTER — Other Ambulatory Visit (HOSPITAL_BASED_OUTPATIENT_CLINIC_OR_DEPARTMENT_OTHER): Payer: Self-pay

## 2023-11-03 ENCOUNTER — Other Ambulatory Visit: Payer: Self-pay

## 2023-11-03 ENCOUNTER — Other Ambulatory Visit (HOSPITAL_BASED_OUTPATIENT_CLINIC_OR_DEPARTMENT_OTHER): Payer: Self-pay

## 2023-11-06 ENCOUNTER — Encounter (HOSPITAL_BASED_OUTPATIENT_CLINIC_OR_DEPARTMENT_OTHER): Payer: Self-pay

## 2023-11-06 ENCOUNTER — Other Ambulatory Visit (HOSPITAL_BASED_OUTPATIENT_CLINIC_OR_DEPARTMENT_OTHER): Payer: Self-pay

## 2023-11-07 ENCOUNTER — Other Ambulatory Visit (HOSPITAL_BASED_OUTPATIENT_CLINIC_OR_DEPARTMENT_OTHER): Payer: Self-pay

## 2023-11-07 ENCOUNTER — Other Ambulatory Visit: Payer: Self-pay

## 2023-11-21 ENCOUNTER — Other Ambulatory Visit (HOSPITAL_BASED_OUTPATIENT_CLINIC_OR_DEPARTMENT_OTHER): Payer: Self-pay

## 2023-11-21 ENCOUNTER — Other Ambulatory Visit: Payer: Self-pay

## 2023-11-21 ENCOUNTER — Encounter: Payer: Self-pay | Admitting: Physician Assistant

## 2023-11-21 MED ORDER — AMLODIPINE BESYLATE 10 MG PO TABS
10.0000 mg | ORAL_TABLET | Freq: Every day | ORAL | 1 refills | Status: DC
  Filled 2023-11-21: qty 90, 90d supply, fill #0
  Filled 2024-02-14: qty 90, 90d supply, fill #1

## 2023-11-29 ENCOUNTER — Other Ambulatory Visit (HOSPITAL_BASED_OUTPATIENT_CLINIC_OR_DEPARTMENT_OTHER): Payer: Self-pay

## 2023-11-29 ENCOUNTER — Telehealth (HOSPITAL_COMMUNITY): Payer: Self-pay | Admitting: *Deleted

## 2023-11-29 DIAGNOSIS — F902 Attention-deficit hyperactivity disorder, combined type: Secondary | ICD-10-CM

## 2023-11-29 MED ORDER — AMPHETAMINE-DEXTROAMPHET ER 20 MG PO CP24
20.0000 mg | ORAL_CAPSULE | Freq: Two times a day (BID) | ORAL | 0 refills | Status: DC
  Filled 2023-11-29 – 2023-12-02 (×3): qty 60, 30d supply, fill #0
  Filled ????-??-??: fill #0

## 2023-11-29 NOTE — Telephone Encounter (Signed)
 Pt called requesting a refill of Adderall XR 20 mg BID. Last e-scribed on 10/26/23 during last visit. Pt has a f/u appointment scheduled for 01/25/24. Please send to [facility]. Pharmacy is in pt profile. Please review.

## 2023-11-29 NOTE — Telephone Encounter (Signed)
Sent. He can pick up on his due date.  ?

## 2023-12-01 ENCOUNTER — Other Ambulatory Visit: Payer: Self-pay

## 2023-12-01 ENCOUNTER — Other Ambulatory Visit (HOSPITAL_BASED_OUTPATIENT_CLINIC_OR_DEPARTMENT_OTHER): Payer: Self-pay

## 2023-12-01 ENCOUNTER — Telehealth (HOSPITAL_COMMUNITY): Payer: Self-pay
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
## 2023-12-01 NOTE — Telephone Encounter (Signed)
 I have no issue if pharmacy gave him today.  They need to clear from the insurance.

## 2023-12-02 ENCOUNTER — Other Ambulatory Visit (HOSPITAL_BASED_OUTPATIENT_CLINIC_OR_DEPARTMENT_OTHER): Payer: Self-pay

## 2023-12-04 ENCOUNTER — Other Ambulatory Visit (HOSPITAL_BASED_OUTPATIENT_CLINIC_OR_DEPARTMENT_OTHER): Payer: Self-pay

## 2023-12-04 ENCOUNTER — Other Ambulatory Visit (HOSPITAL_COMMUNITY): Payer: Self-pay

## 2023-12-04 DIAGNOSIS — F902 Attention-deficit hyperactivity disorder, combined type: Secondary | ICD-10-CM

## 2023-12-05 ENCOUNTER — Other Ambulatory Visit (HOSPITAL_BASED_OUTPATIENT_CLINIC_OR_DEPARTMENT_OTHER): Payer: Self-pay

## 2023-12-05 DIAGNOSIS — M542 Cervicalgia: Secondary | ICD-10-CM | POA: Diagnosis not present

## 2023-12-06 ENCOUNTER — Other Ambulatory Visit: Payer: Self-pay | Admitting: Urology

## 2023-12-06 DIAGNOSIS — M25551 Pain in right hip: Secondary | ICD-10-CM | POA: Diagnosis not present

## 2023-12-06 DIAGNOSIS — M47818 Spondylosis without myelopathy or radiculopathy, sacral and sacrococcygeal region: Secondary | ICD-10-CM | POA: Diagnosis not present

## 2023-12-06 DIAGNOSIS — R109 Unspecified abdominal pain: Secondary | ICD-10-CM | POA: Diagnosis not present

## 2023-12-06 DIAGNOSIS — M25559 Pain in unspecified hip: Secondary | ICD-10-CM | POA: Diagnosis not present

## 2023-12-14 ENCOUNTER — Other Ambulatory Visit: Payer: Self-pay | Admitting: Physician Assistant

## 2023-12-28 ENCOUNTER — Telehealth (HOSPITAL_COMMUNITY): Payer: Self-pay

## 2023-12-28 ENCOUNTER — Other Ambulatory Visit (HOSPITAL_BASED_OUTPATIENT_CLINIC_OR_DEPARTMENT_OTHER): Payer: Self-pay

## 2023-12-28 DIAGNOSIS — F902 Attention-deficit hyperactivity disorder, combined type: Secondary | ICD-10-CM

## 2023-12-28 MED ORDER — AMPHETAMINE-DEXTROAMPHET ER 20 MG PO CP24: 20.0000 mg | ORAL_CAPSULE | Freq: Two times a day (BID) | ORAL | 0 refills | Status: DC

## 2023-12-28 NOTE — Telephone Encounter (Signed)
 His prescription is not due until February 11.  I sent the prescription and he can pick up on his due date.

## 2023-12-28 NOTE — Telephone Encounter (Signed)
 Patient is requesting a refill on his Adderall , patient uses [facility]. He last got it 1/8 and he has a follow up 3/6, please review and advise, thank you

## 2023-12-30 ENCOUNTER — Other Ambulatory Visit (HOSPITAL_BASED_OUTPATIENT_CLINIC_OR_DEPARTMENT_OTHER): Payer: Self-pay

## 2024-01-01 ENCOUNTER — Other Ambulatory Visit (HOSPITAL_BASED_OUTPATIENT_CLINIC_OR_DEPARTMENT_OTHER): Payer: Self-pay

## 2024-01-01 MED ORDER — BUTALBITAL-APAP-CAFFEINE 50-325-40 MG PO TABS
1.0000 | ORAL_TABLET | Freq: Four times a day (QID) | ORAL | 1 refills | Status: DC | PRN
  Filled 2024-01-03: qty 120, 30d supply, fill #0
  Filled 2024-01-29: qty 120, 30d supply, fill #1

## 2024-01-02 DIAGNOSIS — Z79899 Other long term (current) drug therapy: Secondary | ICD-10-CM | POA: Diagnosis not present

## 2024-01-02 LAB — CBC AND DIFFERENTIAL
HCT: 43 (ref 41–53)
Hemoglobin: 14.4 (ref 13.5–17.5)

## 2024-01-02 LAB — PSA: PSA: 0.5

## 2024-01-02 LAB — TESTOSTERONE: Testosterone: 1275

## 2024-01-03 ENCOUNTER — Other Ambulatory Visit (HOSPITAL_BASED_OUTPATIENT_CLINIC_OR_DEPARTMENT_OTHER): Payer: Self-pay

## 2024-01-04 DIAGNOSIS — M25551 Pain in right hip: Secondary | ICD-10-CM | POA: Diagnosis not present

## 2024-01-04 DIAGNOSIS — M25559 Pain in unspecified hip: Secondary | ICD-10-CM | POA: Diagnosis not present

## 2024-01-04 DIAGNOSIS — M47818 Spondylosis without myelopathy or radiculopathy, sacral and sacrococcygeal region: Secondary | ICD-10-CM | POA: Diagnosis not present

## 2024-01-04 DIAGNOSIS — R109 Unspecified abdominal pain: Secondary | ICD-10-CM | POA: Diagnosis not present

## 2024-01-08 ENCOUNTER — Other Ambulatory Visit: Payer: Self-pay | Admitting: Physician Assistant

## 2024-01-11 DIAGNOSIS — M5412 Radiculopathy, cervical region: Secondary | ICD-10-CM | POA: Diagnosis not present

## 2024-01-11 DIAGNOSIS — G5622 Lesion of ulnar nerve, left upper limb: Secondary | ICD-10-CM | POA: Diagnosis not present

## 2024-01-11 DIAGNOSIS — G5603 Carpal tunnel syndrome, bilateral upper limbs: Secondary | ICD-10-CM | POA: Diagnosis not present

## 2024-01-12 ENCOUNTER — Encounter: Payer: Self-pay | Admitting: Physician Assistant

## 2024-01-12 ENCOUNTER — Ambulatory Visit (INDEPENDENT_AMBULATORY_CARE_PROVIDER_SITE_OTHER): Payer: Medicare Other | Admitting: Physician Assistant

## 2024-01-12 ENCOUNTER — Other Ambulatory Visit (HOSPITAL_BASED_OUTPATIENT_CLINIC_OR_DEPARTMENT_OTHER): Payer: Self-pay

## 2024-01-12 ENCOUNTER — Ambulatory Visit: Payer: Medicare Other

## 2024-01-12 VITALS — BP 136/80 | HR 75 | Temp 97.5°F | Ht 69.0 in | Wt 186.4 lb

## 2024-01-12 DIAGNOSIS — M5489 Other dorsalgia: Secondary | ICD-10-CM | POA: Diagnosis not present

## 2024-01-12 DIAGNOSIS — M25531 Pain in right wrist: Secondary | ICD-10-CM

## 2024-01-12 DIAGNOSIS — G8929 Other chronic pain: Secondary | ICD-10-CM | POA: Diagnosis not present

## 2024-01-12 DIAGNOSIS — M4326 Fusion of spine, lumbar region: Secondary | ICD-10-CM

## 2024-01-12 DIAGNOSIS — M2550 Pain in unspecified joint: Secondary | ICD-10-CM

## 2024-01-12 DIAGNOSIS — L600 Ingrowing nail: Secondary | ICD-10-CM

## 2024-01-12 DIAGNOSIS — E230 Hypopituitarism: Secondary | ICD-10-CM | POA: Diagnosis not present

## 2024-01-12 DIAGNOSIS — M1811 Unilateral primary osteoarthritis of first carpometacarpal joint, right hand: Secondary | ICD-10-CM | POA: Diagnosis not present

## 2024-01-12 DIAGNOSIS — M79672 Pain in left foot: Secondary | ICD-10-CM

## 2024-01-12 DIAGNOSIS — M130 Polyarthritis, unspecified: Secondary | ICD-10-CM | POA: Diagnosis not present

## 2024-01-12 LAB — URIC ACID: Uric Acid, Serum: 5.2 mg/dL (ref 4.0–7.8)

## 2024-01-12 LAB — SEDIMENTATION RATE: Sed Rate: 5 mm/h (ref 0–20)

## 2024-01-12 NOTE — Progress Notes (Signed)
 Patient ID: Royce Stegman, male    DOB: 07/09/1966, 58 y.o.   MRN: [id number]   Assessment & Plan:  Chronic foot pain, left -     Ambulatory referral to Podiatry -     Uric acid  Ingrown toenail of both feet -     Ambulatory referral to Podiatry  Acute pain of right wrist -     DG Wrist Complete Right; Future -     Sedimentation rate -     Uric acid  Polyarthralgia -     Sedimentation rate -     ANA, IFA Comprehensive Panel -     Rheumatoid factor -     HLA-B27 antigen -     Uric acid  Fusion of spine of lumbar region -     Sedimentation rate -     ANA, IFA Comprehensive Panel -     Rheumatoid factor -     HLA-B27 antigen -     Uric acid  Other chronic back pain -     Sedimentation rate -     ANA, IFA Comprehensive Panel -     Rheumatoid factor -     HLA-B27 antigen -     Uric acid     Assessment and Plan    Foot Pain Recurrent pain in the foot, possibly related to a bone spur. Patient has been using compression socks and new shoes with some relief. -Refer to podiatry for further evaluation and management.  Thumb Pain Pain in both thumbs, possibly related to arthritis or carpal tunnel syndrome. Pain is exacerbated by certain movements and activities. Worse in the R wrist / thumb.  -Order wrist x-ray today to evaluate for any structural abnormalities. -Waiting on EMG results from his Pain management physician as well.  Elbow Pain Pain in the elbow, possibly related to overuse or strain. -Consider physical therapy or modifications in activities to reduce strain on the elbow.  Chronic Pain and Fatigue Chronic pain and fatigue, possibly related to multiple orthopedic issues and the physical demands of the patient's job. -Discuss potential job modifications or alternative employment options that may be less physically demanding. -Consider referral to pain management for optimization of pain control regimen.  General Health Maintenance -Order labs to evaluate  for possible autoimmune disease given the patient's multiple joint issues and family history of rheumatoid arthritis.       Subjective:    Chief Complaint  Patient presents with   bone spur    Pt c/o bone spur and ingrown toenail   Elbow Pain    Pt c/o numbness in his right elbow radiating to wrist and thumb   right ankle pain    Pt c/o right  ankle pain/circulation issues.    HPI Discussed the use of AI scribe software for clinical note transcription with the patient, who gave verbal consent to proceed.  History of Present Illness   Amanda Pote is a 58 year old male with multiple orthopedic issues who presents with pain management concerns and evaluation of his symptoms.  He has pain in his right arm, thumb, and ankle. After a history of neck surgery, he initially experienced improved mobility in his left arm, but now has pain radiating down the R arm when turning his head. His thumb is swollen and painful, and he is concerned about a possible connection to his neck or carpal tunnel syndrome, as indicated by a recent EMG test.  He has a history of a  bone spur left foot. Despite these measures, he reports intermittent pain that occurs unexpectedly. An x-ray in the past showed a bone spur. He engages in high levels of physical activity at work, often reaching up to twenty thousand steps a night, which exacerbates his ankle pain. He describes a demanding work environment that contributes to his overall fatigue and pain.  He has chronic issues with ingrown toenails since high school, which he has managed by removing them. Due to difficulty reaching his toes after a spinal fusion, he experiences significant discomfort when attempting to care for them. The toenails grow in a way that causes pain and irritation.  His medication regimen includes pain management medications, but he finds it difficult to assess the severity of his symptoms due to constant medication use. He is concerned about  the impact of his current job on his physical health and is considering a change to reduce physical strain.  In terms of social history, he consumes a significant amount of ice cream and Diet Coke, which is a concern for his family. He does not drink coffee and relies on Diet Coke for caffeine. He is aware of the potential health implications but finds it challenging to reduce his intake due to fatigue.  Family history includes arthritis in his mother and rheumatoid arthritis in his uncle. He recalls a past evaluation for an autoimmune condition due to joint and soft tissue issues, but no definitive diagnosis was made.       Past Medical History:  Diagnosis Date   Allergy    Arthritis    Blood transfusion without reported diagnosis    around age 60 after major nausea secondary to prescribed MS Contin   GERD (gastroesophageal reflux disease)    Hyperlipidemia    Hypertension    Migraines    Wears glasses     Past Surgical History:  Procedure Laterality Date   APPENDECTOMY  1980s   post op bleeding sent him back for ex lap and hemostatic intervention.    BACK SURGERY     8 surgeries total - fused from L1-S1   COLONOSCOPY     Age 61. For bleeding   ESOPHAGOGASTRODUODENOSCOPY N/A 06/10/2015   Procedure: ESOPHAGOGASTRODUODENOSCOPY (EGD);  Surgeon: Hart Carwin, MD;  Location: [facility] ENDOSCOPY;  Service: Endoscopy;  Laterality: N/A;   ex lap with peri gastric vessel ligation  1990s   after retching, he rupture a blood vessel "outside" the stomach.  vessel was ligated per pt's descriiption.    INCISIONAL HERNIA REPAIR Right 10/21/2016   Procedure: OPEN FLANK HERNIA REPAIR WITH MESH;  Surgeon: Axel Filler, MD;  Location: [facility] OR;  Service: General;  Laterality: Right;   INSERTION OF MESH Right 10/21/2016   Procedure: INSERTION OF MESH;  Surgeon: Axel Filler, MD;  Location: [facility] OR;  Service: General;  Laterality: Right;   KNEE SURGERY     Multiple bilateral scopes   RETINAL DETACHMENT  SURGERY Right    SHOULDER SURGERY     4x on the left - total shoulder replacement.    SHOULDER SURGERY Left    x2   TOTAL SHOULDER ARTHROPLASTY Left    x2    Family History  Problem Relation Age of Onset   Arthritis Mother    Hyperlipidemia Mother    Hypertension Father    Cancer Father    Skin cancer Father    Colon cancer Maternal Grandmother    Cancer Maternal Grandmother    Hypertension Sister    Hypertension  Paternal Grandmother    Heart attack Paternal Grandfather    Heart disease Paternal Grandfather    Diabetes Neg Hx    Stroke Neg Hx    Prostate cancer Neg Hx     Social History   Tobacco Use   Smoking status: Former    Types: Cigarettes   Smokeless tobacco: Never  Vaping Use   Vaping status: Never Used  Substance Use Topics   Alcohol use: Not Currently    Alcohol/week: 0.0 standard drinks of alcohol   Drug use: No     Allergies  Allergen Reactions   Fentanyl Other (See Comments)    Hallucinations (with the patch)  Other Reaction(s): Other (See Comments)  Hears voice   Fentanyl Patch   Lisinopril Swelling   Lyrica [Pregabalin]     hallucinations   Metoprolol Other (See Comments)   Sulfa Antibiotics Swelling    Review of Systems NEGATIVE UNLESS OTHERWISE INDICATED IN HPI      Objective:     BP 136/80   Pulse 75   Temp (!) 97.5 F (36.4 C)   Ht 5\' 9"  (1.753 m)   Wt 186 lb 6.4 oz (84.6 kg)   SpO2 99%   BMI 27.53 kg/m   Wt Readings from Last 3 Encounters:  01/12/24 186 lb 6.4 oz (84.6 kg)  08/24/23 172 lb 12.8 oz (78.4 kg)  07/21/23 174 lb 6.4 oz (79.1 kg)    BP Readings from Last 3 Encounters:  01/12/24 136/80  07/21/23 130/88  03/15/23 116/70     Physical Exam Constitutional:      Appearance: Normal appearance.  Eyes:     Extraocular Movements: Extraocular movements intact.     Conjunctiva/sclera: Conjunctivae normal.     Pupils: Pupils are equal, round, and reactive to light.  Cardiovascular:     Rate and Rhythm:  Normal rate and regular rhythm.  Pulmonary:     Effort: Pulmonary effort is normal.     Breath sounds: Normal breath sounds.  Musculoskeletal:     Right wrist: Swelling (R thumb base, diffusely tender) present. No snuff box tenderness or crepitus. Normal range of motion. Normal pulse.     Right lower leg: No edema.     Left lower leg: No edema.  Neurological:     General: No focal deficit present.     Mental Status: He is alert and oriented to person, place, and time.     Cranial Nerves: No cranial nerve deficit.     Sensory: No sensory deficit.     Motor: No weakness.     Coordination: Coordination normal.     Gait: Gait normal.  Psychiatric:        Mood and Affect: Mood normal.        Behavior: Behavior is hyperactive.     Time Spent: 39 minutes of total time was spent on the date of the encounter performing the following actions: chart review prior to seeing the patient, obtaining history, performing a medically necessary exam, counseling on the treatment plan, placing orders, and documenting in our EHR.       Avagail Whittlesey M Wise Fees, PA-C

## 2024-01-13 ENCOUNTER — Other Ambulatory Visit (HOSPITAL_BASED_OUTPATIENT_CLINIC_OR_DEPARTMENT_OTHER): Payer: Self-pay

## 2024-01-15 ENCOUNTER — Other Ambulatory Visit (HOSPITAL_BASED_OUTPATIENT_CLINIC_OR_DEPARTMENT_OTHER): Payer: Self-pay

## 2024-01-15 ENCOUNTER — Encounter: Payer: Self-pay | Admitting: Physician Assistant

## 2024-01-15 ENCOUNTER — Encounter (HOSPITAL_BASED_OUTPATIENT_CLINIC_OR_DEPARTMENT_OTHER): Payer: Self-pay

## 2024-01-16 LAB — ANA, IFA COMPREHENSIVE PANEL
Anti Nuclear Antibody (ANA): NEGATIVE
ENA SM Ab Ser-aCnc: <1 AI
SM/RNP: <1 AI
SSA (Ro) (ENA) Antibody, IgG: <1 AI
SSB (La) (ENA) Antibody, IgG: <1 AI
Scleroderma (Scl-70) (ENA) Antibody, IgG: <1 AI
ds DNA Ab: <1 [IU]/mL

## 2024-01-16 LAB — HLA-B27 ANTIGEN: HLA-B27 Antigen: NEGATIVE

## 2024-01-16 LAB — RHEUMATOID FACTOR: Rheumatoid fact SerPl-aCnc: <10 [IU]/mL (ref <14)

## 2024-01-17 ENCOUNTER — Ambulatory Visit: Payer: Medicare Other | Admitting: Podiatry

## 2024-01-17 ENCOUNTER — Other Ambulatory Visit (HOSPITAL_COMMUNITY): Payer: Self-pay | Admitting: Physician Assistant

## 2024-01-17 ENCOUNTER — Other Ambulatory Visit: Payer: Self-pay

## 2024-01-17 DIAGNOSIS — M1810 Unilateral primary osteoarthritis of first carpometacarpal joint, unspecified hand: Secondary | ICD-10-CM

## 2024-01-17 DIAGNOSIS — M4802 Spinal stenosis, cervical region: Secondary | ICD-10-CM

## 2024-01-17 DIAGNOSIS — D329 Benign neoplasm of meninges, unspecified: Secondary | ICD-10-CM

## 2024-01-18 ENCOUNTER — Other Ambulatory Visit: Payer: Self-pay

## 2024-01-18 ENCOUNTER — Ambulatory Visit (INDEPENDENT_AMBULATORY_CARE_PROVIDER_SITE_OTHER): Payer: Medicare Other | Admitting: Family Medicine

## 2024-01-18 ENCOUNTER — Encounter: Payer: Self-pay | Admitting: Family Medicine

## 2024-01-18 VITALS — BP 150/94 | HR 71 | Ht 69.0 in | Wt 181.0 lb

## 2024-01-18 DIAGNOSIS — M79644 Pain in right finger(s): Secondary | ICD-10-CM | POA: Diagnosis not present

## 2024-01-18 NOTE — Patient Instructions (Signed)
 Thank you for coming in today.   Please use Voltaren gel (Generic Diclofenac Gel) up to 4x daily for pain as needed.  This is available over-the-counter as both the name brand Voltaren gel and the generic diclofenac gel.   Plan for OT with Nate.   Let me know if this is not working.   I can do an injection whenever we need to.

## 2024-01-18 NOTE — Progress Notes (Signed)
   I, Stevenson Clinch, CMA acting as a scribe for Clementeen Graham, MD.  Jeremiah Anderson is a 58 y.o. male who presents to [facility] today for R thumb pain x 3-4 months. Pt locates pain to base of the thumb. Sx worse with pushing up. Sx started after doing push-ups in an effort to start working out. Has tried bracing but it is uncomfortable.   He is a Designer, jewellery.  Aggravates: pushing up Treatments tried: brace  Dx testing: 01/12/24 R wrist XR & labs  Pertinent review of systems: No fevers or chills  Relevant historical information: Extensive cervical history including C-spine and L-spine surgeries.   Exam:  BP (!) 150/94   Pulse 71   Ht 5\' 9"  (1.753 m)   Wt 181 lb (82.1 kg)   SpO2 100%   BMI 26.73 kg/m  General: Well Developed, well nourished, and in no acute distress.   MSK: Right thumb bossing at first [facility].  Mildly tender to palpation.    Lab and Radiology Results  X-ray images right wrist obtained at PCP office on January 12, 2024 personally independently interpreted showing moderate thumb arthritis at first [facility].    Assessment and Plan: 58 y.o. 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  Plan for trial of occupational therapy.  Additionally recommend Voltaren gel.  If not improved consider injection.   PDMP not reviewed this encounter. Orders Placed This Encounter  Procedures   Ambulatory referral to Occupational Therapy    Referral Priority:   Routine    Referral Type:   Occupational Therapy    Referral Reason:   Specialty Services Required    Requested Specialty:   Occupational Therapy    Number of Visits Requested:   1   No orders of the defined types were placed in this encounter.    Discussed warning signs or symptoms. Please see discharge instructions. Patient expresses understanding.   The above documentation has been reviewed and is accurate and complete Clementeen Graham, M.D.

## 2024-01-19 ENCOUNTER — Encounter: Payer: Self-pay | Admitting: Physician Assistant

## 2024-01-24 ENCOUNTER — Ambulatory Visit (INDEPENDENT_AMBULATORY_CARE_PROVIDER_SITE_OTHER): Payer: Medicare Other | Admitting: Podiatry

## 2024-01-24 ENCOUNTER — Ambulatory Visit (HOSPITAL_COMMUNITY)
Admission: RE | Admit: 2024-01-24 | Discharge: 2024-01-24 | Disposition: A | Discharge status: Home / Self Care | Payer: Medicare Other | Source: Ambulatory Visit | Attending: Physician Assistant | Admitting: Physician Assistant

## 2024-01-24 ENCOUNTER — Encounter: Payer: Self-pay | Admitting: Podiatry

## 2024-01-24 VITALS — Ht 69.0 in | Wt 181.0 lb

## 2024-01-24 DIAGNOSIS — M79674 Pain in right toe(s): Secondary | ICD-10-CM | POA: Diagnosis not present

## 2024-01-24 DIAGNOSIS — M4802 Spinal stenosis, cervical region: Secondary | ICD-10-CM | POA: Insufficient documentation

## 2024-01-24 DIAGNOSIS — M79675 Pain in left toe(s): Secondary | ICD-10-CM | POA: Diagnosis not present

## 2024-01-24 DIAGNOSIS — L600 Ingrowing nail: Secondary | ICD-10-CM

## 2024-01-24 DIAGNOSIS — B351 Tinea unguium: Secondary | ICD-10-CM | POA: Diagnosis not present

## 2024-01-24 DIAGNOSIS — D329 Benign neoplasm of meninges, unspecified: Secondary | ICD-10-CM | POA: Insufficient documentation

## 2024-01-24 DIAGNOSIS — D320 Benign neoplasm of cerebral meninges: Secondary | ICD-10-CM | POA: Diagnosis not present

## 2024-01-24 DIAGNOSIS — G9389 Other specified disorders of brain: Secondary | ICD-10-CM | POA: Diagnosis not present

## 2024-01-24 MED ORDER — GADOBUTROL 1 MMOL/ML IV SOLN
8.0000 mL | Freq: Once | INTRAVENOUS | Status: AC | PRN
  Administered 2024-01-24: 8 mL via INTRAVENOUS

## 2024-01-24 NOTE — Patient Instructions (Signed)

## 2024-01-24 NOTE — Progress Notes (Signed)
 Subjective:   Patient ID: Jeremiah Anderson, male   DOB: 58 y.o.   MRN: [id number]   HPI Patient presents with very painful ingrown toenail of the left big toe stating it is hard to wear shoe gear comfortably and this has been going on and then also presents with thick nailbeds 1-5 both feet that he cannot cut with severe back issues and history of brain surgery.  Patient does not smoke likes to be active   Review of Systems  All other systems reviewed and are negative.       Objective:  Physical Exam Vitals and nursing note reviewed.  Constitutional:      Appearance: He is well-developed.  Pulmonary:     Effort: Pulmonary effort is normal.  Musculoskeletal:        General: Normal range of motion.  Skin:    General: Skin is warm.  Neurological:     Mental Status: He is alert.     Neurovascular status intact muscle strength was found to be adequate range of motion adequate with incurvated medial and lateral border of the left big toe painful when pressed no active drainage or redness and elongated thick nailbeds bilateral that are irritated for him and he cannot cut cannot reach.  Good digital perfusion well oriented     Assessment:  Ingrown toenail deformity hallux left medial lateral borders painful and elongated nailbeds 1-5 both feet thick that are painful     Plan:  H&P reviewed discussed the condition and recommended correction of the nail borders patient wanting this done and I went ahead and I allowed him to read signed consent form.  I infiltrated the left hallux and remove move the medial lateral borders after sterile prep with sterile instrumentation exposed matrix and applied phenol 3 applications 30 seconds followed by alcohol lavage sterile dressing gave instructions on soaks wear dressings 24 hours take it off earlier if any throbbing were to occur it debrided nailbeds 1-5 both feet

## 2024-01-25 ENCOUNTER — Ambulatory Visit (HOSPITAL_COMMUNITY): Payer: Commercial Managed Care - PPO | Admitting: Psychiatry

## 2024-01-26 ENCOUNTER — Telehealth: Payer: Self-pay

## 2024-01-26 DIAGNOSIS — F902 Attention-deficit hyperactivity disorder, combined type: Secondary | ICD-10-CM

## 2024-01-26 NOTE — Telephone Encounter (Signed)
 Patient called requesting a refill for the following medication please advise   amphetamine-dextroamphetamine (ADDERALL XR) 20 MG 24 hr capsule    Last visit 10-26-23 Next visit 02-15-24  Preferred pharmacy [facility] [place], [place] - [address] Phone: [phone]  Fax: [phone]

## 2024-01-29 ENCOUNTER — Other Ambulatory Visit (HOSPITAL_BASED_OUTPATIENT_CLINIC_OR_DEPARTMENT_OTHER): Payer: Self-pay

## 2024-01-29 MED ORDER — AMPHETAMINE-DEXTROAMPHET ER 20 MG PO CP24: 20.0000 mg | ORAL_CAPSULE | Freq: Two times a day (BID) | ORAL | 0 refills | Status: DC

## 2024-01-29 NOTE — Addendum Note (Signed)
 Addended by: Kathryne Sharper T on: 01/29/2024 08:33 AM   Modules accepted: Orders

## 2024-01-29 NOTE — Telephone Encounter (Signed)
 Sent prescription to [facility].

## 2024-01-30 ENCOUNTER — Other Ambulatory Visit: Payer: Self-pay

## 2024-01-30 ENCOUNTER — Other Ambulatory Visit (HOSPITAL_BASED_OUTPATIENT_CLINIC_OR_DEPARTMENT_OTHER): Payer: Self-pay

## 2024-01-30 DIAGNOSIS — D329 Benign neoplasm of meninges, unspecified: Secondary | ICD-10-CM | POA: Diagnosis not present

## 2024-02-05 NOTE — Therapy (Signed)
 OUTPATIENT OCCUPATIONAL THERAPY ORTHO EVALUATION  Patient Name: Jeremiah Anderson MRN: [id number] DOB:[birth year], 58 y.o., male Today's Date: 02/06/2024  PCP: Allwardt, A. PA-C REFERRING PROVIDER:  Rodolph Bong, MD    END OF SESSION:  OT End of Session - 02/06/24 1439     Visit Number 1    Number of Visits 12    Date for OT Re-Evaluation 03/22/24    Authorization Type [employer]    OT Start Time 1439    OT Stop Time 1521    OT Time Calculation (min) 42 min    Activity Tolerance Patient tolerated treatment well;No increased pain;Patient limited by pain    Behavior During Therapy [facility] for tasks assessed/performed             Past Medical History:  Diagnosis Date   Allergy    Arthritis    Blood transfusion without reported diagnosis    around age 49 after major nausea secondary to prescribed MS Contin   GERD (gastroesophageal reflux disease)    Hyperlipidemia    Hypertension    Migraines    Wears glasses    Past Surgical History:  Procedure Laterality Date   APPENDECTOMY  1980s   post op bleeding sent him back for ex lap and hemostatic intervention.    BACK SURGERY     8 surgeries total - fused from L1-S1   COLONOSCOPY     Age 66. For bleeding   ESOPHAGOGASTRODUODENOSCOPY N/A 06/10/2015   Procedure: ESOPHAGOGASTRODUODENOSCOPY (EGD);  Surgeon: Hart Carwin, MD;  Location: [facility] ENDOSCOPY;  Service: Endoscopy;  Laterality: N/A;   ex lap with peri gastric vessel ligation  1990s   after retching, he rupture a blood vessel "outside" the stomach.  vessel was ligated per pt's descriiption.    INCISIONAL HERNIA REPAIR Right 10/21/2016   Procedure: OPEN FLANK HERNIA REPAIR WITH MESH;  Surgeon: Axel Filler, MD;  Location: [facility] OR;  Service: General;  Laterality: Right;   INSERTION OF MESH Right 10/21/2016   Procedure: INSERTION OF MESH;  Surgeon: Axel Filler, MD;  Location: [facility] OR;  Service: General;  Laterality: Right;   KNEE SURGERY     Multiple bilateral scopes   RETINAL  DETACHMENT SURGERY Right    SHOULDER SURGERY     4x on the left - total shoulder replacement.    SHOULDER SURGERY Left    x2   TOTAL SHOULDER ARTHROPLASTY Left    x2   Patient Active Problem List   Diagnosis Date Noted   Palpable mass of lower back 07/21/2023   Encounter for annual physical exam 07/21/2023   Spinal stenosis, cervical region 03/15/2023   Chronic left shoulder pain 12/22/2022   Meningioma (HCC) 10/07/2022   Seizure (HCC) 10/07/2022   Testosterone deficiency 11/04/2020   Vitamin D deficiency 11/04/2020   Attention deficit hyperactivity disorder (ADHD), combined type 11/04/2020   S/P foot surgery, right 10/20/2020   Morton's neuroma of right foot 08/05/2020   Elevated LFTs 03/23/2020   Fusion of spine of lumbar region 10/31/2019   Spondylolysis of cervical region 10/31/2019   Chronic, continuous use of opioids 07/17/2019   Facet arthropathy, cervical 07/17/2019   Neck pain 12/17/2017   Patellar tendinitis of right knee 08/18/2017   Medicare annual wellness visit, subsequent 01/27/2017   Abscess of nasal cavity 06/14/2016   Right flank mass 06/14/2016   Tobacco use disorder 06/14/2016   Overweight (BMI 25.0-29.9) 06/14/2016   Sinusitis, chronic 08/12/2015   Arthralgia 07/06/2015   Chronic back pain 06/10/2015  Multiple duodenal ulcers 06/10/2015   Hyperlipidemia 05/07/2015   Migraines 04/21/2015   Essential hypertension 02/16/2015   Impingement syndrome of shoulder 08/07/2014   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    ONSET DATE: approx 5 month onset Rt thumb pain and Rt arm pain  REFERRING DIAG: M79.644 (ICD-10-CM) - Thumb pain, right   THERAPY DIAG:  Pain in joint of right hand - Plan: Ot plan of care cert/re-cert  Muscle weakness (generalized) - Plan: Ot plan of care cert/re-cert  Pain in right elbow - Plan: Ot plan of care cert/re-cert  Rationale for Evaluation and Treatment: Rehabilitation  SUBJECTIVE:   SUBJECTIVE STATEMENT: He got here a  little late, states history of over 20 surgeries, a lot of comorbidities including cervical fusions which caused his Lt arm to go numb. He had some flaccid weakness, was getting therapy in PT for Lt arm, states jamming his right thumb and also developing pain at both epicondyles in the right arm from possibly overusing his arm.  These issues have been bothering him for 5 months, with slight improvement in epicondyle pain.  He had EMG that showed CTS in Lt arm but no nerve impingements in Rt arm.  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    PERTINENT HISTORY: Over 20 different surgeries to knees, left shoulder, spine, and other areas of his body, somewhat hyperactive in nature, also was at the lead in college, recently recovering from left arm weakness after cervical fusion, describes a history of laxity in his connective tissues  PRECAUTIONS: Cervical  RED FLAGS: None   WEIGHT BEARING RESTRICTIONS: No  PAIN:  Are you having pain? Yes: NPRS scale: "mild" at rest in Rt thumb, but pain in thumb and medial epicondyle go up to "severe" at times during the week Pain location: Right thumb basal joint and right medial epicondyle Pain description: Aching and sore Aggravating factors: Weightbearing, wrist flexion, repetitive gripping Relieving factors: Rest and heat modalities  FALLS: Has patient fallen in last 6 months? No  PLOF: Independent  PATIENT GOALS: To decrease pain and symptoms in right hand/thumb as well as elbow medial epicondyle  NEXT MD VISIT: As needed   OBJECTIVE: (All objective assessments below are from initial evaluation on: 02/06/24 unless otherwise specified.)   HAND DOMINANCE: Right   ADLs: Overall ADLs: States decreased ability to grab, hold household objects, pain and difficulty to open containers, perform FMS tasks   FUNCTIONAL OUTCOME MEASURES: Eval: Patient Specific Functional Scale: 5 (drawing labs, opening jars, nursing duties)  (Higher Score  =  Better  Ability for the Selected Tasks)      UPPER EXTREMITY ROM     Shoulder to Wrist AROM Right eval  Shoulder flexion   Shoulder abduction   Shoulder extension   Shoulder internal rotation   Shoulder external rotation   Elbow flexion full  Elbow extension full  Forearm supination full  Forearm pronation  full  Wrist flexion 84  Wrist extension 80  (Blank rows = not tested)   Hand AROM Right eval  Full Fist Ability (or Gap to Distal Palmar Crease) full  Thumb Opposition  (Kapandji Scale)  10/10  (Blank rows = not tested)   UPPER EXTREMITY MMT:     MMT Right 02/06/2024  Elbow flexion 4+/5  Elbow extension 4+/5  Forearm supination   Forearm pronation   Wrist flexion 4/5 tender to medial epicondyle  Wrist extension 4+/5 only slightly tender to lateral epicondyle  Wrist ulnar deviation   Wrist radial deviation   (  Blank rows = not tested)  HAND FUNCTION: Eval: No significant observed weakness in affected right hand, though mild pain present.  Grip strength Right: 96 lbs, Left: 61 lbs   COORDINATION: Eval: No observed coordination impairments with affected rt hand.   SENSATION: Eval:  Light touch intact today  EDEMA:   Eval: Some mild swelling in the right thumb CMC joint as well as the MCP joints of bilateral hands digits 2 and 3  COGNITION: Eval: Overall cognitive status: WFL for evaluation today, though he is very active, shifting positions, talking fast, appears almost manic, but he states this is typical for him.   OBSERVATIONS:   Eval: positive resisted wrist flexion, TTP to medial epicondyle and flexor mass,  less so to lateral side.   TTP to Rt thumb basal joint and sore and pain with motion.  Interestingly hyperflexible with a possible history of laxity issues similar to Ehlers-Danlos syndrome.  Presents as rt medial epicondylitis, very mild lateral epicondylitis, and right thumb CMC joint arthritis.   TODAY'S TREATMENT:  Post-evaluation treatment:    He was given safety/self-care information on managing hand arthritis which is listed under patient instructions.  The management techniques were briefly discussed with him today, and may need future review.  He was also given the following home exercise program to try to address both pain in the epicondyles as well as thumb arthritis and pain.  The bolded exercises below were reviewed with him, but we did not have enough time today to do all of the exercises due to the length and complexity of the evaluation.   Exercises - Seated Wrist Flexion Stretch  - 3-4 x daily - 3 reps - 15 second  hold - Seated Wrist Extension Stretch  - 3-6 x daily - 3-5 reps - 15 hold - Stretch Thumb DOWNWARD  - 3 x daily - 3 reps - 15 sec hold - Thumb Webspace Stretch  - 3 x daily - 3 reps - 15 sec hold - Towel Roll Grip with Forearm in Neutral  - 3 x daily - 5 reps - 10 sec hold - Spread Index Finger Apart  - 3 x daily - 5 reps - 5 sec hold - C-Strength (try using rubber band)   - 3 x daily - 5 reps - 5 sec hold   PATIENT EDUCATION: Education details: See tx section above for details  Person educated: Patient Education method: Verbal Instruction, Teach back, Handouts  Education comprehension: States and demonstrates understanding, Additional Education required    HOME EXERCISE PROGRAM: Access Code: [email or web address] URL: [email or web address] Date: 02/06/2024 Prepared by: Fannie Knee    GOALS: Goals reviewed with patient? Yes   SHORT TERM GOALS: (STG required if POC>30 days) Target Date: 02/23/2024  Pt will obtain protective, custom orthotic. Goal status: TBD/PRN  2.  Pt will demo/state understanding of initial HEP to improve pain levels and prerequisite motion. Goal status: INITIAL   LONG TERM GOALS: Target Date: 03/22/24  Pt will improve functional ability by decreased impairment per PSFS assessment from 5 to 7 or better, for better quality of life. Goal status: INITIAL  2.  Pt  will improve strength in right wrist flexion from tender 4/5 MMT to at least nontender 4+/5 MMT to have increased functional ability to carry out selfcare and higher-level homecare tasks with less difficulty. Goal status: INITIAL  3.  Pt will decrease pain at worst from "severe" to "mild" or better to have better sleep and occupational participation in daily roles.  Goal status: INITIAL   ASSESSMENT:  CLINICAL IMPRESSION: Patient is a 58 y.o. male who was seen today for occupational therapy evaluation for pain in the right thumb basal joint as well as medial epicondyle pain and mild lingering lateral epicondyle pain-all which are thought to be exacerbations from overuse of the right upper extremity while going through intensive rehab for flaccid weakness in the left upper extremity after neck and shoulder surgeries.  This evaluation was moderately complex due to the number and chronicity of comorbidities and past complaints as well as patient's somewhat manic presentation during the evaluation.  He will benefit from outpatient occupational therapy to decrease symptoms and increase quality of life.   PERFORMANCE DEFICITS: in functional skills including ADLs, IADLs, strength, pain, fascial restrictions, body mechanics, endurance, decreased knowledge of precautions, and UE functional use, cognitive skills including attention, problem solving, and safety awareness, and psychosocial skills including coping strategies, environmental adaptation, and habits.   IMPAIRMENTS: are limiting patient from IADLs, work, and leisure.   COMORBIDITIES: has co-morbidities such as numerous upper body surgeries, arthritis, hypertension, and possibly others  that affects occupational performance. Patient will benefit from skilled OT to address above impairments and improve overall function.  MODIFICATION OR ASSISTANCE TO COMPLETE EVALUATION: Min-Moderate modification of tasks or assist with assess necessary to complete an  evaluation.  OT OCCUPATIONAL PROFILE AND HISTORY: Detailed assessment: Review of records and additional review of physical, cognitive, psychosocial history related to current functional performance.  CLINICAL DECISION MAKING: Moderate - several treatment options, min-mod task modification necessary  REHAB POTENTIAL: Good  EVALUATION COMPLEXITY: Moderate      PLAN:  OT FREQUENCY: 1-2x/week  OT DURATION: 6 weeks through 03/22/24 and up to 12 total visits as needed   PLANNED INTERVENTIONS: 97168 OT Re-evaluation, 97535 self care/ADL training, 95621 therapeutic exercise, 97530 therapeutic activity, 97112 neuromuscular re-education, 97140 manual therapy, 97035 ultrasound, 97039 fluidotherapy, 97010 moist heat, 97010 cryotherapy, 97033 iontophoresis, 97760 Orthotics management and training, 30865 Splinting (initial encounter), [id number] Subsequent splinting/medication, compression bandaging, Dry needling, energy conservation, coping strategies training, and patient/family education  RECOMMENDED OTHER SERVICES: none now   CONSULTED AND AGREED WITH PLAN OF CARE: Patient  PLAN FOR NEXT SESSION:  Add bicep/tricep stretches as helpful for epicondylitis pain, check current thumb home exercise program and finish exercises as time allows.   Fannie Knee, OTR/L, CHT 02/06/2024, 6:04 PM    Date of referral: 01/18/2024  Referring provider:   Rodolph Bong, MD   Referring diagnosis? M79.644 (ICD-10-CM) - Thumb pain, right  Treatment diagnosis? (if different than referring diagnosis) M25.521, M25.541  What was this (referring dx) caused by? Ongoing Issue  Ashby Dawes of Condition: Initial Onset (within last 3 months)   Laterality: Rt  Current Functional Measure Score: Other PSFS 5/10  Objective measurements identify impairments when they are compared to normal values, the uninvolved extremity, and prior level of function.  []  Yes  []  No  Objective assessment of functional ability: Minimal  functional limitations   Briefly describe symptoms: Pain in the right hand and forearm, decreased functional ability with home and work tasks  How did symptoms start: Insidious onset, likely from overuse while recovering from left arm weakness from other surgeries and problems.  Average pain intensity:  Last 24 hours: 2-3/10  Past week: 3-4/10  How often does the pt experience symptoms? Frequently  How much have the symptoms interfered with usual daily activities? A little bit  How has condition changed since care began at this facility? NA -  initial visit  In general, how is the patients overall health? Good   BACK PAIN (STarT Back Screening Tool) No

## 2024-02-06 ENCOUNTER — Other Ambulatory Visit (HOSPITAL_BASED_OUTPATIENT_CLINIC_OR_DEPARTMENT_OTHER): Payer: Self-pay

## 2024-02-06 ENCOUNTER — Ambulatory Visit: Admitting: Rehabilitative and Restorative Service Providers"

## 2024-02-06 ENCOUNTER — Encounter: Payer: Self-pay | Admitting: Rehabilitative and Restorative Service Providers"

## 2024-02-06 DIAGNOSIS — M25551 Pain in right hip: Secondary | ICD-10-CM | POA: Diagnosis not present

## 2024-02-06 DIAGNOSIS — M25541 Pain in joints of right hand: Secondary | ICD-10-CM | POA: Diagnosis not present

## 2024-02-06 DIAGNOSIS — M6281 Muscle weakness (generalized): Secondary | ICD-10-CM

## 2024-02-06 DIAGNOSIS — R109 Unspecified abdominal pain: Secondary | ICD-10-CM | POA: Diagnosis not present

## 2024-02-06 DIAGNOSIS — M47818 Spondylosis without myelopathy or radiculopathy, sacral and sacrococcygeal region: Secondary | ICD-10-CM | POA: Diagnosis not present

## 2024-02-06 DIAGNOSIS — M25559 Pain in unspecified hip: Secondary | ICD-10-CM | POA: Diagnosis not present

## 2024-02-06 DIAGNOSIS — M25521 Pain in right elbow: Secondary | ICD-10-CM | POA: Diagnosis not present

## 2024-02-06 DIAGNOSIS — Z79891 Long term (current) use of opiate analgesic: Secondary | ICD-10-CM | POA: Diagnosis not present

## 2024-02-06 MED ORDER — BUTALBITAL-APAP-CAFFEINE 50-325-40 MG PO TABS
1.0000 | ORAL_TABLET | Freq: Four times a day (QID) | ORAL | 1 refills | Status: DC | PRN
  Filled 2024-02-25 – 2024-02-26 (×2): qty 120, 30d supply, fill #0
  Filled 2024-03-25: qty 120, 30d supply, fill #1

## 2024-02-06 NOTE — Patient Instructions (Signed)

## 2024-02-07 DIAGNOSIS — G894 Chronic pain syndrome: Secondary | ICD-10-CM | POA: Diagnosis not present

## 2024-02-07 DIAGNOSIS — Z79891 Long term (current) use of opiate analgesic: Secondary | ICD-10-CM | POA: Diagnosis not present

## 2024-02-14 DIAGNOSIS — M542 Cervicalgia: Secondary | ICD-10-CM | POA: Diagnosis not present

## 2024-02-14 DIAGNOSIS — M79644 Pain in right finger(s): Secondary | ICD-10-CM | POA: Diagnosis not present

## 2024-02-14 DIAGNOSIS — M5412 Radiculopathy, cervical region: Secondary | ICD-10-CM | POA: Diagnosis not present

## 2024-02-14 NOTE — Therapy (Signed)
 OUTPATIENT OCCUPATIONAL THERAPY TREATMENT NOTE  Patient Name: Jeremiah Anderson MRN: [id number] DOB:03/11/1966, 58 y.o., male Today's Date: 02/19/2024  PCP: Allwardt, A. PA-C REFERRING PROVIDER:  Rodolph Bong, MD    END OF SESSION:  OT End of Session - 02/19/24 1522     Visit Number 2    Number of Visits 12    Date for OT Re-Evaluation 03/22/24    Authorization Type [employer]    OT Start Time 1522    OT Stop Time 1607    OT Time Calculation (min) 45 min    Equipment Utilized During Treatment [id number]    Activity Tolerance Patient tolerated treatment well;No increased pain;Patient limited by pain    Behavior During Therapy [facility] for tasks assessed/performed              Past Medical History:  Diagnosis Date   Allergy    Arthritis    Blood transfusion without reported diagnosis    around age 72 after major nausea secondary to prescribed MS Contin   GERD (gastroesophageal reflux disease)    Hyperlipidemia    Hypertension    Migraines    Wears glasses    Past Surgical History:  Procedure Laterality Date   APPENDECTOMY  1980s   post op bleeding sent him back for ex lap and hemostatic intervention.    BACK SURGERY     8 surgeries total - fused from L1-S1   COLONOSCOPY     Age 56. For bleeding   ESOPHAGOGASTRODUODENOSCOPY N/A 06/10/2015   Procedure: ESOPHAGOGASTRODUODENOSCOPY (EGD);  Surgeon: Hart Carwin, MD;  Location: [facility] ENDOSCOPY;  Service: Endoscopy;  Laterality: N/A;   ex lap with peri gastric vessel ligation  1990s   after retching, he rupture a blood vessel "outside" the stomach.  vessel was ligated per pt's descriiption.    INCISIONAL HERNIA REPAIR Right 10/21/2016   Procedure: OPEN FLANK HERNIA REPAIR WITH MESH;  Surgeon: Axel Filler, MD;  Location: [facility] OR;  Service: General;  Laterality: Right;   INSERTION OF MESH Right 10/21/2016   Procedure: INSERTION OF MESH;  Surgeon: Axel Filler, MD;  Location: [facility] OR;  Service: General;  Laterality: Right;   KNEE SURGERY      Multiple bilateral scopes   RETINAL DETACHMENT SURGERY Right    SHOULDER SURGERY     4x on the left - total shoulder replacement.    SHOULDER SURGERY Left    x2   TOTAL SHOULDER ARTHROPLASTY Left    x2   Patient Active Problem List   Diagnosis Date Noted   Palpable mass of lower back 07/21/2023   Encounter for annual physical exam 07/21/2023   Spinal stenosis, cervical region 03/15/2023   Chronic left shoulder pain 12/22/2022   Meningioma (HCC) 10/07/2022   Seizure (HCC) 10/07/2022   Testosterone deficiency 11/04/2020   Vitamin D deficiency 11/04/2020   Attention deficit hyperactivity disorder (ADHD), combined type 11/04/2020   S/P foot surgery, right 10/20/2020   Morton's neuroma of right foot 08/05/2020   Elevated LFTs 03/23/2020   Fusion of spine of lumbar region 10/31/2019   Spondylolysis of cervical region 10/31/2019   Chronic, continuous use of opioids 07/17/2019   Facet arthropathy, cervical 07/17/2019   Neck pain 12/17/2017   Patellar tendinitis of right knee 08/18/2017   Medicare annual wellness visit, subsequent 01/27/2017   Abscess of nasal cavity 06/14/2016   Right flank mass 06/14/2016   Tobacco use disorder 06/14/2016   Overweight (BMI 25.0-29.9) 06/14/2016   Sinusitis, chronic 08/12/2015  Arthralgia 07/06/2015   Chronic back pain 06/10/2015   Multiple duodenal ulcers 06/10/2015   Hyperlipidemia 05/07/2015   Migraines 04/21/2015   Essential hypertension 02/16/2015   Impingement syndrome of shoulder 08/07/2014   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    ONSET DATE: approx 5 month onset Rt thumb pain and Rt arm pain  REFERRING DIAG: M79.644 (ICD-10-CM) - Thumb pain, right   THERAPY DIAG:  Pain in joint of right hand  Muscle weakness (generalized)  Pain in right elbow  Rationale for Evaluation and Treatment: Rehabilitation  PERTINENT HISTORY: Over 20 different surgeries to knees, left shoulder, spine, and other areas of his body, somewhat  hyperactive in nature, also was at the lead in college, recently recovering from left arm weakness after cervical fusion, describes a history of laxity in his connective tissues He got here a little late, states history of over 20 surgeries, a lot of comorbidities including cervical fusions which caused his Lt arm to go numb. He had some flaccid weakness, was getting therapy in PT for Lt arm, states jamming his right thumb and also developing pain at both epicondyles in the right arm from possibly overusing his arm.  These issues have been bothering him for 5 months, with slight improvement in epicondyle pain.  He had EMG that showed CTS in Lt arm but no nerve impingements in Rt arm.  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   PRECAUTIONS: Cervical  RED FLAGS: None   WEIGHT BEARING RESTRICTIONS: No   SUBJECTIVE:   SUBJECTIVE STATEMENT: He states his thumb is still hurting, but his medial epicondyle does not have any pain today which is a big improvement.  He asks questions about when he can start working out and he also brought his braces in for the therapist to see.     PAIN:  Are you having pain? Yes: NPRS scale: rates resting pain 4/10 Rt thumb, medial epicondyle slightly less painful   Pain location: Right thumb basal joint and right medial epicondyle Pain description: Aching and sore Aggravating factors: Weightbearing, wrist flexion, repetitive gripping Relieving factors: Rest and heat modalities   PATIENT GOALS: To decrease pain and symptoms in right hand/thumb as well as elbow medial epicondyle  NEXT MD VISIT: As needed   OBJECTIVE: (All objective assessments below are from initial evaluation on: 02/06/24 unless otherwise specified.)   HAND DOMINANCE: Right   ADLs: Overall ADLs: States decreased ability to grab, hold household objects, pain and difficulty to open containers, perform FMS tasks   FUNCTIONAL OUTCOME MEASURES: Eval: Patient Specific Functional Scale: 5  (drawing labs, opening jars, nursing duties)  (Higher Score  =  Better Ability for the Selected Tasks)      UPPER EXTREMITY ROM     Shoulder to Wrist AROM Right eval  Shoulder flexion   Shoulder abduction   Shoulder extension   Shoulder internal rotation   Shoulder external rotation   Elbow flexion full  Elbow extension full  Forearm supination full  Forearm pronation  full  Wrist flexion 84  Wrist extension 80  (Blank rows = not tested)   Hand AROM Right eval  Full Fist Ability (or Gap to Distal Palmar Crease) full  Thumb Opposition  (Kapandji Scale)  10/10  (Blank rows = not tested)   UPPER EXTREMITY MMT:     MMT Right 02/06/2024  Elbow flexion 4+/5  Elbow extension 4+/5  Forearm supination   Forearm pronation   Wrist flexion 4/5 tender to medial epicondyle  Wrist extension 4+/5  only slightly tender to lateral epicondyle  Wrist ulnar deviation   Wrist radial deviation   (Blank rows = not tested)  HAND FUNCTION: Eval: No significant observed weakness in affected right hand, though mild pain present.  Grip strength Right: 96 lbs, Left: 61 lbs   EDEMA:   Eval: Some mild swelling in the right thumb CMC joint as well as the MCP joints of bilateral hands digits 2 and 3  COGNITION: Eval: Overall cognitive status: WFL for evaluation today, though he is very active, shifting positions, talking fast, appears almost manic, but he states this is typical for him.   OBSERVATIONS:   Eval: positive resisted wrist flexion, TTP to medial epicondyle and flexor mass,  less so to lateral side.   TTP to Rt thumb basal joint and sore and pain with motion.  Interestingly hyperflexible with a possible history of laxity issues similar to Ehlers-Danlos syndrome.  Presents as rt medial epicondylitis, very mild lateral epicondylitis, and right thumb CMC joint arthritis.   TODAY'S TREATMENT:  02/19/24: OT looks at his braces with him, given self-care recommendations for which to wear in  which scenarios, as he has a thin brace, a CMC push brace, and a longer semirigid thumb spica brace.  OT also uses Kinesiotape with him as a flexible support that he can wear even when washing his hands at work and wear underneath gloves.  He states this feels supportive and helps his thumb feel better, but he was educated to take off after 2 or 3 days or if it was causing skin irritation.   Next, we review his home exercise program which she did need some review for.  We also added the bolded exercises below to help with dynamic thumb stability at the [facility] joint.  Additionally, we review epicondylitis pain and how to massage and stretch to improve that, and he was advised to not work out at the gym for at least another 2 to 3 weeks to give time for tendinitis to fully rest.  Lastly, as he states using a syringe is still painful for his thumb, OT shows him several other ways that he can do this without impacting his thumb as much.  He states he can try to incorporate these at work and see how it goes.    Exercises - Seated Wrist Flexion Stretch  - 3-4 x daily - 3 reps - 15 second  hold - Seated Wrist Extension Stretch  - 3-6 x daily - 3-5 reps - 15 hold - Stretch Thumb DOWNWARD  - 3 x daily - 3 reps - 15 sec hold - Thumb Webspace Stretch  - 3 x daily - 3 reps - 15 sec hold - Towel Roll Grip with Forearm in Neutral  - 3 x daily - 5 reps - 10 sec hold - Spread Index Finger Apart  - 3 x daily - 5 reps - 5 sec hold - C-Strength (try using rubber band)   - 3 x daily - 5 reps - 5 sec hold   PATIENT EDUCATION: Education details: See tx section above for details  Person educated: Patient Education method: Verbal Instruction, Teach back, Handouts  Education comprehension: States and demonstrates understanding, Additional Education required    HOME EXERCISE PROGRAM: Access Code: [email or web address] URL: [email or web address] Date: 02/06/2024 Prepared by: Fannie Knee    GOALS: Goals  reviewed with patient? Yes   SHORT TERM GOALS: (STG required if POC>30 days) Target Date: 02/23/2024  Pt will obtain protective, custom orthotic. Goal  status: TBD/PRN  2.  Pt will demo/state understanding of initial HEP to improve pain levels and prerequisite motion. Goal status: INITIAL   LONG TERM GOALS: Target Date: 03/22/24  Pt will improve functional ability by decreased impairment per PSFS assessment from 5 to 7 or better, for better quality of life. Goal status: INITIAL  2.  Pt will improve strength in right wrist flexion from tender 4/5 MMT to at least nontender 4+/5 MMT to have increased functional ability to carry out selfcare and higher-level homecare tasks with less difficulty. Goal status: INITIAL  3.  Pt will decrease pain at worst from "severe" to "mild" or better to have better sleep and occupational participation in daily roles. Goal status: INITIAL   ASSESSMENT:  CLINICAL IMPRESSION: 02/19/24: He is having less pain at the elbow, but thumb is still painful at times especially at work.  Hopefully new techniques and Kinesiotape helps support him to going forward.    PLAN:  OT FREQUENCY: 1-2x/week  OT DURATION: 6 weeks through 03/22/24 and up to 12 total visits as needed   PLANNED INTERVENTIONS: 97168 OT Re-evaluation, 97535 self care/ADL training, 40981 therapeutic exercise, 97530 therapeutic activity, 97112 neuromuscular re-education, 97140 manual therapy, 97035 ultrasound, 97039 fluidotherapy, 97010 moist heat, 97010 cryotherapy, 97033 iontophoresis, 97760 Orthotics management and training, 19147 Splinting (initial encounter), [id number] Subsequent splinting/medication, compression bandaging, Dry needling, energy conservation, coping strategies training, and patient/family education  RECOMMENDED OTHER SERVICES: none now   CONSULTED AND AGREED WITH PLAN OF CARE: Patient  PLAN FOR NEXT SESSION:  As needed for elbow issues, can still assign bicep/tricep stretches.   Consider eccentric training to help stretch the medial epicondyle as well.  Continue to monitor thumb/arthritis management.   Fannie Knee, OTR/L, CHT 02/19/2024, 4:51 PM

## 2024-02-15 ENCOUNTER — Other Ambulatory Visit (HOSPITAL_BASED_OUTPATIENT_CLINIC_OR_DEPARTMENT_OTHER): Payer: Self-pay

## 2024-02-15 ENCOUNTER — Ambulatory Visit (HOSPITAL_COMMUNITY): Payer: Commercial Managed Care - PPO | Admitting: Psychiatry

## 2024-02-19 ENCOUNTER — Ambulatory Visit: Admitting: Rehabilitative and Restorative Service Providers"

## 2024-02-19 ENCOUNTER — Encounter: Payer: Self-pay | Admitting: Rehabilitative and Restorative Service Providers"

## 2024-02-19 ENCOUNTER — Other Ambulatory Visit (HOSPITAL_BASED_OUTPATIENT_CLINIC_OR_DEPARTMENT_OTHER): Payer: Self-pay

## 2024-02-19 DIAGNOSIS — M25521 Pain in right elbow: Secondary | ICD-10-CM

## 2024-02-19 DIAGNOSIS — M25541 Pain in joints of right hand: Secondary | ICD-10-CM | POA: Diagnosis not present

## 2024-02-19 DIAGNOSIS — M6281 Muscle weakness (generalized): Secondary | ICD-10-CM

## 2024-02-20 ENCOUNTER — Other Ambulatory Visit (HOSPITAL_BASED_OUTPATIENT_CLINIC_OR_DEPARTMENT_OTHER): Payer: Self-pay

## 2024-02-20 DIAGNOSIS — L570 Actinic keratosis: Secondary | ICD-10-CM | POA: Diagnosis not present

## 2024-02-20 DIAGNOSIS — L821 Other seborrheic keratosis: Secondary | ICD-10-CM | POA: Diagnosis not present

## 2024-02-21 ENCOUNTER — Encounter: Payer: Self-pay | Admitting: Family Medicine

## 2024-02-21 ENCOUNTER — Ambulatory Visit: Admitting: Family Medicine

## 2024-02-21 ENCOUNTER — Other Ambulatory Visit: Payer: Self-pay

## 2024-02-21 VITALS — BP 130/90 | HR 73 | Ht 69.0 in | Wt 184.0 lb

## 2024-02-21 DIAGNOSIS — M79644 Pain in right finger(s): Secondary | ICD-10-CM

## 2024-02-21 NOTE — Progress Notes (Unsigned)
   I, Stevenson Clinch, CMA acting as a scribe for Jeremiah Graham, MD.  [name] is a 58 y.o. male who presents to [facility] today for cont'd R thumb pain. Pt was last seen by Dr. Denyse Amass on 01/18/24 and was advised to use voltaren gel and was referred to OT, completing 2 visits.  Today, pt reports continued pain in the right thumb. Taking Meloxicam with minimal relief. Denies worsening sx but also notes minimal improvement of sx. Compliant with HEP. ROM is limited. Notes intermittent swelling.   Dx testing: 01/12/24 R wrist XR & labs  Pertinent review of systems: No fevers or chills  Relevant historical information: Hypertension. History of seizure.  Exam:  BP (!) 130/90   Pulse 73   Ht 5\' 9"  (1.753 m)   Wt 184 lb (83.5 kg)   SpO2 99%   BMI 27.17 kg/m  General: Well Developed, well nourished, and in no acute distress.   MSK: Right thumb: Bossing first CMC.  Tender palpation first CMC.  Normal motion.    Lab and Radiology Results  Procedure: Real-time Ultrasound Guided Injection of the right first [facility] Device: [employer]ips Affiniti 50G/GE Logiq Images permanently stored and available for review in PACS Verbal informed consent obtained.  Discussed risks and benefits of procedure. Warned about infection, bleeding, hyperglycemia damage to structures among others. Patient expresses understanding and agreement Time-out conducted.   Noted no overlying erythema, induration, or other signs of local infection.   Skin prepped in a sterile fashion.   Local anesthesia: Topical Ethyl chloride.   With sterile technique and under real time ultrasound guidance: 40 mg of Kenalog and 1 mL of lidocaine injected into first CMC joint. Fluid seen entering the joint capsule.   Completed without difficulty   Pain immediately resolved suggesting accurate placement of the medication.   Advised to call if fevers/chills, erythema, induration, drainage, or persistent bleeding.    Images permanently stored and available for review in the ultrasound unit.  Impression: Technically successful ultrasound guided injection.         Assessment and Plan: 58 y.o. 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  Plan for steroid injection.  Continue OT.   PDMP not reviewed this encounter. Orders Placed This Encounter  Procedures   [place] LIMITED JOINT SPACE STRUCTURES UP RIGHT(NO LINKED CHARGES)    Reason for Exam (SYMPTOM  OR DIAGNOSIS REQUIRED):   right thumb pain    Preferred imaging location?:   [facility]   No orders of the defined types were placed in this encounter.    Discussed warning signs or symptoms. Please see discharge instructions. Patient expresses understanding.   The above documentation has been reviewed and is accurate and complete Jeremiah Anderson, M.D.

## 2024-02-21 NOTE — Patient Instructions (Signed)
 Thank you for coming in today.   Call or go to the ER if you develop a large red swollen joint with extreme pain or oozing puss.    Continue with Nate.   Let me know how it goes.

## 2024-02-22 ENCOUNTER — Telehealth (HOSPITAL_COMMUNITY): Payer: Self-pay | Admitting: *Deleted

## 2024-02-22 DIAGNOSIS — F902 Attention-deficit hyperactivity disorder, combined type: Secondary | ICD-10-CM

## 2024-02-22 MED ORDER — AMPHETAMINE-DEXTROAMPHET ER 20 MG PO CP24: 20.0000 mg | ORAL_CAPSULE | Freq: Two times a day (BID) | ORAL | 0 refills | Status: DC

## 2024-02-22 NOTE — Telephone Encounter (Signed)
 I sent his prescription but he need to pick up on his due date which is April 11.  No early refills.

## 2024-02-22 NOTE — Telephone Encounter (Signed)
 Writer LVM advising pt.

## 2024-02-22 NOTE — Telephone Encounter (Signed)
 Pt called requesting prescription for the Adderall XR 20 mg qam and at bedtime. Per MAR the last prescription was e-scribed on 01/30/24. Fill date for refill is 02/28/24. Pt has f/u scheduled for 03/04/24. Last pt visit was on 10/26/23. Please review and advise.

## 2024-02-22 NOTE — Telephone Encounter (Signed)
 Pt called back to say that he last filled the Adderall XR 20 mg on 01/30/24 and waiting to refill on 03/01/24 would actually be 32 days. Pt would like DNF date changed to 02/28/24. Please review. Thanks.

## 2024-02-25 ENCOUNTER — Other Ambulatory Visit: Payer: Self-pay | Admitting: Physician Assistant

## 2024-02-26 ENCOUNTER — Other Ambulatory Visit: Payer: Self-pay

## 2024-02-26 ENCOUNTER — Other Ambulatory Visit (HOSPITAL_BASED_OUTPATIENT_CLINIC_OR_DEPARTMENT_OTHER): Payer: Self-pay

## 2024-02-26 MED ORDER — ROSUVASTATIN CALCIUM 20 MG PO TABS
20.0000 mg | ORAL_TABLET | Freq: Every day | ORAL | 1 refills | Status: DC
Start: 2024-02-26 — End: 2024-09-08
  Filled 2024-02-26: qty 90, 90d supply, fill #0
  Filled 2024-06-13: qty 90, 90d supply, fill #1

## 2024-02-28 ENCOUNTER — Ambulatory Visit: Admitting: Rehabilitative and Restorative Service Providers"

## 2024-02-28 ENCOUNTER — Encounter: Payer: Self-pay | Admitting: Rehabilitative and Restorative Service Providers"

## 2024-02-28 DIAGNOSIS — M25541 Pain in joints of right hand: Secondary | ICD-10-CM | POA: Diagnosis not present

## 2024-02-28 DIAGNOSIS — M6281 Muscle weakness (generalized): Secondary | ICD-10-CM | POA: Diagnosis not present

## 2024-02-28 DIAGNOSIS — M25521 Pain in right elbow: Secondary | ICD-10-CM | POA: Diagnosis not present

## 2024-02-28 NOTE — Therapy (Addendum)
 OUTPATIENT OCCUPATIONAL THERAPY TREATMENT & DISCHARGE NOTE  Patient Name: Jeremiah Anderson MRN: [id number] DOB:1966-11-15, 58 y.o., male Today's Date: 02/28/2024  PCP: Allwardt, A. PA-C REFERRING PROVIDER:  Joane Artist RAMAN, MD                  OCCUPATIONAL THERAPY DISCHARGE SUMMARY  Visits from Start of Care: 3  Pt did not return to OT and goals could not be finalized.  The patient  is officially D/C therapy today per protocols. See note for additional details.   Melvenia Ada, OTR/L, CHT 09/20/24              END OF SESSION:  OT End of Session - 02/28/24 1605     Visit Number 3    Number of Visits 12    Date for OT Re-Evaluation 03/22/24    Authorization Type [employer]    OT Start Time 1605    OT Stop Time 1648    OT Time Calculation (min) 43 min    Equipment Utilized During Treatment --    Activity Tolerance Patient tolerated treatment well;No increased pain;Patient limited by pain    Behavior During Therapy [facility] for tasks assessed/performed               Past Medical History:  Diagnosis Date   Allergy    Arthritis    Blood transfusion without reported diagnosis    around age 83 after major nausea secondary to prescribed MS Contin    GERD (gastroesophageal reflux disease)    Hyperlipidemia    Hypertension    Migraines    Wears glasses    Past Surgical History:  Procedure Laterality Date   APPENDECTOMY  1980s   post op bleeding sent him back for ex lap and hemostatic intervention.    BACK SURGERY     8 surgeries total - fused from L1-S1   COLONOSCOPY     Age 70. For bleeding   ESOPHAGOGASTRODUODENOSCOPY N/A 06/10/2015   Procedure: ESOPHAGOGASTRODUODENOSCOPY (EGD);  Surgeon: Princella CHRISTELLA Nida, MD;  Location: [facility] ENDOSCOPY;  Service: Endoscopy;  Laterality: N/A;   ex lap with peri gastric vessel ligation  1990s   after retching, he rupture a blood vessel outside the stomach.  vessel was ligated per pt's descriiption.    INCISIONAL HERNIA  REPAIR Right 10/21/2016   Procedure: OPEN FLANK HERNIA REPAIR WITH MESH;  Surgeon: Lynda Leos, MD;  Location: [facility] OR;  Service: General;  Laterality: Right;   INSERTION OF MESH Right 10/21/2016   Procedure: INSERTION OF MESH;  Surgeon: Lynda Leos, MD;  Location: [facility] OR;  Service: General;  Laterality: Right;   KNEE SURGERY     Multiple bilateral scopes   RETINAL DETACHMENT SURGERY Right    SHOULDER SURGERY     4x on the left - total shoulder replacement.    SHOULDER SURGERY Left    x2   TOTAL SHOULDER ARTHROPLASTY Left    x2   Patient Active Problem List   Diagnosis Date Noted   Palpable mass of lower back 07/21/2023   Encounter for annual physical exam 07/21/2023   Spinal stenosis, cervical region 03/15/2023   Chronic left shoulder pain 12/22/2022   Meningioma (HCC) 10/07/2022   Seizure (HCC) 10/07/2022   Testosterone  deficiency 11/04/2020   Vitamin D  deficiency 11/04/2020   Attention deficit hyperactivity disorder (ADHD), combined type 11/04/2020   S/P foot surgery, right 10/20/2020   Morton's neuroma of right foot 08/05/2020   Elevated LFTs 03/23/2020   Fusion of spine  of lumbar region 10/31/2019   Spondylolysis of cervical region 10/31/2019   Chronic, continuous use of opioids 07/17/2019   Facet arthropathy, cervical 07/17/2019   Neck pain 12/17/2017   Patellar tendinitis of right knee 08/18/2017   Medicare annual wellness visit, subsequent 01/27/2017   Abscess of nasal cavity 06/14/2016   Right flank mass 06/14/2016   Tobacco use disorder 06/14/2016   Overweight (BMI 25.0-29.9) 06/14/2016   Sinusitis, chronic 08/12/2015   Arthralgia 07/06/2015   Chronic back pain 06/10/2015   Multiple duodenal ulcers 06/10/2015   Hyperlipidemia 05/07/2015   Migraines 04/21/2015   Essential hypertension 02/16/2015   Impingement syndrome of shoulder 08/07/2014   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    ONSET DATE: approx 5 month onset Rt thumb pain and Rt arm pain  REFERRING  DIAG: M79.644 (ICD-10-CM) - Thumb pain, right   THERAPY DIAG:  Pain in joint of right hand  Muscle weakness (generalized)  Pain in right elbow  Rationale for Evaluation and Treatment: Rehabilitation  PERTINENT HISTORY: Over 20 different surgeries to knees, left shoulder, spine, and other areas of his body, somewhat hyperactive in nature, also was at the lead in college, recently recovering from left arm weakness after cervical fusion, describes a history of laxity in his connective tissues He got here a little late, states history of over 20 surgeries, a lot of comorbidities including cervical fusions which caused his Lt arm to go numb. He had some flaccid weakness, was getting therapy in PT for Lt arm, states jamming his right thumb and also developing pain at both epicondyles in the right arm from possibly overusing his arm.  These issues have been bothering him for 5 months, with slight improvement in epicondyle pain.  He had EMG that showed CTS in Lt arm but no nerve impingements in Rt arm.  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   PRECAUTIONS: Cervical  RED FLAGS: None   WEIGHT BEARING RESTRICTIONS: No   SUBJECTIVE:   SUBJECTIVE STATEMENT: He states losing his rubber bands.  He also got thumb injection and his thumb is feeling better, but his Lt thumb is now hurting- he thinks it's from compensation. Medial epicondyle is night and day different    PAIN:  Are you having pain? Yes: NPRS scale: rates resting pain 1/10 Rt thumb, 2/10 in Lt thumb, and medial epicondyle is night and day different Pain location: Right thumb basal joint and right medial epicondyle Pain description: Aching and sore Aggravating factors: Weightbearing, wrist flexion, repetitive gripping Relieving factors: Rest and heat modalities   PATIENT GOALS: To decrease pain and symptoms in right hand/thumb as well as elbow medial epicondyle  NEXT MD VISIT: As needed   OBJECTIVE: (All objective  assessments below are from initial evaluation on: 02/06/24 unless otherwise specified.)   HAND DOMINANCE: Right   ADLs: Overall ADLs: States decreased ability to grab, hold household objects, pain and difficulty to open containers, perform FMS tasks   FUNCTIONAL OUTCOME MEASURES: 02/28/24: PSFS: 7  Eval: Patient Specific Functional Scale: 5 (drawing labs, opening jars, nursing duties)  (Higher Score  =  Better Ability for the Selected Tasks)      UPPER EXTREMITY ROM     Shoulder to Wrist AROM Right eval  Shoulder flexion   Shoulder abduction   Shoulder extension   Shoulder internal rotation   Shoulder external rotation   Elbow flexion full  Elbow extension full  Forearm supination full  Forearm pronation  full  Wrist flexion 84  Wrist extension  80  (Blank rows = not tested)   Hand AROM Right eval  Full Fist Ability (or Gap to Distal Palmar Crease) full  Thumb Opposition  (Kapandji Scale)  10/10  (Blank rows = not tested)   UPPER EXTREMITY MMT:     MMT Right 02/06/2024 Rt 02/28/24  Elbow flexion 4+/5   Elbow extension 4+/5   Forearm supination    Forearm pronation    Wrist flexion 4/5 tender to medial epicondyle 4+/5 non-tender  Wrist extension 4+/5 only slightly tender to lateral epicondyle 5/5  Wrist ulnar deviation    Wrist radial deviation    (Blank rows = not tested)  HAND FUNCTION: 02/28/24: Grip Rt: 88#, Lt: 63#    Eval: No significant observed weakness in affected right hand, though mild pain present.  Grip strength Right: 96 lbs, Left: 61 lbs   EDEMA:   Eval: Some mild swelling in the right thumb CMC joint as well as the MCP joints of bilateral hands digits 2 and 3   OBSERVATIONS:   Eval: positive resisted wrist flexion, TTP to medial epicondyle and flexor mass,  less so to lateral side.   TTP to Rt thumb basal joint and sore and pain with motion.  Interestingly hyperflexible with a possible history of laxity issues similar to Ehlers-Danlos  syndrome.  Presents as rt medial epicondylitis, very mild lateral epicondylitis, and right thumb CMC joint arthritis.   TODAY'S TREATMENT:  02/28/24: OT continues to educate and discuss arthritis management with him in terms of work and home functional activities.  He rates his ability significantly improved since the start of care per the PSFS.  He describes pain with exercise routines and push-up positions, and is again advised to get some type of supportive  workout glove.  OT reviews his home exercises with him and does add stretch for radial abduction of the thumb, and we also discussed his medial epicondylitis symptoms which are much improved.  OT adds an eccentric bicep strengthening to be performed 2 or 3 times a day, a few days of the week, to help strengthen and stretch this area simultaneously.  He does this with 4 pound weight today and red therapy band and states having no pain and tolerating it well.  For new and emerging left thumb pain which does present as CMC joint arthritis, OT again advises to do the stretches for the right hand on the left hand as well as needed.   Exercises - Seated Wrist Flexion Stretch  - 3-4 x daily - 3 reps - 15 second  hold - Seated Wrist Extension Stretch  - 3-6 x daily - 3-5 reps - 15 hold - Stretch Thumb DOWNWARD  - 3 x daily - 3 reps - 15 sec hold - Thumb Webspace Stretch  - 3 x daily - 3 reps - 15 sec hold - Thumb PROM Composite Extension  - 2-3 x daily - 3 reps - 15 sec hold - Towel Roll Grip with Forearm in Neutral  - 3 x daily - 5 reps - 10 sec hold - Spread Index Finger Apart  - 3 x daily - 5 reps - 5 sec hold - C-Strength (try using rubber band)   - 3 x daily - 5 reps - 5 sec hold - Eccentric Bicep Strength  - 2-4 x daily - 5-10 reps - 10 sec hold   PATIENT EDUCATION: Education details: See tx section above for details  Person educated: Patient Education method: Verbal Instruction, Teach back, Handouts  Education comprehension: States and  demonstrates understanding, Additional Education required    HOME EXERCISE PROGRAM: Access Code: [email or web address] URL: [email or web address] Date: 02/06/2024 Prepared by: Melvenia Ada    GOALS: Goals reviewed with patient? Yes   SHORT TERM GOALS: (STG required if POC>30 days) Target Date: 02/23/2024  Pt will obtain protective, custom orthotic. Goal status: TBD/PRN  2.  Pt will demo/state understanding of initial HEP to improve pain levels and prerequisite motion. Goal status: INITIAL   LONG TERM GOALS: Target Date: 03/22/24  Pt will improve functional ability by decreased impairment per PSFS assessment from 5 to 7 or better, for better quality of life. Goal status: 02/28/24: MET  2.  Pt will improve strength in right wrist flexion from tender 4/5 MMT to at least nontender 4+/5 MMT to have increased functional ability to carry out selfcare and higher-level homecare tasks with less difficulty. Goal status: 02/28/24: MET  3.  Pt will decrease pain at worst from severe to mild or better to have better sleep and occupational participation in daily roles. Goal status: INITIAL   ASSESSMENT:  CLINICAL IMPRESSION: 02/28/24: He has now met most of his goals for therapy, and his pain is low, he rates his functional ability better, and his medial epicondyle is no longer painful at all,  but he would like to return again in about 2 weeks for his last scheduled appointment to ensure that his management strategies are working well.  OT is fine with this, but does tell him that he could cancel this appointment if he has no significant exacerbation.   PLAN:  OT FREQUENCY: 1-2x/week  OT DURATION: 6 weeks through 03/22/24 and up to 12 total visits as needed   PLANNED INTERVENTIONS: 97168 OT Re-evaluation, 97535 self care/ADL training, 02889 therapeutic exercise, 97530 therapeutic activity, 97112 neuromuscular re-education, 97140 manual therapy, 97035 ultrasound, 97039 fluidotherapy,  97010 moist heat, 97010 cryotherapy, 97033 iontophoresis, 97760 Orthotics management and training, 02239 Splinting (initial encounter), [id number] Subsequent splinting/medication, compression bandaging, Dry needling, energy conservation, coping strategies training, and patient/family education  RECOMMENDED OTHER SERVICES: none now   CONSULTED AND AGREED WITH PLAN OF CARE: Patient  PLAN FOR NEXT SESSION:  See him back on 03/12/2024 as scheduled, unless he is managing well in which case, he could cancel this appointment   Melvenia Ada, OTR/L, CHT 02/28/2024, 5:07 PM

## 2024-03-04 ENCOUNTER — Other Ambulatory Visit: Payer: Self-pay

## 2024-03-05 ENCOUNTER — Encounter: Admitting: Rehabilitative and Restorative Service Providers"

## 2024-03-07 ENCOUNTER — Encounter (HOSPITAL_COMMUNITY): Payer: Self-pay

## 2024-03-07 ENCOUNTER — Encounter (HOSPITAL_COMMUNITY): Payer: Self-pay | Admitting: Psychiatry

## 2024-03-07 ENCOUNTER — Ambulatory Visit (HOSPITAL_COMMUNITY): Admitting: Psychiatry

## 2024-03-07 DIAGNOSIS — F902 Attention-deficit hyperactivity disorder, combined type: Secondary | ICD-10-CM

## 2024-03-07 MED ORDER — AMPHETAMINE-DEXTROAMPHET ER 20 MG PO CP24: 20.0000 mg | ORAL_CAPSULE | Freq: Two times a day (BID) | ORAL | 0 refills | Status: DC

## 2024-03-07 NOTE — Progress Notes (Signed)
 [facility] MD/PA/NP OP Progress Note   Patient location; office Provider location; office  03/07/2024 1:42 PM Jeremiah Anderson  MRN:  [id number]  Chief Complaint:  Chief Complaint  Patient presents with   Follow-up   Medication Refill   HPI: Patient came for his follow-up appointment.  He is taking his medication as prescribed and he reported job is going well and he is very productive.  He was recently nominated for [employer] and he was happy about it.  Denies any dizziness, palpitation, panic attacks.  He sleeps good.  His appetite is okay.  He denies drinking or using any illegal substances.  He takes Adderall 20 mg twice a day most of the days but there are days when he does not work he tried to skip.  He has not contact his sister and thought about writing the letter but never actually did.  He does talk to his mother but sometimes the relationship gets very complicated so he tried to avoid to discuss with mother about sister.  He had a good relationship with his girlfriend.  His appetite is okay.  Weight is unchanged from the past.  Recently had a blood work and labs are okay.  He has chronic back pain and neck pain and he feels they are chronic but stable.  Visit Diagnosis:    ICD-10-CM   1. Attention deficit hyperactivity disorder (ADHD), combined type  F90.2 amphetamine-dextroamphetamine (ADDERALL XR) 20 MG 24 hr capsule      Past Psychiatric History: Reviewed. H/O ADHD diagnosed in 2005.  Took Adderall until 2013 but stopped after taking pain medication. 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  H/O suicidal thoughts when going through withdrawals but no attempts. No H/O mania.  Had tried Adderall for a while and then recently Vyvanse but had GI side effects.  Strattera did not help.  PCP tried trazodone for sleep S/P brain surgery. H/O alcohol on and off but no current use. No history of cocaine or any IV drugs.   Past Medical History:  Past Medical  History:  Diagnosis Date   Allergy    Arthritis    Blood transfusion without reported diagnosis    around age 49 after major nausea secondary to prescribed MS Contin   GERD (gastroesophageal reflux disease)    Hyperlipidemia    Hypertension    Migraines    Wears glasses     Past Surgical History:  Procedure Laterality Date   APPENDECTOMY  1980s   post op bleeding sent him back for ex lap and hemostatic intervention.    BACK SURGERY     8 surgeries total - fused from L1-S1   COLONOSCOPY     Age 48. For bleeding   ESOPHAGOGASTRODUODENOSCOPY N/A 06/10/2015   Procedure: ESOPHAGOGASTRODUODENOSCOPY (EGD);  Surgeon: Hart Carwin, MD;  Location: [facility] ENDOSCOPY;  Service: Endoscopy;  Laterality: N/A;   ex lap with peri gastric vessel ligation  1990s   after retching, he rupture a blood vessel "outside" the stomach.  vessel was ligated per pt's descriiption.    INCISIONAL HERNIA REPAIR Right 10/21/2016   Procedure: OPEN FLANK HERNIA REPAIR WITH MESH;  Surgeon: Axel Filler, MD;  Location: [facility] OR;  Service: General;  Laterality: Right;   INSERTION OF MESH Right 10/21/2016   Procedure: INSERTION OF MESH;  Surgeon: Axel Filler, MD;  Location: [facility] OR;  Service: General;  Laterality: Right;   KNEE SURGERY     Multiple bilateral scopes  RETINAL DETACHMENT SURGERY Right    SHOULDER SURGERY     4x on the left - total shoulder replacement.    SHOULDER SURGERY Left    x2   TOTAL SHOULDER ARTHROPLASTY Left    x2    Family Psychiatric History: Reviewed.  Family History:  Family History  Problem Relation Age of Onset   Arthritis Mother    Hyperlipidemia Mother    Hypertension Father    Cancer Father    Skin cancer Father    Colon cancer Maternal Grandmother    Cancer Maternal Grandmother    Hypertension Sister    Hypertension Paternal Grandmother    Heart attack Paternal Grandfather    Heart disease Paternal Grandfather    Diabetes Neg Hx    Stroke Neg Hx    Prostate cancer Neg Hx      Social History:  Social History   Socioeconomic History   Marital status: Single    Spouse name: Not on file   Number of children: 0   Years of education: 16   Highest education level: Bachelor's degree (e.g., BA, AB, BS)  Occupational History   Occupation: Disability    Comment: Former Charity fundraiser  Tobacco Use   Smoking status: Former    Types: Cigarettes   Smokeless tobacco: Never  Vaping Use   Vaping status: Never Used  Substance and Sexual Activity   Alcohol use: Not Currently    Alcohol/week: 0.0 standard drinks of alcohol   Drug use: No   Sexual activity: Not on file  Other Topics Concern   Not on file  Social History Narrative   Fun: play guitar / limited funds      Denies any religious beliefs effecting health care.    Social Drivers of Corporate investment banker Strain: Low Risk  (04/24/2023)   Overall Financial Resource Strain (CARDIA)    Difficulty of Paying Living Expenses: Not very hard  Food Insecurity: No Food Insecurity (04/24/2023)   Hunger Vital Sign    Worried About Running Out of Food in the Last Year: Never true    Ran Out of Food in the Last Year: Never true  Transportation Needs: No Transportation Needs (04/24/2023)   PRAPARE - Administrator, Civil Service (Medical): No    Lack of Transportation (Non-Medical): No  Physical Activity: Insufficiently Active (04/24/2023)   Exercise Vital Sign    Days of Exercise per Week: 3 days    Minutes of Exercise per Session: 30 min  Stress: Stress Concern Present (04/24/2023)   [employer] of Occupational Health - Occupational Stress Questionnaire    Feeling of Stress : To some extent  Social Connections: Unknown (04/24/2023)   Social Connection and Isolation Panel [NHANES]    Frequency of Communication with Friends and Family: Once a week    Frequency of Social Gatherings with Friends and Family: Once a week    Attends Religious Services: Patient declined    Database administrator or Organizations:  No    Attends Banker Meetings: Never    Marital Status: Living with partner    Allergies:  Allergies  Allergen Reactions   Fentanyl Other (See Comments)    Hallucinations (with the patch)  Other Reaction(s): Other (See Comments)  Hears voice   Fentanyl Patch   Lisinopril Swelling   Lyrica [Pregabalin]     hallucinations   Metoprolol Other (See Comments)   Sulfa Antibiotics Swelling    Metabolic Disorder Labs: Lab Results  Component Value Date   HGBA1C 5.6 07/21/2023   No results found for: "PROLACTIN" Lab Results  Component Value Date   CHOL 160 07/21/2023   TRIG 121.0 07/21/2023   HDL 56.90 07/21/2023   CHOLHDL 3 07/21/2023   VLDL 24.2 07/21/2023   LDLCALC 79 07/21/2023   LDLCALC 80 04/11/2022   Lab Results  Component Value Date   TSH 1.31 07/21/2023   TSH 0.93 11/04/2021    Therapeutic Level Labs: No results found for: "LITHIUM" No results found for: "VALPROATE" No results found for: "CBMZ"  Current Medications: Current Outpatient Medications  Medication Sig Dispense Refill   amLODipine (NORVASC) 10 MG tablet Take 1 tablet (10 mg total) by mouth daily. 90 tablet 1   amphetamine-dextroamphetamine (ADDERALL XR) 20 MG 24 hr capsule Take 1 capsule (20 mg total) by mouth in the morning and at bedtime. 60 capsule 0   butalbital-acetaminophen-caffeine (BAC) 50-325-40 MG tablet Take 1 tablet by mouth every 6 (six) hours as needed. 120 tablet 1   Cholecalciferol (VITAMIN D3) 25 MCG (1000 UT) CAPS Take by mouth daily.     Coenzyme Q10 (COQ10) 100 MG CAPS Take 100 mg by mouth daily.     cyanocobalamin 100 MCG tablet Take 100 mcg by mouth daily.     fluticasone (FLONASE) 50 MCG/ACT nasal spray Place 2 sprays into both nostrils daily. 16 g 11   Lidocaine 4 % PTCH Place 1-2 patches onto the skin 2 (two) times daily as needed (for pain.). Salonpas Maximum Strength Pain Relieving Gel-Patch     meloxicam (MOBIC) 15 MG tablet Take 1 tablet (15 mg total) by  mouth daily as needed. 30 tablet 2   morphine (MSIR) 30 MG tablet Take 30 mg by mouth every 4 (four) hours as needed.     Multiple Vitamin (MULTIVITAMIN WITH MINERALS) TABS tablet Take 1 tablet by mouth daily.     naloxone (NARCAN) nasal spray 4 mg/0.1 mL Place into the nose.     Omega-3 Fatty Acids (OMEGA 3 PO) Take 120 mg by mouth daily.     ondansetron (ZOFRAN) 4 MG tablet TAKE 1 TABLET BY MOUTH EVERY 8 HOURS AS NEEDED FOR NAUSEA AND VOMITING 30 tablet 3   rosuvastatin (CRESTOR) 20 MG tablet Take 1 tablet (20 mg total) by mouth daily. 90 tablet 1   SUMAtriptan (IMITREX) 100 MG tablet MAY REPEAT IN 2 HOURS IF HEADACHE PERSISTS OR RECURS. 9 tablet 3   tazarotene (TAZORAC) 0.1 % gel APPLY A SMALL AMOUNT TO AFFECTED AREA AT BEDTIME     testosterone cypionate (DEPOTESTOSTERONE CYPIONATE) 200 MG/ML injection Inject 0.5 mLs (100 mg total) into the muscle once a week. 10 mL 1   No current facility-administered medications for this visit.    Psychiatric Specialty Exam: Physical Exam  Review of Systems  Musculoskeletal:  Positive for back pain and neck pain.  Psychiatric/Behavioral:  The patient is nervous/anxious.     Blood pressure (!) 165/98, pulse (!) 105, resp. rate 20, weight 197 lb 12.8 oz (89.7 kg).There is no height or weight on file to calculate BMI.  General Appearance: Casual  Eye Contact:  Good  Speech:  Slow  Volume:  Normal  Mood:  Anxious  Affect:  Congruent  Thought Process:  Descriptions of Associations: Intact  Orientation:  Full (Time, Place, and Person)  Thought Content:  Logical  Suicidal Thoughts:  No  Homicidal Thoughts:  No  Memory:  Immediate;   Good Recent;   Good Remote;   Good  Judgement:  Intact  Insight:  Present  Psychomotor Activity:  Increased, Restlessness, and fidgety  Concentration:  Concentration: distracted and Attention Span: Fair  Recall:  Good  Fund of Knowledge:  Good  Language:  Good  Akathisia:  No  Handed:  Right  AIMS (if indicated):      Assets:  Communication Skills Desire for Improvement Housing Resilience Social Support Talents/Skills Transportation  ADL's:  Intact  Cognition:  WNL  Sleep:   ok       Screenings: GAD-7    Loss adjuster, chartered Office Visit from 08/24/2023 in [facility] from 07/21/2023 in [facility] from 12/22/2022 in [facility]  Total GAD-7 Score 2 1 2       [employer]    Flowsheet Row Office Visit from 08/24/2023 in [facility] from 07/21/2023 in [facility] Clinical Support from 04[facility]25/2023 in [facility] from 12/22/2022 in [facility] from 12/15/2022 in [facility]  PHQ-2 Total Score 1 2 0 1 1  PHQ-9 Total Score 6 5 -- 3 5      Flowsheet Row Office Visit from 12/15/2022 in [facility]  C-SSRS RISK CATEGORY Error: Question 6 not populated        Assessment and Plan: ADHD, combined type  Patient is stable on his current medication.  Reviewed blood work results.  Continue Adderall XR 20 mg at 4 AM and 8 PM before he go to work.  He usually takes medicine when he goes to work.  Discussed medication side effects and benefits.  Discussed stimulant abuse, tolerance, withdrawal and dependency.  Recommend to call us  back if is any question or any concern.  Follow-up in 3 months.    Collaboration of Care: Collaboration of Care: Other provider involved in patient's care AEB notes are available in epic to review.  Patient/Guardian was advised Release of Information must be obtained prior to any record release in order to collaborate their care with an outside provider. Patient/Guardian was advised if they  have not already done so to contact the registration department to sign all necessary forms in order for us  to release information regarding their care.   Consent: Patient/Guardian gives verbal consent for treatment and assignment of benefits for services provided during this visit. Patient/Guardian expressed understanding and agreed to proceed.   I provided 20 minutes face-to-face time during this encounter.  Arturo Late, MD 03/07/2024, 1:42 PM

## 2024-03-11 ENCOUNTER — Encounter: Admitting: Rehabilitative and Restorative Service Providers"

## 2024-03-12 ENCOUNTER — Encounter: Admitting: Rehabilitative and Restorative Service Providers"

## 2024-03-19 DIAGNOSIS — M47818 Spondylosis without myelopathy or radiculopathy, sacral and sacrococcygeal region: Secondary | ICD-10-CM | POA: Diagnosis not present

## 2024-03-19 DIAGNOSIS — R519 Headache, unspecified: Secondary | ICD-10-CM | POA: Diagnosis not present

## 2024-03-19 DIAGNOSIS — Z79891 Long term (current) use of opiate analgesic: Secondary | ICD-10-CM | POA: Diagnosis not present

## 2024-03-19 DIAGNOSIS — M62830 Muscle spasm of back: Secondary | ICD-10-CM | POA: Diagnosis not present

## 2024-03-19 DIAGNOSIS — M25559 Pain in unspecified hip: Secondary | ICD-10-CM | POA: Diagnosis not present

## 2024-03-19 DIAGNOSIS — M4125 Other idiopathic scoliosis, thoracolumbar region: Secondary | ICD-10-CM | POA: Diagnosis not present

## 2024-03-19 DIAGNOSIS — M4722 Other spondylosis with radiculopathy, cervical region: Secondary | ICD-10-CM | POA: Diagnosis not present

## 2024-03-19 DIAGNOSIS — M961 Postlaminectomy syndrome, not elsewhere classified: Secondary | ICD-10-CM | POA: Diagnosis not present

## 2024-03-19 DIAGNOSIS — M150 Primary generalized (osteo)arthritis: Secondary | ICD-10-CM | POA: Diagnosis not present

## 2024-03-19 DIAGNOSIS — M25551 Pain in right hip: Secondary | ICD-10-CM | POA: Diagnosis not present

## 2024-03-19 DIAGNOSIS — G894 Chronic pain syndrome: Secondary | ICD-10-CM | POA: Diagnosis not present

## 2024-04-16 ENCOUNTER — Other Ambulatory Visit (HOSPITAL_COMMUNITY): Payer: Self-pay

## 2024-04-16 ENCOUNTER — Other Ambulatory Visit (HOSPITAL_BASED_OUTPATIENT_CLINIC_OR_DEPARTMENT_OTHER): Payer: Self-pay

## 2024-04-16 DIAGNOSIS — Z79891 Long term (current) use of opiate analgesic: Secondary | ICD-10-CM | POA: Diagnosis not present

## 2024-04-16 DIAGNOSIS — M62830 Muscle spasm of back: Secondary | ICD-10-CM | POA: Diagnosis not present

## 2024-04-16 DIAGNOSIS — G894 Chronic pain syndrome: Secondary | ICD-10-CM | POA: Diagnosis not present

## 2024-04-16 DIAGNOSIS — M47818 Spondylosis without myelopathy or radiculopathy, sacral and sacrococcygeal region: Secondary | ICD-10-CM | POA: Diagnosis not present

## 2024-04-16 MED ORDER — BUTALBITAL-APAP-CAFFEINE 50-325-40 MG PO TABS
ORAL_TABLET | ORAL | 1 refills | Status: DC
  Filled 2024-04-16 – 2024-04-22 (×2): qty 120, 30d supply, fill #0
  Filled 2024-05-20: qty 120, 30d supply, fill #1

## 2024-04-22 ENCOUNTER — Telehealth (HOSPITAL_COMMUNITY): Payer: Self-pay

## 2024-04-22 ENCOUNTER — Other Ambulatory Visit (HOSPITAL_BASED_OUTPATIENT_CLINIC_OR_DEPARTMENT_OTHER): Payer: Self-pay

## 2024-04-22 DIAGNOSIS — F902 Attention-deficit hyperactivity disorder, combined type: Secondary | ICD-10-CM

## 2024-04-22 MED ORDER — AMPHETAMINE-DEXTROAMPHET ER 20 MG PO CP24: 20.0000 mg | ORAL_CAPSULE | Freq: Two times a day (BID) | ORAL | 0 refills | Status: DC

## 2024-04-22 NOTE — Telephone Encounter (Signed)
 Done

## 2024-04-22 NOTE — Telephone Encounter (Signed)
 Patient is calling for a refill to be sent to the pharmacy for his Adderall , last sent on 4/19, patient has a follow up on 7/24 and he uses [facility]. Please review and advise, thank you

## 2024-04-25 ENCOUNTER — Ambulatory Visit: Payer: Commercial Managed Care - PPO

## 2024-04-30 ENCOUNTER — Encounter: Payer: Commercial Managed Care - PPO | Admitting: Physician Assistant

## 2024-05-07 ENCOUNTER — Encounter: Payer: Commercial Managed Care - PPO | Admitting: Physician Assistant

## 2024-05-07 ENCOUNTER — Other Ambulatory Visit: Payer: Self-pay | Admitting: Physician Assistant

## 2024-05-12 ENCOUNTER — Other Ambulatory Visit: Payer: Self-pay | Admitting: Physician Assistant

## 2024-05-13 ENCOUNTER — Other Ambulatory Visit (HOSPITAL_BASED_OUTPATIENT_CLINIC_OR_DEPARTMENT_OTHER): Payer: Self-pay

## 2024-05-13 MED ORDER — AMLODIPINE BESYLATE 10 MG PO TABS
10.0000 mg | ORAL_TABLET | Freq: Every day | ORAL | 1 refills | Status: DC
  Filled 2024-05-13: qty 90, 90d supply, fill #0
  Filled 2024-08-07: qty 90, 90d supply, fill #1

## 2024-05-14 DIAGNOSIS — M47818 Spondylosis without myelopathy or radiculopathy, sacral and sacrococcygeal region: Secondary | ICD-10-CM | POA: Diagnosis not present

## 2024-05-14 DIAGNOSIS — M62830 Muscle spasm of back: Secondary | ICD-10-CM | POA: Diagnosis not present

## 2024-05-14 DIAGNOSIS — G894 Chronic pain syndrome: Secondary | ICD-10-CM | POA: Diagnosis not present

## 2024-05-14 DIAGNOSIS — Z79891 Long term (current) use of opiate analgesic: Secondary | ICD-10-CM | POA: Diagnosis not present

## 2024-05-16 ENCOUNTER — Other Ambulatory Visit (HOSPITAL_BASED_OUTPATIENT_CLINIC_OR_DEPARTMENT_OTHER): Payer: Self-pay

## 2024-05-16 ENCOUNTER — Telehealth (HOSPITAL_COMMUNITY): Payer: Self-pay

## 2024-05-16 DIAGNOSIS — F902 Attention-deficit hyperactivity disorder, combined type: Secondary | ICD-10-CM

## 2024-05-16 NOTE — Telephone Encounter (Signed)
 Patient is requesting a refill on Adderall  - he is not due until 7/2 and is aware you will not be sending it in until next week. He was last seen on 4/17 and has a follow up on 7/24. Please review and advise, thank you

## 2024-05-20 ENCOUNTER — Other Ambulatory Visit: Payer: Self-pay

## 2024-05-20 MED ORDER — AMPHETAMINE-DEXTROAMPHET ER 20 MG PO CP24: 20.0000 mg | ORAL_CAPSULE | Freq: Two times a day (BID) | ORAL | 0 refills | Status: DC

## 2024-05-20 NOTE — Telephone Encounter (Signed)
 I sent his prescription and he will pick up on his due date.

## 2024-06-04 ENCOUNTER — Ambulatory Visit

## 2024-06-11 ENCOUNTER — Other Ambulatory Visit (HOSPITAL_BASED_OUTPATIENT_CLINIC_OR_DEPARTMENT_OTHER): Payer: Self-pay

## 2024-06-11 DIAGNOSIS — Z79891 Long term (current) use of opiate analgesic: Secondary | ICD-10-CM | POA: Diagnosis not present

## 2024-06-11 DIAGNOSIS — M62830 Muscle spasm of back: Secondary | ICD-10-CM | POA: Diagnosis not present

## 2024-06-11 DIAGNOSIS — G894 Chronic pain syndrome: Secondary | ICD-10-CM | POA: Diagnosis not present

## 2024-06-11 DIAGNOSIS — M47818 Spondylosis without myelopathy or radiculopathy, sacral and sacrococcygeal region: Secondary | ICD-10-CM | POA: Diagnosis not present

## 2024-06-11 MED ORDER — BUTALBITAL-APAP-CAFFEINE 50-325-40 MG PO TABS
1.0000 | ORAL_TABLET | Freq: Four times a day (QID) | ORAL | 1 refills | Status: DC | PRN
  Filled 2024-06-17 (×2): qty 120, 30d supply, fill #0
  Filled 2024-07-13: qty 120, 30d supply, fill #1

## 2024-06-13 ENCOUNTER — Other Ambulatory Visit: Payer: Self-pay

## 2024-06-13 ENCOUNTER — Encounter (HOSPITAL_COMMUNITY): Payer: Self-pay | Admitting: Psychiatry

## 2024-06-13 ENCOUNTER — Ambulatory Visit (HOSPITAL_COMMUNITY): Admitting: Psychiatry

## 2024-06-13 DIAGNOSIS — F902 Attention-deficit hyperactivity disorder, combined type: Secondary | ICD-10-CM | POA: Diagnosis not present

## 2024-06-13 MED ORDER — AMPHETAMINE-DEXTROAMPHET ER 20 MG PO CP24: 20.0000 mg | ORAL_CAPSULE | Freq: Two times a day (BID) | ORAL | 0 refills | Status: DC

## 2024-06-13 NOTE — Progress Notes (Signed)
 [facility] MD/PA/NP OP Progress Note   Patient location; office Provider location; office  06/13/2024 10:35 AM Jeremiah Anderson  MRN:  [id number]  Chief Complaint:  Chief Complaint  Patient presents with   Follow-up   Medication Refill   ADHD   HPI: Patient came today to the office for his appointment.  He is taking his medication as prescribed.  He is busy working 40 hours a week 3 days a week.  He reported job is very busy and sometimes the as to work more but he do not like to work more due to chronic pain.  He is seeing pain management and taking muscle relaxant, anti-inflammatory and narcotic pain medicine.  He is taking Adderall  20 mg twice a day which helps his attention, focus, multitasking.  He does not get distracted at work.  He sleeps okay.  Patient told some anxiety about upcoming family wedding at [place].  Initially his fiance wants to go but now she decided not to go due to family issues and patient has to go with his mother which he does not want.  Patient denies any agitation, hallucination, mania, psychosis.  He reported his neck pain is not as bad.  He is still fidgety and restless sometimes.  He denies any suicidal thoughts or homicidal thoughts.  He denies drinking or using any illegal substances.  His appetite is okay.  His weight is stable.  Energy level is okay.  He has chronic pain in his back and in his right hand.  He wants to continue current medicine which is working and helping him.  Visit Diagnosis:    ICD-10-CM   1. Attention deficit hyperactivity disorder (ADHD), combined type  F90.2        Past Psychiatric History: Reviewed. H/O ADHD diagnosed in 2005.  Took Adderall  until 2013 but stopped after taking pain medication. 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  H/O suicidal thoughts when going through withdrawals but no attempts. No H/O mania.  Had tried Adderall  for a while and then Vyvanse  but had GI side effects.  Strattera   did not help.  PCP tried trazodone  for sleep S/P brain surgery. H/O alcohol on and off but no current use. No history of cocaine  or any IV drugs.   Past Medical History:  Past Medical History:  Diagnosis Date   Allergy    Arthritis    Blood transfusion without reported diagnosis    around age 34 after major nausea secondary to prescribed MS Contin    GERD (gastroesophageal reflux disease)    Hyperlipidemia    Hypertension    Migraines    Wears glasses     Past Surgical History:  Procedure Laterality Date   APPENDECTOMY  1980s   post op bleeding sent him back for ex lap and hemostatic intervention.    BACK SURGERY     8 surgeries total - fused from L1-S1   COLONOSCOPY     Age 71. For bleeding   ESOPHAGOGASTRODUODENOSCOPY N/A 06/10/2015   Procedure: ESOPHAGOGASTRODUODENOSCOPY (EGD);  Surgeon: Princella CHRISTELLA Nida, MD;  Location: [facility] ENDOSCOPY;  Service: Endoscopy;  Laterality: N/A;   ex lap with peri gastric vessel ligation  1990s   after retching, he rupture a blood vessel outside the stomach.  vessel was ligated per pt's descriiption.    INCISIONAL HERNIA REPAIR Right 10/21/2016   Procedure: OPEN FLANK HERNIA REPAIR WITH MESH;  Surgeon: Lynda Leos, MD;  Location: [facility] OR;  Service: General;  Laterality:  Right;   INSERTION OF MESH Right 10/21/2016   Procedure: INSERTION OF MESH;  Surgeon: Lynda Leos, MD;  Location: [facility] OR;  Service: General;  Laterality: Right;   KNEE SURGERY     Multiple bilateral scopes   RETINAL DETACHMENT SURGERY Right    SHOULDER SURGERY     4x on the left - total shoulder replacement.    SHOULDER SURGERY Left    x2   TOTAL SHOULDER ARTHROPLASTY Left    x2    Family Psychiatric History: Reviewed.  Family History:  Family History  Problem Relation Age of Onset   Arthritis Mother    Hyperlipidemia Mother    Hypertension Father    Cancer Father    Skin cancer Father    Colon cancer Maternal Grandmother    Cancer Maternal Grandmother    Hypertension  Sister    Hypertension Paternal Grandmother    Heart attack Paternal Grandfather    Heart disease Paternal Grandfather    Diabetes Neg Hx    Stroke Neg Hx    Prostate cancer Neg Hx     Social History:  Social History   Socioeconomic History   Marital status: Single    Spouse name: Not on file   Number of children: 0   Years of education: 16   Highest education level: Bachelor's degree (e.g., BA, AB, BS)  Occupational History   Occupation: Disability    Comment: Former Charity fundraiser  Tobacco Use   Smoking status: Former    Types: Cigarettes   Smokeless tobacco: Never  Vaping Use   Vaping status: Never Used  Substance and Sexual Activity   Alcohol use: Not Currently    Alcohol/week: 0.0 standard drinks of alcohol   Drug use: No   Sexual activity: Not on file  Other Topics Concern   Not on file  Social History Narrative   Fun: play guitar / limited funds      Denies any religious beliefs effecting health care.    Social Drivers of Corporate investment banker Strain: Low Risk  (04/24/2023)   Overall Financial Resource Strain (CARDIA)    Difficulty of Paying Living Expenses: Not very hard  Food Insecurity: No Food Insecurity (04/24/2023)   Hunger Vital Sign    Worried About Running Out of Food in the Last Year: Never true    Ran Out of Food in the Last Year: Never true  Transportation Needs: No Transportation Needs (04/24/2023)   PRAPARE - Administrator, Civil Service (Medical): No    Lack of Transportation (Non-Medical): No  Physical Activity: Insufficiently Active (04/24/2023)   Exercise Vital Sign    Days of Exercise per Week: 3 days    Minutes of Exercise per Session: 30 min  Stress: Stress Concern Present (04/24/2023)   [employer] of Occupational Health - Occupational Stress Questionnaire    Feeling of Stress : To some extent  Social Connections: Unknown (04/24/2023)   Social Connection and Isolation Panel    Frequency of Communication with Friends and Family:  Once a week    Frequency of Social Gatherings with Friends and Family: Once a week    Attends Religious Services: Patient declined    Database administrator or Organizations: No    Attends Banker Meetings: Never    Marital Status: Living with partner    Allergies:  Allergies  Allergen Reactions   Fentanyl  Other (See Comments)    Hallucinations (with the patch)  Other Reaction(s): Other (  See Comments)  Hears voice   Fentanyl  Patch   Lisinopril Swelling   Lyrica [Pregabalin]     hallucinations   Metoprolol  Other (See Comments)   Sulfa Antibiotics Swelling    Metabolic Disorder Labs: Lab Results  Component Value Date   HGBA1C 5.6 07/21/2023   No results found for: PROLACTIN Lab Results  Component Value Date   CHOL 160 07/21/2023   TRIG 121.0 07/21/2023   HDL 56.90 07/21/2023   CHOLHDL 3 07/21/2023   VLDL 24.2 07/21/2023   LDLCALC 79 07/21/2023   LDLCALC 80 04/11/2022   Lab Results  Component Value Date   TSH 1.31 07/21/2023   TSH 0.93 11/04/2021    Therapeutic Level Labs: No results found for: LITHIUM No results found for: VALPROATE No results found for: CBMZ  Current Medications: Current Outpatient Medications  Medication Sig Dispense Refill   butalbital -acetaminophen -caffeine  (BAC, BUTALBITAL -ACETAMIN-CAFF,) 50-325-40 MG tablet Take 1 tablet by mouth every 6 (six) hours as needed. 120 tablet 1   Cholecalciferol (VITAMIN D3) 25 MCG (1000 UT) CAPS Take by mouth daily.     Coenzyme Q10 (COQ10) 100 MG CAPS Take 100 mg by mouth daily.     cyanocobalamin 100 MCG tablet Take 100 mcg by mouth daily.     fluticasone  (FLONASE ) 50 MCG/ACT nasal spray Place 2 sprays into both nostrils daily. 16 g 11   Lidocaine  4 % PTCH Place 1-2 patches onto the skin 2 (two) times daily as needed (for pain.). Salonpas Maximum Strength Pain Relieving Gel-Patch     meloxicam  (MOBIC ) 15 MG tablet Take 1 tablet (15 mg total) by mouth daily as needed. 30 tablet 2    morphine  (MSIR) 30 MG tablet Take 30 mg by mouth every 4 (four) hours as needed.     Multiple Vitamin (MULTIVITAMIN WITH MINERALS) TABS tablet Take 1 tablet by mouth daily.     naloxone  (NARCAN ) nasal spray 4 mg/0.1 mL Place into the nose.     Omega-3 Fatty Acids (OMEGA 3 PO) Take 120 mg by mouth daily.     ondansetron  (ZOFRAN ) 4 MG tablet TAKE 1 TABLET BY MOUTH EVERY 8 HOURS AS NEEDED FOR NAUSEA AND VOMITING 30 tablet 3   rosuvastatin  (CRESTOR ) 20 MG tablet Take 1 tablet (20 mg total) by mouth daily. 90 tablet 1   SUMAtriptan  (IMITREX ) 100 MG tablet MAY REPEAT IN 2 HOURS IF HEADACHE PERSISTS OR RECURS. 9 tablet 3   tazarotene (TAZORAC) 0.1 % gel APPLY A SMALL AMOUNT TO AFFECTED AREA AT BEDTIME     testosterone  cypionate (DEPOTESTOSTERONE CYPIONATE) 200 MG/ML injection Inject 0.5 mLs (100 mg total) into the muscle once a week. 10 mL 1   amLODipine  (NORVASC ) 10 MG tablet Take 1 tablet (10 mg total) by mouth daily. 90 tablet 1   amphetamine -dextroamphetamine  (ADDERALL  XR) 20 MG 24 hr capsule Take 1 capsule (20 mg total) by mouth in the morning and at bedtime. 60 capsule 0   butalbital -acetaminophen -caffeine  (BAC) 50-325-40 MG tablet Take 1 tablet by mouth every 6 (six) hours as needed. 120 tablet 1   butalbital -acetaminophen -caffeine  (BAC, BUTALBITAL -ACETAMIN-CAFF,) 50-325-40 MG tablet Take 1 tablet by mouth every six hours as needed 120 tablet 1   No current facility-administered medications for this visit.    Psychiatric Specialty Exam: Physical Exam  Review of Systems  Musculoskeletal:  Positive for back pain.       Joint pain    Blood pressure (!) 146/90, pulse 91, height 5' 9.5 (1.765 m), weight 179 lb (81.2 kg).Body  mass index is 26.05 kg/m.  General Appearance: Casual  Eye Contact:  Good  Speech:  Slow  Volume:  Normal  Mood:  Anxious  Affect:  Congruent  Thought Process:  Goal Directed  Orientation:  Full (Time, Place, and Person)  Thought Content:  Logical  Suicidal  Thoughts:  No  Homicidal Thoughts:  No  Memory:  Immediate;   Good Recent;   Good Remote;   Good  Judgement:  Intact  Insight:  Present  Psychomotor Activity:  Restlessness and fidgety  Concentration:  Concentration: distracted and Attention Span: Fair  Recall:  Good  Fund of Knowledge:  Good  Language:  Good  Akathisia:  No  Handed:  Right  AIMS (if indicated):     Assets:  Communication Skills Desire for Improvement Housing Resilience Social Support Talents/Skills Transportation  ADL's:  Intact  Cognition:  WNL  Sleep:   ok       Screenings: GAD-7    Loss adjuster, chartered Office Visit from 08/24/2023 in [facility] from 07/21/2023 in [facility] [employer] from 12/22/2022 in [facility]  Total GAD-7 Score 2 1 2    PHQ2-9    Flowsheet Row Office Visit from 08/24/2023 in [facility] from 07/21/2023 in [facility] [place] Clinical Support from 04[facility]25/2023 in [facility] from 12/22/2022 in [facility] [employer] from 12/15/2022 in [facility]  PHQ-2 Total Score 1 2 0 1 1  PHQ-9 Total Score 6 5 -- 3 5   Flowsheet Row Office Visit from 12/15/2022 in [facility]  C-SSRS RISK CATEGORY Error: Question 6 not populated     Assessment and Plan: ADHD, combined type  Patient is stable on his current medication which is Adderall  XR 20 mg twice a day.  He usually takes the medicine when he goes to work in the evening.  Discussed medication side effects and benefits.  Reminded stimulant abuse, tolerance, withdrawal, dependency policies and interaction with pain medicine.  Recommend to call back if he has any question or any  concern.  Follow-up in 3 months.     Collaboration of Care: Collaboration of Care: Other provider involved in patient's care AEB notes are available in epic to review.  Patient/Guardian was advised Release of Information must be obtained prior to any record release in order to collaborate their care with an outside provider. Patient/Guardian was advised if they have not already done so to contact the registration department to sign all necessary forms in order for us  to release information regarding their care.   Consent: Patient/Guardian gives verbal consent for treatment and assignment of benefits for services provided during this visit. Patient/Guardian expressed understanding and agreed to proceed.    Leni ONEIDA Client, MD 06/13/2024, 10:35 AM

## 2024-06-17 ENCOUNTER — Other Ambulatory Visit (HOSPITAL_BASED_OUTPATIENT_CLINIC_OR_DEPARTMENT_OTHER): Payer: Self-pay

## 2024-06-17 ENCOUNTER — Other Ambulatory Visit: Payer: Self-pay

## 2024-06-24 ENCOUNTER — Other Ambulatory Visit: Payer: Self-pay

## 2024-07-04 DIAGNOSIS — L570 Actinic keratosis: Secondary | ICD-10-CM | POA: Diagnosis not present

## 2024-07-08 DIAGNOSIS — E230 Hypopituitarism: Secondary | ICD-10-CM | POA: Diagnosis not present

## 2024-07-09 DIAGNOSIS — G894 Chronic pain syndrome: Secondary | ICD-10-CM | POA: Diagnosis not present

## 2024-07-09 DIAGNOSIS — H35033 Hypertensive retinopathy, bilateral: Secondary | ICD-10-CM | POA: Diagnosis not present

## 2024-07-09 DIAGNOSIS — M62830 Muscle spasm of back: Secondary | ICD-10-CM | POA: Diagnosis not present

## 2024-07-09 DIAGNOSIS — H43813 Vitreous degeneration, bilateral: Secondary | ICD-10-CM | POA: Diagnosis not present

## 2024-07-09 DIAGNOSIS — H31093 Other chorioretinal scars, bilateral: Secondary | ICD-10-CM | POA: Diagnosis not present

## 2024-07-09 DIAGNOSIS — H2513 Age-related nuclear cataract, bilateral: Secondary | ICD-10-CM | POA: Diagnosis not present

## 2024-07-09 DIAGNOSIS — H35372 Puckering of macula, left eye: Secondary | ICD-10-CM | POA: Diagnosis not present

## 2024-07-09 DIAGNOSIS — M47818 Spondylosis without myelopathy or radiculopathy, sacral and sacrococcygeal region: Secondary | ICD-10-CM | POA: Diagnosis not present

## 2024-07-09 DIAGNOSIS — Z79891 Long term (current) use of opiate analgesic: Secondary | ICD-10-CM | POA: Diagnosis not present

## 2024-07-11 ENCOUNTER — Ambulatory Visit: Admitting: Physician Assistant

## 2024-07-15 ENCOUNTER — Other Ambulatory Visit (HOSPITAL_BASED_OUTPATIENT_CLINIC_OR_DEPARTMENT_OTHER): Payer: Self-pay

## 2024-07-16 DIAGNOSIS — M25552 Pain in left hip: Secondary | ICD-10-CM | POA: Diagnosis not present

## 2024-07-16 DIAGNOSIS — M25551 Pain in right hip: Secondary | ICD-10-CM | POA: Diagnosis not present

## 2024-07-16 DIAGNOSIS — M545 Low back pain, unspecified: Secondary | ICD-10-CM | POA: Diagnosis not present

## 2024-07-17 ENCOUNTER — Ambulatory Visit (INDEPENDENT_AMBULATORY_CARE_PROVIDER_SITE_OTHER): Admitting: Physician Assistant

## 2024-07-17 ENCOUNTER — Encounter: Payer: Self-pay | Admitting: Physician Assistant

## 2024-07-17 ENCOUNTER — Telehealth (HOSPITAL_COMMUNITY): Payer: Self-pay

## 2024-07-17 ENCOUNTER — Ambulatory Visit (HOSPITAL_COMMUNITY)
Admission: RE | Admit: 2024-07-17 | Discharge: 2024-07-17 | Disposition: A | Discharge status: Home / Self Care | Source: Ambulatory Visit | Attending: Physician Assistant | Admitting: Physician Assistant

## 2024-07-17 VITALS — BP 158/87 | HR 101 | Temp 98.6°F | Ht 69.5 in | Wt 178.2 lb

## 2024-07-17 DIAGNOSIS — K7689 Other specified diseases of liver: Secondary | ICD-10-CM | POA: Diagnosis not present

## 2024-07-17 DIAGNOSIS — R1013 Epigastric pain: Secondary | ICD-10-CM

## 2024-07-17 DIAGNOSIS — F902 Attention-deficit hyperactivity disorder, combined type: Secondary | ICD-10-CM

## 2024-07-17 DIAGNOSIS — K838 Other specified diseases of biliary tract: Secondary | ICD-10-CM | POA: Diagnosis not present

## 2024-07-17 LAB — CBC WITH DIFFERENTIAL/PLATELET
Basophils Absolute: 0 K/uL (ref 0.0–0.1)
Basophils Relative: 0.4 % (ref 0.0–3.0)
Eosinophils Absolute: 0.2 K/uL (ref 0.0–0.7)
Eosinophils Relative: 2.3 % (ref 0.0–5.0)
HCT: 40.5 % (ref 39.0–52.0)
Hemoglobin: 14.1 g/dL (ref 13.0–17.0)
Lymphocytes Relative: 28.5 % (ref 12.0–46.0)
Lymphs Abs: 2.3 K/uL (ref 0.7–4.0)
MCHC: 34.7 g/dL (ref 30.0–36.0)
MCV: 86.4 fl (ref 78.0–100.0)
Monocytes Absolute: 0.6 K/uL (ref 0.1–1.0)
Monocytes Relative: 8 % (ref 3.0–12.0)
Neutro Abs: 4.9 K/uL (ref 1.4–7.7)
Neutrophils Relative %: 60.8 % (ref 43.0–77.0)
Platelets: 273 K/uL (ref 150.0–400.0)
RBC: 4.69 Mil/uL (ref 4.22–5.81)
RDW: 12.6 % (ref 11.5–15.5)
WBC: 8 K/uL (ref 4.0–10.5)

## 2024-07-17 LAB — COMPREHENSIVE METABOLIC PANEL WITH GFR
ALT: 35 U/L (ref 0–53)
AST: 45 U/L — ABNORMAL HIGH (ref 0–37)
Albumin: 4.7 g/dL (ref 3.5–5.2)
Alkaline Phosphatase: 51 U/L (ref 39–117)
BUN: 17 mg/dL (ref 6–23)
CO2: 26 meq/L (ref 19–32)
Calcium: 9.3 mg/dL (ref 8.4–10.5)
Chloride: 98 meq/L (ref 96–112)
Creatinine, Ser: 0.89 mg/dL (ref 0.40–1.50)
GFR: 94.66 mL/min (ref >60.00)
Glucose, Bld: 97 mg/dL (ref 70–99)
Potassium: 4 meq/L (ref 3.5–5.1)
Sodium: 136 meq/L (ref 135–145)
Total Bilirubin: 0.5 mg/dL (ref 0.2–1.2)
Total Protein: 7.2 g/dL (ref 6.0–8.3)

## 2024-07-17 LAB — LIPASE: Lipase: 39 U/L (ref 11.0–59.0)

## 2024-07-17 NOTE — Telephone Encounter (Signed)
 Patient is calling for his refill on Adderall , patient is not due yet, but will be in 3 days. He last picked up on 7/31. He has a follow up on 10/30 and his pharmacy is correct. Please review and advise, thank you

## 2024-07-17 NOTE — Progress Notes (Signed)
 Patient ID: Jeremiah Anderson, male    DOB: 11/26/1965, 58 y.o.   MRN: [id number]   Assessment & Plan:  Epigastric pain -     US  Abdomen Complete; Future -     CBC with Differential/Platelet -     Comprehensive metabolic panel with GFR -     Lipase -     H. pylori breath test    Assessment & Plan Epigastric abdominal pain with vomiting and fever Epigastric abdominal pain persisting for five days, associated with vomiting and fever. Pain is severe, described as one of the worst experienced, with episodes of sweating and chills. Vomiting initially yellow, then green, resembling bile. Pain exacerbated by certain foods, with some relief when lying on the side. Differential diagnosis includes gallbladder issues, pancreatitis, and H. pylori infection. Consideration of a viral etiology due to the presence of fever and chills. No diarrhea reported. Pain currently rated 4-5/10. - Order abdominal ultrasound at [facility], depending on availability. - Perform CBC to check for elevated white blood cell count. - Check liver function tests. - Measure lipase level to rule out pancreatitis. - Test for H. pylori infection. - Advise against eating or drinking until after the ultrasound. - Recommend antacids for symptomatic relief. - Instruct to go to the ER if symptoms worsen suddenly.      No follow-ups on file.    Subjective:    Chief Complaint  Patient presents with   Abdominal Pain    Think could have gallbladder problem; after eating chips and salsa on Friday afternoon, sharp pains started in stomach, started vomiting, and barely able to eat since then. In pain all weekend and last night at work.    Abdominal Pain   Discussed the use of AI scribe software for clinical note transcription with the patient, who gave verbal consent to proceed.  History of Present Illness Jeremiah Anderson is a 58 year old male who presents with severe abdominal pain and vomiting.  He has been  experiencing severe abdominal pain since Friday afternoon after consuming chips. The pain is located in the epigastric region and radiates to his back. It is described as one of the worst pains he has ever experienced, severe enough to consider calling an ambulance. The pain is exacerbated by movement and alleviated when lying on his side.  He has been vomiting, initially yellow and then green, which he associates with bile. He experienced fever and chills on the night the symptoms began, with a maximum temperature of 101F, along with sweating and shaking. No diarrhea is present, but he has light brown, glaze-like stools.  He has been managing his symptoms with Zofran  and a bland diet, mainly rice. Foods like peanut butter crackers and granola bars worsen his symptoms. He uses antacids like Maalox for relief.  He is concerned about his ability to work due to his symptoms and feels tired and sore from work-related activities. He has not eaten since early this morning and is drinking Diet Coke for caffeine . No recent changes in bowel habits aside from the light brown stool. He feels distended and sore in the abdominal area.     Past Medical History:  Diagnosis Date   Allergy    Arthritis    Blood transfusion without reported diagnosis    around age 82 after major nausea secondary to prescribed MS Contin    GERD (gastroesophageal reflux disease)    Hyperlipidemia    Hypertension    Migraines  Syncope and collapse 11/22   I got some head rushes   Ulcer 11/21/2008   Not actually an ulcer but stomach irritation to the point could not hold down food or take medication until treated. I take medication for Thalia and have not had the problem since   Wears glasses     Past Surgical History:  Procedure Laterality Date   APPENDECTOMY  1980s   post op bleeding sent him back for ex lap and hemostatic intervention.    BACK SURGERY     8 surgeries total - fused from L1-S1   BRAIN SURGERY      COLONOSCOPY     Age 43. For bleeding   ESOPHAGOGASTRODUODENOSCOPY N/A 06/10/2015   Procedure: ESOPHAGOGASTRODUODENOSCOPY (EGD);  Surgeon: Princella CHRISTELLA Nida, MD;  Location: [facility] ENDOSCOPY;  Service: Endoscopy;  Laterality: N/A;   ex lap with peri gastric vessel ligation  1990s   after retching, he rupture a blood vessel outside the stomach.  vessel was ligated per pt's descriiption.    EYE SURGERY  2018   Laser surgery to fix retina tears   HERNIA REPAIR  2019   Flank hernia right side from where surgeon cut open my side to do the back fusion surgery and it never healed correctly   INCISIONAL HERNIA REPAIR Right 10/21/2016   Procedure: OPEN [facility] HERNIA REPAIR WITH MESH;  Surgeon: Lynda Leos, MD;  Location: [facility] OR;  Service: General;  Laterality: Right;   INSERTION OF MESH Right 10/21/2016   Procedure: INSERTION OF MESH;  Surgeon: Lynda Leos, MD;  Location: [facility] OR;  Service: General;  Laterality: Right;   JOINT REPLACEMENT     KNEE SURGERY     Multiple bilateral scopes   RETINAL DETACHMENT SURGERY Right    SHOULDER SURGERY     4x on the left - total shoulder replacement.    SHOULDER SURGERY Left    x2   SPINE SURGERY  First 1990   9 surgeries total, I am fused from S1-L1 and now have flat back syndrome   TOTAL SHOULDER ARTHROPLASTY Left    x2    Family History  Problem Relation Age of Onset   Arthritis Mother    Hyperlipidemia Mother    Hypertension Father    Cancer Father    Skin cancer Father    Colon cancer Maternal Grandmother    Cancer Maternal Grandmother    Hypertension Sister    Hypertension Paternal Grandmother    Heart attack Paternal Grandfather    Heart disease Paternal Grandfather    Hypertension Paternal Grandfather    Diabetes Neg Hx    Stroke Neg Hx    Prostate cancer Neg Hx     Social History   Tobacco Use   Smoking status: Former    Types: Cigarettes   Smokeless tobacco: Never  Vaping Use   Vaping status: Never Used  Substance Use Topics    Alcohol use: Not Currently    Alcohol/week: 0.0 standard drinks of alcohol   Drug use: No     Allergies  Allergen Reactions   Fentanyl  Other (See Comments)    Hallucinations (with the patch)  Other Reaction(s): Other (See Comments)  Hears voice   Fentanyl  Patch   Lisinopril Swelling   Lyrica [Pregabalin]     hallucinations   Metoprolol  Other (See Comments)   Sulfa Antibiotics Swelling    Review of Systems  Gastrointestinal:  Positive for abdominal pain.   NEGATIVE UNLESS OTHERWISE INDICATED IN HPI  Objective:     BP (!) 158/87   Pulse (!) 101   Temp 98.6 F (37 C)   Ht 5' 9.5 (1.765 m)   Wt 178 lb 3.2 oz (80.8 kg)   SpO2 97%   BMI 25.94 kg/m   Wt Readings from Last 3 Encounters:  07/17/24 178 lb 3.2 oz (80.8 kg)  02/21/24 184 lb (83.5 kg)  01/24/24 181 lb (82.1 kg)    BP Readings from Last 3 Encounters:  07/17/24 (!) 158/87  02/21/24 (!) 130/90  01/18/24 (!) 150/94     Physical Exam Vitals and nursing note reviewed.  Constitutional:      Appearance: He is well-developed.  Eyes:     Extraocular Movements: Extraocular movements intact.     Conjunctiva/sclera: Conjunctivae normal.     Pupils: Pupils are equal, round, and reactive to light.  Cardiovascular:     Rate and Rhythm: Tachycardia present.     Heart sounds: Normal heart sounds.  Pulmonary:     Effort: Pulmonary effort is normal.     Breath sounds: Normal breath sounds.  Abdominal:     General: There is no distension.     Palpations: There is no mass.     Tenderness: There is abdominal tenderness (epigastric). There is no right CVA tenderness, left CVA tenderness, guarding or rebound.     Hernia: No hernia is present.  Musculoskeletal:     Right lower leg: No edema.     Left lower leg: No edema.  Neurological:     Mental Status: He is alert.  Psychiatric:        Mood and Affect: Mood normal.             Walid Haig M Makylie Rivere, PA-C

## 2024-07-17 NOTE — Patient Instructions (Signed)
  VISIT SUMMARY: You came in today with severe abdominal pain and vomiting that started after eating chips. The pain is in your upper abdomen and radiates to your back, and you also experienced fever and chills. We discussed several possible causes, including gallbladder issues, pancreatitis, and an infection.  YOUR PLAN: EPIGASTRIC ABDOMINAL PAIN WITH VOMITING AND FEVER: You have been experiencing severe abdominal pain, vomiting, and fever for the past five days. The pain is very intense and is made worse by certain foods. -We will order an abdominal ultrasound to check for any issues with your gallbladder or other organs. -We will perform a complete blood count (CBC) to check for signs of infection. -We will check your liver function tests. -We will measure your lipase level to rule out pancreatitis. -We will test for H. pylori infection. -Do not eat or drink anything until after the ultrasound. -You can take antacids for relief. -Go to the emergency room if your symptoms suddenly get worse.                      Contains text generated by Abridge.                                 Contains text generated by Abridge.

## 2024-07-18 ENCOUNTER — Ambulatory Visit: Payer: Self-pay | Admitting: Physician Assistant

## 2024-07-18 MED ORDER — AMPHETAMINE-DEXTROAMPHET ER 20 MG PO CP24
20.0000 mg | ORAL_CAPSULE | Freq: Two times a day (BID) | ORAL | 0 refills | Status: DC
Start: 2024-07-20 — End: 2024-08-15

## 2024-07-18 NOTE — Telephone Encounter (Signed)
Prescription sent.  He can pick up on his due date. 

## 2024-07-19 ENCOUNTER — Other Ambulatory Visit

## 2024-07-19 DIAGNOSIS — R1013 Epigastric pain: Secondary | ICD-10-CM

## 2024-07-19 NOTE — Addendum Note (Signed)
 Addended by: NEYSA CLARIA SAILOR on: 07/19/2024 11:23 AM   Modules accepted: Orders

## 2024-07-23 LAB — H. PYLORI BREATH TEST: H. pylori Breath Test: NOT DETECTED

## 2024-07-26 ENCOUNTER — Encounter: Payer: Self-pay | Admitting: Physician Assistant

## 2024-07-29 NOTE — Telephone Encounter (Signed)
Please see pt msg/response 

## 2024-07-29 NOTE — Telephone Encounter (Signed)
 See pt msg and advise patient.

## 2024-07-30 DIAGNOSIS — M25551 Pain in right hip: Secondary | ICD-10-CM | POA: Diagnosis not present

## 2024-07-30 DIAGNOSIS — M545 Low back pain, unspecified: Secondary | ICD-10-CM | POA: Diagnosis not present

## 2024-07-30 DIAGNOSIS — M25552 Pain in left hip: Secondary | ICD-10-CM | POA: Diagnosis not present

## 2024-08-06 ENCOUNTER — Ambulatory Visit

## 2024-08-06 DIAGNOSIS — Z79891 Long term (current) use of opiate analgesic: Secondary | ICD-10-CM | POA: Diagnosis not present

## 2024-08-06 DIAGNOSIS — G894 Chronic pain syndrome: Secondary | ICD-10-CM | POA: Diagnosis not present

## 2024-08-07 ENCOUNTER — Other Ambulatory Visit (HOSPITAL_BASED_OUTPATIENT_CLINIC_OR_DEPARTMENT_OTHER): Payer: Self-pay

## 2024-08-07 DIAGNOSIS — M47818 Spondylosis without myelopathy or radiculopathy, sacral and sacrococcygeal region: Secondary | ICD-10-CM | POA: Diagnosis not present

## 2024-08-07 DIAGNOSIS — M25551 Pain in right hip: Secondary | ICD-10-CM | POA: Diagnosis not present

## 2024-08-07 DIAGNOSIS — G894 Chronic pain syndrome: Secondary | ICD-10-CM | POA: Diagnosis not present

## 2024-08-07 DIAGNOSIS — Z79891 Long term (current) use of opiate analgesic: Secondary | ICD-10-CM | POA: Diagnosis not present

## 2024-08-07 DIAGNOSIS — M545 Low back pain, unspecified: Secondary | ICD-10-CM | POA: Diagnosis not present

## 2024-08-07 DIAGNOSIS — M62830 Muscle spasm of back: Secondary | ICD-10-CM | POA: Diagnosis not present

## 2024-08-07 DIAGNOSIS — M25552 Pain in left hip: Secondary | ICD-10-CM | POA: Diagnosis not present

## 2024-08-07 MED ORDER — BUTALBITAL-APAP-CAFFEINE 50-325-40 MG PO TABS
1.0000 | ORAL_TABLET | Freq: Four times a day (QID) | ORAL | 1 refills | Status: DC | PRN
  Filled 2024-08-07 – 2024-08-12 (×2): qty 120, 30d supply, fill #0
  Filled 2024-09-05: qty 120, 30d supply, fill #1

## 2024-08-08 ENCOUNTER — Other Ambulatory Visit: Payer: Self-pay

## 2024-08-08 DIAGNOSIS — M79641 Pain in right hand: Secondary | ICD-10-CM | POA: Insufficient documentation

## 2024-08-08 DIAGNOSIS — M180 Bilateral primary osteoarthritis of first carpometacarpal joints: Secondary | ICD-10-CM | POA: Diagnosis not present

## 2024-08-08 DIAGNOSIS — G894 Chronic pain syndrome: Secondary | ICD-10-CM | POA: Diagnosis not present

## 2024-08-09 ENCOUNTER — Other Ambulatory Visit: Payer: Self-pay | Admitting: Physician Assistant

## 2024-08-09 DIAGNOSIS — G894 Chronic pain syndrome: Secondary | ICD-10-CM | POA: Insufficient documentation

## 2024-08-12 ENCOUNTER — Other Ambulatory Visit (HOSPITAL_BASED_OUTPATIENT_CLINIC_OR_DEPARTMENT_OTHER): Payer: Self-pay

## 2024-08-15 ENCOUNTER — Ambulatory Visit: Admitting: Physician Assistant

## 2024-08-15 ENCOUNTER — Encounter: Payer: Self-pay | Admitting: Physician Assistant

## 2024-08-15 VITALS — BP 136/88 | HR 98 | Temp 97.3°F | Ht 69.5 in | Wt 180.4 lb

## 2024-08-15 DIAGNOSIS — Z125 Encounter for screening for malignant neoplasm of prostate: Secondary | ICD-10-CM | POA: Diagnosis not present

## 2024-08-15 DIAGNOSIS — Z23 Encounter for immunization: Secondary | ICD-10-CM

## 2024-08-15 DIAGNOSIS — F902 Attention-deficit hyperactivity disorder, combined type: Secondary | ICD-10-CM

## 2024-08-15 DIAGNOSIS — M25552 Pain in left hip: Secondary | ICD-10-CM | POA: Diagnosis not present

## 2024-08-15 DIAGNOSIS — E782 Mixed hyperlipidemia: Secondary | ICD-10-CM | POA: Diagnosis not present

## 2024-08-15 DIAGNOSIS — M25551 Pain in right hip: Secondary | ICD-10-CM | POA: Diagnosis not present

## 2024-08-15 DIAGNOSIS — Z0000 Encounter for general adult medical examination without abnormal findings: Secondary | ICD-10-CM | POA: Diagnosis not present

## 2024-08-15 DIAGNOSIS — M545 Low back pain, unspecified: Secondary | ICD-10-CM | POA: Diagnosis not present

## 2024-08-15 DIAGNOSIS — G8929 Other chronic pain: Secondary | ICD-10-CM | POA: Diagnosis not present

## 2024-08-15 LAB — COMPREHENSIVE METABOLIC PANEL WITH GFR
ALT: 30 U/L (ref 0–53)
AST: 29 U/L (ref 0–37)
Albumin: 4.8 g/dL (ref 3.5–5.2)
Alkaline Phosphatase: 59 U/L (ref 39–117)
BUN: 19 mg/dL (ref 6–23)
CO2: 32 meq/L (ref 19–32)
Calcium: 10 mg/dL (ref 8.4–10.5)
Chloride: 98 meq/L (ref 96–112)
Creatinine, Ser: 1.13 mg/dL (ref 0.40–1.50)
GFR: 71.75 mL/min (ref >60.00)
Glucose, Bld: 104 mg/dL — ABNORMAL HIGH (ref 70–99)
Potassium: 4.9 meq/L (ref 3.5–5.1)
Sodium: 141 meq/L (ref 135–145)
Total Bilirubin: 0.4 mg/dL (ref 0.2–1.2)
Total Protein: 6.9 g/dL (ref 6.0–8.3)

## 2024-08-15 LAB — CBC WITH DIFFERENTIAL/PLATELET
Basophils Absolute: 0 K/uL (ref 0.0–0.1)
Basophils Relative: 0.4 % (ref 0.0–3.0)
Eosinophils Absolute: 0.1 K/uL (ref 0.0–0.7)
Eosinophils Relative: 1.5 % (ref 0.0–5.0)
HCT: 40.8 % (ref 39.0–52.0)
Hemoglobin: 14.3 g/dL (ref 13.0–17.0)
Lymphocytes Relative: 27.8 % (ref 12.0–46.0)
Lymphs Abs: 2.3 K/uL (ref 0.7–4.0)
MCHC: 35 g/dL (ref 30.0–36.0)
MCV: 87.2 fl (ref 78.0–100.0)
Monocytes Absolute: 0.6 K/uL (ref 0.1–1.0)
Monocytes Relative: 7 % (ref 3.0–12.0)
Neutro Abs: 5.2 K/uL (ref 1.4–7.7)
Neutrophils Relative %: 63.3 % (ref 43.0–77.0)
Platelets: 277 K/uL (ref 150.0–400.0)
RBC: 4.68 Mil/uL (ref 4.22–5.81)
RDW: 12.6 % (ref 11.5–15.5)
WBC: 8.2 K/uL (ref 4.0–10.5)

## 2024-08-15 LAB — LIPID PANEL
Cholesterol: 198 mg/dL (ref 0–200)
HDL: 68.9 mg/dL (ref >39.00)
LDL Cholesterol: 63 mg/dL (ref 0–99)
NonHDL: 129.52
Total CHOL/HDL Ratio: 3
Triglycerides: 331 mg/dL — ABNORMAL HIGH (ref 0.0–149.0)
VLDL: 66.2 mg/dL — ABNORMAL HIGH (ref 0.0–40.0)

## 2024-08-15 LAB — PSA: PSA: 0.49 ng/mL (ref 0.10–4.00)

## 2024-08-15 LAB — HEMOGLOBIN A1C: Hgb A1c MFr Bld: 5.8 % (ref 4.6–6.5)

## 2024-08-15 LAB — TSH: TSH: 1.9 u[IU]/mL (ref 0.35–5.50)

## 2024-08-15 NOTE — Progress Notes (Signed)
 Patient ID: Jeremiah Anderson, male    DOB: 06/02/1966, 58 y.o.   MRN: [id number]   Assessment & Plan:  Wellness examination -     CBC with Differential/Platelet -     Comprehensive metabolic panel with GFR -     Hemoglobin A1c -     Lipid panel -     PSA -     TSH  Immunization due -     Flu vaccine trivalent PF, 6mos and older(Flulaval,Afluria,Fluarix,Fluzone)  Attention deficit hyperactivity disorder (ADHD), combined type -     Amphetamine -Dextroamphet ER; Take 1 capsule (20 mg total) by mouth in the morning and at bedtime.  Dispense: 60 capsule; Refill: 0 -     Amphetamine -Dextroamphet ER; Take 1 capsule (20 mg total) by mouth in the morning and at bedtime.  Dispense: 60 capsule; Refill: 0 -     Amphetamine -Dextroamphet ER; Take 1 capsule (20 mg total) by mouth in the morning and at bedtime.  Dispense: 60 capsule; Refill: 0  Chronic left hip pain  Mixed hyperlipidemia    Assessment & Plan Adult Wellness Visit Routine annual physical examination. Up to date with colon cancer screening and tetanus vaccination. Received flu shot during the visit. - Order CBC, CMP, A1c, lipid panel, PSA, and TSH. - Administer flu shot. - Ensure colon cancer screening is up to date. - Ensure tetanus vaccination is up to date.  Attention-deficit hyperactivity disorder (ADHD) Well-managed on Adderall  XR 20 mg, taken in the morning and afternoon. Medication is effective in managing symptoms. Will start prescribing from PCP office, pt not wanting to see psychiatry anymore. PDMP reviewed and medication taken as directed. - Prescribe Adderall  XR 20 mg. - Schedule follow-up in three months.  Chronic hip pain Chronic hip pain with episodes of tightness and relief following needling therapy. Pain recurs after initial improvement. - Continue needling therapy for hip pain.  Hyperlipidemia Hyperlipidemia managed with statin therapy, resulting in significant improvement in cholesterol levels. -  Continue statin therapy.  General Health Maintenance Discussed flu vaccination, COVID-19 vaccination, and RSV vaccination. Advised to get flu shot today. Discussed potential side effects of flu shot. COVID-19 vaccination recommended if comfortable. RSV vaccination advised after age 25. - Administer flu shot today. - Consider COVID-19 vaccination if comfortable. - Plan RSV vaccination after age 25.      Return in about 3 months (around 11/14/2024) for ADHD f/up .    Subjective:    Chief Complaint  Patient presents with   Annual Exam    Pt in office for annual CPE and labs; pt not fasting for labs today; pt states only change is needing to have thumb surgery;     HPI Discussed the use of AI scribe software for clinical note transcription with the patient, who gave verbal consent to proceed.  History of Present Illness Jeremiah Anderson is a 58 year old male who presents for an annual physical exam and ADHD management.  He has been taking Adderall  XR 20 mg in the morning and afternoon for his ADHD. 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  He experiences severe hip pain that makes walking difficult. He receives needling treatments that provide temporary relief, but the pain recurs, affecting his mobility. He has a history of chronic pain and has been managing it for years. He describes challenges in coordinating medication refills around family events, expressing frustration with the process, especially when it involves his work schedule and family obligations.  He has received a flu shot and is considering a pneumonia shot. He has previously received vaccinations at his workplace and is contemplating the logistics of getting the flu shot there again. He is also considering a COVID-19 vaccine due to his exposure to health risks at work.  He mentions a past brain tumor and wonders if it might have affected his physical activity levels. He also discusses his  experience with statin medication, which effectively managed his cholesterol levels after initial hesitation. The statin significantly lowered his cholesterol, allowing more dietary freedom.  His stomach has been stable with no recent issues. He recalls a past episode of back pain, which was unusual given his typical back pain patterns. He describes having 'big bones' in his back but does not report any current swelling or significant issues.     Past Medical History:  Diagnosis Date   Allergy    Arthritis    Blood transfusion without reported diagnosis    around age 14 after major nausea secondary to prescribed MS Contin    GERD (gastroesophageal reflux disease)    Hyperlipidemia    Hypertension    Migraines    Syncope and collapse 11/22   I got some head rushes   Ulcer 11/21/2008   Not actually an ulcer but stomach irritation to the point could not hold down food or take medication until treated. I take medication for Thalia and have not had the problem since   Wears glasses     Past Surgical History:  Procedure Laterality Date   APPENDECTOMY  1980s   post op bleeding sent him back for ex lap and hemostatic intervention.    BACK SURGERY     8 surgeries total - fused from L1-S1   BRAIN SURGERY     COLONOSCOPY     Age 4. For bleeding   ESOPHAGOGASTRODUODENOSCOPY N/A 06/10/2015   Procedure: ESOPHAGOGASTRODUODENOSCOPY (EGD);  Surgeon: Princella CHRISTELLA Nida, MD;  Location: [facility] ENDOSCOPY;  Service: Endoscopy;  Laterality: N/A;   ex lap with peri gastric vessel ligation  1990s   after retching, he rupture a blood vessel outside the stomach.  vessel was ligated per pt's descriiption.    EYE SURGERY  2018   Laser surgery to fix retina tears   HERNIA REPAIR  2019   Flank hernia right side from where surgeon cut open my side to do the back fusion surgery and it never healed correctly   INCISIONAL HERNIA REPAIR Right 10/21/2016   Procedure: OPEN [facility] HERNIA REPAIR WITH MESH;  Surgeon: Lynda Leos, MD;  Location: [facility] OR;  Service: General;  Laterality: Right;   INSERTION OF MESH Right 10/21/2016   Procedure: INSERTION OF MESH;  Surgeon: Lynda Leos, MD;  Location: [facility] OR;  Service: General;  Laterality: Right;   JOINT REPLACEMENT     KNEE SURGERY     Multiple bilateral scopes   RETINAL DETACHMENT SURGERY Right    SHOULDER SURGERY     4x on the left - total shoulder replacement.    SHOULDER SURGERY Left    x2   SPINE SURGERY  First 1990   9 surgeries total, I am fused from S1-L1 and now have flat back syndrome   TOTAL SHOULDER ARTHROPLASTY Left    x2    Family History  Problem Relation Age of Onset   Arthritis Mother    Hyperlipidemia Mother    Hypertension Father    Cancer Father    Skin cancer Father    Colon cancer  Maternal Grandmother    Cancer Maternal Grandmother    Hypertension Sister    Hypertension Paternal Grandmother    Heart attack Paternal Grandfather    Heart disease Paternal Grandfather    Hypertension Paternal Grandfather    Diabetes Neg Hx    Stroke Neg Hx    Prostate cancer Neg Hx     Social History   Tobacco Use   Smoking status: Former    Types: Cigarettes   Smokeless tobacco: Never  Vaping Use   Vaping status: Never Used  Substance Use Topics   Alcohol use: Not Currently   Drug use: No     Allergies  Allergen Reactions   Lisinopril Swelling and Anaphylaxis   Fentanyl  Other (See Comments)    Hallucinations (with the patch)  Other Reaction(s): Other (See Comments)  Hears voice   Fentanyl  Patch   Metoprolol  Other (See Comments)   Sulfa Antibiotics Swelling   Pregabalin Other (See Comments)    hallucinations  pregabalin    Review of Systems NEGATIVE UNLESS OTHERWISE INDICATED IN HPI      Objective:     BP 136/88 (BP Location: Left Arm, Patient Position: Sitting, Cuff Size: Normal)   Pulse 98   Temp (!) 97.3 F (36.3 C) (Temporal)   Ht 5' 9.5 (1.765 m)   Wt 180 lb 6.4 oz (81.8 kg)   SpO2 100%   BMI  26.26 kg/m   Wt Readings from Last 3 Encounters:  08/15/24 180 lb 6.4 oz (81.8 kg)  07/17/24 178 lb 3.2 oz (80.8 kg)  02/21/24 184 lb (83.5 kg)    BP Readings from Last 3 Encounters:  08/15/24 136/88  07/17/24 (!) 158/87  02/21/24 (!) 130/90     Physical Exam Vitals and nursing note reviewed.  Constitutional:      General: He is not in acute distress.    Appearance: Normal appearance. He is not toxic-appearing.  HENT:     Head: Normocephalic and atraumatic.     Right Ear: Tympanic membrane, ear canal and external ear normal.     Left Ear: Tympanic membrane, ear canal and external ear normal.     Nose: Nose normal.     Mouth/Throat:     Mouth: Mucous membranes are moist.     Pharynx: Oropharynx is clear.  Eyes:     Extraocular Movements: Extraocular movements intact.     Conjunctiva/sclera: Conjunctivae normal.     Pupils: Pupils are equal, round, and reactive to light.  Cardiovascular:     Rate and Rhythm: Normal rate and regular rhythm.     Pulses: Normal pulses.     Heart sounds: Normal heart sounds.  Pulmonary:     Effort: Pulmonary effort is normal.     Breath sounds: Normal breath sounds.  Abdominal:     General: Abdomen is flat. Bowel sounds are normal.     Palpations: Abdomen is soft.     Tenderness: There is no abdominal tenderness.  Musculoskeletal:     Cervical back: Normal range of motion and neck supple.     Comments: Straight spine, loss of normal curvature   Skin:    General: Skin is warm and dry.     Findings: No lesion or rash.  Neurological:     General: No focal deficit present.     Mental Status: He is alert and oriented to person, place, and time.  Psychiatric:        Mood and Affect: Mood normal.  Behavior: Behavior normal.             Gordie Belvin M Auriah Hollings, PA-C

## 2024-08-16 ENCOUNTER — Other Ambulatory Visit: Payer: Self-pay | Admitting: Physician Assistant

## 2024-08-16 MED ORDER — AMPHETAMINE-DEXTROAMPHET ER 20 MG PO CP24: 20.0000 mg | ORAL_CAPSULE | Freq: Two times a day (BID) | ORAL | 0 refills | Status: AC

## 2024-08-16 MED ORDER — AMPHETAMINE-DEXTROAMPHET ER 20 MG PO CP24: 20.0000 mg | ORAL_CAPSULE | Freq: Two times a day (BID) | ORAL | 0 refills | Status: DC

## 2024-08-16 MED ORDER — AMPHETAMINE-DEXTROAMPHET ER 20 MG PO CP24
20.0000 mg | ORAL_CAPSULE | Freq: Two times a day (BID) | ORAL | 0 refills | Status: DC
Start: 2024-09-15 — End: 2024-09-18

## 2024-08-16 NOTE — Telephone Encounter (Signed)
 Pt advised.

## 2024-08-19 ENCOUNTER — Ambulatory Visit: Payer: Self-pay | Admitting: Physician Assistant

## 2024-09-04 ENCOUNTER — Other Ambulatory Visit (HOSPITAL_BASED_OUTPATIENT_CLINIC_OR_DEPARTMENT_OTHER): Payer: Self-pay

## 2024-09-04 DIAGNOSIS — M47818 Spondylosis without myelopathy or radiculopathy, sacral and sacrococcygeal region: Secondary | ICD-10-CM | POA: Diagnosis not present

## 2024-09-04 DIAGNOSIS — Z79891 Long term (current) use of opiate analgesic: Secondary | ICD-10-CM | POA: Diagnosis not present

## 2024-09-04 DIAGNOSIS — G894 Chronic pain syndrome: Secondary | ICD-10-CM | POA: Diagnosis not present

## 2024-09-04 DIAGNOSIS — M62830 Muscle spasm of back: Secondary | ICD-10-CM | POA: Diagnosis not present

## 2024-09-04 MED ORDER — BUTALBITAL-APAP-CAFFEINE 50-325-40 MG PO TABS
ORAL_TABLET | ORAL | 1 refills | Status: AC
  Filled 2024-10-02 (×2): qty 120, fill #0
  Filled 2024-10-05: qty 120, 30d supply, fill #0
  Filled 2024-12-21: qty 120, 30d supply, fill #1

## 2024-09-06 ENCOUNTER — Other Ambulatory Visit: Payer: Self-pay

## 2024-09-08 ENCOUNTER — Other Ambulatory Visit: Payer: Self-pay | Admitting: Physician Assistant

## 2024-09-09 ENCOUNTER — Other Ambulatory Visit (HOSPITAL_BASED_OUTPATIENT_CLINIC_OR_DEPARTMENT_OTHER): Payer: Self-pay

## 2024-09-09 DIAGNOSIS — M180 Bilateral primary osteoarthritis of first carpometacarpal joints: Secondary | ICD-10-CM | POA: Diagnosis not present

## 2024-09-09 MED ORDER — ROSUVASTATIN CALCIUM 20 MG PO TABS
20.0000 mg | ORAL_TABLET | Freq: Every day | ORAL | 1 refills | Status: AC
  Filled 2024-09-09 – 2024-09-16 (×2): qty 90, 90d supply, fill #0
  Filled 2024-12-16: qty 90, 90d supply, fill #1

## 2024-09-09 NOTE — Telephone Encounter (Signed)
 Last OV: 08/15/24  Next OV: 09/18/24  Last filled: 05/07/24  Quantity: 30 w/ 3 refills

## 2024-09-11 ENCOUNTER — Other Ambulatory Visit (HOSPITAL_COMMUNITY): Payer: Self-pay

## 2024-09-11 ENCOUNTER — Telehealth: Payer: Self-pay

## 2024-09-11 NOTE — Progress Notes (Signed)
 Pharmacy Quality Measure Review  This patient is appearing on a report for being at risk of failing the adherence measure for cholesterol (statin) medications this calendar year.   Medication: rosuvastatin  20 mg Last fill date: 06/13/2024 for 90 day supply  Patient's rosuvastatin  is ready to be picked up at [facility]. Attempted to reach the patient to encourage pickup from the pharmacy. Left voicemail for patient to return my call at their convenience.   Woodie Jock, PharmD PGY1 Pharmacy Resident  09/11/2024

## 2024-09-13 NOTE — Progress Notes (Signed)
 The patient called back on 10/24. He states that he is taking his rosuvastatin  as prescribed and has approximately 7-8 pills remaining. He is aware that his medications are ready to be picked up from the pharmacy, and he plans on picking them up in the next couple of days. He denies any concerns or missed doses. He is due for follow-up with his PCP, and he plans on scheduling a visit through MyChart later today. No further action is required at this time, pending pickupfrom the pharmacy.   Woodie Jock, PharmD PGY1 Pharmacy Resident  09/13/2024

## 2024-09-16 ENCOUNTER — Other Ambulatory Visit (HOSPITAL_BASED_OUTPATIENT_CLINIC_OR_DEPARTMENT_OTHER): Payer: Self-pay

## 2024-09-16 ENCOUNTER — Encounter: Payer: Self-pay | Admitting: Family Medicine

## 2024-09-16 ENCOUNTER — Ambulatory Visit (INDEPENDENT_AMBULATORY_CARE_PROVIDER_SITE_OTHER): Admitting: Family Medicine

## 2024-09-16 VITALS — BP 136/82 | HR 95 | Temp 98.4°F | Ht 69.5 in | Wt 181.2 lb

## 2024-09-16 DIAGNOSIS — I10 Essential (primary) hypertension: Secondary | ICD-10-CM | POA: Diagnosis not present

## 2024-09-16 DIAGNOSIS — J040 Acute laryngitis: Secondary | ICD-10-CM | POA: Diagnosis not present

## 2024-09-16 DIAGNOSIS — J029 Acute pharyngitis, unspecified: Secondary | ICD-10-CM

## 2024-09-16 LAB — POCT INFLUENZA A/B
Influenza A, POC: NEGATIVE
Influenza B, POC: NEGATIVE

## 2024-09-16 LAB — POC COVID19 BINAXNOW: SARS Coronavirus 2 Ag: NEGATIVE

## 2024-09-16 LAB — POCT RAPID STREP A (OFFICE): Rapid Strep A Screen: NEGATIVE

## 2024-09-16 NOTE — Progress Notes (Signed)
 Phone [phone] In person visit   Subjective:   Jeremiah Anderson is a 59 y.o. year old very pleasant male patient who presents for/with See problem oriented charting Chief Complaint  Patient presents with   Sore Throat    Started Saturday night;    Otalgia    Bilateral ear pain, right ear hurts worse than left;    Past Medical History-  Patient Active Problem List   Diagnosis Date Noted   Chronic pain syndrome 08/09/2024   Pain in both hands 08/08/2024   Primary arthrosis of first carpometacarpal joints, bilateral 08/08/2024   Palpable mass of lower back 07/21/2023   Encounter for annual physical exam 07/21/2023   Spinal stenosis, cervical region 03/15/2023   Chronic left shoulder pain 12/22/2022   Meningioma (HCC) 10/07/2022   Seizure (HCC) 10/07/2022   Testosterone  deficiency 11/04/2020   Vitamin D  deficiency 11/04/2020   Attention deficit hyperactivity disorder (ADHD), combined type 11/04/2020   S/P foot surgery, right 10/20/2020   Morton's neuroma of right foot 08/05/2020   Elevated LFTs 03/23/2020   Fusion of spine of lumbar region 10/31/2019   Spondylolysis of cervical region 10/31/2019   Chronic, continuous use of opioids 07/17/2019   Facet arthropathy, cervical 07/17/2019   Neck pain 12/17/2017   Patellar tendinitis of right knee 08/18/2017   Medicare annual wellness visit, subsequent 01/27/2017   Abscess of nasal cavity 06/14/2016   Right flank mass 06/14/2016   Tobacco use disorder 06/14/2016   Overweight (BMI 25.0-29.9) 06/14/2016   Sinusitis, chronic 08/12/2015   Arthralgia 07/06/2015   Chronic back pain 06/10/2015   Multiple duodenal ulcers 06/10/2015   Hyperlipidemia 05/07/2015   Migraines 04/21/2015   Essential hypertension 02/16/2015   Impingement syndrome of shoulder 08/07/2014   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 90 tablet 1   [START ON 10/15/2024] amphetamine -dextroamphetamine  (ADDERALL  XR) 20 MG 24 hr capsule Take 1 capsule (20 mg total) by mouth in the morning and at bedtime. 60 capsule 0   amphetamine -dextroamphetamine  (ADDERALL  XR) 20 MG 24 hr capsule Take 1 capsule (20 mg total) by mouth in the morning and at bedtime. 60 capsule 0   Baclofen  5 MG TABS Take 1 tablet by mouth 3 (three) times daily.     butalbital -acetaminophen -caffeine  (BAC, BUTALBITAL -ACETAMIN-CAFF,) 50-325-40 MG tablet Take 1 tablet by mouth every six hours as needed 120 tablet 1   Cholecalciferol (VITAMIN D3) 25 MCG (1000 UT) CAPS Take by mouth daily.     Coenzyme Q10 (COQ10) 100 MG CAPS Take 100 mg by mouth daily.     cyanocobalamin 100 MCG tablet Take 100 mcg by mouth daily.     fluorouracil (EFUDEX) 5 % cream Apply topically 2 (two) times daily.     fluticasone  (FLONASE ) 50 MCG/ACT nasal spray Place 2 sprays into both nostrils daily. 16 g 11   Lidocaine  4 % PTCH Place 1-2 patches onto the skin 2 (two) times daily as needed (for pain.). Salonpas Maximum Strength Pain Relieving Gel-Patch     meloxicam  (MOBIC ) 15 MG tablet Take 1 tablet (15 mg total) by mouth daily as needed. 30 tablet 2   morphine  (MSIR) 30 MG tablet Take 30 mg by mouth every 4 (four) hours as needed.     Multiple Vitamin (MULTIVITAMIN WITH MINERALS) TABS tablet Take 1 tablet by mouth daily.  naloxone  (NARCAN ) nasal spray 4 mg/0.1 mL Place into the nose. (Patient taking differently: Place into the nose. Only used if needed)     ondansetron  (ZOFRAN ) 4 MG tablet TAKE 1 TABLET BY MOUTH EVERY 8 HOURS AS NEEDED FOR NAUSEA AND VOMITING 30 tablet 3   rosuvastatin  (CRESTOR ) 20 MG tablet Take 1 tablet (20 mg total) by mouth daily. 90 tablet 1   SUMAtriptan  (IMITREX ) 100 MG tablet MAY REPEAT IN 2 HOURS IF HEADACHE PERSISTS OR RECURS. 9 tablet 3   tazarotene (TAZORAC) 0.1 % gel APPLY A SMALL AMOUNT TO AFFECTED AREA AT  BEDTIME     testosterone  cypionate (DEPOTESTOSTERONE CYPIONATE) 200 MG/ML injection Inject 0.5 mLs (100 mg total) into the muscle once a week. 10 mL 1   Omega-3 Fatty Acids (OMEGA 3 PO) Take 120 mg by mouth daily. (Patient not taking: Reported on 09/16/2024)     No current facility-administered medications for this visit.     Objective:  BP 136/82 (BP Location: Left Arm, Patient Position: Sitting, Cuff Size: Normal)   Pulse 95   Temp 98.4 F (36.9 C) (Temporal)   Ht 5' 9.5 (1.765 m)   Wt 181 lb 3.2 oz (82.2 kg)   SpO2 99%   BMI 26.37 kg/m  Gen: NAD, resting comfortably but hoarse voice Tympanic membrane is normal bilaterally, pharynx with mild erythema, nasal turbinates mildly edematous CV: RRR no murmurs rubs or gallops Lungs: CTAB no crackles, wheeze, rhonchi Abdomen: soft/nontender/nondistended/normal bowel sounds. No rebound or guarding.  Ext: no edema Skin: warm, dry  Results for orders placed or performed in visit on 09/16/24 (from the past 24 hours)  POCT Influenza A/B     Status: None   Collection Time: 09/16/24  4:25 PM  Result Value Ref Range   Influenza A, POC Negative Negative   Influenza B, POC Negative Negative  POC COVID-19     Status: None   Collection Time: 09/16/24  4:25 PM  Result Value Ref Range   SARS Coronavirus 2 Ag Negative Negative  POCT rapid strep A     Status: None   Collection Time: 09/16/24  4:25 PM  Result Value Ref Range   Rapid Strep A Screen Negative Negative       Assessment and Plan   # Sore throat/bilateral ear pain S: Sore throat started on Saturday night October 25.  He has also developed bilateral ear pain with the right ear hurting worse than the left side- starting last night.  -at first thought it could be GERD -also has lost his voice- gradually improving - takes fiorcet for headaches in general after brain tumor - post craniotomy headaches and wondered fi that could mask something as well -No fever/chills/cough/congestion   A/P: Upper respiratory infection (URI) likely settling in pharyx and larynx- resting your voice is one of the best things you can do to heal. Also staying hydrated and using humidifier.   New or worsening symptoms see us  back- particularly fever, shortness of breath   Flu, COVID, strep negative   #hypertension S: medication: Amlodipine  10 mg BP Readings from Last 3 Encounters:  09/16/24 136/82  08/15/24 136/88  07/17/24 (!) 158/87  A/P: high acceptable- continue current medications - but feels rushed over here  Recommended follow up: Return for as needed for new, worsening, persistent symptoms. Future Appointments  Date Time Provider Department Center  09/18/2024  3:40 PM [facility] ANNUAL WELLNESS VISIT 1 [facility]  11/19/2024 10:00 AM Allwardt, Mardy HERO, PA-C [facility]  [place]    Lab/Order associations:   ICD-10-CM   1. Laryngitis  J04.0     2. Sore throat  J02.9 POCT Influenza A/B    POC COVID-19    POCT rapid strep A    3. Essential hypertension  I10       No orders of the defined types were placed in this encounter.   Return precautions advised.  Garnette Lukes, MD

## 2024-09-16 NOTE — Patient Instructions (Addendum)
 Upper respiratory infection (URI) likely settling in pharyx and larynx- resting your voice is one of the best things you can do to heal. Also staying hydrated and using humidifier.   New or worsening symptoms see us  back- particularly fever, shortness of breath   Flu, COVID, strep negative  Recommended follow up: Return for as needed for new, worsening, persistent symptoms.

## 2024-09-18 ENCOUNTER — Ambulatory Visit

## 2024-09-18 VITALS — Ht 69.5 in | Wt 181.0 lb

## 2024-09-18 DIAGNOSIS — Z0000 Encounter for general adult medical examination without abnormal findings: Secondary | ICD-10-CM | POA: Diagnosis not present

## 2024-09-18 NOTE — Progress Notes (Signed)
 Subjective:   Jeremiah Anderson is a 58 y.o. who presents for a Medicare Wellness preventive visit.  As a reminder, Annual Wellness Visits don't include a physical exam, and some assessments may be limited, especially if this visit is performed virtually. We may recommend an in-person follow-up visit with your provider if needed.  Visit Complete: Virtual I connected with  Jeremiah Anderson on 09/18/24 by a audio enabled telemedicine application and verified that I am speaking with the correct person using two identifiers.  Patient Location: Home  Provider Location: Home Office  I discussed the limitations of evaluation and management by telemedicine. The patient expressed understanding and agreed to proceed.  Vital Signs: Because this visit was a virtual/telehealth visit, some criteria may be missing or patient reported. Any vitals not documented were not able to be obtained and vitals that have been documented are patient reported.  VideoDeclined- This patient declined Librarian, academic. Therefore the visit was completed with audio only.  Persons Participating in Visit: Patient.  AWV Questionnaire: Yes: Patient Medicare AWV questionnaire was completed by the patient on 09/14/24 forwarded with no change ; I have confirmed that all information answered by patient is correct and no changes since this date.  Cardiac Risk Factors include: advanced age (>27men, >73 women);male gender;dyslipidemia;hypertension;smoking/ tobacco exposure     Objective:    Today's Vitals   09/14/24 1615 09/18/24 1537 09/18/24 1554  Weight:  181 lb (82.1 kg)   Height:  5' 9.5 (1.765 m)   PainSc: 4   4    Body mass index is 26.35 kg/m.     09/18/2024    3:43 PM 02/06/2024    5:48 PM 04/25/2023    2:59 PM 04/20/2023    3:45 PM 10/18/2016    1:40 PM 06/10/2015    1:00 AM  Advanced Directives  Does Patient Have a Medical Advance Directive? No No No  No  Yes   Type of Advance  Directive      Living will;Healthcare Power of Attorney   Does patient want to make changes to medical advance directive?      No - Patient declined   Would patient like information on creating a medical advance directive? No - Patient declined Yes (MAU/Ambulatory/Procedural Areas - Information given) No - Patient declined  Yes (MAU/Ambulatory/Procedural Areas - Information given)       Information is confidential and restricted. Go to Review Flowsheets to unlock data.   Data saved with a previous flowsheet row definition    Current Medications (verified) Outpatient Encounter Medications as of 09/18/2024  Medication Sig   amLODipine  (NORVASC ) 10 MG tablet Take 1 tablet (10 mg total) by mouth daily.   [START ON 10/15/2024] amphetamine -dextroamphetamine  (ADDERALL  XR) 20 MG 24 hr capsule Take 1 capsule (20 mg total) by mouth in the morning and at bedtime.   Baclofen  5 MG TABS Take 1 tablet by mouth 3 (three) times daily.   butalbital -acetaminophen -caffeine  (BAC, BUTALBITAL -ACETAMIN-CAFF,) 50-325-40 MG tablet Take 1 tablet by mouth every six hours as needed   Cholecalciferol (VITAMIN D3) 25 MCG (1000 UT) CAPS Take by mouth daily.   Coenzyme Q10 (COQ10) 100 MG CAPS Take 100 mg by mouth daily.   cyanocobalamin 100 MCG tablet Take 100 mcg by mouth daily.   fluorouracil (EFUDEX) 5 % cream Apply topically 2 (two) times daily.   fluticasone  (FLONASE ) 50 MCG/ACT nasal spray Place 2 sprays into both nostrils daily.   Lidocaine  4 % PTCH Place 1-2 patches onto  the skin 2 (two) times daily as needed (for pain.). Salonpas Maximum Strength Pain Relieving Gel-Patch   meloxicam  (MOBIC ) 15 MG tablet Take 1 tablet (15 mg total) by mouth daily as needed.   morphine  (MSIR) 30 MG tablet Take 30 mg by mouth every 4 (four) hours as needed.   Multiple Vitamin (MULTIVITAMIN WITH MINERALS) TABS tablet Take 1 tablet by mouth daily.   ondansetron  (ZOFRAN ) 4 MG tablet TAKE 1 TABLET BY MOUTH EVERY 8 HOURS AS NEEDED FOR  NAUSEA AND VOMITING   rosuvastatin  (CRESTOR ) 20 MG tablet Take 1 tablet (20 mg total) by mouth daily.   SUMAtriptan  (IMITREX ) 100 MG tablet MAY REPEAT IN 2 HOURS IF HEADACHE PERSISTS OR RECURS.   tazarotene (TAZORAC) 0.1 % gel APPLY A SMALL AMOUNT TO AFFECTED AREA AT BEDTIME   testosterone  cypionate (DEPOTESTOSTERONE CYPIONATE) 200 MG/ML injection Inject 0.5 mLs (100 mg total) into the muscle once a week.   naloxone  (NARCAN ) nasal spray 4 mg/0.1 mL Place into the nose. (Patient not taking: Reported on 09/18/2024)   [DISCONTINUED] amphetamine -dextroamphetamine  (ADDERALL  XR) 20 MG 24 hr capsule Take 1 capsule (20 mg total) by mouth in the morning and at bedtime.   [DISCONTINUED] Omega-3 Fatty Acids (OMEGA 3 PO) Take 120 mg by mouth daily. (Patient not taking: Reported on 09/16/2024)   No facility-administered encounter medications on file as of 09/18/2024.    Allergies (verified) Lisinopril, Fentanyl , Metoprolol , Sulfa antibiotics, and Pregabalin   History: Past Medical History:  Diagnosis Date   Allergy    Arthritis    Blood transfusion without reported diagnosis    around age 27 after major nausea secondary to prescribed MS Contin    GERD (gastroesophageal reflux disease)    Hyperlipidemia    Hypertension    Migraines    Syncope and collapse 11/22   I got some head rushes   Ulcer 11/21/2008   Not actually an ulcer but stomach irritation to the point could not hold down food or take medication until treated. I take medication for Thalia and have not had the problem since   Wears glasses    Past Surgical History:  Procedure Laterality Date   APPENDECTOMY  1980s   post op bleeding sent him back for ex lap and hemostatic intervention.    BACK SURGERY     8 surgeries total - fused from L1-S1   BRAIN SURGERY     COLONOSCOPY     Age 51. For bleeding   ESOPHAGOGASTRODUODENOSCOPY N/A 06/10/2015   Procedure: ESOPHAGOGASTRODUODENOSCOPY (EGD);  Surgeon: Princella CHRISTELLA Nida, MD;  [facility]  ENDOSCOPY;  Service: Endoscopy;  Laterality: N/A;   ex lap with peri gastric vessel ligation  1990s   after retching, he rupture a blood vessel outside the stomach.  vessel was ligated per pt's descriiption.    EYE SURGERY  2018   Laser surgery to fix retina tears   HERNIA REPAIR  2019   Flank hernia right side from where surgeon cut open my side to do the back fusion surgery and it never healed correctly   INCISIONAL HERNIA REPAIR Right 10/21/2016   Procedure: OPEN [facility] HERNIA REPAIR WITH MESH;  Surgeon: Lynda Leos, MD;  Location: [facility] OR;  Service: General;  Laterality: Right;   INSERTION OF MESH Right 10/21/2016   Procedure: INSERTION OF MESH;  Surgeon: Lynda Leos, MD;  Location: [facility] OR;  Service: General;  Laterality: Right;   JOINT REPLACEMENT     KNEE SURGERY     Multiple bilateral scopes  RETINAL DETACHMENT SURGERY Right    SHOULDER SURGERY     4x on the left - total shoulder replacement.    SHOULDER SURGERY Left    x2   SPINE SURGERY  First 1990   9 surgeries total, I am fused from S1-L1 and now have flat back syndrome   TOTAL SHOULDER ARTHROPLASTY Left    x2   Family History  Problem Relation Age of Onset   Arthritis Mother    Hyperlipidemia Mother    Hypertension Father    Cancer Father    Skin cancer Father    Colon cancer Maternal Grandmother    Cancer Maternal Grandmother    Hypertension Sister    Hypertension Paternal Grandmother    Heart attack Paternal Grandfather    Heart disease Paternal Grandfather    Hypertension Paternal Grandfather    Diabetes Neg Hx    Stroke Neg Hx    Prostate cancer Neg Hx    Social History   Socioeconomic History   Marital status: Single    Spouse name: Not on file   Number of children: 0   Years of education: 16   Highest education level: Bachelor's degree (e.g., BA, AB, BS)  Occupational History   Occupation: Disability    Comment: Former [employer]  Tobacco Use   Smoking status: Former    Types: Cigarettes    Smokeless tobacco: Never  Vaping Use   Vaping status: Never Used  Substance and Sexual Activity   Alcohol use: Not Currently   Drug use: No   Sexual activity: Yes    Birth control/protection: Coitus interruptus, Condom, Diaphragm, Pill  Other Topics Concern   Not on file  Social History Narrative   Fun: play guitar / limited funds      Denies any religious beliefs effecting health care.    Social Drivers of Corporate Investment Banker Strain: Low Risk  (09/18/2024)   Overall Financial Resource Strain (CARDIA)    Difficulty of Paying Living Expenses: Not hard at all  Food Insecurity: No Food Insecurity (09/18/2024)   Hunger Vital Sign    Worried About Running Out of Food in the Last Year: Never true    Ran Out of Food in the Last Year: Never true  Transportation Needs: No Transportation Needs (09/18/2024)   PRAPARE - Administrator, Civil Service (Medical): No    Lack of Transportation (Non-Medical): No  Physical Activity: Sufficiently Active (09/18/2024)   Exercise Vital Sign    Days of Exercise per Week: 4 days    Minutes of Exercise per Session: 60 min  Stress: No Stress Concern Present (09/18/2024)   [employer] of Occupational Health - Occupational Stress Questionnaire    Feeling of Stress: Only a little  Social Connections: Moderately Isolated (09/18/2024)   Social Connection and Isolation Panel    Frequency of Communication with Friends and Family: More than three times a week    Frequency of Social Gatherings with Friends and Family: Once a week    Attends Religious Services: Patient declined    Database Administrator or Organizations: No    Attends Engineer, Structural: Not on file    Marital Status: Living with partner    Tobacco Counseling Counseling given: Not Answered    Clinical Intake:  Pre-visit preparation completed: Yes  Pain : 0-10 Pain Score: 4  Pain Type: Chronic pain Pain Location: Generalized Pain Descriptors /  Indicators: Aching Pain Onset: More than a month ago  Pain Frequency: Constant     BMI - recorded: 26.35 Nutritional Status: BMI 25 -29 Overweight Diabetes: No  Lab Results  Component Value Date   HGBA1C 5.8 08/15/2024   HGBA1C 5.6 07/21/2023     How often do you need to have someone help you when you read instructions, pamphlets, or other written materials from your doctor or pharmacy?: 1 - Never  Interpreter Needed?: No  Information entered by :: Ellouise Haws, LPN   Activities of Daily Living     09/18/2024    3:55 PM 09/14/2024    4:15 PM  In your present state of health, do you have any difficulty performing the following activities:  Hearing? 0 0  Vision? 0 0  Difficulty concentrating or making decisions? 0 0  Walking or climbing stairs? 0 0  Dressing or bathing? 0 0  Doing errands, shopping? 0 0  Preparing Food and eating ? N N  Using the Toilet? N N  In the past six months, have you accidently leaked urine? N N  Do you have problems with loss of bowel control? N N  Managing your Medications? N N  Managing your Finances? N N  Housekeeping or managing your Housekeeping? N N    Patient Care Team: Allwardt, [name], PA-C as PCP - General (Physician Assistant) Lonni Slain, MD as PCP - Cardiology (Cardiology) Watt Rush, MD as Attending Physician (Urology)  I have updated your Care Teams any recent Medical Services you may have received from other providers in the past year.     Assessment:   This is a routine wellness examination for Jeremiah Anderson.  Hearing/Vision screen Hearing Screening - Comments:: Pt denies any hearing issues  Vision Screening - Comments:: Wears rx glasses - up to date with routine eye exams with [facility]    Goals Addressed               This Visit's Progress     continue to stay mobile and less surgery (pt-stated)         Depression Screen     09/18/2024    3:44 PM 09/16/2024    4:12 PM 08/15/2024   10:45  AM 08/24/2023   10:05 AM 07/21/2023   10:57 AM 04/25/2023    2:58 PM 12/22/2022   10:39 AM  PHQ 2/9 Scores  PHQ - 2 Score 0 0 0 1 2 0 1  PHQ- 9 Score 0 0  6 5  3     Fall Risk     09/18/2024    3:57 PM 09/16/2024    4:12 PM 09/14/2024    4:15 PM 08/15/2024   10:45 AM 08/24/2023   10:05 AM  Fall Risk   Falls in the past year? 0 0 0 0 0  Number falls in past yr: 0 0 0 0 0  Injury with Fall? 0 0 0 0 0  Risk for fall due to : No Fall Risks No Fall Risks No Fall Risks No Fall Risks No Fall Risks  Follow up Falls prevention discussed Falls evaluation completed Falls prevention discussed Falls evaluation completed Falls evaluation completed    MEDICARE RISK AT HOME:  Medicare Risk at Home Any stairs in or around the home?: Yes If so, are there any without handrails?: No Home free of loose throw rugs in walkways, pet beds, electrical cords, etc?: Yes Adequate lighting in your home to reduce risk of falls?: Yes Life alert?: No Use of a cane, walker or w/c?: No  Grab bars in the bathroom?: No Shower chair or bench in shower?: No Elevated toilet seat or a handicapped toilet?: No  TIMED UP AND GO:  Was the test performed?  No  Cognitive Function: 6CIT completed        09/18/2024    3:48 PM 04/25/2023    3:02 PM  6CIT Screen  What Year? 0 points 0 points  What month? 0 points 0 points  What time? 0 points 0 points  Count back from 20 0 points 0 points  Months in reverse 0 points 0 points  Repeat phrase 0 points 0 points  Total Score 0 points 0 points    Immunizations Immunization History  Administered Date(s) Administered   Hepatitis B 05/31/2021   Hepatitis B, ADULT 05/31/2021   Influenza, Seasonal, Injecte, Preservative Fre 08/15/2024   Influenza,inj,Quad PF,6+ Mos 09/15/2022   PFIZER(Purple Top)SARS-COV-2 Vaccination 02/09/2020, 03/01/2020, 09/29/2020   PPD Test 01/22/2018, 05/31/2021   Pfizer Covid-19 Vaccine Bivalent Booster 86yrs & up 02/23/2022, 09/15/2022   Td  11/18/2017   Tdap 11/18/2017   Zoster Recombinant(Shingrix ) 04/11/2022, 03/15/2023    Screening Tests Health Maintenance  Topic Date Due   COVID-19 Vaccine (6 - 2025-26 season) 10/02/2024 (Originally 07/22/2024)   Pneumococcal Vaccine: 50+ Years (1 of 1 - PCV) 08/15/2025 (Originally 04/30/2016)   Hepatitis B Vaccines 19-59 Average Risk (2 of 3 - 19+ 3-dose series) 08/15/2025 (Originally 06/28/2021)   Medicare Annual Wellness (AWV)  09/18/2025   Colonoscopy  04/06/2027   DTaP/Tdap/Td (3 - Td or Tdap) 11/19/2027   Influenza Vaccine  Completed   Hepatitis C Screening  Completed   HIV Screening  Completed   Zoster Vaccines- Shingrix   Completed   HPV VACCINES  Aged Out   Meningococcal B Vaccine  Aged Out    Health Maintenance Items Addressed: See Nurse Notes at the end of this note  Additional Screening:  Vision Screening: Recommended annual ophthalmology exams for early detection of glaucoma and other disorders of the eye. Is the patient up to date with their annual eye exam?  Yes  Who is the provider or what is the name of the office in which the patient attends annual eye exams? [facility]  Dental Screening: Recommended annual dental exams for proper oral hygiene  Community Resource Referral / Chronic Care Management: CRR required this visit?  No   CCM required this visit?  No   Plan:    I have personally reviewed and noted the following in the patient's chart:   Medical and social history Use of alcohol, tobacco or illicit drugs  Current medications and supplements including opioid prescriptions. Patient is currently taking opioid prescriptions. Information provided to patient regarding non-opioid alternatives. Patient advised to discuss non-opioid treatment plan with their provider. Functional ability and status Nutritional status Physical activity Advanced directives List of other physicians Hospitalizations, surgeries, and ER visits in previous 12  months Vitals Screenings to include cognitive, depression, and falls Referrals and appointments  In addition, I have reviewed and discussed with patient certain preventive protocols, quality metrics, and best practice recommendations. A written personalized care plan for preventive services as well as general preventive health recommendations were provided to patient.   Ellouise VEAR Haws, LPN   [id number]   After Visit Summary: (MyChart) Due to this being a telephonic visit, the after visit summary with patients personalized plan was offered to patient via MyChart   Notes: Nothing significant to report at this time.

## 2024-09-18 NOTE — Patient Instructions (Signed)
 [name],  Thank you for taking the time for your Medicare Wellness Visit. I appreciate your continued commitment to your health goals. Please review the care plan we discussed, and feel free to reach out if I can assist you further.  Medicare recommends these wellness visits once per year to help you and your care team stay ahead of potential health issues. These visits are designed to focus on prevention, allowing your provider to concentrate on managing your acute and chronic conditions during your regular appointments.  Please note that Annual Wellness Visits do not include a physical exam. Some assessments may be limited, especially if the visit was conducted virtually. If needed, we may recommend a separate in-person follow-up with your provider.  Ongoing Care Seeing your primary care provider every 3 to 6 months helps us  monitor your health and provide consistent, personalized care.   Referrals If a referral was made during today's visit and you haven't received any updates within two weeks, please contact the referred provider directly to check on the status.  Recommended Screenings:  Health Maintenance  Topic Date Due   COVID-19 Vaccine (6 - 2025-26 season) 10/02/2024*   Pneumococcal Vaccine for age over 74 (1 of 1 - PCV) 08/15/2025*   Hepatitis B Vaccine (2 of 3 - 19+ 3-dose series) 08/15/2025*   Medicare Annual Wellness Visit  09/18/2025   Colon Cancer Screening  04/06/2027   DTaP/Tdap/Td vaccine (3 - Td or Tdap) 11/19/2027   Flu Shot  Completed   Hepatitis C Screening  Completed   HIV Screening  Completed   Zoster (Shingles) Vaccine  Completed   HPV Vaccine  Aged Out   Meningitis B Vaccine  Aged Out  *Topic was postponed. The date shown is not the original due date.       09/18/2024    3:43 PM  Advanced Directives  Does Patient Have a Medical Advance Directive? No  Would patient like information on creating a medical advance directive? No - Patient declined    Advance Care Planning is important because it: Ensures you receive medical care that aligns with your values, goals, and preferences. Provides guidance to your family and loved ones, reducing the emotional burden of decision-making during critical moments.  Vision: Annual vision screenings are recommended for early detection of glaucoma, cataracts, and diabetic retinopathy. These exams can also reveal signs of chronic conditions such as diabetes and high blood pressure.  Dental: Annual dental screenings help detect early signs of oral cancer, gum disease, and other conditions linked to overall health, including heart disease and diabetes.  Please see the attached documents for additional preventive care recommendations.

## 2024-09-19 ENCOUNTER — Encounter (HOSPITAL_COMMUNITY): Payer: Self-pay

## 2024-09-19 ENCOUNTER — Ambulatory Visit (HOSPITAL_COMMUNITY): Admitting: Psychiatry

## 2024-10-02 ENCOUNTER — Other Ambulatory Visit (HOSPITAL_BASED_OUTPATIENT_CLINIC_OR_DEPARTMENT_OTHER): Payer: Self-pay

## 2024-10-03 ENCOUNTER — Other Ambulatory Visit (HOSPITAL_BASED_OUTPATIENT_CLINIC_OR_DEPARTMENT_OTHER): Payer: Self-pay

## 2024-10-05 ENCOUNTER — Other Ambulatory Visit (HOSPITAL_BASED_OUTPATIENT_CLINIC_OR_DEPARTMENT_OTHER): Payer: Self-pay

## 2024-10-07 ENCOUNTER — Encounter: Payer: Self-pay | Admitting: Physician Assistant

## 2024-10-08 NOTE — Telephone Encounter (Signed)
 Please see pt msg and advise

## 2024-10-28 ENCOUNTER — Other Ambulatory Visit (HOSPITAL_BASED_OUTPATIENT_CLINIC_OR_DEPARTMENT_OTHER): Payer: Self-pay

## 2024-10-28 MED ORDER — BUTALBITAL-APAP-CAFFEINE 50-325-40 MG PO TABS
1.0000 | ORAL_TABLET | Freq: Four times a day (QID) | ORAL | 1 refills | Status: DC | PRN
  Filled 2024-11-27: qty 120, 30d supply, fill #1
  Filled ????-??-??: fill #0

## 2024-10-31 ENCOUNTER — Other Ambulatory Visit (HOSPITAL_BASED_OUTPATIENT_CLINIC_OR_DEPARTMENT_OTHER): Payer: Self-pay

## 2024-11-01 ENCOUNTER — Other Ambulatory Visit (HOSPITAL_BASED_OUTPATIENT_CLINIC_OR_DEPARTMENT_OTHER): Payer: Self-pay

## 2024-11-01 ENCOUNTER — Other Ambulatory Visit: Payer: Self-pay | Admitting: Physician Assistant

## 2024-11-01 ENCOUNTER — Other Ambulatory Visit: Payer: Self-pay

## 2024-11-01 MED ORDER — FLUTICASONE PROPIONATE 50 MCG/ACT NA SUSP
2.0000 | Freq: Every day | NASAL | 11 refills | Status: AC
  Filled 2024-11-01: qty 16, 30d supply, fill #0
  Filled 2024-11-27: qty 16, 30d supply, fill #1

## 2024-11-01 MED ORDER — AMLODIPINE BESYLATE 10 MG PO TABS
10.0000 mg | ORAL_TABLET | Freq: Every day | ORAL | 1 refills | Status: AC
  Filled 2024-11-01: qty 90, 90d supply, fill #0

## 2024-11-02 ENCOUNTER — Other Ambulatory Visit (HOSPITAL_BASED_OUTPATIENT_CLINIC_OR_DEPARTMENT_OTHER): Payer: Self-pay

## 2024-11-07 ENCOUNTER — Encounter: Payer: Self-pay | Admitting: Physician Assistant

## 2024-11-08 ENCOUNTER — Other Ambulatory Visit: Payer: Self-pay

## 2024-11-08 DIAGNOSIS — F902 Attention-deficit hyperactivity disorder, combined type: Secondary | ICD-10-CM

## 2024-11-08 MED ORDER — AMPHETAMINE-DEXTROAMPHET ER 20 MG PO CP24: 20.0000 mg | ORAL_CAPSULE | Freq: Two times a day (BID) | ORAL | 0 refills | Status: DC

## 2024-11-08 NOTE — Telephone Encounter (Signed)
 Duplicate msg, see previous MyChart message

## 2024-11-08 NOTE — Telephone Encounter (Signed)
 Rx sent to pharmacy

## 2024-11-08 NOTE — Telephone Encounter (Signed)
 Telephone encounter sent for refill

## 2024-11-08 NOTE — Telephone Encounter (Signed)
 Last OV: 09/16/24  Next OV: 11/19/24  Last filled: 10/15/24  Quantity: 60

## 2024-11-11 ENCOUNTER — Emergency Department (HOSPITAL_BASED_OUTPATIENT_CLINIC_OR_DEPARTMENT_OTHER): Admitting: Radiology

## 2024-11-11 ENCOUNTER — Emergency Department (HOSPITAL_BASED_OUTPATIENT_CLINIC_OR_DEPARTMENT_OTHER)
Admission: EM | Admit: 2024-11-11 | Discharge: 2024-11-12 | Disposition: A | Discharge status: Home / Self Care | Attending: Emergency Medicine | Admitting: Emergency Medicine

## 2024-11-11 ENCOUNTER — Encounter (HOSPITAL_BASED_OUTPATIENT_CLINIC_OR_DEPARTMENT_OTHER): Payer: Self-pay | Admitting: Emergency Medicine

## 2024-11-11 ENCOUNTER — Other Ambulatory Visit: Payer: Self-pay

## 2024-11-11 DIAGNOSIS — I10 Essential (primary) hypertension: Secondary | ICD-10-CM | POA: Diagnosis not present

## 2024-11-11 DIAGNOSIS — Z79899 Other long term (current) drug therapy: Secondary | ICD-10-CM | POA: Insufficient documentation

## 2024-11-11 DIAGNOSIS — R1013 Epigastric pain: Secondary | ICD-10-CM | POA: Diagnosis present

## 2024-11-11 DIAGNOSIS — R112 Nausea with vomiting, unspecified: Secondary | ICD-10-CM | POA: Diagnosis not present

## 2024-11-11 LAB — CBC
HCT: 43.8 % (ref 39.0–52.0)
Hemoglobin: 14.7 g/dL (ref 13.0–17.0)
MCH: 29.2 pg (ref 26.0–34.0)
MCHC: 33.6 g/dL (ref 30.0–36.0)
MCV: 86.9 fL (ref 80.0–100.0)
Platelets: 273 K/uL (ref 150–400)
RBC: 5.04 MIL/uL (ref 4.22–5.81)
RDW: 12.2 % (ref 11.5–15.5)
WBC: 10.9 K/uL — ABNORMAL HIGH (ref 4.0–10.5)
nRBC: 0 % (ref 0.0–0.2)

## 2024-11-11 LAB — COMPREHENSIVE METABOLIC PANEL WITH GFR
ALT: 40 U/L (ref 0–44)
AST: 36 U/L (ref 15–41)
Albumin: 4.6 g/dL (ref 3.5–5.0)
Alkaline Phosphatase: 73 U/L (ref 38–126)
Anion gap: 12 (ref 5–15)
BUN: 24 mg/dL — ABNORMAL HIGH (ref 6–20)
CO2: 27 mmol/L (ref 22–32)
Calcium: 9.7 mg/dL (ref 8.9–10.3)
Chloride: 98 mmol/L (ref 98–111)
Creatinine, Ser: 1.11 mg/dL (ref 0.61–1.24)
GFR, Estimated: >60 mL/min
Glucose, Bld: 99 mg/dL (ref 70–99)
Potassium: 4 mmol/L (ref 3.5–5.1)
Sodium: 137 mmol/L (ref 135–145)
Total Bilirubin: 0.5 mg/dL (ref 0.0–1.2)
Total Protein: 7.3 g/dL (ref 6.5–8.1)

## 2024-11-11 LAB — URINALYSIS, ROUTINE W REFLEX MICROSCOPIC
Bacteria, UA: NONE SEEN
Glucose, UA: NEGATIVE mg/dL
Hgb urine dipstick: NEGATIVE
Ketones, ur: 15 mg/dL — AB
Leukocytes,Ua: NEGATIVE
Nitrite: NEGATIVE
Protein, ur: 30 mg/dL — AB
Specific Gravity, Urine: 1.042 — ABNORMAL HIGH (ref 1.005–1.030)
pH: 6 (ref 5.0–8.0)

## 2024-11-11 LAB — TROPONIN T, HIGH SENSITIVITY
Troponin T High Sensitivity: 33 ng/L — ABNORMAL HIGH (ref 0–19)
Troponin T High Sensitivity: 28 ng/L — ABNORMAL HIGH (ref 0–19)

## 2024-11-11 LAB — LIPASE, BLOOD: Lipase: 18 U/L (ref 11–51)

## 2024-11-11 NOTE — ED Notes (Signed)
UA cup given

## 2024-11-11 NOTE — ED Triage Notes (Signed)
 Emesis Saturday night into Sunday. Has zofran  at home. Zofran  has not been helping, emesis after eating. Pain in epigastric area and back.    Scheduled for thumb surgery tomorrow-has been cancelled. Chronic pain of back- takes morphine .

## 2024-11-12 ENCOUNTER — Telehealth: Payer: Self-pay

## 2024-11-12 ENCOUNTER — Emergency Department (HOSPITAL_BASED_OUTPATIENT_CLINIC_OR_DEPARTMENT_OTHER)

## 2024-11-12 LAB — TROPONIN T, HIGH SENSITIVITY: Troponin T High Sensitivity: 26 ng/L — ABNORMAL HIGH (ref 0–19)

## 2024-11-12 MED ORDER — KETOROLAC TROMETHAMINE 30 MG/ML IJ SOLN
30.0000 mg | Freq: Once | INTRAMUSCULAR | Status: AC
  Administered 2024-11-12: 30 mg via INTRAVENOUS
  Filled 2024-11-12: qty 1

## 2024-11-12 MED ORDER — MORPHINE SULFATE (PF) 4 MG/ML IV SOLN
4.0000 mg | Freq: Once | INTRAVENOUS | Status: AC
  Administered 2024-11-12: 4 mg via INTRAVENOUS
  Filled 2024-11-12: qty 1

## 2024-11-12 MED ORDER — IOHEXOL 300 MG/ML  SOLN
100.0000 mL | Freq: Once | INTRAMUSCULAR | Status: AC | PRN
  Administered 2024-11-12: 100 mL via INTRAVENOUS

## 2024-11-12 MED ORDER — ONDANSETRON HCL 4 MG/2ML IJ SOLN
4.0000 mg | Freq: Once | INTRAMUSCULAR | Status: AC
  Administered 2024-11-12: 4 mg via INTRAVENOUS
  Filled 2024-11-12: qty 2

## 2024-11-12 MED ORDER — SODIUM CHLORIDE 0.9 % IV BOLUS
1000.0000 mL | Freq: Once | INTRAVENOUS | Status: AC
  Administered 2024-11-12: 1000 mL via INTRAVENOUS

## 2024-11-12 NOTE — ED Provider Notes (Signed)
 " [place] EMERGENCY DEPARTMENT AT [facility] Provider Note   CSN: [id number] Arrival date & time: 11/11/24  8077     Patient presents with: Chest Pain and Emesis   Jeremiah Anderson is a 58 y.o. male.   Patient is a 58 year old male with past medical history of hypertension, ADHD, hyperlipidemia.  Patient presenting today with complaints of abdominal pain and vomiting.  Symptoms began Saturday evening while driving home from [place] .  He describes discomfort in his epigastric region radiating into his back.  He had tried taking Zofran  at home with little relief.  He denies any diarrhea or constipation.  No fevers or chills.  He denies to me as having any chest pain or shortness of breath and denies any prior cardiac history.       Prior to Admission medications  Medication Sig Start Date End Date Taking? Authorizing Provider  amLODipine  (NORVASC ) 10 MG tablet Take 1 tablet (10 mg total) by mouth daily. 11/01/24   Allwardt, [name], PA-C  amphetamine -dextroamphetamine  (ADDERALL  XR) 20 MG 24 hr capsule Take 1 capsule (20 mg total) by mouth in the morning and at bedtime. 11/08/24 12/08/24  Allwardt, [name], PA-C  Baclofen  5 MG TABS Take 1 tablet by mouth 3 (three) times daily. 08/07/24   [provider]  butalbital -acetaminophen -caffeine  (BAC, BUTALBITAL -ACETAMIN-CAFF,) 50-325-40 MG tablet Take 1 tablet by mouth every six hours as needed 09/04/24     butalbital -acetaminophen -caffeine  (BAC, BUTALBITAL -ACETAMIN-CAFF,) 50-325-40 MG tablet Take 1 tablet by mouth every 6 (six) hours as needed. 10/28/24     Cholecalciferol (VITAMIN D3) 25 MCG (1000 UT) CAPS Take by mouth daily.    [provider]  Coenzyme Q10 (COQ10) 100 MG CAPS Take 100 mg by mouth daily.    [provider]  cyanocobalamin 100 MCG tablet Take 100 mcg by mouth daily.    [provider]  fluorouracil (EFUDEX) 5 % cream Apply topically 2 (two) times daily.    [provider]  fluticasone  (FLONASE ) 50 MCG/ACT nasal spray Place 2 sprays into both nostrils daily. 11/01/24   Allwardt, [name], PA-C  Lidocaine  4 % PTCH Place 1-2 patches onto the skin 2 (two) times daily as needed (for pain.). Salonpas Maximum Strength Pain Relieving Gel-Patch    [provider]  meloxicam  (MOBIC ) 15 MG tablet Take 1 tablet (15 mg total) by mouth daily as needed. 10/03/23     morphine  (MSIR) 30 MG tablet Take 30 mg by mouth every 4 (four) hours as needed. 09/15/21   [provider]  Multiple Vitamin (MULTIVITAMIN WITH MINERALS) TABS tablet Take 1 tablet by mouth daily.    [provider]  naloxone  (NARCAN ) nasal spray 4 mg/0.1 mL Place into the nose. Patient not taking: Reported on 09/18/2024 03/21/23   [provider]  ondansetron  (ZOFRAN ) 4 MG tablet TAKE 1 TABLET BY MOUTH EVERY 8 HOURS AS NEEDED FOR NAUSEA AND VOMITING 09/09/24   Allwardt, [name], PA-C  rosuvastatin  (CRESTOR ) 20 MG tablet Take 1 tablet (20 mg total) by mouth daily. 09/09/24   Allwardt, [name], PA-C  SUMAtriptan  (IMITREX ) 100 MG tablet MAY REPEAT IN 2 HOURS IF HEADACHE PERSISTS OR RECURS. 08/09/24   Allwardt, [name], PA-C  tazarotene (TAZORAC) 0.1 % gel APPLY A SMALL AMOUNT TO AFFECTED AREA AT BEDTIME 09/20/19   [provider]  testosterone  cypionate (DEPOTESTOSTERONE CYPIONATE) 200 MG/ML injection Inject 0.5 mLs (100 mg total) into the muscle once a week. 05/31/23  Allergies: Lisinopril, Fentanyl , Metoprolol , Sulfa antibiotics, and Pregabalin    Review of Systems  All other systems reviewed and are negative.   Updated Vital Signs BP (!) 139/101   Pulse 78   Temp 98.7 F (37.1 C) (Oral)   Resp 16   SpO2 100%   Physical Exam Vitals and nursing note reviewed.  Constitutional:      General: He is not in acute distress.    Appearance: He is well-developed. He is not diaphoretic.  HENT:     Head: Normocephalic and atraumatic.   Cardiovascular:     Rate and Rhythm: Normal rate and regular rhythm.     Heart sounds: No murmur heard.    No friction rub.  Pulmonary:     Effort: Pulmonary effort is normal. No respiratory distress.     Breath sounds: Normal breath sounds. No wheezing or rales.  Abdominal:     General: Bowel sounds are normal. There is no distension.     Palpations: Abdomen is soft.     Tenderness: There is abdominal tenderness. There is no guarding or rebound.     Comments: There is mild tenderness in the epigastric region.  Musculoskeletal:        General: Normal range of motion.     Cervical back: Normal range of motion and neck supple.  Skin:    General: Skin is warm and dry.  Neurological:     Mental Status: He is alert and oriented to person, place, and time.     Coordination: Coordination normal.     (all labs ordered are listed, but only abnormal results are displayed) Labs Reviewed  COMPREHENSIVE METABOLIC PANEL WITH GFR - Abnormal; Notable for the following components:      Result Value   BUN 24 (*)    All other components within normal limits  CBC - Abnormal; Notable for the following components:   WBC 10.9 (*)    All other components within normal limits  URINALYSIS, ROUTINE W REFLEX MICROSCOPIC - Abnormal; Notable for the following components:   Specific Gravity, Urine 1.042 (*)    Bilirubin Urine SMALL (*)    Ketones, ur 15 (*)    Protein, ur 30 (*)    Crystals PRESENT (*)    All other components within normal limits  TROPONIN T, HIGH SENSITIVITY - Abnormal; Notable for the following components:   Troponin T High Sensitivity 33 (*)    All other components within normal limits  TROPONIN T, HIGH SENSITIVITY - Abnormal; Notable for the following components:   Troponin T High Sensitivity 28 (*)    All other components within normal limits  LIPASE, BLOOD    EKG: EKG Interpretation Date/Time:  Monday November 11 2024 20:24:07 EST Ventricular Rate:  88 PR  Interval:  160 QRS Duration:  96 QT Interval:  370 QTC Calculation: 447 R Axis:   119  Text Interpretation: Normal sinus rhythm Normal ECG When compared with ECG of 18-Oct-2016 14:18,  no significant change is noted Confirmed by Geroldine Berg ([id number]) on 11/11/2024 11:02:36 PM  Radiology: [facility] Chest 2 View Result Date: 11/11/2024 CLINICAL DATA:  Chest pain EXAM: CHEST - 2 VIEW COMPARISON:  None Available. FINDINGS: The heart size and mediastinal contours are within normal limits. Both lungs are clear. Hardware in the cervical and lumbar spine. Left shoulder replacement IMPRESSION: No active cardiopulmonary disease. Electronically Signed   By: Luke Bun M.D.   On: 11/11/2024 20:58     Procedures   Medications  Ordered in the ED  sodium chloride  0.9 % bolus 1,000 mL (has no administration in time range)  ondansetron  (ZOFRAN ) injection 4 mg (has no administration in time range)  ketorolac  (TORADOL ) 30 MG/ML injection 30 mg (has no administration in time range)                                    Medical Decision Making Amount and/or Complexity of Data Reviewed Labs: ordered. Radiology: ordered.  Risk Prescription drug management.   Patient is a 58 year old male presenting with epigastric discomfort.  This started after eating chicken and fries from [employer] while driving home from [place]  Saturday night.  He has been having nausea and vomiting since.  He describes some tightness in the epigastric region that radiates into his back.  He arrives here with stable vital signs and is afebrile.  Physical examination reveals mild tenderness in the epigastric region, but no peritoneal signs.  Laboratory studies obtained including CBC and CMP, both of which are unremarkable.  Initial troponin was 33, then repeated at 28, then checked a third time and was 26.  Urinalysis is clear.  CT scan of the abdomen and pelvis obtained showing no acute intra-abdominal process.  There was the  mention of an inguinal testis, however patient states that his testicles are in the proper position so no further workup will be initiated into this.  Chest x-ray unremarkable.  Patient has been observed for many hours and symptoms have not significantly changed.  I have considered a cardiac etiology given his mild troponin elevation, but his symptoms do not sound cardiac.  I have discussed with Dr. Napper from cardiology who recommended the third troponin and further observation.  Since this troponin is negative and trending downward, I feel as though he can safely be discharged.  I will have him follow-up with his cardiologist and he understands to return in the meantime if symptoms worsen or change.  Because of the troponin elevation unclear, but doubt ACS.     Final diagnoses:  None    ED Discharge Orders     None          Geroldine Berg, MD 11/12/24 [phone]  "

## 2024-11-12 NOTE — Telephone Encounter (Signed)
 Cardiologist on-call engaged to discuss atypical angina.  Briefly this is a 58 year old male with a history notable for hypertension, hyperlipidemia-who presented to the emergency department after development of abdominal discomfort with nausea after eating a meal-where he subjectively felt unsettled after the meal.  On arrival to the ED, he was noted febrile, hemodynamically stable-troponin trend flat 33, 28, 26.  EKG without ischemic changes.  Discussed that this presentation could be atypical for an acute coronary syndrome-clinically more likely to be GI related given direct correlation with meal.  No chest discomfort/pain.  Reasonable to give strict return precautions and close outpatient follow-up to ensure full return to baseline.  Jeremiah Kapur, MD Division of Cardiology

## 2024-11-12 NOTE — Telephone Encounter (Signed)
 Transition Care Management Unsuccessful Follow-up Telephone Call  Date of discharge and from where:  11/11/24; [facility]  Attempts:  1st Attempt  Reason for unsuccessful TCM follow-up call:  Left voice message; LVM for patient to complete TOC call and schedule for ED follow up with PCP. Advised to call our office to schedule this appointment. If pt returns call please schedule pt accordingly.

## 2024-11-12 NOTE — Discharge Instructions (Signed)
 Follow-up with your cardiologist in the morning and make arrangements for prompt follow-up.  Return to the emergency department in the meantime if you develop chest pains, difficulty breathing, or for other new and concerning symptoms.

## 2024-11-13 ENCOUNTER — Telehealth: Payer: Self-pay

## 2024-11-13 NOTE — Telephone Encounter (Signed)
 Copied from CRM #[id number]. Topic: Referral - Question >> Nov 12, 2024  4:42 PM Jayma L wrote: Asking for a gasto dr as lesa

## 2024-11-13 NOTE — Telephone Encounter (Signed)
 Noted

## 2024-11-13 NOTE — Telephone Encounter (Signed)
 CRM from patient call regarding seeing Cardiology and Gastroenterology; MyChart message sent to patient to get more information and clarification.

## 2024-11-19 ENCOUNTER — Ambulatory Visit: Admitting: Physician Assistant

## 2024-11-19 ENCOUNTER — Encounter: Payer: Self-pay | Admitting: Physician Assistant

## 2024-11-19 VITALS — BP 136/82 | HR 94 | Temp 98.1°F | Ht 69.5 in | Wt 180.0 lb

## 2024-11-19 DIAGNOSIS — R1013 Epigastric pain: Secondary | ICD-10-CM | POA: Diagnosis not present

## 2024-11-19 DIAGNOSIS — R112 Nausea with vomiting, unspecified: Secondary | ICD-10-CM | POA: Diagnosis not present

## 2024-11-19 DIAGNOSIS — R7989 Other specified abnormal findings of blood chemistry: Secondary | ICD-10-CM | POA: Diagnosis not present

## 2024-11-19 DIAGNOSIS — F902 Attention-deficit hyperactivity disorder, combined type: Secondary | ICD-10-CM | POA: Diagnosis not present

## 2024-11-19 DIAGNOSIS — Q859 Phakomatosis, unspecified: Secondary | ICD-10-CM | POA: Insufficient documentation

## 2024-11-19 MED ORDER — SUCRALFATE 1 G PO TABS: 1.0000 g | ORAL_TABLET | Freq: Three times a day (TID) | ORAL | 0 refills | Status: AC

## 2024-11-19 NOTE — Telephone Encounter (Signed)
 Please see patient reply to our previous messages concerning Cardiology and Gastroenterology referrals being requested. Patient has appt today

## 2024-11-19 NOTE — Progress Notes (Signed)
 "   Patient ID: Jeremiah Anderson, male    DOB: [birth year], 58 y.o.   MRN: [id number]   Assessment & Plan:  Epigastric pain -     Sucralfate ; Take 1 tablet (1 g total) by mouth 4 (four) times daily -  with meals and at bedtime.  Dispense: 60 tablet; Refill: 0 -     Ambulatory referral to Gastroenterology  Elevated troponin -     Ambulatory referral to Cardiology  Nausea and vomiting, unspecified vomiting type -     Sucralfate ; Take 1 tablet (1 g total) by mouth 4 (four) times daily -  with meals and at bedtime.  Dispense: 60 tablet; Refill: 0 -     Ambulatory referral to Gastroenterology  Attention deficit hyperactivity disorder (ADHD), combined type     Assessment & Plan Chronic gastritis and duodenal ulcer Chronic gastritis and duodenal ulcer with recent exacerbation. Symptoms include epigastric pain radiating to the back, nausea, and vomiting. Previous endoscopy in 2016 showed chronic gastritis and three non-bleeding ulcers in the duodenum. Current symptoms suggest reactivation of ulcers, possibly exacerbated by NSAID use (meloxicam ). No evidence of H. pylori infection. Differential includes NSAID-induced gastritis and ulcer reactivation. - Prescribed Carafate  tablets four times a day to coat and calm the stomach lining. - Continue alternating Pepcid and Tagamet to reduce acid production. - Advised a soft, gentle diet to minimize gastric irritation. - Referred to gastroenterology for further evaluation and possible endoscopy.  Evaluation of elevated troponin Elevated troponin levels noted during recent ER visit, initially at 33, trending down to 26. Cardiology consultation suggested non-cardiac cause, possibly stress-related. No evidence of acute coronary syndrome. EKG and cardiac evaluation were normal. Surgery is on hold until cardiology clearance due to elevated troponin. - Referred to cardiology for further evaluation and clearance for surgery. - Advised to contact cardiology clinic  to expedite evaluation for surgical clearance.  Attention-deficit hyperactivity disorder, combined type ADHD, combined type, currently managed with Adderall . - Continue Adderall  as prescribed for three months. PDMP reviewed today, no red flags, filling appropriately.       Return in about 3 months (around 02/17/2025) for recheck/follow-up.    Subjective:    Chief Complaint  Patient presents with   Follow-up    Pt in office for ED follow up and visit for medication refills; pt requesting referrals to gastroenterology and Cardiology in regards to recent ED visit and was advised to follow up with Cardiology but pt thinks its Gastro related as well. Pt has been c/o abdominal pain and at times hurts even in the back as well. When getting seen at ED troponin levels elevated.     HPI Discussed the use of AI scribe software for clinical note transcription with the patient, who gave verbal consent to proceed.  History of Present Illness Jeremiah Anderson is a 58 year old male with chronic gastritis and non-bleeding ulcers who presents with abdominal pain and vomiting.  He experienced abdominal pain and vomiting that began while driving home from [place] . The pain was located in the epigastric region and radiated to his back. No diarrhea or constipation. In the emergency department, his troponin levels were initially elevated at 33, then decreased to 28 and 26 upon subsequent checks. A CT scan of the abdomen showed no acute intraabdominal process. He received Zofran , Toradol , and IV fluids during his emergency department visit.  The pain persists under his sternum, especially when standing up, and he finds relief when lying down with his knees up.  He recalls a similar episode in 2016 when an endoscopy revealed esophagitis and three non-bleeding ulcers. He was taking NSAIDs at that time, which he believes contributed to the irritation. Currently, he takes Tagamet and Pepcid alternately every  six hours, which provides some relief. The pain worsens after eating but not after drinking. He is concerned about the potential role of meloxicam , which he had stopped taking for nine or ten days before the recent episode.  He has a history of chronic gastritis and non-bleeding ulcers in the duodenum. No blood in his stool or vomit. He experiences acid reflux, with green or yellow bile when vomiting. He is currently taking Zofran  for nausea. He is also on Adderall , which he has filled and is taking as prescribed.     Past Medical History:  Diagnosis Date   Allergy    Arthritis    Blood transfusion without reported diagnosis    around age 30 after major nausea secondary to prescribed MS Contin    GERD (gastroesophageal reflux disease)    Hyperlipidemia    Hypertension    Migraines    Syncope and collapse 11/22   I got some head rushes   Ulcer 11/21/2008   Not actually an ulcer but stomach irritation to the point could not hold down food or take medication until treated. I take medication for Thalia and have not had the problem since   Wears glasses     Past Surgical History:  Procedure Laterality Date   APPENDECTOMY  1980s   post op bleeding sent him back for ex lap and hemostatic intervention.    BACK SURGERY     8 surgeries total - fused from L1-S1   BRAIN SURGERY     COLONOSCOPY     Age 37. For bleeding   ESOPHAGOGASTRODUODENOSCOPY N/A 06/10/2015   Procedure: ESOPHAGOGASTRODUODENOSCOPY (EGD);  Surgeon: Princella CHRISTELLA Nida, MD;  Location: [facility] ENDOSCOPY;  Service: Endoscopy;  Laterality: N/A;   ex lap with peri gastric vessel ligation  1990s   after retching, he rupture a blood vessel outside the stomach.  vessel was ligated per pt's descriiption.    EYE SURGERY  2018   Laser surgery to fix retina tears   HERNIA REPAIR  2019   Flank hernia right side from where surgeon cut open my side to do the back fusion surgery and it never healed correctly   INCISIONAL HERNIA REPAIR Right  10/21/2016   Procedure: OPEN [facility] HERNIA REPAIR WITH MESH;  Surgeon: Lynda Leos, MD;  Location: [facility] OR;  Service: General;  Laterality: Right;   INSERTION OF MESH Right 10/21/2016   Procedure: INSERTION OF MESH;  Surgeon: Lynda Leos, MD;  Location: [facility] OR;  Service: General;  Laterality: Right;   JOINT REPLACEMENT     KNEE SURGERY     Multiple bilateral scopes   RETINAL DETACHMENT SURGERY Right    SHOULDER SURGERY     4x on the left - total shoulder replacement.    SHOULDER SURGERY Left    x2   SPINE SURGERY  First 1990   9 surgeries total, I am fused from S1-L1 and now have flat back syndrome   TOTAL SHOULDER ARTHROPLASTY Left    x2    Family History  Problem Relation Age of Onset   Arthritis Mother    Hyperlipidemia Mother    Hypertension Father    Cancer Father    Skin cancer Father    Colon cancer Maternal Grandmother    Cancer Maternal Grandmother  Hypertension Sister    Hypertension Paternal Grandmother    Heart attack Paternal Grandfather    Heart disease Paternal Grandfather    Hypertension Paternal Grandfather    Diabetes Neg Hx    Stroke Neg Hx    Prostate cancer Neg Hx     Social History[1]   Allergies[2]  Review of Systems NEGATIVE UNLESS OTHERWISE INDICATED IN HPI      Objective:     BP 136/82 (BP Location: Left Arm, Patient Position: Sitting, Cuff Size: Normal)   Pulse 94   Temp 98.1 F (36.7 C) (Temporal)   Ht 5' 9.5 (1.765 m)   Wt 180 lb (81.6 kg)   SpO2 99%   BMI 26.20 kg/m   Wt Readings from Last 3 Encounters:  11/19/24 180 lb (81.6 kg)  09/18/24 181 lb (82.1 kg)  09/16/24 181 lb 3.2 oz (82.2 kg)    BP Readings from Last 3 Encounters:  11/19/24 136/82  11/12/24 134/81  09/16/24 136/82     Physical Exam Vitals and nursing note reviewed.  Constitutional:      Appearance: He is well-developed.  Eyes:     Extraocular Movements: Extraocular movements intact.     Conjunctiva/sclera: Conjunctivae normal.     Pupils:  Pupils are equal, round, and reactive to light.  Cardiovascular:     Rate and Rhythm: Normal rate and regular rhythm.     Heart sounds: Normal heart sounds.  Pulmonary:     Effort: Pulmonary effort is normal.     Breath sounds: Normal breath sounds.  Abdominal:     General: There is no distension.     Palpations: There is no mass.     Tenderness: There is abdominal tenderness (epigastric). There is no right CVA tenderness, left CVA tenderness, guarding or rebound.     Hernia: No hernia is present.  Musculoskeletal:     Right lower leg: No edema.     Left lower leg: No edema.  Neurological:     Mental Status: He is alert.  Psychiatric:        Mood and Affect: Mood normal.             Red Mandt M Loring Liskey, PA-C     [1]  Social History Tobacco Use   Smoking status: Former    Types: Cigarettes   Smokeless tobacco: Never  Vaping Use   Vaping status: Never Used  Substance Use Topics   Alcohol use: Not Currently   Drug use: No  [2]  Allergies Allergen Reactions   Lisinopril Swelling and Anaphylaxis   Fentanyl  Other (See Comments)    Hallucinations (with the patch)  Other Reaction(s): Other (See Comments)  Hears voice   Fentanyl  Patch   Metoprolol  Other (See Comments)   Sulfa Antibiotics Swelling   Pregabalin Other (See Comments)    hallucinations  pregabalin   "

## 2024-11-22 ENCOUNTER — Encounter: Payer: Self-pay | Admitting: Physician Assistant

## 2024-11-22 ENCOUNTER — Encounter: Payer: Self-pay | Admitting: Gastroenterology

## 2024-11-23 ENCOUNTER — Encounter: Payer: Self-pay | Admitting: Cardiology

## 2024-11-23 NOTE — Progress Notes (Unsigned)
 "    Cardiology Office Note   Date:  11/25/2024   ID:  Jeremiah Anderson, DOB 14-Feb-1966, MRN [id number]  PCP:  Kathrene Mardy HERO, PA-C  Cardiologist:   Shelda Bruckner, MD Referring:  Allwardt, Mardy HERO, PA-C  Chief Complaint  Patient presents with   Chest Pain      History of Present Illness: Jeremiah Anderson is a 59 y.o. male who presents for Jeremiah Anderson is a 59 y.o. male with a hx of hypertension, hyperlipidemia, GERD, arthritis, and chronic opioid use who is referred for evaluation of chest pain.    He was seen in the ED in the distant past for evaluation of chest pain.  This was not thought to be anginal.  He was back in the emergency room in December of this year with some epigastric discomfort.  He said he was traveling from [facility]  He went through a drive-through at [employer] and he had some food and later on got pain under his mid sternum that radiated around his right side through to his back.  He had nausea and vomiting.  He went to the emergency room and I did review these records.  This was thought to be nonanginal.  However, there was a very minimally elevated troponin with readings of 33, 28, 26.  There were no acute EKG changes.  His symptoms resolved.  He has not had any further episodes of epigastric discomfort.  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  He denies any chest pressure, neck or arm discomfort.  He has no new shortness of breath, PND or orthopnea.  He had no weight gain or edema.  He has not had any prior cardiac history.   Past Medical History:  Diagnosis Date   Allergy    Arthritis    Blood transfusion without reported diagnosis    around age 38 after major nausea secondary to prescribed MS Contin    GERD (gastroesophageal reflux disease)    Hyperlipidemia    Hypertension    Migraines    Ulcer 11/21/2008   Not actually an ulcer but stomach irritation to the point could not hold down food or take medication until treated. I take medication for  Thalia and have not had the problem since   Wears glasses     Past Surgical History:  Procedure Laterality Date   APPENDECTOMY  1980s   post op bleeding sent him back for ex lap and hemostatic intervention.    BACK SURGERY     8 surgeries total - fused from L1-S1   BRAIN SURGERY     COLONOSCOPY     Age 53. For bleeding   ESOPHAGOGASTRODUODENOSCOPY N/A 06/10/2015   Procedure: ESOPHAGOGASTRODUODENOSCOPY (EGD);  Surgeon: Princella HERO Nida, MD;  Location: [facility] ENDOSCOPY;  Service: Endoscopy;  Laterality: N/A;   ex lap with peri gastric vessel ligation  1990s   after retching, he rupture a blood vessel outside the stomach.  vessel was ligated per pt's descriiption.    EYE SURGERY  2018   Laser surgery to fix retina tears   HERNIA REPAIR  2019   Flank hernia right side from where surgeon cut open my side to do the back fusion surgery and it never healed correctly   INCISIONAL HERNIA REPAIR Right 10/21/2016   Procedure: OPEN [facility] HERNIA REPAIR WITH MESH;  Surgeon: Lynda Leos, MD;  Location: [facility] OR;  Service: General;  Laterality: Right;   INSERTION OF MESH Right 10/21/2016   Procedure: INSERTION  OF MESH;  Surgeon: Lynda Leos, MD;  Location: [facility] OR;  Service: General;  Laterality: Right;   JOINT REPLACEMENT     KNEE SURGERY     Multiple bilateral scopes   RETINAL DETACHMENT SURGERY Right    SHOULDER SURGERY     4x on the left - total shoulder replacement.    SHOULDER SURGERY Left    x2   SPINE SURGERY  First 1990   9 surgeries total, I am fused from S1-L1 and now have flat back syndrome   TOTAL SHOULDER ARTHROPLASTY Left    x2     Current Outpatient Medications  Medication Sig Dispense Refill   amLODipine  (NORVASC ) 10 MG tablet Take 1 tablet (10 mg total) by mouth daily. 90 tablet 1   amphetamine -dextroamphetamine  (ADDERALL  XR) 20 MG 24 hr capsule Take 1 capsule (20 mg total) by mouth in the morning and at bedtime. 60 capsule 0   Baclofen  5 MG TABS Take 1 tablet by mouth 3  (three) times daily. (Patient taking differently: Take 1 tablet by mouth 3 (three) times daily. PRN only)     butalbital -acetaminophen -caffeine  (BAC, BUTALBITAL -ACETAMIN-CAFF,) 50-325-40 MG tablet Take 1 tablet by mouth every 6 (six) hours as needed. 120 tablet 1   Cholecalciferol (VITAMIN D3) 25 MCG (1000 UT) CAPS Take by mouth daily.     Coenzyme Q10 (COQ10) 100 MG CAPS Take 100 mg by mouth daily.     cyanocobalamin 100 MCG tablet Take 100 mcg by mouth daily.     fluorouracil (EFUDEX) 5 % cream Apply topically 2 (two) times daily. (Patient taking differently: Apply topically 2 (two) times daily. PRN only)     fluticasone  (FLONASE ) 50 MCG/ACT nasal spray Place 2 sprays into both nostrils daily. (Patient taking differently: Place 2 sprays into both nostrils daily. PRN only) 16 g 11   Lidocaine  4 % PTCH Place 1-2 patches onto the skin 2 (two) times daily as needed (for pain.). Salonpas Maximum Strength Pain Relieving Gel-Patch     meloxicam  (MOBIC ) 15 MG tablet Take 1 tablet (15 mg total) by mouth daily as needed. 30 tablet 2   morphine  (MSIR) 30 MG tablet Take 30 mg by mouth every 4 (four) hours as needed.     Multiple Vitamin (MULTIVITAMIN WITH MINERALS) TABS tablet Take 1 tablet by mouth daily.     naloxone  (NARCAN ) nasal spray 4 mg/0.1 mL Place into the nose. (Patient taking differently: Place into the nose. PRN only)     ondansetron  (ZOFRAN ) 4 MG tablet TAKE 1 TABLET BY MOUTH EVERY 8 HOURS AS NEEDED FOR NAUSEA AND VOMITING 30 tablet 3   rosuvastatin  (CRESTOR ) 20 MG tablet Take 1 tablet (20 mg total) by mouth daily. 90 tablet 1   SUMAtriptan  (IMITREX ) 100 MG tablet MAY REPEAT IN 2 HOURS IF HEADACHE PERSISTS OR RECURS. 9 tablet 3   tazarotene (TAZORAC) 0.1 % gel APPLY A SMALL AMOUNT TO AFFECTED AREA AT BEDTIME     testosterone  cypionate (DEPOTESTOSTERONE CYPIONATE) 200 MG/ML injection Inject 0.5 mLs (100 mg total) into the muscle once a week. 10 mL 1   butalbital -acetaminophen -caffeine  (BAC,  BUTALBITAL -ACETAMIN-CAFF,) 50-325-40 MG tablet Take 1 tablet by mouth every six hours as needed (Patient not taking: Reported on 11/25/2024) 120 tablet 1   sucralfate  (CARAFATE ) 1 g tablet Take 1 tablet (1 g total) by mouth 4 (four) times daily -  with meals and at bedtime. (Patient not taking: Reported on 11/25/2024) 60 tablet 0   No current facility-administered medications for this  visit.    Allergies:   Lisinopril, Fentanyl , Metoprolol , Sulfa antibiotics, and Pregabalin    Social History:  The patient  reports that he has quit smoking. His smoking use included cigarettes. He has never used smokeless tobacco. He reports that he does not currently use alcohol. He reports that he does not use drugs.   Family History:  The patient's family history includes Arthritis in his mother; Cancer in his father and maternal grandmother; Colon cancer in his maternal grandmother; Heart attack in his paternal grandfather; Heart disease in his paternal grandfather; Hyperlipidemia in his mother; Hypertension in his father, paternal grandfather, paternal grandmother, and sister; Skin cancer in his father.    ROS:  Please see the history of present illness.   Otherwise, review of systems are positive for none.   All other systems are reviewed and negative.    PHYSICAL EXAM: VS:  BP (!) 142/84   Pulse 86   Ht 5' 9 (1.753 m)   Wt 177 lb (80.3 kg)   SpO2 98%   BMI 26.14 kg/m  , BMI Body mass index is 26.14 kg/m. GENERAL:  Well appearing HEENT:  Pupils equal round and reactive, fundi not visualized, oral mucosa unremarkable NECK:  No jugular venous distention, waveform within normal limits, carotid upstroke brisk and symmetric, no bruits, no thyromegaly LYMPHATICS:  No cervical, inguinal adenopathy LUNGS:  Clear to auscultation bilaterally BACK:  No CVA tenderness CHEST:  Unremarkable HEART:  PMI not displaced or sustained,S1 and S2 within normal limits, no S3, no S4, no clicks, no rubs, no murmurs ABD:   Flat, positive bowel sounds normal in frequency in pitch, no bruits, no rebound, no guarding, no midline pulsatile mass, no hepatomegaly, no splenomegaly EXT:  2 plus pulses throughout, no edema, no cyanosis no clubbing SKIN:  No rashes no nodules NEURO:  Cranial nerves II through XII grossly intact, motor grossly intact throughout [facility]:  Cognitively intact, oriented to person place and time  EKG:  EKG Interpretation Date/Time:  Monday November 25 2024 10:58:33 EST Ventricular Rate:  86 PR Interval:  132 QRS Duration:  88 QT Interval:  376 QTC Calculation: 449 R Axis:   -44  Text Interpretation: Normal sinus rhythm Left axis deviation When compared with ECG of 11-Nov-2024 20:24, Left posterior fascicular block is no longer Present Confirmed by Lavona Rattan ([id number]) on 11/25/2024 11:00:48 AM   Recent Labs: 08/15/2024: TSH 1.90 11/11/2024: ALT 40; BUN 24; Creatinine, Ser 1.11; Hemoglobin 14.7; Platelets 273; Potassium 4.0; Sodium 137    Lipid Panel    Component Value Date/Time   CHOL 198 08/15/2024 1105   TRIG 331.0 (H) 08/15/2024 1105   HDL 68.90 08/15/2024 1105   CHOLHDL 3 08/15/2024 1105   VLDL 66.2 (H) 08/15/2024 1105   LDLCALC 63 08/15/2024 1105   LDLCALC 82 11/04/2020 1349   LDLDIRECT 180.0 02/23/2017 1236      Wt Readings from Last 3 Encounters:  11/25/24 177 lb (80.3 kg)  11/19/24 180 lb (81.6 kg)  09/18/24 181 lb (82.1 kg)      Other studies Reviewed: Additional studies/ records that were reviewed today include:  ED records. Review of the above records demonstrates:  Please see elsewhere in the note.     ASSESSMENT AND PLAN:    Epigastric discomfort: I think this is atypical.  He is not having it anymore.  I reviewed the emergency room records.  He has no significant ongoing cardiovascular risk factors.  He does have some hypertriglyceridemia and we  talked about this.  I do not think further cardiac testing is indicated.  Sleep apnea:   This was mild on home  sleep testing in 2022.  I noted this incidentally and he is not having any excessive somnolence or other symptoms.  No change in therapy.  Hypertriglyceridemia: We discussed management of this and I would not suggest a change in therapy but he should have a fasting lipid profile at some point with dietary modification of carbohydrates.  Hypertension: His blood pressure is minimally elevated and he might need HCTZ.  I have suggested follow-up blood pressure diary and follow-up with his primary.  Preop: The patient has no high risk findings.  He has no high risk symptoms.  He has a high functional level.  He is not going for high risk surgical procedure.  No further cardiovascular testing is suggested.  According to ACC/AHA guidelines he is at acceptable risk for the planned procedure.    Current medicines are reviewed at length with the patient today.  The patient does not have concerns regarding medicines.  The following changes have been made:  no change  Labs/ tests ordered today include:   Orders Placed This Encounter  Procedures   EKG 12-Lead     Disposition:   FU with me as needed.     Signed, Lynwood Schilling, MD  11/25/2024 11:40 AM    [facility]    "

## 2024-11-25 ENCOUNTER — Other Ambulatory Visit: Payer: Self-pay

## 2024-11-25 ENCOUNTER — Ambulatory Visit: Attending: Cardiology | Admitting: Cardiology

## 2024-11-25 ENCOUNTER — Encounter: Payer: Self-pay | Admitting: Cardiology

## 2024-11-25 VITALS — BP 142/84 | HR 86 | Ht 69.0 in | Wt 177.0 lb

## 2024-11-25 DIAGNOSIS — M25552 Pain in left hip: Secondary | ICD-10-CM

## 2024-11-25 DIAGNOSIS — I10 Essential (primary) hypertension: Secondary | ICD-10-CM

## 2024-11-25 NOTE — Patient Instructions (Signed)

## 2024-11-29 ENCOUNTER — Other Ambulatory Visit (HOSPITAL_BASED_OUTPATIENT_CLINIC_OR_DEPARTMENT_OTHER): Payer: Self-pay

## 2024-12-06 ENCOUNTER — Other Ambulatory Visit: Payer: Self-pay

## 2024-12-06 ENCOUNTER — Other Ambulatory Visit: Payer: Self-pay | Admitting: Physician Assistant

## 2024-12-06 ENCOUNTER — Encounter: Payer: Self-pay | Admitting: Physician Assistant

## 2024-12-06 DIAGNOSIS — F902 Attention-deficit hyperactivity disorder, combined type: Secondary | ICD-10-CM

## 2024-12-06 MED ORDER — AMPHETAMINE-DEXTROAMPHET ER 20 MG PO CP24: 20.0000 mg | ORAL_CAPSULE | Freq: Two times a day (BID) | ORAL | 0 refills | Status: AC

## 2024-12-06 MED ORDER — AMPHETAMINE-DEXTROAMPHET ER 20 MG PO CP24: 20.0000 mg | ORAL_CAPSULE | Freq: Two times a day (BID) | ORAL | 0 refills | Status: DC

## 2024-12-06 NOTE — Telephone Encounter (Signed)
 Last OV: 11/19/24  Next OV: 02/12/25  Last filled: 11/08/24  Quantity: 60   Please send 3 month supply to pharmacy to keep on file for refills

## 2024-12-09 ENCOUNTER — Other Ambulatory Visit: Payer: Self-pay | Admitting: Physician Assistant

## 2024-12-12 ENCOUNTER — Other Ambulatory Visit: Payer: Self-pay | Admitting: Physician Assistant

## 2024-12-16 ENCOUNTER — Encounter: Payer: Self-pay | Admitting: Cardiology

## 2024-12-18 ENCOUNTER — Other Ambulatory Visit: Payer: Self-pay

## 2024-12-18 ENCOUNTER — Encounter: Payer: Self-pay | Admitting: Family Medicine

## 2024-12-18 ENCOUNTER — Ambulatory Visit: Admitting: Family Medicine

## 2024-12-18 VITALS — BP 142/70 | HR 103 | Ht 69.0 in | Wt 176.0 lb

## 2024-12-18 DIAGNOSIS — E782 Mixed hyperlipidemia: Secondary | ICD-10-CM

## 2024-12-18 DIAGNOSIS — M25552 Pain in left hip: Secondary | ICD-10-CM

## 2024-12-18 DIAGNOSIS — I10 Essential (primary) hypertension: Secondary | ICD-10-CM

## 2024-12-18 NOTE — Progress Notes (Signed)
"       ° °  I, Leotis Batter, CMA acting as a scribe for Artist Lloyd, MD.  Jeremiah Anderson is a 59 y.o. male who presents to [facility] today for L hip pain. Pt was previously seen by Dr. Lloyd on 02/21/24 for R thumb pain.  Today, pt c/o L hip pain x several years, worsening more recently. Thinks sx are related to abnormal posture d/t hx of multiple back surgeries. Pt locates pain to anterior aspect of both hips primarily, occasionally will have pain at lateral aspect of the left hip. Current on oral steroid.   Radiates: denies radicular sx Aggravates: standing erect  Treatments tried: dry needling  Dx testing: 07/11/23 Bilat hips/pelvis & L-spine XR  Pertinent review of systems: No fevers or chills  Relevant historical information: Extensive lumbar fusion.   Exam:  BP (!) 142/70   Pulse (!) 103   Ht 5' 9 (1.753 m)   Wt 176 lb (79.8 kg)   SpO2 97%   BMI 25.99 kg/m  General: Well Developed, well nourished, and in no acute distress.   MSK: Left hip normal-appearing Decreased range of motion.  Limited flexion and internal rotation.  Strength is intact.    Lab and Radiology Results  CT scan images bilateral hips obtained on CT scan abdomen and pelvis December 2025 personally and independently interpreted. Femoral acetabular impingement pattern present bilaterally with degenerative changes rated mild on left.  No acute fractures are visible.    Assessment and Plan: 59 y.o. male with left anterior hip pain due to hip impingement and some degenerative changes.  We discussed options.  Plan to refer to physical therapy.  Recheck in 6 weeks.  If not improved consider trial of injection.   PDMP not reviewed this encounter. Orders Placed This Encounter  Procedures   Ambulatory referral to Physical Therapy    Referral Priority:   Routine    Referral Type:   Physical Medicine    Referral Reason:   Specialty Services Required    Requested Specialty:    Physical Therapy    Number of Visits Requested:   1   No orders of the defined types were placed in this encounter.    Discussed warning signs or symptoms. Please see discharge instructions. Patient expresses understanding.   The above documentation has been reviewed and is accurate and complete Artist Lloyd, M.D.   "

## 2024-12-18 NOTE — Patient Instructions (Addendum)
 Thank you for coming in today.   A referral for physical therapy has been submitted. A representative from the physical therapy office will contact you to coordinate scheduling after confirming your benefits with your insurance provider. If you do not hear from the physical therapy office within the next 1-2 weeks, please let us  know.   If your symptoms do not improve, we can consider doing an injection.   See you back in 6 weeks

## 2024-12-25 ENCOUNTER — Other Ambulatory Visit (HOSPITAL_BASED_OUTPATIENT_CLINIC_OR_DEPARTMENT_OTHER): Payer: Self-pay

## 2024-12-25 MED ORDER — BUTALBITAL-APAP-CAFFEINE 50-325-40 MG PO TABS: 1.0000 | ORAL_TABLET | Freq: Four times a day (QID) | ORAL | 1 refills | Status: AC | PRN

## 2024-12-26 ENCOUNTER — Ambulatory Visit: Admitting: Gastroenterology

## 2024-12-26 ENCOUNTER — Encounter: Payer: Self-pay | Admitting: Gastroenterology

## 2024-12-26 VITALS — BP 130/82 | HR 87 | Ht 69.0 in | Wt 178.1 lb

## 2024-12-26 DIAGNOSIS — R112 Nausea with vomiting, unspecified: Secondary | ICD-10-CM | POA: Diagnosis not present

## 2024-12-26 DIAGNOSIS — Z8711 Personal history of peptic ulcer disease: Secondary | ICD-10-CM

## 2024-12-26 DIAGNOSIS — R1013 Epigastric pain: Secondary | ICD-10-CM | POA: Diagnosis not present

## 2024-12-26 NOTE — Progress Notes (Signed)
 "     [facility] Consult Note:  History: Jeremiah Anderson 12/26/2024  Referring provider: Allwardt, Jeremiah HERO, PA-C  Reason for consult/chief complaint: Emesis (Patient presents today for constant vomiting. ) and Abdominal Pain (Patient states he experienced abdominal pain that radiated thru his back due to vomiting)   Subjective  Prior history:  From Dr. Legrand April 2018 office note: This is a 59 year old man previously seen by Dr. Obie in 2016. He has had many years of frequent vomiting that is of unclear cause. He has taken morphine  for many years due to chronic back pain requiring multiple surgeries. He is a very tangential historian, and it is difficult to follow his symptom pattern. There was a time when he would vomit 5 or 6 times a day, sometimes undigested food. He says he has had little or no vomiting in the last 2 weeks. He takes Zofran  as needed for control, and cannot recall any other meds they have taken in the past. He recalls having a workup and treatment for vomiting as far back as his 20s. His EGD with Dr. Obie in July 2016 showed esophagitis and shallow duodenal ulcers felt likely secondary to NSAIDs. H. pylori biopsy was negative.  No polyps screening colonoscopy May 2018 with Dr. Legrand  ED visit December 2025 for acute onset epigastric pain nausea and vomiting.  Elevated troponin that trended down, no ischemic EKG changes, reportedly not felt likely to be acute coronary syndrome.  Subsequent PCP note indicated patient had resumed using meloxicam . Saw cardiology 11/25/24 -symptoms were felt not to be cardiac in nature.  No further cardiac testing was planned and he was deemed appropriate to undergo endoscopic or surgical procedure (her right thumb surgery had been delayed after the ED visit).   Discussed the use of AI scribe software for clinical note transcription with the patient, who gave verbal consent to proceed.  History of Present Illness Jeremiah Anderson is a 59 year old male with chronic vomiting and gastroparesis who presents for evaluation of recurrent vomiting.  (Somewhat distracted and tangential today)  Recurrent Vomiting and Gastroparesis: - Chronic vomiting present for many years, with a significant episode prior to 2018 requiring hospitalization and endoscopy - Severe episode in August 2025 with persistent vomiting lasting all night and day, associated with new onset back pain - Another episode in late December 2025 with severe vomiting and back pain while traveling, leading to emergency department evaluation.  Occurred later in the evening after having had fast food while on a trip. - Troponin and EKGs in December 2025 were unremarkable - Vomiting and pain have significantly improved since December 2025 - No ongoing vomiting likely experienced in December/early January, but intermittent nausea and frequent belching persist - Increased use of Zofran  for nausea  Peptic Ulcer Disease and Chronic Gastritis: - History of NSAID-associated ulcers - Daily meloxicam  use for orthopedic pain resumed about a year ago; meloxicam  discontinued for 10-14 days prior to current visit but has since been resumed due to ongoing pain - Prescribed short course of sucralfate  after December 2025 ED visit, discontinued due to complexity of medication regimen. - Pepcid increased to twice daily for acid reduction due to persistent symptoms  Chronic Pain Syndrome and Opioid Use: - Chronic pain managed with regular opioid therapy - No constipation despite chronic opioid use - Recent thumb surgery performed on November 29, 2024 - Significant back pain associated with vomiting episodes in August and December 2025   ROS:  Review of Systems  Constitutional:  Negative for appetite change and unexpected weight change.  HENT:  Negative for mouth sores and voice change.   Eyes:  Negative for pain and redness.  Respiratory:  Negative for cough and shortness  of breath.   Cardiovascular:  Negative for chest pain and palpitations.  Genitourinary:  Negative for dysuria and hematuria.  Musculoskeletal:  Positive for arthralgias. Negative for myalgias.  Skin:  Negative for pallor and rash.  Neurological:  Negative for weakness and headaches.  Hematological:  Negative for adenopathy.     Past Medical History: Past Medical History:  Diagnosis Date   Allergy    Arthritis    Blood transfusion without reported diagnosis    around age 16 after major nausea secondary to prescribed MS Contin    GERD (gastroesophageal reflux disease)    Hyperlipidemia    Hypertension    Migraines    Ulcer 11/21/2008   Not actually an ulcer but stomach irritation to the point could not hold down food or take medication until treated. I take medication for Thalia and have not had the problem since   Wears glasses    From cardiology office note 11/25/2024: Jeremiah Anderson is a 59 y.o. male who presents for Jeremiah Anderson is a 59 y.o. male with a hx of hypertension, hyperlipidemia, GERD, arthritis, and chronic opioid use who is referred for evaluation of chest pain.     He was seen in the ED in the distant past for evaluation of chest pain.  This was not thought to be anginal.  He was back in the emergency room in December of this year with some epigastric discomfort.  He said he was traveling from [facility].[place]  He went through a drive-through at [employer] and he had some food and later on got pain under his mid sternum that radiated around his right side through to his back.  He had nausea and vomiting.  He went to the emergency room and I did review these records.  This was thought to be nonanginal.  However, there was a very minimally elevated troponin with readings of 33, 28, 26.  There were no acute EKG changes.  His symptoms resolved.  He has not had any further episodes of epigastric discomfort.  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  He denies any chest pressure, neck or  arm discomfort.  He has no new shortness of breath, PND or orthopnea.  He had no weight gain or edema.  He has not had any prior cardiac history.     Past Surgical History: Past Surgical History:  Procedure Laterality Date   APPENDECTOMY  1980s   post op bleeding sent him back for ex lap and hemostatic intervention.    BACK SURGERY     8 surgeries total - fused from L1-S1   BRAIN SURGERY     COLONOSCOPY     Age 68. For bleeding   ESOPHAGOGASTRODUODENOSCOPY N/A 06/10/2015   Procedure: ESOPHAGOGASTRODUODENOSCOPY (EGD);  Surgeon: Princella CHRISTELLA Nida, MD;  Location: [facility] ENDOSCOPY;  Service: Endoscopy;  Laterality: N/A;   ex lap with peri gastric vessel ligation  1990s   after retching, he rupture a blood vessel outside the stomach.  vessel was ligated per pt's descriiption.    EYE SURGERY  2018   Laser surgery to fix retina tears   HERNIA REPAIR  2019   Flank hernia right side from where surgeon cut open my side to do the back fusion surgery and it never  healed correctly   INCISIONAL HERNIA REPAIR Right 10/21/2016   Procedure: OPEN FLANK HERNIA REPAIR WITH MESH;  Surgeon: Lynda Leos, MD;  Location: [facility] OR;  Service: General;  Laterality: Right;   INSERTION OF MESH Right 10/21/2016   Procedure: INSERTION OF MESH;  Surgeon: Lynda Leos, MD;  Location: [facility] OR;  Service: General;  Laterality: Right;   JOINT REPLACEMENT     KNEE SURGERY     Multiple bilateral scopes   RETINAL DETACHMENT SURGERY Right    SHOULDER SURGERY     4x on the left - total shoulder replacement.    SHOULDER SURGERY Left    x2   SPINE SURGERY  First 1990   9 surgeries total, I am fused from S1-L1 and now have flat back syndrome   thumb surgery Right    TOTAL SHOULDER ARTHROPLASTY Left    x2     Family History: Family History  Problem Relation Age of Onset   Arthritis Mother    Hyperlipidemia Mother    Hypertension Father    Cancer Father    Skin cancer Father    Colon cancer Maternal Grandmother     Cancer Maternal Grandmother    Hypertension Sister    Hypertension Paternal Grandmother    Heart attack Paternal Grandfather    Heart disease Paternal Grandfather    Hypertension Paternal Grandfather    Diabetes Neg Hx    Stroke Neg Hx    Prostate cancer Neg Hx     Social History: Social History   Socioeconomic History   Marital status: Single    Spouse name: Not on file   Number of children: 0   Years of education: 16   Highest education level: Bachelor's degree (e.g., BA, AB, BS)  Occupational History   Occupation: Disability    Comment: Former [employer]  Tobacco Use   Smoking status: Former    Types: Cigarettes   Smokeless tobacco: Never  Vaping Use   Vaping status: Never Used  Substance and Sexual Activity   Alcohol use: Not Currently   Drug use: No   Sexual activity: Yes    Birth control/protection: Coitus interruptus, Condom, Diaphragm, Pill  Other Topics Concern   Not on file  Social History Narrative   Fun: play guitar / limited funds      Denies any religious beliefs effecting health care.    Social Drivers of Health   Tobacco Use: Medium Risk (12/26/2024)   Patient History    Smoking Tobacco Use: Former    Smokeless Tobacco Use: Never    Passive Exposure: Not on file  Financial Resource Strain: Low Risk (09/18/2024)   Overall Financial Resource Strain (CARDIA)    Difficulty of Paying Living Expenses: Not hard at all  Food Insecurity: No Food Insecurity (09/18/2024)   Epic    Worried About Programme Researcher, Broadcasting/film/video in the Last Year: Never true    Ran Out of Food in the Last Year: Never true  Transportation Needs: No Transportation Needs (09/18/2024)   Epic    Lack of Transportation (Medical): No    Lack of Transportation (Non-Medical): No  Physical Activity: Sufficiently Active (09/18/2024)   Exercise Vital Sign    Days of Exercise per Week: 4 days    Minutes of Exercise per Session: 60 min  Stress: No Stress Concern Present (09/18/2024)   [employer]  of Occupational Health - Occupational Stress Questionnaire    Feeling of Stress: Only a little  Social Connections: Moderately Isolated (09/18/2024)  Social Connection and Isolation Panel    Frequency of Communication with Friends and Family: More than three times a week    Frequency of Social Gatherings with Friends and Family: Once a week    Attends Religious Services: Patient declined    Active Member of Clubs or Organizations: No    Attends Engineer, Structural: Not on file    Marital Status: Living with partner  Depression (PHQ2-9): Low Risk (11/19/2024)   Depression (PHQ2-9)    PHQ-2 Score: 3  Alcohol Screen: Low Risk (09/14/2024)   Alcohol Screen    Last Alcohol Screening Score (AUDIT): 0  Housing: Low Risk (09/18/2024)   Epic    Unable to Pay for Housing in the Last Year: No    Number of Times Moved in the Last Year: 0    Homeless in the Last Year: No  Recent Concern: Housing - High Risk (07/16/2024)   Epic    Unable to Pay for Housing in the Last Year: Yes    Number of Times Moved in the Last Year: 0    Homeless in the Last Year: No  Utilities: Not At Risk (09/18/2024)   Epic    Threatened with loss of utilities: No  Health Literacy: Adequate Health Literacy (09/18/2024)   B1300 Health Literacy    Frequency of need for help with medical instructions: Never   Nurse with [place]  Allergies: Allergies[1]  Outpatient Meds: Current Outpatient Medications  Medication Sig Dispense Refill   amLODipine  (NORVASC ) 10 MG tablet Take 1 tablet (10 mg total) by mouth daily. 90 tablet 1   [START ON 01/05/2025] amphetamine -dextroamphetamine  (ADDERALL  XR) 20 MG 24 hr capsule Take 1 capsule (20 mg total) by mouth 2 (two) times daily. 60 capsule 0   [START ON 02/04/2025] amphetamine -dextroamphetamine  (ADDERALL  XR) 20 MG 24 hr capsule Take 1 capsule (20 mg total) by mouth 2 (two) times daily. 60 capsule 0   butalbital -acetaminophen -caffeine  (BAC, BUTALBITAL -ACETAMIN-CAFF,)  50-325-40 MG tablet Take 1 tablet by mouth every 6 (six) hours as needed. 120 tablet 1   Cholecalciferol (VITAMIN D3) 25 MCG (1000 UT) CAPS Take by mouth daily.     Coenzyme Q10 (COQ10) 100 MG CAPS Take 100 mg by mouth daily.     cyanocobalamin 100 MCG tablet Take 100 mcg by mouth daily.     fluorouracil (EFUDEX) 5 % cream Apply topically 2 (two) times daily.     fluticasone  (FLONASE ) 50 MCG/ACT nasal spray Place 2 sprays into both nostrils daily. 16 g 11   Lidocaine  4 % PTCH Place 1-2 patches onto the skin 2 (two) times daily as needed (for pain.). Salonpas Maximum Strength Pain Relieving Gel-Patch     meloxicam  (MOBIC ) 15 MG tablet Take 1 tablet (15 mg total) by mouth daily as needed. 30 tablet 2   morphine  (MSIR) 30 MG tablet Take 30 mg by mouth every 4 (four) hours as needed.     Multiple Vitamin (MULTIVITAMIN WITH MINERALS) TABS tablet Take 1 tablet by mouth daily.     naloxone  (NARCAN ) nasal spray 4 mg/0.1 mL Place into the nose.     ondansetron  (ZOFRAN ) 4 MG tablet TAKE 1 TABLET BY MOUTH EVERY 8 HOURS AS NEEDED FOR NAUSEA AND VOMITING 30 tablet 3   rosuvastatin  (CRESTOR ) 20 MG tablet Take 1 tablet (20 mg total) by mouth daily. 90 tablet 1   SUMAtriptan  (IMITREX ) 100 MG tablet MAY REPEAT IN 2 HOURS IF HEADACHE PERSISTS OR RECURS. 9 tablet 3   tazarotene (  TAZORAC) 0.1 % gel APPLY A SMALL AMOUNT TO AFFECTED AREA AT BEDTIME     testosterone  cypionate (DEPOTESTOSTERONE CYPIONATE) 200 MG/ML injection Inject 0.5 mLs (100 mg total) into the muscle once a week. 10 mL 1   Baclofen  5 MG TABS Take 1 tablet by mouth 3 (three) times daily. (Patient not taking: Reported on 12/26/2024)     butalbital -acetaminophen -caffeine  (BAC, BUTALBITAL -ACETAMIN-CAFF,) 50-325-40 MG tablet Take 1 tablet by mouth every six hours as needed (Patient not taking: Reported on 12/26/2024) 120 tablet 1   sucralfate  (CARAFATE ) 1 g tablet Take 1 tablet (1 g total) by mouth 4 (four) times daily -  with meals and at bedtime. (Patient not  taking: Reported on 12/26/2024) 60 tablet 0   No current facility-administered medications for this visit.      ___________________________________________________________________ Objective   Exam:  BP 130/82   Pulse 87   Ht 5' 9 (1.753 m)   Wt 178 lb 2 oz (80.8 kg)   BMI 26.30 kg/m  Wt Readings from Last 3 Encounters:  12/26/24 178 lb 2 oz (80.8 kg)  12/18/24 176 lb (79.8 kg)  11/25/24 177 lb (80.3 kg)    General: Well-appearing Eyes: sclera anicteric, no redness ENT: oral mucosa moist without lesions, no cervical or supraclavicular lymphadenopathy CV: Regular without appreciable murmur, no JVD, no peripheral edema Resp: clear to auscultation bilaterally, normal RR and effort noted GI: soft, no tenderness, with active bowel sounds. No guarding or palpable organomegaly noted.  No bruit Skin; warm and dry, no rash or jaundice noted Neuro: awake, alert and oriented x 3. Normal gross motor function and fluent speech   Labs:     Latest Ref Rng & Units 11/11/2024    8:55 PM 08/15/2024   11:05 AM 07/17/2024   11:30 AM  CBC  WBC 4.0 - 10.5 K/uL 10.9  8.2  8.0   Hemoglobin 13.0 - 17.0 g/dL 85.2  85.6  85.8   Hematocrit 39.0 - 52.0 % 43.8  40.8  40.5   Platelets 150 - 400 K/uL 273  277.0  273.0       Latest Ref Rng & Units 11/11/2024    8:55 PM 08/15/2024   11:05 AM 07/17/2024   11:30 AM  CMP  Glucose 70 - 99 mg/dL 99  895  97   BUN 6 - 20 mg/dL 24  19  17    Creatinine 0.61 - 1.24 mg/dL 8.88  8.86  9.10   Sodium 135 - 145 mmol/L 137  141  136   Potassium 3.5 - 5.1 mmol/L 4.0  4.9  4.0   Chloride 98 - 111 mmol/L 98  98  98   CO2 22 - 32 mmol/L 27  32  26   Calcium  8.9 - 10.3 mg/dL 9.7  89.9  9.3   Total Protein 6.5 - 8.1 g/dL 7.3  6.9  7.2   Total Bilirubin 0.0 - 1.2 mg/dL 0.5  0.4  0.5   Alkaline Phos 38 - 126 U/L 73  59  51   AST 15 - 41 U/L 36  29  45   ALT 0 - 44 U/L 40  30  35    EKG 11/15/2024 reviewed NSR, LAFB, normal QTc  Radiologic Studies:  CLINICAL  DATA:  Acute abdominal pain.   EXAM: CT ABDOMEN AND PELVIS WITH CONTRAST   TECHNIQUE: Multidetector CT imaging of the abdomen and pelvis was performed using the standard protocol following bolus administration of intravenous contrast.   RADIATION  DOSE REDUCTION: This exam was performed according to the departmental dose-optimization program which includes automated exposure control, adjustment of the mA and/or kV according to patient size and/or use of iterative reconstruction technique.   CONTRAST:  OMNIPAQUE  IOHEXOL  300 MG/ML  SOLN   COMPARISON:  CT 06/09/2023, ultrasound 09/16/2024, CT 01/01/2020   FINDINGS: Lower chest: The lung bases are clear.   Hepatobiliary: Tiny hypodensities in the liver are too small to characterize but likely small cysts. No suspicious liver lesion. Physiologically distended gallbladder. No gallstone or pericholecystic inflammation. The common bile duct is dilated at 12 mm. Normal tapering to the duodenal insertion.   Pancreas: No ductal dilatation or inflammation.   Spleen: Normal in size without focal abnormality.   Adrenals/Urinary Tract: Normal adrenal glands. No hydronephrosis or perinephric edema. No renal calculi. Right renal cyst. No further follow-up imaging is recommended. Homogeneous renal enhancement with symmetric excretion on delayed phase imaging. Urinary bladder is physiologically distended without wall thickening.   Stomach/Bowel: The stomach is decompressed. No bowel obstruction or inflammation. Appendectomy. Small to moderate colonic stool burden.   Vascular/Lymphatic: Aortic atherosclerosis. No aneurysm. Patent portal, splenic, and mesenteric veins. No lymphadenopathy.   Reproductive: Prostate is unremarkable.  Right inguinal testis.   Other: No free air, free fluid, or intra-abdominal fluid collection.   Musculoskeletal: Postsurgical and degenerative change throughout the spine.   IMPRESSION: 1. Right inguinal  testis. Recommend correlation with physical exam. This was not present on 2021 CT, and may be transient. 2. No other acute findings in the abdomen or pelvis. 3. Dilated common bile duct is a chronic finding. No abnormal gallbladder distension.   Aortic Atherosclerosis (ICD10-I70.0).     Electronically Signed   By: Andrea Gasman M.D.   On: 11/12/2024 01:13 _______________  CLINICAL DATA:  Epigastric pain x 5 days   EXAM: ABDOMEN ULTRASOUND COMPLETE   COMPARISON:  CT scan abdomen from 06/09/2023 and CT scan abdomen and pelvis from 01/01/2020.   FINDINGS: Gallbladder: No gallstones or wall thickening visualized. No sonographic Murphy sign noted by sonographer.   Common bile duct: Diameter: Up to 12 mm. Please note distal common bile duct is not visualized due to overlying bowel gas. Correlate clinically and with liver function tests to determine the need for additional imaging with MRI/MRCP. On the prior CT scan abdomen and pelvis from 01/01/2020, proximal extrahepatic bile duct measured up to 11 mm.   Liver: Normal liver echogenicity and echotexture. Incidental 8 x 9 x 11 mm cyst noted in the right hepatic lobe. Portal vein is patent on color Doppler imaging with normal direction of blood flow towards the liver.   IVC: No abnormality visualized.   Pancreas: Visualized portion unremarkable.   Spleen: Size and appearance within normal limits.   Right Kidney: Length: 10.5 cm. Echogenicity within normal limits. A renal cortical/sinus cyst noted measuring 1.7 x 2.0 x 2.1 cm. No suspicious mass or hydronephrosis visualized.   Left Kidney: Length: 12.3 cm. Echogenicity within normal limits. No mass or hydronephrosis visualized.   Abdominal aorta: No aneurysm visualized.   Other findings: None.   IMPRESSION: 1. No cholelithiasis or sonographic evidence of acute cholecystitis. 2. Dilated common bile duct measuring up to 12 mm, findings are grossly unchanged since  multiple prior studies and likely physiologic. Correlate clinically and with liver function tests to determine the need for additional imaging with MRI/MRCP 3. Multiple other nonacute observations, as described above.     Electronically Signed   By: Ree Kimberlee Anderson.D.  On: 07/17/2024 16:34     Encounter Diagnoses  Name Primary?   Epigastric pain Yes   Nausea and vomiting in adult     Assessment & Plan Recurrent vomiting, though suspect December episode was likely acute illness such as food poisoning. Still has chronic underlying intermittent symptoms with improvement of the acute symptoms; chronic opioid therapy seems most likely explanation for underlying chronic upper digestive issues and there could be an element of delayed gastric emptying with that.  Previous history of NSAID induced gastric ulcer, now he is back on NSAIDs.  The acute worsening of symptoms in December speaks against that episode being attributed to ulcer. - Recommended EGD to evaluate for gastroparesis and mucosal disease. - Instructed clear liquid diet after breakfast the day prior to EGD.  (To decrease the chance of retained food in the stomach that could limit mucosal visualization or lead to aspiration) -He was agreeable to EGD after discussion of procedure and risks.  The benefits and risks of the planned procedure(s) were described in detail with the patient or (when appropriate) their health care proxy.  Risks were outlined as including, but not limited to, bleeding, infection, perforation, adverse medication reaction leading to cardiac or pulmonary decompensation, pancreatitis (if ERCP).  The limitation of incomplete mucosal visualization was also discussed.  No guarantees or warranties were given.   History of peptic ulcer disease NSAID-induced peptic ulcer disease with recent symptom recurrence; intermittent meloxicam  use raises concern for recurrence.  (Even if it did not lead to the recent episode) -  Planned EGD to assess for recurrence. - Discussed possible escalation to stronger antacid and reconsideration of meloxicam  use if ulcers identified.    Thank you for the courtesy of this consult.  Please call me with any questions or concerns.  Jeremiah Anderson  CC: Referring provider noted above     [1]  Allergies Allergen Reactions   Lisinopril Swelling and Anaphylaxis   Fentanyl  Other (See Comments)    Hallucinations (with the patch)  Other Reaction(s): Other (See Comments)  Hears voice   Fentanyl  Patch   Metoprolol  Other (See Comments)   Sulfa Antibiotics Swelling   Pregabalin Other (See Comments)    hallucinations  pregabalin   "

## 2024-12-26 NOTE — Patient Instructions (Signed)
 You have been scheduled for an endoscopy. Please follow written instructions given to you at your visit today.  If you use inhalers (even only as needed), please bring them with you on the day of your procedure.  If you take any of the following medications, they will need to be adjusted prior to your procedure:   DO NOT TAKE 7 DAYS PRIOR TO TEST- Trulicity (dulaglutide) Ozempic, Wegovy (semaglutide) Mounjaro, Zepbound (tirzepatide) Bydureon Bcise (exanatide extended release)  DO NOT TAKE 1 DAY PRIOR TO YOUR TEST Rybelsus (semaglutide) Adlyxin (lixisenatide) Victoza (liraglutide) Byetta (exanatide) ___________________________________________________________________________  Thank you for trusting me with your gastrointestinal care!    Dr. Victory Legrand [facility]

## 2024-12-31 ENCOUNTER — Encounter: Admitting: Gastroenterology

## 2025-01-29 ENCOUNTER — Ambulatory Visit: Admitting: Family Medicine

## 2025-02-12 ENCOUNTER — Ambulatory Visit: Admitting: Physician Assistant

## 2025-09-29 ENCOUNTER — Ambulatory Visit
